# Patient Record
Sex: Male | Born: 1935 | Race: White | Hispanic: No | Marital: Married | Smoking: Never smoker
Health system: Southern US, Community
[De-identification: ages and names within clinical notes are randomized; demographics above are authoritative.]

## PROBLEM LIST (undated history)

## (undated) DIAGNOSIS — J45909 Unspecified asthma, uncomplicated: Secondary | ICD-10-CM

## (undated) DIAGNOSIS — R0602 Shortness of breath: Secondary | ICD-10-CM

## (undated) DIAGNOSIS — I219 Acute myocardial infarction, unspecified: Secondary | ICD-10-CM

## (undated) DIAGNOSIS — I509 Heart failure, unspecified: Secondary | ICD-10-CM

## (undated) DIAGNOSIS — E785 Hyperlipidemia, unspecified: Secondary | ICD-10-CM

## (undated) DIAGNOSIS — I2510 Atherosclerotic heart disease of native coronary artery without angina pectoris: Secondary | ICD-10-CM

## (undated) DIAGNOSIS — J189 Pneumonia, unspecified organism: Secondary | ICD-10-CM

## (undated) DIAGNOSIS — H269 Unspecified cataract: Secondary | ICD-10-CM

## (undated) DIAGNOSIS — T7840XA Allergy, unspecified, initial encounter: Secondary | ICD-10-CM

## (undated) DIAGNOSIS — L719 Rosacea, unspecified: Secondary | ICD-10-CM

## (undated) DIAGNOSIS — M1990 Unspecified osteoarthritis, unspecified site: Secondary | ICD-10-CM

## (undated) HISTORY — DX: Allergy, unspecified, initial encounter: T78.40XA

## (undated) HISTORY — PX: APPENDECTOMY: SHX54

## (undated) HISTORY — DX: Unspecified cataract: H26.9

## (undated) HISTORY — DX: Heart failure, unspecified: I50.9

## (undated) HISTORY — DX: Atherosclerotic heart disease of native coronary artery without angina pectoris: I25.10

## (undated) HISTORY — DX: Hyperlipidemia, unspecified: E78.5

## (undated) HISTORY — DX: Unspecified asthma, uncomplicated: J45.909

## (undated) HISTORY — PX: OTHER SURGICAL HISTORY: SHX169

## (undated) HISTORY — PX: EYE SURGERY: SHX253

---

## 1998-11-30 ENCOUNTER — Ambulatory Visit (HOSPITAL_COMMUNITY): Admission: RE | Admit: 1998-11-30 | Discharge: 1998-11-30 | Payer: Self-pay | Admitting: Gastroenterology

## 2004-01-13 ENCOUNTER — Ambulatory Visit (HOSPITAL_COMMUNITY): Admission: RE | Admit: 2004-01-13 | Discharge: 2004-01-13 | Payer: Self-pay | Admitting: Gastroenterology

## 2004-01-13 ENCOUNTER — Encounter (INDEPENDENT_AMBULATORY_CARE_PROVIDER_SITE_OTHER): Payer: Self-pay | Admitting: Specialist

## 2007-12-02 ENCOUNTER — Inpatient Hospital Stay (HOSPITAL_COMMUNITY): Admission: EM | Admit: 2007-12-02 | Discharge: 2007-12-05 | Payer: Self-pay | Admitting: Emergency Medicine

## 2007-12-16 HISTORY — PX: OTHER SURGICAL HISTORY: SHX169

## 2007-12-22 ENCOUNTER — Emergency Department (HOSPITAL_COMMUNITY): Admission: EM | Admit: 2007-12-22 | Discharge: 2007-12-22 | Payer: Self-pay | Admitting: Emergency Medicine

## 2007-12-24 ENCOUNTER — Inpatient Hospital Stay (HOSPITAL_BASED_OUTPATIENT_CLINIC_OR_DEPARTMENT_OTHER): Admission: RE | Admit: 2007-12-24 | Discharge: 2007-12-24 | Payer: Self-pay | Admitting: Cardiovascular Disease

## 2007-12-27 HISTORY — PX: US ECHOCARDIOGRAPHY: HXRAD669

## 2009-02-23 HISTORY — PX: US ECHOCARDIOGRAPHY: HXRAD669

## 2009-11-14 DIAGNOSIS — I509 Heart failure, unspecified: Secondary | ICD-10-CM

## 2009-11-14 HISTORY — PX: ELECTROCARDIOGRAM: SHX264

## 2009-11-14 HISTORY — DX: Heart failure, unspecified: I50.9

## 2009-11-14 HISTORY — PX: CARDIAC CATHETERIZATION: SHX172

## 2009-12-03 ENCOUNTER — Ambulatory Visit (HOSPITAL_COMMUNITY): Admission: RE | Admit: 2009-12-03 | Discharge: 2009-12-03 | Payer: Self-pay | Admitting: Cardiovascular Disease

## 2010-07-06 ENCOUNTER — Ambulatory Visit
Admission: RE | Admit: 2010-07-06 | Discharge: 2010-07-06 | Payer: Self-pay | Source: Home / Self Care | Attending: Specialist | Admitting: Specialist

## 2010-07-22 ENCOUNTER — Ambulatory Visit: Payer: Self-pay | Admitting: Cardiovascular Disease

## 2010-07-27 ENCOUNTER — Ambulatory Visit
Admission: RE | Admit: 2010-07-27 | Discharge: 2010-07-27 | Payer: Self-pay | Source: Home / Self Care | Attending: Specialist | Admitting: Specialist

## 2010-08-01 LAB — POCT I-STAT 4, (NA,K, GLUC, HGB,HCT)
Glucose, Bld: 97 mg/dL (ref 70–99)
HCT: 44 % (ref 39.0–52.0)
Hemoglobin: 15 g/dL (ref 13.0–17.0)
Potassium: 4.4 mEq/L (ref 3.5–5.1)
Sodium: 140 mEq/L (ref 135–145)

## 2010-09-26 LAB — POCT I-STAT, CHEM 8
BUN: 28 mg/dL — ABNORMAL HIGH (ref 6–23)
Calcium, Ion: 1.26 mmol/L (ref 1.12–1.32)
Chloride: 103 mEq/L (ref 96–112)
Creatinine, Ser: 1 mg/dL (ref 0.4–1.5)
Glucose, Bld: 97 mg/dL (ref 70–99)
HCT: 46 % (ref 39.0–52.0)
Hemoglobin: 15.6 g/dL (ref 13.0–17.0)
Potassium: 4.9 mEq/L (ref 3.5–5.1)
Sodium: 137 mEq/L (ref 135–145)
TCO2: 28 mmol/L (ref 0–100)

## 2010-10-26 ENCOUNTER — Encounter: Payer: Self-pay | Admitting: Cardiovascular Disease

## 2010-10-27 ENCOUNTER — Other Ambulatory Visit: Payer: Self-pay | Admitting: Cardiovascular Disease

## 2010-10-27 ENCOUNTER — Encounter: Payer: Self-pay | Admitting: Cardiovascular Disease

## 2010-10-27 ENCOUNTER — Ambulatory Visit (INDEPENDENT_AMBULATORY_CARE_PROVIDER_SITE_OTHER): Payer: Medicare Other | Admitting: Cardiovascular Disease

## 2010-10-27 VITALS — BP 122/68 | HR 60 | Resp 18 | Wt 197.8 lb

## 2010-10-27 DIAGNOSIS — I509 Heart failure, unspecified: Secondary | ICD-10-CM | POA: Insufficient documentation

## 2010-10-27 DIAGNOSIS — I2510 Atherosclerotic heart disease of native coronary artery without angina pectoris: Secondary | ICD-10-CM | POA: Insufficient documentation

## 2010-10-27 DIAGNOSIS — E785 Hyperlipidemia, unspecified: Secondary | ICD-10-CM | POA: Insufficient documentation

## 2010-10-27 MED ORDER — ATORVASTATIN CALCIUM 40 MG PO TABS: 40.0000 mg | ORAL_TABLET | Freq: Every day | ORAL | Status: DC

## 2010-10-27 NOTE — Patient Instructions (Signed)
3 months visit for blood work ( lipids, liver enzymes, and electrolytes) 6 months for office visit.

## 2010-10-27 NOTE — Assessment & Plan Note (Signed)
Jeffrey Stewart is doing very well from a cardiac standpoint. I'm pleased that she's not having any episodes of angina.

## 2010-10-27 NOTE — Assessment & Plan Note (Signed)
He'll continue with his same medications including carvedilol and lisinopril. I'll see him again in 6 months for followup visit.

## 2010-10-27 NOTE — Telephone Encounter (Signed)
meds refilled by fax to [facility]. Carvedilol, lisinopril, atorvastatin. Alfonso Ramus RN

## 2010-10-27 NOTE — Progress Notes (Signed)
Jeffrey Stewart Date of Birth  [birth year] [facility] / [facility] [address]     [address] [place], [place]  [address] [phone]  Fax  [phone]   History of Present Illness:  Jeffrey Stewart is an elderly gentleman with a history of mild to moderate coronary artery disease and very mild congestive heart failure. He has not had any episodes of chest pain or shortness of breath. His ejection fraction is around 45-50%. He denies any syncope or presyncope. He also has a history of hyperlipidemia.  Current Outpatient Prescriptions on File Prior to Visit  Medication Sig Dispense Refill  . acetaminophen (TYLENOL) 325 MG tablet Take 650 mg by mouth every 6 (six) hours as needed.        Marland Kitchen aspirin 325 MG tablet Take 325 mg by mouth daily.        . carvedilol (COREG) 12.5 MG tablet Take 12.5 mg by mouth 2 (two) times daily with a meal.        . cetirizine (ZYRTEC) 10 MG tablet Take 10 mg by mouth as needed.        . DiphenhydrAMINE HCl (BENADRYL PO) Take by mouth as needed.        . fish oil-omega-3 fatty acids 1000 MG capsule Take 3 g by mouth 3 (three) times daily.        Marland Kitchen GLUCOSAMINE PO Take 1 tablet by mouth 3 (three) times daily.       Marland Kitchen lisinopril (PRINIVIL,ZESTRIL) 5 MG tablet Take 5 mg by mouth daily.        . mometasone (ASMANEX) 220 MCG/INH inhaler Inhale 1 puff into the lungs daily.        . nitroGLYCERIN (NITROSTAT) 0.4 MG SL tablet Place 0.4 mg under the tongue every 5 (five) minutes as needed.        . simvastatin (ZOCOR) 40 MG tablet Take 40 mg by mouth at bedtime.        Marland Kitchen tetracycline (ACHROMYCIN,SUMYCIN) 500 MG capsule Take 500 mg by mouth 2 (two) times daily.          No Known Allergies  Past Medical History  Diagnosis Date  . Coronary artery disease   . CHF (congestive heart failure) 11/2009    EF45-50%    Past Surgical History  Procedure Date  . Cardiac catheterization 11/2009    mild / mod cad,  . Last echo 02/2009   . Last nuc 2009   .  Electrocardiogram 11/2009    RBBB    History  Smoking status  . Never Smoker   Smokeless tobacco  . Not on file    History  Alcohol Use No    History reviewed. No pertinent family history.  Reviw of Systems:  Reviewed in the history of present illness. All other systems are negative. Physical Exam: BP 122/68  Pulse 60  Resp 18  Wt 197 lb 12.8 oz (89.721 kg) The patient is alert and oriented x 3.  The mood and affect are normal.  The skin is warm and dry.  Color is normal.  The HEENT exam reveals that the sclera are nonicteric.  The mucous membranes are moist.  The carotids are 2+ without bruits.  There is no thyromegaly.  There is no JVD.  The lungs are clear.  The chest wall is non tender.  The heart exam reveals a regular rate with a normal S1 and S2.  There are no murmurs, gallops, or rubs.  The  PMI is not displaced.   Abdominal exam reveals good bowel sounds.  There is no guarding or rebound.  There is no hepatosplenomegaly or tenderness.  There are no masses.  Exam of the legs reveal no clubbing, cyanosis, or edema.  The legs are without rashes.  The distal pulses are intact.  Cranial nerves II - XII are intact.  Motor and sensory functions are intact.  The gait is normal.  Assessment / Plan:

## 2010-10-27 NOTE — Assessment & Plan Note (Signed)
I prefer to have him on atorvastatin 40 mg a day. We'll discontinue the simvastatin therapy and we will write him a new prescription for atorvastatin. We'll check his lipids, basic metabolic profile, and hepatic profile in 3 months.

## 2010-11-29 NOTE — H&P (Signed)
NAMEGEE, HABIG NO.:  [id number]   MEDICAL RECORD NO.:  [id number]          PATIENT TYPE:  INP   LOCATION:  4714                         FACILITY:  [facility]   PHYSICIAN:  Della Goo, M.D. DATE OF BIRTH:  [birth year]   DATE OF ADMISSION:  12/01/2007  DATE OF DISCHARGE:                              HISTORY & PHYSICAL   PRIMARY CARE PHYSICIAN:  [facility], Dr. Larina Bras.   CHIEF COMPLAINTS:  Shortness of breath and chest pain.   HISTORY OF PRESENT ILLNESS:  This is a 76 year old male who presents to  the emergency department with complaints of increasing shortness of  breath, fever, chills and pleuritic chest pain since the afternoon.  He  reports being seen by his primary care physician earlier in the week  four days ago secondary to symptoms he had been having and was placed on  antibiotic therapy of Augmentin with meclizine therapy for treatment of  an inner ear infection along with mild bronchitis.  The patient denies  having any cough or cough production.  He also reports being on  antibiotic therapy chronically with tetracycline for his rosacea.   PAST MEDICAL HISTORY:  Significant for hyperlipidemia and rosacea.   PAST SURGICAL HISTORY:  History of an appendectomy.   His medications at this time include tetracycline daily.  Also, he had  been placed on Augmentin 875/125 mg 1 p.o. b.i.d. and meclizine therapy  p.r.n. dizziness.  He also takes aspirin therapy and Zocor.   ALLERGIES:  No known drug allergies.   SOCIAL HISTORY:  The patient is married.  He is a nonsmoker, nondrinker.   FAMILY HISTORY:  Noncontributory.   REVIEW OF SYSTEMS:  Pertinents are mentioned above.   PHYSICAL EXAMINATION FINDINGS:  This is a 76 year old, well-developed  male in no acute distress.  His temperature is 100.1, blood pressure  129/71, heart rate 112 and respirations 18.  His O2 saturations are 94-  96% on room air.  HEENT EXAMINATION:   Normocephalic, atraumatic.  Pupils are equally  round, reactive to light.  Extraocular muscles are intact.  Funduscopic  benign.  Oropharynx is clear.  NECK:  Is supple, full range of motion.  No thyromegaly, adenopathy, or  jugular venous distention.  CARDIOVASCULAR:  Regular rate and rhythm.  Mild tachycardia.  No  murmurs, gallops or rubs.  LUNGS:  Clear to auscultation bilaterally.  ABDOMEN:  Positive bowel sounds, soft, nontender, nondistended.  EXTREMITIES:  Without cyanosis, clubbing or edema.  NEUROLOGIC EXAMINATION:  Nonfocal.   LABORATORY STUDIES:  White blood cell count 8.1, hemoglobin 14.4,  hematocrit 42.2, platelets 118, neutrophils 77% lymphocytes 11%.  Sodium  136, potassium 4.2, chloride 101, bicarb 25, BUN 14, creatinine 1.06 and  glucose 138.  Urinalysis with moderate hemoglobin.  Chest x-ray reveals  bibasilar atelectasis and a questionable pulmonary nodule along with  right pleural thickening.   ASSESSMENT:  A 76 year old male being admitted with:  1. Atypical pneumonia.  2. Pleuritic chest pain.  3. Mild hypoxia.  4. Pulmonary nodule on chest x-ray.   PLAN:  The patient will be admitted to  the telemetry area for cardiac  monitoring, and cardiac enzymes will be started.  The patient will be  started on IV antibiotic therapy of Rocephin and azithromycin.  Nebulizer treatments have also been ordered.  A CT angiogram of the  chest will be ordered now to evaluate for pulmonary embolism secondary  to pleuritic chest pain but also to further evaluate the pulmonary  nodule.  DVT and GI prophylaxis have been ordered.      Della Goo, M.D.  Electronically Signed     HJ/MEDQ  D:  12/02/2007  T:  12/02/2007  Job:  [id number]   cc:   Gloriajean Dell. Andrey Campanile, M.D.

## 2010-11-29 NOTE — Cardiovascular Report (Signed)
NAMETYREAK, REAGLE NO.:  [id number]   MEDICAL RECORD NO.:  [id number]          PATIENT TYPE:  OIB   LOCATION:  1967                         FACILITY:  [facility]   PHYSICIAN:  Vesta Mixer, M.D. DATE OF BIRTH:  Sep 04, 1934   DATE OF PROCEDURE:  DATE OF DISCHARGE:  12/24/2007                            CARDIAC CATHETERIZATION   Jeffrey Stewart is a 76 year old gentleman with a history of moderate  coronary artery disease and congestive heart failure.  He was recently  seen for some episodes of chest pain and shortness breath.  He presented  to the emergency room and ruled out for myocardial infarction.  He is  scheduled for heart catheterization for further evaluation.   The procedure was left heart catheterization with coronary angiography.   The right femoral artery was easily cannulated using modified Seldinger  technique.   HEMODYNAMICS:  LV pressure is 111/16 with an aortic pressure of 116/36.   Angiography left main, the left main is relatively normal.   The left anterior descending artery is moderately calcified.  There is a  mid 40%-50% stenosis right at the takeoff of the first diagonal artery.  The remaining mid and distal LAD had minor luminal irregularities.   The first diagonal artery has a 50% stenosis at its origin.   The left circumflex artery is large and is dominant.  There are minor  luminal irregularities. The obtuse marginal arteries in the  posterolateral branches are normal.  The posterior descending artery has  an 80-90% stenosis.   The ramus intermediate vessel has minor luminal irregularities.   The right coronary artery is small, it is nondominant.   The left ventriculogram was performed in a 30 RAO position.  Reveals a  moderately reduced left ventricular systolic function.  Ejection  fraction is around 35-40%.  There is hypokinesis of the anterior wall,  apex, and inferoapical wall.  The aortic root appears to be moderately  dilated.   COMPLICATIONS:  None.   CONCLUSION:  1. Moderate coronary artery disease.  He has CAD involving the mid-LAD      in the proximal posterior descending artery (from a dominant left      circumflex).  He certainly may be having some ischemia from this      posterior      descending artery stenosis, although it certainly is not the cause      of his moderately reduced left ventricular systolic function.  We      will evaluate him for other problems.  We will need to get an      echocardiogram to evaluate his valvular function.  We will consider      doing angioplasty of the posterior descending artery.           ______________________________  Vesta Mixer, M.D.     PJN/MEDQ  D:  12/24/2007  T:  12/25/2007  Job:  [id number]   cc:   Ace Gins, MD

## 2010-11-29 NOTE — Discharge Summary (Signed)
Jeffrey Stewart, Jeffrey Stewart NO.:  [id number]   MEDICAL RECORD NO.:  [id number]           PATIENT TYPE:   LOCATION:                                 FACILITY:   PHYSICIAN:  Hettie Holstein, D.O.    DATE OF BIRTH:  1934/08/17   DATE OF ADMISSION:  DATE OF DISCHARGE:                               DISCHARGE SUMMARY   PRIMARY CARE PHYSICIAN:  Ace Gins, MD.   FINAL DIAGNOSES:  1. Community-acquired pneumonia status post chest x-ray and CT      angiogram of the chest revealing some focal consolidation in the      left lung base.  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  He had no evidence of central pulmonary      emboli on the study performed, though was not an optimal      examination.  I discussed this further with Dr. Denny Levy of      radiology.  2. Pleuritic chest discomfort, resolved, felt to be related to      community-acquired pneumonia, anticipate completion of an 8-day      course and close follow up in the outpatient setting.  3. Abnormal EKG on admission.  This was followed.  His cardiac markers      were negative and not indicate an acute ischemic event.  He did      have a right bundle branch block.  At this juncture, it is      recommended that he undergo an outpatient stress test for further      risk gratification.  4. Mild thrombocytopenia, felt to be related to acute infectious      process and recommend follow up within the next few weeks to assure      resolution through primary care physician.   LABORATORY DATA:  Basic metabolic panel revealed sodium 137, potassium  4.3, BUN 8, and creatinine 1.07.  His troponins were negative.  WBC of  5.0, hemoglobin 13.4, MCV of 97.1, and a platelet count of 146.   MEDICATIONS ON DISCHARGE:  Jeffrey Stewart is being discharged to complete a  day course of antibiotics for community-acquired pneumonia.  He will be  transitioned from IV Zithromax and Rocephin to p.o. Avelox 400 mg  p.o.  daily.  He can resume his home medications including Zocor 40 mg daily,  tetracycline for rosacea which he takes twice daily, aspirin 325 mg  daily, Tylenol as needed, glucosamine he can resume as he was prior to  arrival, and amoxicillin he can discontinue.   DISPOSITION:  Jeffrey Stewart is improved in stable medical condition,  currently pain free, ambulating in the halls without difficulty.  He is  in sinus rhythm and hemodynamically stable for follow up with his  primary care physician within 1 week.  History of presenting illness for  full details, please refer to the H&P as dictated by Dr. Della Goo; however briefly, Jeffrey Stewart is a 76 year old male presented to  the emergency department with complaints of increasing shortness of  breath, fever, chills, and pleuritic pain.  He reported being seen by  his primary MD earlier and was placed on Augmentin and meclizine.  In  any event, he denied any cough, fever, or chills.  He was noted to be  chronically on tetracycline for rosacea.  In the emergency department,  chest x-ray revealed some bibasilar atelectasis and some right pleural  thickening.  Subsequently, he underwent CT scan that revealed some  consolidation of the left lung felt to be by community-acquired  pneumonia.  He was placed on IV Zithromax and  Rocephin with resolution of his symptoms.  Cardiac markers were not  indicative of any acute ischemic event.  He is ambulating without  difficulty maintaining O2 saturations greater than 92% with activity.  He was asked to follow up with his primary MD.      Hettie Holstein, D.O.  Electronically Signed     ESS/MEDQ  D:  12/05/2007  T:  12/05/2007  Job:  [id number]   cc:   Ace Gins, MD

## 2010-11-29 NOTE — H&P (Signed)
NAMECHETT, TANIGUCHI NO.:  [id number]   MEDICAL RECORD NO.:  [id number]          PATIENT TYPE:   LOCATION:                                 FACILITY:   PHYSICIAN:  Vesta Mixer, M.D. DATE OF BIRTH:  1934-10-01   DATE OF ADMISSION:  DATE OF DISCHARGE:                              HISTORY & PHYSICAL   Jeffrey Stewart is an elderly gentleman with a history of an abnormal EKG.  He is admitted now for heart catheterization.   Jeffrey Stewart was seen previously for a stress test.  He has had some  episodes of chest tightness and chest fullness.  He has also had a  pleuritic chest pain.  He was just getting over pneumonia and in fact  was hospitalized for pneumonia last month.  He was found to have an  abnormal EKG and is referred here for further evaluation.  A stress  Cardiolite study revealed an anteroapical defect.  He was also found to  have moderately reduced left ventricular systolic function with an EF of  33%.  He is now referred for heart catheterization for further  evaluation.   CURRENT MEDICATIONS:  1. Aspirin 325 mg a day.  2. Zyrtec once a day.  3. Glucosamine once a day.  4. Simvastatin 40 mg a day.  5. Tetracycline once a day.  6. Tylenol as needed.   ALLERGIES:  None.   PAST MEDICAL HISTORY:  1. History of hyperlipidemia.  2. Allergies.   SOCIAL HISTORY:  The patient is a nonsmoker.   FAMILY HISTORY:  Noncontributory.   REVIEW OF SYSTEMS:  Is reviewed and is essentially negative except as  noted in the HPI.   PHYSICAL EXAMINATION:  GENERAL:  He is a middle-aged gentleman in no  acute distress.  He is alert and oriented x3, and his mood and affect  are normal.  VITAL SIGNS:  His weight is 203, blood pressure is 100/60  with a heart rate of 104.  HEENT:  Reveals 2+ carotids.  He has no bruits, no JVD, no thyromegaly.  LUNGS:  Clear to auscultation.  HEART:  Regular rate, S1, S2.  ABDOMEN:  Reveals good bowel sounds and is nontender.  EXTREMITIES:  There is no clubbing, cyanosis, or edema.  NEUROLOGICAL:  Nonfocal.   Jeffrey Stewart presents with some episodes of chest discomfort.  He has an  abnormal stress Cardiolite study, which is somewhat worrisome.  His left  ventricular systolic function is moderately reduced.  We have elected to  perform an outpatient heart catheterization.  We have discussed the  risks, benefits, and options of heart catheterization.  He understands  and agrees to proceed.           ______________________________  Vesta Mixer, M.D.     PJN/MEDQ  D:  12/18/2007  T:  12/19/2007  Job:  [id number]   cc:   Ace Gins, MD

## 2010-12-02 NOTE — Op Note (Signed)
Jeffrey Stewart, Jeffrey Stewart                          ACCOUNT NO.:  [id number]   MEDICAL RECORD NO.:  [id number]                   PATIENT TYPE:  AMB   LOCATION:  ENDO                                 [facility]ACILITY:  [facility]   PHYSICIAN:  Petra Kuba, M.D.                 DATE OF BIRTH:  Feb 23, 1935   DATE OF PROCEDURE:  01/13/2004  DATE OF DISCHARGE:                                 OPERATIVE REPORT   PROCEDURE:  Colonoscopy with polypectomy.   INDICATION:  Patient with history of colon polyps, due for colonic  screening.  Consent was signed after risks, benefits, methods, options  thoroughly discussed multiple times in the past.   MEDICINES USED:  1. Demerol 90.  2. Versed 8.5.   PROCEDURE:  Rectal inspection is pertinent for external hemorrhoids.  Digital exam negative.  Video colonoscope was inserted, easily advanced  around the colon to the cecum.  This does not require any abdominal pressure  or any position changes.  No obvious abnormality was seen on insertion.  The  cecum was identified by the appendiceal orifice and the ileocecal valve.  The scope was slowly withdrawn.  Prep was adequate.  There was some liquid  stool that required washing and sectioning on slow withdrawal through the  colon.  The cecum, ascending, transverse and majority of the descending was  normal.  At the proximal level of the sigmoid, descending junction a tiny  polyp was seen and was cold biopsied x2.  The scope was further withdrawn  back to the rectum and a small polyp was seen, possibly recurrence of  residual polyp from the previous one biopsied, since there seemed to be a  white scar on the colon just next to this, possibly from previous  polypectomy and this polyp was snared, electrocautery applied and the polyp  was suctioned through the scope and collected in the trap.  There was no  obvious residual polypoid tissue seen but a nice white coagulum at the base.  Anorectal pull through on retroflexion  confirmed the hemorrhoids.  The scope  was reinserted a short ways up the left side of the colon, air was  suctioned, scope removed.  The patient tolerated the procedure well.  There  was no obvious immediate complication.   ENDOSCOPIC DIAGNOSES:  1. Internal, external hemorrhoids.  2. Small rectal polyp, snared.  3. Tiny proximal sigmoid polyp cold biopsied.  4. Otherwise within normal limits to the cecum.   PLAN:  1. Await pathology to determine future colonic screening.  2. Happy to see him back p.r.n.  3. Otherwise, return care to Dr. Arlyce Dice for the customary healthcare     maintenance to include yearly rectals and guaiacs.  Petra Kuba, M.D.    MEM/MEDQ  D:  01/13/2004  T:  01/13/2004  Job:  [id number]   cc:   Teena Irani. Arlyce Dice, M.D.  [address]  [place]  [place] [address]  Fax: [phone]

## 2010-12-18 ENCOUNTER — Emergency Department (HOSPITAL_COMMUNITY)
Admission: EM | Admit: 2010-12-18 | Discharge: 2010-12-19 | Disposition: A | Discharge status: Home / Self Care | Payer: Medicare Other | Attending: Emergency Medicine | Admitting: Emergency Medicine

## 2010-12-18 DIAGNOSIS — S7010XA Contusion of unspecified thigh, initial encounter: Secondary | ICD-10-CM | POA: Insufficient documentation

## 2010-12-18 DIAGNOSIS — Z79899 Other long term (current) drug therapy: Secondary | ICD-10-CM | POA: Insufficient documentation

## 2010-12-18 DIAGNOSIS — Y929 Unspecified place or not applicable: Secondary | ICD-10-CM | POA: Insufficient documentation

## 2010-12-18 DIAGNOSIS — IMO0001 Reserved for inherently not codable concepts without codable children: Secondary | ICD-10-CM | POA: Insufficient documentation

## 2010-12-18 DIAGNOSIS — I998 Other disorder of circulatory system: Secondary | ICD-10-CM | POA: Insufficient documentation

## 2010-12-18 DIAGNOSIS — E785 Hyperlipidemia, unspecified: Secondary | ICD-10-CM | POA: Insufficient documentation

## 2010-12-18 DIAGNOSIS — S8010XA Contusion of unspecified lower leg, initial encounter: Secondary | ICD-10-CM | POA: Insufficient documentation

## 2010-12-18 DIAGNOSIS — M25559 Pain in unspecified hip: Secondary | ICD-10-CM | POA: Insufficient documentation

## 2010-12-18 DIAGNOSIS — R109 Unspecified abdominal pain: Secondary | ICD-10-CM | POA: Insufficient documentation

## 2010-12-18 DIAGNOSIS — W138XXA Fall from, out of or through other building or structure, initial encounter: Secondary | ICD-10-CM | POA: Insufficient documentation

## 2010-12-18 DIAGNOSIS — R609 Edema, unspecified: Secondary | ICD-10-CM | POA: Insufficient documentation

## 2010-12-19 ENCOUNTER — Emergency Department (HOSPITAL_COMMUNITY): Payer: Medicare Other

## 2011-01-20 ENCOUNTER — Other Ambulatory Visit: Payer: Self-pay | Admitting: *Deleted

## 2011-01-20 DIAGNOSIS — E785 Hyperlipidemia, unspecified: Secondary | ICD-10-CM

## 2011-01-26 ENCOUNTER — Other Ambulatory Visit (INDEPENDENT_AMBULATORY_CARE_PROVIDER_SITE_OTHER): Payer: Medicare Other | Admitting: *Deleted

## 2011-01-26 DIAGNOSIS — E785 Hyperlipidemia, unspecified: Secondary | ICD-10-CM

## 2011-01-26 LAB — HEPATIC FUNCTION PANEL
ALT: 16 U/L (ref 0–53)
AST: 18 U/L (ref 0–37)
Albumin: 4.5 g/dL (ref 3.5–5.2)
Alkaline Phosphatase: 63 U/L (ref 39–117)
Bilirubin, Direct: 0.2 mg/dL (ref 0.0–0.3)
Total Bilirubin: 1.2 mg/dL (ref 0.3–1.2)
Total Protein: 6.7 g/dL (ref 6.0–8.3)

## 2011-01-26 LAB — BASIC METABOLIC PANEL
BUN: 19 mg/dL (ref 6–23)
CO2: 29 mEq/L (ref 19–32)
Calcium: 9.2 mg/dL (ref 8.4–10.5)
Chloride: 98 mEq/L (ref 96–112)
Creatinine, Ser: 0.9 mg/dL (ref 0.4–1.5)
GFR: 84.45 mL/min (ref >60.00)
Glucose, Bld: 101 mg/dL — ABNORMAL HIGH (ref 70–99)
Potassium: 4.1 mEq/L (ref 3.5–5.1)
Sodium: 138 mEq/L (ref 135–145)

## 2011-01-26 LAB — LIPID PANEL
Cholesterol: 107 mg/dL (ref 0–200)
HDL: 28.9 mg/dL — ABNORMAL LOW (ref >39.00)
LDL Cholesterol: 53 mg/dL (ref 0–99)
Total CHOL/HDL Ratio: 4
Triglycerides: 126 mg/dL (ref 0.0–149.0)
VLDL: 25.2 mg/dL (ref 0.0–40.0)

## 2011-01-27 ENCOUNTER — Telehealth: Payer: Self-pay | Admitting: Cardiovascular Disease

## 2011-01-27 NOTE — Progress Notes (Signed)
Pt to call back/ msg left.

## 2011-01-27 NOTE — Telephone Encounter (Signed)
Called and mailed results.Pt verbalized understanding. Alfonso Ramus RN

## 2011-01-27 NOTE — Telephone Encounter (Signed)
Message copied by Antony Odea on Fri Jan 27, 2011  2:50 PM ------      Message from: [place], [place]      Created: Fri Jan 27, 2011  8:23 AM       HDL is still low.  Cont. meds . Increase exercise.

## 2011-01-27 NOTE — Telephone Encounter (Signed)
Returned your call about his recent blood work and would like you to send him a copy of the bloodwork. Please call back on his cell phone [phone].

## 2011-03-13 ENCOUNTER — Telehealth: Payer: Self-pay | Admitting: Cardiovascular Disease

## 2011-03-13 MED ORDER — NITROGLYCERIN 0.4 MG SL SUBL: 0.4000 mg | SUBLINGUAL_TABLET | SUBLINGUAL | Status: DC | PRN

## 2011-03-13 NOTE — Telephone Encounter (Signed)
Pt is on vacation in [place], [place] and he has lost his nitroglycerin tablets please [facility] [place], [place] [phone]

## 2011-03-13 NOTE — Telephone Encounter (Signed)
Patient request refill. done

## 2011-04-12 LAB — DIFFERENTIAL
Basophils Relative: 0
Eosinophils Absolute: 0.1
Monocytes Absolute: 0.9
Monocytes Relative: 11
Neutrophils Relative %: 77

## 2011-04-12 LAB — COMPREHENSIVE METABOLIC PANEL
ALT: 12
Alkaline Phosphatase: 57
CO2: 25
Chloride: 101
Glucose, Bld: 138 — ABNORMAL HIGH
Potassium: 4.2
Sodium: 136
Total Protein: 6.3

## 2011-04-12 LAB — CK TOTAL AND CKMB (NOT AT ARMC)
Relative Index: INVALID
Relative Index: INVALID
Total CK: 40

## 2011-04-12 LAB — BASIC METABOLIC PANEL
BUN: 8
CO2: 28
Chloride: 103
Creatinine, Ser: 1.07

## 2011-04-12 LAB — CULTURE, BLOOD (ROUTINE X 2): Culture: NO GROWTH

## 2011-04-12 LAB — URINE CULTURE
Colony Count: NO GROWTH
Culture: NO GROWTH

## 2011-04-12 LAB — CBC
HCT: 42.2
Hemoglobin: 14.4
MCHC: 33.9
MCHC: 34.2
MCV: 96.4
MCV: 97.1
Platelets: 118 — ABNORMAL LOW
Platelets: 146 — ABNORMAL LOW
RBC: 4.37
RDW: 12.7
WBC: 8.1

## 2011-04-12 LAB — URINALYSIS, ROUTINE W REFLEX MICROSCOPIC
Glucose, UA: NEGATIVE
Ketones, ur: NEGATIVE
Leukocytes, UA: NEGATIVE
Nitrite: NEGATIVE
Protein, ur: NEGATIVE
Specific Gravity, Urine: 1.021
Urobilinogen, UA: 2 — ABNORMAL HIGH
pH: 6

## 2011-04-12 LAB — URINE MICROSCOPIC-ADD ON

## 2011-04-13 LAB — CBC
HCT: 48.2
Hemoglobin: 16.6
MCHC: 34.5
RDW: 12.2

## 2011-04-13 LAB — POCT CARDIAC MARKERS
Myoglobin, poc: 49.5
Troponin i, poc: <0.05

## 2011-04-13 LAB — CULTURE, BLOOD (ROUTINE X 2): Culture: NO GROWTH

## 2011-04-13 LAB — POCT I-STAT, CHEM 8
Creatinine, Ser: 1
Hemoglobin: 17
Potassium: 3.7
Sodium: 138

## 2011-04-13 LAB — DIFFERENTIAL
Basophils Absolute: 0
Basophils Relative: 0
Eosinophils Relative: 4
Lymphocytes Relative: 16
Monocytes Absolute: 1.2 — ABNORMAL HIGH

## 2011-05-03 ENCOUNTER — Encounter: Payer: Self-pay | Admitting: Cardiovascular Disease

## 2011-05-08 ENCOUNTER — Ambulatory Visit (INDEPENDENT_AMBULATORY_CARE_PROVIDER_SITE_OTHER): Payer: Medicare Other | Admitting: Cardiovascular Disease

## 2011-05-08 ENCOUNTER — Encounter: Payer: Self-pay | Admitting: Cardiovascular Disease

## 2011-05-08 DIAGNOSIS — I2510 Atherosclerotic heart disease of native coronary artery without angina pectoris: Secondary | ICD-10-CM

## 2011-05-08 DIAGNOSIS — I509 Heart failure, unspecified: Secondary | ICD-10-CM

## 2011-05-08 DIAGNOSIS — I4510 Unspecified right bundle-branch block: Secondary | ICD-10-CM

## 2011-05-08 NOTE — Progress Notes (Signed)
Rhona Raider Date of Birth  [birth year] [facility] [address]    [address] [place], [place]  [address] [phone]  Fax  [phone]  History of Present Illness:  76 yo gentleman with a hx of mild CHF.    He's not had any significant episodes of chest pain or shortness breath.  He walks occasionally.  Current Outpatient Prescriptions on File Prior to Visit  Medication Sig Dispense Refill  . acetaminophen (TYLENOL) 325 MG tablet Take 650 mg by mouth every 6 (six) hours as needed.        Marland Kitchen aspirin 325 MG tablet Take 325 mg by mouth daily.        Marland Kitchen atorvastatin (LIPITOR) 40 MG tablet Take 1 tablet (40 mg total) by mouth daily.  30 tablet  11  . carvedilol (COREG) 12.5 MG tablet Take 12.5 mg by mouth 2 (two) times daily with a meal.        . cetirizine (ZYRTEC) 10 MG tablet Take 10 mg by mouth as needed.        . fish oil-omega-3 fatty acids 1000 MG capsule Take 3 g by mouth 3 (three) times daily.        Marland Kitchen GLUCOSAMINE PO Take 1 tablet by mouth 3 (three) times daily.       Marland Kitchen lisinopril (PRINIVIL,ZESTRIL) 5 MG tablet Take 5 mg by mouth daily.        . mometasone (ASMANEX) 220 MCG/INH inhaler Inhale 1 puff into the lungs daily.        . nitroGLYCERIN (NITROSTAT) 0.4 MG SL tablet Place 1 tablet (0.4 mg total) under the tongue every 5 (five) minutes as needed.  25 tablet  1    Allergies  Allergen Reactions  . Niaspan (Niacin)     Intolerance      Past Medical History  Diagnosis Date  . Coronary artery disease   . CHF (congestive heart failure) 11/2009    EF45-50%  . Hyperlipidemia     Past Surgical History  Procedure Date  . Last echo 02/2009   . Last nuc 2009 12/16/2007    EF 33%. ABNORMAL. 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 Electrocardiogram 11/2009    RBBB  . Cardiac catheterization 11/2009    mild / mod cad,MODERATE TIGHT STENOSIS IN THE DISTAL LEFT CIRCUMFLEX ARTERY  . US echocardiography 02/23/2009    EF 45-50%  . US  echocardiography 12/27/2007    EF 45-50%    History  Smoking status  . Never Smoker   Smokeless tobacco  . Not on file    History  Alcohol Use No    No family history on file.  Reviw of Systems:  Reviewed in the HPI.  All other systems are negative.  Physical Exam: BP 118/72  Pulse 66  Ht 6\' 2"  (1.88 m)  Wt 200 lb 6.4 oz (90.901 kg)  BMI 25.73 kg/m2 The patient is alert and oriented x 3.  The mood and affect are normal.   Skin: warm and dry.  Color is normal.    HEENT:   His neck is supple. There are no carotid bruits. There is no JVD.  Lungs: His lungs are clear.   Heart: Regular rate. There are no murmurs    Abdomen: His abdomen is mildly obese. There is no hepatosplenomegaly.  Extremities:  His no clubbing cyanosis or edema.  Neuro:  Exam is nonfocal. His gait is normal.  ECG: Normal sinus rhythm. He has a right bundle branch block. There are no acute ST or T wave changes.  Assessment / Plan:

## 2011-05-08 NOTE — Assessment & Plan Note (Signed)
His mild coronary artery disease. We'll continue with the same medications.

## 2011-05-08 NOTE — Patient Instructions (Addendum)
Your physician wants you to follow-up in: 6 months  You will receive a reminder letter in the mail two months in advance. If you don't receive a letter, please call our office to schedule the follow-up appointment.  Your physician recommends that you return for a FASTING lipid profile: 6 months   

## 2011-05-08 NOTE — Assessment & Plan Note (Signed)
His right bundle branch block is chronic. We'll continue to follow.

## 2011-05-08 NOTE — Assessment & Plan Note (Signed)
He's not having any significant problems related to congestive heart failure. We'll continue with his same medications. I'll see him again in 6 months.

## 2011-09-13 ENCOUNTER — Encounter: Payer: Self-pay | Admitting: Cardiovascular Disease

## 2011-11-15 ENCOUNTER — Encounter: Payer: Self-pay | Admitting: Cardiovascular Disease

## 2011-11-15 ENCOUNTER — Ambulatory Visit (INDEPENDENT_AMBULATORY_CARE_PROVIDER_SITE_OTHER): Payer: Medicare Other | Admitting: Cardiovascular Disease

## 2011-11-15 VITALS — BP 100/68 | HR 92 | Ht 74.0 in | Wt 208.8 lb

## 2011-11-15 DIAGNOSIS — I2510 Atherosclerotic heart disease of native coronary artery without angina pectoris: Secondary | ICD-10-CM

## 2011-11-15 DIAGNOSIS — I509 Heart failure, unspecified: Secondary | ICD-10-CM

## 2011-11-15 NOTE — Patient Instructions (Signed)
Your physician recommends that you return for a FASTING lipid profile: tomorrow 8:30 - 1:00  Your physician wants you to follow-up in: 6 months You will receive a reminder letter in the mail two months in advance. If you don't receive a letter, please call our office to schedule the follow-up appointment.

## 2011-11-15 NOTE — Assessment & Plan Note (Addendum)
He has  not had any episodes of chest pain. We'll continue the same medications. I've encouraged him to walk on a regular basis.

## 2011-11-15 NOTE — Progress Notes (Signed)
Jeffrey Stewart Date of Birth  01-10-1935 [facility] [address]    [address] [place], [place]  [address] [phone]  Fax  [phone]  History of Present Illness:  77 yo gentleman with a hx of mild CHF.    He's not had any significant episodes of chest pain or shortness breath.  He walks occasionally.  He has been exercising on a regular basis. He works out in his garden and walks on his treadmill in the evenings.  Current Outpatient Prescriptions on File Prior to Visit  Medication Sig Dispense Refill  . acetaminophen (TYLENOL) 325 MG tablet Take 650 mg by mouth every 6 (six) hours as needed.        Marland Kitchen albuterol (PROVENTIL HFA;VENTOLIN HFA) 108 (90 BASE) MCG/ACT inhaler Inhale 1 puff into the lungs every 6 (six) hours as needed.      Marland Kitchen aspirin 325 MG tablet Take 325 mg by mouth daily.        . carvedilol (COREG) 12.5 MG tablet Take 12.5 mg by mouth 2 (two) times daily with a meal.        . cetirizine (ZYRTEC) 10 MG tablet Take 10 mg by mouth as needed.        . fish oil-omega-3 fatty acids 1000 MG capsule Take 3 g by mouth 3 (three) times daily.        . fluticasone (VERAMYST) 27.5 MCG/SPRAY nasal spray Place 2 sprays into the nose daily.      Marland Kitchen GLUCOSAMINE PO Take 1 tablet by mouth 2 (two) times daily.       Marland Kitchen lisinopril (PRINIVIL,ZESTRIL) 5 MG tablet Take 5 mg by mouth daily.        . mometasone (ASMANEX) 220 MCG/INH inhaler Inhale 1 puff into the lungs daily.        . nitroGLYCERIN (NITROSTAT) 0.4 MG SL tablet Place 1 tablet (0.4 mg total) under the tongue every 5 (five) minutes as needed.  25 tablet  1  . atorvastatin (LIPITOR) 40 MG tablet Take 1 tablet (40 mg total) by mouth daily.  30 tablet  11    Allergies  Allergen Reactions  . Niaspan (Niacin)     Intolerance      Past Medical History  Diagnosis Date  . Coronary artery disease   . CHF (congestive heart failure) 11/2009    EF45-50%  . Hyperlipidemia     Past Surgical History  Procedure Date  . Last echo  02/2009   . Last nuc 2009 12/16/2007    EF 33%. ABNORMAL. 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 Electrocardiogram 11/2009    RBBB  . Cardiac catheterization 11/2009    mild / mod cad,MODERATE TIGHT STENOSIS IN THE DISTAL LEFT CIRCUMFLEX ARTERY  . US echocardiography 02/23/2009    EF 45-50%  . US echocardiography 12/27/2007    EF 45-50%    History  Smoking status  . Never Smoker   Smokeless tobacco  . Not on file    History  Alcohol Use No    No family history on file.  Reviw of Systems:  Reviewed in the HPI.  All other systems are negative.  Physical Exam: BP 100/68  Pulse 92  Ht 6\' 2"  (1.88 m)  Wt 208 lb 12.8 oz (94.711 kg)  BMI 26.81 kg/m2 The patient is alert and oriented x 3.  The mood and affect are normal.   Skin: warm and dry.  Color is normal.  HEENT:   His neck is supple. There are no carotid bruits. There is no JVD.  Lungs: His lungs are clear.   Heart: Regular rate. There are no murmurs    Abdomen: His abdomen is mildly obese. There is no hepatosplenomegaly.  Extremities:  His no clubbing cyanosis or edema.  Neuro:  Exam is nonfocal. His gait is normal.     ECG:  Assessment / Plan:

## 2011-11-15 NOTE — Assessment & Plan Note (Signed)
Jeffrey Stewart is doing very well. We'll continue with the same medications. His blood pressure is a little below but he denies any episodes of orthostasis. We will continue with his same medications. I'll see him again in 6 months.

## 2011-11-16 ENCOUNTER — Other Ambulatory Visit: Payer: Self-pay | Admitting: *Deleted

## 2011-11-16 ENCOUNTER — Other Ambulatory Visit (INDEPENDENT_AMBULATORY_CARE_PROVIDER_SITE_OTHER): Payer: Medicare Other

## 2011-11-16 DIAGNOSIS — I4510 Unspecified right bundle-branch block: Secondary | ICD-10-CM

## 2011-11-16 DIAGNOSIS — I2510 Atherosclerotic heart disease of native coronary artery without angina pectoris: Secondary | ICD-10-CM

## 2011-11-16 DIAGNOSIS — I509 Heart failure, unspecified: Secondary | ICD-10-CM

## 2011-11-16 LAB — HEPATIC FUNCTION PANEL
ALT: 15 U/L (ref 0–53)
AST: 20 U/L (ref 0–37)
Alkaline Phosphatase: 54 U/L (ref 39–117)
Bilirubin, Direct: 0.1 mg/dL (ref 0.0–0.3)
Total Bilirubin: 1.2 mg/dL (ref 0.3–1.2)
Total Protein: 6.3 g/dL (ref 6.0–8.3)

## 2011-11-16 LAB — BASIC METABOLIC PANEL
CO2: 27 mEq/L (ref 19–32)
Chloride: 103 mEq/L (ref 96–112)
Potassium: 4.9 mEq/L (ref 3.5–5.1)
Sodium: 137 mEq/L (ref 135–145)

## 2011-11-16 LAB — LIPID PANEL
Total CHOL/HDL Ratio: 3
Triglycerides: 93 mg/dL (ref 0.0–149.0)

## 2011-11-16 MED ORDER — NIACIN ER (ANTIHYPERLIPIDEMIC) 500 MG PO TBCR: 500.0000 mg | EXTENDED_RELEASE_TABLET | Freq: Every day | ORAL | Status: DC

## 2012-03-11 ENCOUNTER — Ambulatory Visit (INDEPENDENT_AMBULATORY_CARE_PROVIDER_SITE_OTHER): Payer: Medicare Other | Admitting: Cardiovascular Disease

## 2012-03-11 ENCOUNTER — Encounter: Payer: Self-pay | Admitting: Cardiovascular Disease

## 2012-03-11 VITALS — BP 112/69 | HR 73 | Ht 74.0 in | Wt 206.2 lb

## 2012-03-11 DIAGNOSIS — I2510 Atherosclerotic heart disease of native coronary artery without angina pectoris: Secondary | ICD-10-CM

## 2012-03-11 DIAGNOSIS — E785 Hyperlipidemia, unspecified: Secondary | ICD-10-CM

## 2012-03-11 LAB — LIPID PANEL
Cholesterol: 106 mg/dL (ref 0–200)
LDL Cholesterol: 45 mg/dL (ref 0–99)
VLDL: 25.6 mg/dL (ref 0.0–40.0)

## 2012-03-11 LAB — HEPATIC FUNCTION PANEL
Alkaline Phosphatase: 56 U/L (ref 39–117)
Bilirubin, Direct: 0.1 mg/dL (ref 0.0–0.3)
Total Bilirubin: 1 mg/dL (ref 0.3–1.2)
Total Protein: 6.7 g/dL (ref 6.0–8.3)

## 2012-03-11 LAB — BASIC METABOLIC PANEL
BUN: 21 mg/dL (ref 6–23)
Chloride: 102 mEq/L (ref 96–112)
Creatinine, Ser: 1.1 mg/dL (ref 0.4–1.5)
Glucose, Bld: 102 mg/dL — ABNORMAL HIGH (ref 70–99)
Potassium: 4.8 mEq/L (ref 3.5–5.1)

## 2012-03-11 NOTE — Patient Instructions (Addendum)
Your physician wants you to follow-up in: 6 months You will receive a reminder letter in the mail two months in advance. If you don't receive a letter, please call our office to schedule the follow-up appointment.  Your physician recommends that you return for a FASTING lipid profile: today and in 6 months  

## 2012-03-11 NOTE — Progress Notes (Signed)
Jeffrey Stewart Date of Birth  1934/10/01 [facility] [address]    [address] [place], [place]  [address] [phone]  Fax  [phone]  History of Present Illness:  77 yo gentleman with a hx of mild CHF.     He walks occasionally.  He has been exercising on a regular basis. He works out in his garden and walks on his treadmill in the evenings.   He's not had any significant episodes of chest pain or shortness breath.   Current Outpatient Prescriptions on File Prior to Visit  Medication Sig Dispense Refill  . acetaminophen (TYLENOL) 325 MG tablet Take 650 mg by mouth every 6 (six) hours as needed.        Marland Kitchen albuterol (PROVENTIL HFA;VENTOLIN HFA) 108 (90 BASE) MCG/ACT inhaler Inhale 1 puff into the lungs every 6 (six) hours as needed.      Marland Kitchen aspirin 325 MG tablet Take 325 mg by mouth daily.        Marland Kitchen atorvastatin (LIPITOR) 40 MG tablet Take 40 mg by mouth daily.      . carvedilol (COREG) 12.5 MG tablet Take 12.5 mg by mouth 2 (two) times daily with a meal.       . cetirizine (ZYRTEC) 10 MG tablet Take 10 mg by mouth as needed.        . fish oil-omega-3 fatty acids 1000 MG capsule Take 3 g by mouth 3 (three) times daily.        . fluticasone (VERAMYST) 27.5 MCG/SPRAY nasal spray Place 2 sprays into the nose daily.      Marland Kitchen GLUCOSAMINE PO Take 1 tablet by mouth 2 (two) times daily.       Marland Kitchen lisinopril (PRINIVIL,ZESTRIL) 5 MG tablet Take 5 mg by mouth daily.        . metroNIDAZOLE (METROCREAM) 0.75 % cream Apply topically 2 (two) times daily.      . minocycline (DYNACIN) 50 MG tablet Take 50 mg by mouth 2 (two) times daily.      . mometasone (ASMANEX) 220 MCG/INH inhaler Inhale 1 puff into the lungs daily.        Marland Kitchen Neomycin-Polymyxin-HC (CORTISPORIN OT) Place in ear(s) as needed.      . niacin (NIASPAN) 500 MG CR tablet Take 1 tablet (500 mg total) by mouth at bedtime.  30 tablet  3  . nitroGLYCERIN (NITROSTAT) 0.4 MG SL tablet Place 1 tablet (0.4 mg total) under the tongue every 5  (five) minutes as needed.  25 tablet  1  . DISCONTD: atorvastatin (LIPITOR) 40 MG tablet Take 1 tablet (40 mg total) by mouth daily.  30 tablet  11    Allergies  Allergen Reactions  . Niaspan (Niacin)     Intolerance      Past Medical History  Diagnosis Date  . Coronary artery disease   . CHF (congestive heart failure) 11/2009    EF45-50%  . Hyperlipidemia     Past Surgical History  Procedure Date  . Last echo 02/2009   . Last nuc 2009 12/16/2007    EF 33%. ABNORMAL. 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 Electrocardiogram 11/2009    RBBB  . Cardiac catheterization 11/2009    mild / mod cad,MODERATE TIGHT STENOSIS IN THE DISTAL LEFT CIRCUMFLEX ARTERY  . US echocardiography 02/23/2009    EF 45-50%  . US echocardiography 12/27/2007    EF 45-50%    History  Smoking status  .  Never Smoker   Smokeless tobacco  . Never Used    History  Alcohol Use No    No family history on file.  Reviw of Systems:  Reviewed in the HPI.  All other systems are negative.  Physical Exam: BP 112/69  Pulse 73  Ht 6\' 2"  (1.88 m)  Wt 206 lb 3.2 oz (93.532 kg)  BMI 26.47 kg/m2  SpO2 94% The patient is alert and oriented x 3.  The mood and affect are normal.   Skin: warm and dry.  Color is normal.    HEENT:   His neck is supple. There are no carotid bruits. There is no JVD.  Lungs: His lungs are clear.   Heart: Regular rate. There are no murmurs    Abdomen: His abdomen is mildly obese. There is no hepatosplenomegaly.  Extremities:  His no clubbing cyanosis or edema.  Neuro:  Exam is nonfocal. His gait is normal.     ECG:  Assessment / Plan:

## 2012-03-11 NOTE — Assessment & Plan Note (Signed)
Jeffrey Stewart's  last lipid profile revealed his HDL was slightly low. We have started him on Niaspan. We'll check fasting labs today. We'll anticipate checking fasting labs again in 6 months.

## 2012-03-11 NOTE — Assessment & Plan Note (Signed)
Jeffrey Stewart is doing very well. He's not having any episodes of chest pain or shortness of breath. We'll continue with the same medications. I'll see him again in 6 months for followup visit, fasting labs, and EKG.

## 2012-03-25 ENCOUNTER — Other Ambulatory Visit: Payer: Self-pay | Admitting: *Deleted

## 2012-03-25 MED ORDER — NITROGLYCERIN 0.4 MG SL SUBL: 0.4000 mg | SUBLINGUAL_TABLET | SUBLINGUAL | Status: DC | PRN

## 2012-03-25 NOTE — Telephone Encounter (Signed)
Fax Received. Refill Completed. Jeffrey Stewart (R.M.A)   

## 2012-08-22 ENCOUNTER — Encounter: Payer: Self-pay | Admitting: Cardiovascular Disease

## 2012-08-22 ENCOUNTER — Ambulatory Visit (INDEPENDENT_AMBULATORY_CARE_PROVIDER_SITE_OTHER): Payer: Medicare Other | Admitting: Cardiovascular Disease

## 2012-08-22 VITALS — BP 96/60 | HR 79 | Ht 74.0 in | Wt 205.0 lb

## 2012-08-22 DIAGNOSIS — E785 Hyperlipidemia, unspecified: Secondary | ICD-10-CM

## 2012-08-22 DIAGNOSIS — I2510 Atherosclerotic heart disease of native coronary artery without angina pectoris: Secondary | ICD-10-CM

## 2012-08-22 MED ORDER — NIACIN ER (ANTIHYPERLIPIDEMIC) 500 MG PO TBCR
500.0000 mg | EXTENDED_RELEASE_TABLET | Freq: Every day | ORAL | Status: DC
Start: 2012-08-22 — End: 2013-09-29

## 2012-08-22 MED ORDER — LISINOPRIL 5 MG PO TABS: 5.0000 mg | ORAL_TABLET | Freq: Every day | ORAL | Status: DC

## 2012-08-22 MED ORDER — CARVEDILOL 12.5 MG PO TABS: 12.5000 mg | ORAL_TABLET | Freq: Two times a day (BID) | ORAL | Status: DC

## 2012-08-22 MED ORDER — ATORVASTATIN CALCIUM 40 MG PO TABS: 40.0000 mg | ORAL_TABLET | Freq: Every day | ORAL | Status: DC

## 2012-08-22 NOTE — Assessment & Plan Note (Addendum)
Jeffrey Stewart is doing very well. He has not had any episodes of chest pain or shortness of breath. He remains quite active. He's looking for to getting back out and working in his garden.  We'll continue with the same medications. We will really his cardiac medications today. We'll have him return for fasting labs tomorrow. I'll see him in 6 months for office visit and fasting labs.  He has considerable discoloration on his legs but he doesn't have any significant edema. I don't think that these are due to stasis changes. I've asked him to see his medical doctor or his dermatologist.

## 2012-08-22 NOTE — Patient Instructions (Addendum)
Your physician recommends that you return for a FASTING lipid profile: tomorrow 7:30 am to 4 pm  Your physician wants you to follow-up in: 6 months You will receive a reminder letter in the mail two months in advance. If you don't receive a letter, please call our office to schedule the follow-up appointment.  Your physician recommends that you continue on your current medications as directed. Please refer to the Current Medication list given to you today.

## 2012-08-22 NOTE — Progress Notes (Signed)
Jeffrey Stewart Date of Birth  13-Jan-1935 [facility] [address]    [address] [place], [place]  [address] [phone]  Fax  [phone]  History of Present Illness:  78 yo gentleman with a hx of mild CHF.     He walks occasionally.  He has been exercising on a regular basis. He works out in his garden and walks on his treadmill in the evenings.   He's not had any significant episodes of chest pain or shortness breath.   Feb. 6, 2014  Lopez is doing well since I last saw him. He's not had any episodes of chest pain or shortness breath. He's exercising on a regular basis.  Current Outpatient Prescriptions on File Prior to Visit  Medication Sig Dispense Refill  . acetaminophen (TYLENOL) 325 MG tablet Take 650 mg by mouth every 6 (six) hours as needed.        Marland Kitchen albuterol (PROVENTIL HFA;VENTOLIN HFA) 108 (90 BASE) MCG/ACT inhaler Inhale 1 puff into the lungs every 6 (six) hours as needed.      Marland Kitchen aspirin 325 MG tablet Take 325 mg by mouth daily.        Marland Kitchen atorvastatin (LIPITOR) 40 MG tablet Take 40 mg by mouth daily.      . carvedilol (COREG) 12.5 MG tablet Take 12.5 mg by mouth 2 (two) times daily with a meal.       . cetirizine (ZYRTEC) 10 MG tablet Take 10 mg by mouth as needed.        . fish oil-omega-3 fatty acids 1000 MG capsule Take 3 g by mouth 3 (three) times daily.        . fluticasone (VERAMYST) 27.5 MCG/SPRAY nasal spray Place 2 sprays into the nose daily.      Marland Kitchen GLUCOSAMINE PO Take 1 tablet by mouth 2 (two) times daily.       Marland Kitchen lisinopril (PRINIVIL,ZESTRIL) 5 MG tablet Take 5 mg by mouth daily.        . metroNIDAZOLE (METROCREAM) 0.75 % cream Apply topically 2 (two) times daily.      . minocycline (DYNACIN) 50 MG tablet Take 50 mg by mouth 2 (two) times daily.      . mometasone (ASMANEX) 220 MCG/INH inhaler Inhale 1 puff into the lungs as needed.       Marland Kitchen Neomycin-Polymyxin-HC (CORTISPORIN OT) Place in ear(s) as needed.      . niacin (NIASPAN) 500 MG CR tablet Take 1  tablet (500 mg total) by mouth at bedtime.  30 tablet  3  . nitroGLYCERIN (NITROSTAT) 0.4 MG SL tablet Place 1 tablet (0.4 mg total) under the tongue every 5 (five) minutes as needed.  25 tablet  3    Allergies  Allergen Reactions  . Niaspan (Niacin)     Intolerance      Past Medical History  Diagnosis Date  . Coronary artery disease   . CHF (congestive heart failure) 11/2009    EF45-50%  . Hyperlipidemia     Past Surgical History  Procedure Date  . Last echo 02/2009   . Last nuc 2009 12/16/2007    EF 33%. ABNORMAL. 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 Electrocardiogram 11/2009    RBBB  . Cardiac catheterization 11/2009    mild / mod cad,MODERATE TIGHT STENOSIS IN THE DISTAL LEFT CIRCUMFLEX ARTERY  . US echocardiography 02/23/2009    EF 45-50%  . US echocardiography 12/27/2007    EF  45-50%    History  Smoking status  . Never Smoker   Smokeless tobacco  . Never Used    History  Alcohol Use No    No family history on file.  Reviw of Systems:  Reviewed in the HPI.  All other systems are negative.  Physical Exam: BP 96/60  Pulse 79  Ht 6\' 2"  (1.88 m)  Wt 205 lb (92.987 kg)  BMI 26.32 kg/m2 The patient is alert and oriented x 3.  The mood and affect are normal.   Skin: warm and dry.  Color is normal.    HEENT:   His neck is supple. There are no carotid bruits. There is no JVD.  Lungs: His lungs are clear.   Heart: Regular rate. There are no murmurs    Abdomen: His abdomen is mildly obese. There is no hepatosplenomegaly.  Extremities:  His no clubbing cyanosis or edema.  His lower legs are discolored.  Neuro:  Exam is nonfocal. His gait is normal.    ECG: 08/22/2012: Normal sinus rhythm at 79 beats a minute. He has occasional premature atrial contractions. His right bundle branch block. EKG is unchanged from previous tracings Assessment / Plan:

## 2012-08-23 ENCOUNTER — Other Ambulatory Visit (INDEPENDENT_AMBULATORY_CARE_PROVIDER_SITE_OTHER): Payer: Medicare Other

## 2012-08-23 DIAGNOSIS — E785 Hyperlipidemia, unspecified: Secondary | ICD-10-CM

## 2012-08-23 DIAGNOSIS — I2510 Atherosclerotic heart disease of native coronary artery without angina pectoris: Secondary | ICD-10-CM

## 2012-08-23 LAB — BASIC METABOLIC PANEL
Calcium: 9 mg/dL (ref 8.4–10.5)
Creatinine, Ser: 1.1 mg/dL (ref 0.4–1.5)
Sodium: 135 mEq/L (ref 135–145)

## 2012-08-23 LAB — LIPID PANEL
Cholesterol: 91 mg/dL (ref 0–200)
HDL: 28.3 mg/dL — ABNORMAL LOW (ref >39.00)
Triglycerides: 74 mg/dL (ref 0.0–149.0)

## 2012-08-23 LAB — HEPATIC FUNCTION PANEL
ALT: 14 U/L (ref 0–53)
AST: 17 U/L (ref 0–37)
Albumin: 3.9 g/dL (ref 3.5–5.2)
Alkaline Phosphatase: 57 U/L (ref 39–117)
Total Protein: 6.3 g/dL (ref 6.0–8.3)

## 2012-08-26 ENCOUNTER — Telehealth: Payer: Self-pay | Admitting: Cardiovascular Disease

## 2012-08-26 NOTE — Telephone Encounter (Signed)
PT AWARE OF LAB RESULTS./CY 

## 2012-08-26 NOTE — Telephone Encounter (Signed)
Pt rtn call to pat re results

## 2013-03-10 ENCOUNTER — Other Ambulatory Visit: Payer: Medicare Other

## 2013-03-10 ENCOUNTER — Ambulatory Visit (INDEPENDENT_AMBULATORY_CARE_PROVIDER_SITE_OTHER): Payer: Medicare Other | Admitting: Cardiovascular Disease

## 2013-03-10 ENCOUNTER — Encounter: Payer: Self-pay | Admitting: Cardiovascular Disease

## 2013-03-10 VITALS — BP 108/68 | HR 68 | Ht 74.0 in | Wt 203.8 lb

## 2013-03-10 DIAGNOSIS — I509 Heart failure, unspecified: Secondary | ICD-10-CM

## 2013-03-10 DIAGNOSIS — I5022 Chronic systolic (congestive) heart failure: Secondary | ICD-10-CM

## 2013-03-10 DIAGNOSIS — E785 Hyperlipidemia, unspecified: Secondary | ICD-10-CM

## 2013-03-10 DIAGNOSIS — I2510 Atherosclerotic heart disease of native coronary artery without angina pectoris: Secondary | ICD-10-CM

## 2013-03-10 LAB — BASIC METABOLIC PANEL
Calcium: 9.3 mg/dL (ref 8.4–10.5)
Creatinine, Ser: 1 mg/dL (ref 0.4–1.5)
GFR: 76.34 mL/min (ref >60.00)
Sodium: 131 mEq/L — ABNORMAL LOW (ref 135–145)

## 2013-03-10 LAB — LIPID PANEL
Cholesterol: 94 mg/dL (ref 0–200)
Total CHOL/HDL Ratio: 3
Triglycerides: 103 mg/dL (ref 0.0–149.0)

## 2013-03-10 LAB — HEPATIC FUNCTION PANEL
ALT: 15 U/L (ref 0–53)
AST: 18 U/L (ref 0–37)
Albumin: 4 g/dL (ref 3.5–5.2)
Alkaline Phosphatase: 51 U/L (ref 39–117)
Total Protein: 6.5 g/dL (ref 6.0–8.3)

## 2013-03-10 MED ORDER — ATORVASTATIN CALCIUM 40 MG PO TABS: 40.0000 mg | ORAL_TABLET | Freq: Every day | ORAL | Status: DC

## 2013-03-10 NOTE — Assessment & Plan Note (Signed)
Jeffrey Stewart is doing OK.  No symptoms of CHF.  Will repeat his echo shortly before his next visit.

## 2013-03-10 NOTE — Progress Notes (Signed)
Jeffrey Stewart Date of Birth  Mar 03, 1935 [facility] [address]    [address] [place], [place]  [address] [phone]  Fax  [phone]  Problem list: 1. Congestive heart failure-chronic systolic congestive heart failure 2. RBBB 3. Mild coronary artery disease-tight stenosis in the distal left circumflex artery 4. Hyperlipidemia  History of Present Illness:  78 yo gentleman with a hx of mild CHF.     He walks occasionally.  He has been exercising on a regular basis. He works out in his garden and walks on his treadmill in the evenings.   He's not had any significant episodes of chest pain or shortness breath.   Feb. 6, 2014  Jeffrey Stewart is doing well since I last saw him. He's not had any episodes of chest pain or shortness breath. He's exercising on a regular basis.  March 10, 2013:  Jeffrey Stewart is doing well. He's not had any episodes of chest pain or shortness breath.  Playing golf, fishing, gardening, boating this past summer.   Tries to avoid salt.  No dypsnea.    Current Outpatient Prescriptions on File Prior to Visit  Medication Sig Dispense Refill  . acetaminophen (TYLENOL) 325 MG tablet Take 650 mg by mouth every 6 (six) hours as needed.        Marland Kitchen albuterol (PROVENTIL HFA;VENTOLIN HFA) 108 (90 BASE) MCG/ACT inhaler Inhale 1 puff into the lungs every 6 (six) hours as needed.      Marland Kitchen aspirin 325 MG tablet Take 325 mg by mouth daily.        Marland Kitchen atorvastatin (LIPITOR) 40 MG tablet Take 1 tablet (40 mg total) by mouth daily.  90 tablet  1  . carvedilol (COREG) 12.5 MG tablet Take 1 tablet (12.5 mg total) by mouth 2 (two) times daily with a meal.  180 tablet  3  . cetirizine (ZYRTEC) 10 MG tablet Take 10 mg by mouth as needed.        . fish oil-omega-3 fatty acids 1000 MG capsule Take 3 g by mouth 3 (three) times daily.        . fluticasone (VERAMYST) 27.5 MCG/SPRAY nasal spray Place 2 sprays into the nose daily.      Marland Kitchen GLUCOSAMINE PO Take 1 tablet by mouth 2 (two) times daily.        Marland Kitchen lisinopril (PRINIVIL,ZESTRIL) 5 MG tablet Take 1 tablet (5 mg total) by mouth daily.  90 tablet  3  . metroNIDAZOLE (METROCREAM) 0.75 % cream Apply topically 2 (two) times daily.      . minocycline (DYNACIN) 50 MG tablet Take 50 mg by mouth 2 (two) times daily.      . mometasone (ASMANEX) 220 MCG/INH inhaler Inhale 1 puff into the lungs as needed.       Marland Kitchen Neomycin-Polymyxin-HC (CORTISPORIN OT) Place in ear(s) as needed.      . niacin (NIASPAN) 500 MG CR tablet Take 1 tablet (500 mg total) by mouth at bedtime.  90 tablet  3  . nitroGLYCERIN (NITROSTAT) 0.4 MG SL tablet Place 1 tablet (0.4 mg total) under the tongue every 5 (five) minutes as needed.  25 tablet  3   No current facility-administered medications on file prior to visit.    Allergies  Allergen Reactions  . Niaspan [Niacin]     Intolerance  Flush feeling    Past Medical History  Diagnosis Date  . Coronary artery disease   . CHF (congestive heart failure) 11/2009    EF45-50%  .  Hyperlipidemia     Past Surgical History  Procedure Laterality Date  . Last echo 02/2009    . Last nuc 2009  12/16/2007    EF 33%. ABNORMAL. 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 Electrocardiogram  11/2009    RBBB  . Cardiac catheterization  11/2009    mild / mod cad,MODERATE TIGHT STENOSIS IN THE DISTAL LEFT CIRCUMFLEX ARTERY  . US echocardiography  02/23/2009    EF 45-50%  . US echocardiography  12/27/2007    EF 45-50%    History  Smoking status  . Never Smoker   Smokeless tobacco  . Never Used    History  Alcohol Use No    No family history on file.  Reviw of Systems:  Reviewed in the HPI.  All other systems are negative.  Physical Exam: BP 108/68  Pulse 68  Ht 6\' 2"  (1.88 m)  Wt 203 lb 12.8 oz (92.443 kg)  BMI 26.16 kg/m2 The patient is alert and oriented x 3.  The mood and affect are normal.   Skin: warm and dry.  Color is normal.    HEENT:   His neck is supple. There are no  carotid bruits. There is no JVD.  Lungs: His lungs are clear.   Heart: Regular rate. There are no murmurs    Abdomen: His abdomen is mildly obese. There is no hepatosplenomegaly.  Extremities:  His no clubbing cyanosis or edema.  His lower legs are discolored.  Neuro:  Exam is nonfocal. His gait is normal.    ECG: 08/22/2012: Normal sinus rhythm at 79 beats a minute. He has occasional premature atrial contractions. His right bundle branch block. EKG is unchanged from previous tracings Assessment / Plan:

## 2013-03-10 NOTE — Patient Instructions (Addendum)
Your physician recommends that you return for lab work in: TODAY AND IN 6 MONTHS   Your physician has requested that you have an echocardiogram IN 6 MONTHS WITH OV TO FOLLOW. Echocardiography is a painless test that uses sound waves to create images of your heart. It provides your doctor with information about the size and shape of your heart and how well your heart's chambers and valves are working. This procedure takes approximately one hour. There are no restrictions for this procedure.   Your physician wants you to follow-up in: 6 MONTHS WITH ECHO 1 WEEK PRIOR You will receive a reminder letter in the mail two months in advance. If you don't receive a letter, please call our office to schedule the follow-up appointment.

## 2013-03-10 NOTE — Assessment & Plan Note (Signed)
He has mild CAD.  No current symptoms.  Continue current meds.  Check lipids today.

## 2013-03-11 ENCOUNTER — Telehealth: Payer: Self-pay | Admitting: *Deleted

## 2013-03-11 NOTE — Progress Notes (Signed)
Quick Note:  Notified patient's wife of lab results as "stable", per Dr. Elease Hashimoto. Patient requests results be mailed to his home address, as they do not have a computer and can not utilize [employer]. This has been completed. ______

## 2013-03-11 NOTE — Telephone Encounter (Signed)
Notified patient's wife of lab results as "stable", per Dr. Elease Hashimoto. Patient requests results be mailed to his home address, as they do not have a computer and can not utilize [employer].

## 2013-03-11 NOTE — Telephone Encounter (Signed)
Message copied by Prescilla Sours on Tue Mar 11, 2013  2:57 PM ------      Message from: Vesta Mixer      Created: Tue Mar 11, 2013  2:43 PM       Labs are stable. ------

## 2013-07-30 ENCOUNTER — Other Ambulatory Visit: Payer: Self-pay

## 2013-07-30 DIAGNOSIS — I2510 Atherosclerotic heart disease of native coronary artery without angina pectoris: Secondary | ICD-10-CM

## 2013-07-30 DIAGNOSIS — E785 Hyperlipidemia, unspecified: Secondary | ICD-10-CM

## 2013-07-30 DIAGNOSIS — I509 Heart failure, unspecified: Secondary | ICD-10-CM

## 2013-07-30 MED ORDER — ATORVASTATIN CALCIUM 40 MG PO TABS: 40.0000 mg | ORAL_TABLET | Freq: Every day | ORAL | Status: DC

## 2013-07-30 MED ORDER — CARVEDILOL 12.5 MG PO TABS: 12.5000 mg | ORAL_TABLET | Freq: Two times a day (BID) | ORAL | Status: DC

## 2013-07-30 MED ORDER — LISINOPRIL 5 MG PO TABS: 5.0000 mg | ORAL_TABLET | Freq: Every day | ORAL | Status: DC

## 2013-08-26 ENCOUNTER — Other Ambulatory Visit: Payer: Self-pay | Admitting: Family Medicine

## 2013-08-26 DIAGNOSIS — R1032 Left lower quadrant pain: Secondary | ICD-10-CM

## 2013-08-28 ENCOUNTER — Other Ambulatory Visit: Payer: Self-pay | Admitting: Family Medicine

## 2013-08-28 ENCOUNTER — Ambulatory Visit
Admission: RE | Admit: 2013-08-28 | Discharge: 2013-08-28 | Disposition: A | Discharge status: Home / Self Care | Payer: Medicare HMO | Source: Ambulatory Visit | Attending: Family Medicine | Admitting: Family Medicine

## 2013-08-28 DIAGNOSIS — R1032 Left lower quadrant pain: Secondary | ICD-10-CM

## 2013-08-28 MED ORDER — IOHEXOL 300 MG/ML  SOLN
125.0000 mL | Freq: Once | INTRAMUSCULAR | Status: AC | PRN
  Administered 2013-08-28: 125 mL via INTRAVENOUS

## 2013-09-03 ENCOUNTER — Encounter (HOSPITAL_BASED_OUTPATIENT_CLINIC_OR_DEPARTMENT_OTHER): Payer: Self-pay | Admitting: Emergency Medicine

## 2013-09-03 ENCOUNTER — Emergency Department (HOSPITAL_BASED_OUTPATIENT_CLINIC_OR_DEPARTMENT_OTHER)
Admission: EM | Admit: 2013-09-03 | Discharge: 2013-09-03 | Disposition: A | Discharge status: Home / Self Care | Payer: Medicare HMO | Attending: Emergency Medicine | Admitting: Emergency Medicine

## 2013-09-03 ENCOUNTER — Emergency Department (HOSPITAL_BASED_OUTPATIENT_CLINIC_OR_DEPARTMENT_OTHER): Payer: Medicare HMO

## 2013-09-03 DIAGNOSIS — R1032 Left lower quadrant pain: Secondary | ICD-10-CM | POA: Insufficient documentation

## 2013-09-03 DIAGNOSIS — Z79899 Other long term (current) drug therapy: Secondary | ICD-10-CM | POA: Insufficient documentation

## 2013-09-03 DIAGNOSIS — R109 Unspecified abdominal pain: Secondary | ICD-10-CM

## 2013-09-03 DIAGNOSIS — E785 Hyperlipidemia, unspecified: Secondary | ICD-10-CM | POA: Insufficient documentation

## 2013-09-03 DIAGNOSIS — R110 Nausea: Secondary | ICD-10-CM | POA: Insufficient documentation

## 2013-09-03 DIAGNOSIS — Z7982 Long term (current) use of aspirin: Secondary | ICD-10-CM | POA: Insufficient documentation

## 2013-09-03 DIAGNOSIS — I2510 Atherosclerotic heart disease of native coronary artery without angina pectoris: Secondary | ICD-10-CM | POA: Insufficient documentation

## 2013-09-03 DIAGNOSIS — K4090 Unilateral inguinal hernia, without obstruction or gangrene, not specified as recurrent: Secondary | ICD-10-CM | POA: Insufficient documentation

## 2013-09-03 DIAGNOSIS — Z9889 Other specified postprocedural states: Secondary | ICD-10-CM | POA: Insufficient documentation

## 2013-09-03 DIAGNOSIS — Z792 Long term (current) use of antibiotics: Secondary | ICD-10-CM | POA: Insufficient documentation

## 2013-09-03 DIAGNOSIS — IMO0002 Reserved for concepts with insufficient information to code with codable children: Secondary | ICD-10-CM | POA: Insufficient documentation

## 2013-09-03 DIAGNOSIS — I509 Heart failure, unspecified: Secondary | ICD-10-CM | POA: Insufficient documentation

## 2013-09-03 LAB — URINALYSIS, ROUTINE W REFLEX MICROSCOPIC
Bilirubin Urine: NEGATIVE
Glucose, UA: NEGATIVE mg/dL
HGB URINE DIPSTICK: NEGATIVE
Ketones, ur: NEGATIVE mg/dL
Leukocytes, UA: NEGATIVE
NITRITE: NEGATIVE
Protein, ur: NEGATIVE mg/dL
Specific Gravity, Urine: 1.01 (ref 1.005–1.030)
UROBILINOGEN UA: 1 mg/dL (ref 0.0–1.0)
pH: 7 (ref 5.0–8.0)

## 2013-09-03 LAB — CBC WITH DIFFERENTIAL/PLATELET
Basophils Absolute: 0 10*3/uL (ref 0.0–0.1)
Basophils Relative: 1 % (ref 0–1)
Eosinophils Absolute: 0.5 10*3/uL (ref 0.0–0.7)
Eosinophils Relative: 9 % — ABNORMAL HIGH (ref 0–5)
HCT: 43.2 % (ref 39.0–52.0)
HEMOGLOBIN: 14.9 g/dL (ref 13.0–17.0)
LYMPHS ABS: 1.8 10*3/uL (ref 0.7–4.0)
LYMPHS PCT: 30 % (ref 12–46)
MCH: 33.2 pg (ref 26.0–34.0)
MCHC: 34.5 g/dL (ref 30.0–36.0)
MCV: 96.2 fL (ref 78.0–100.0)
MONOS PCT: 13 % — AB (ref 3–12)
Monocytes Absolute: 0.8 10*3/uL (ref 0.1–1.0)
NEUTROS PCT: 49 % (ref 43–77)
Neutro Abs: 2.9 10*3/uL (ref 1.7–7.7)
PLATELETS: 130 10*3/uL — AB (ref 150–400)
RBC: 4.49 MIL/uL (ref 4.22–5.81)
RDW: 12 % (ref 11.5–15.5)
WBC: 6 10*3/uL (ref 4.0–10.5)

## 2013-09-03 LAB — COMPREHENSIVE METABOLIC PANEL
ALK PHOS: 64 U/L (ref 39–117)
ALT: 12 U/L (ref 0–53)
AST: 17 U/L (ref 0–37)
Albumin: 4.1 g/dL (ref 3.5–5.2)
BUN: 18 mg/dL (ref 6–23)
CO2: 27 mEq/L (ref 19–32)
Calcium: 9.8 mg/dL (ref 8.4–10.5)
Chloride: 101 mEq/L (ref 96–112)
Creatinine, Ser: 0.9 mg/dL (ref 0.50–1.35)
GFR, EST NON AFRICAN AMERICAN: 81 mL/min — AB (ref >90)
GLUCOSE: 105 mg/dL — AB (ref 70–99)
POTASSIUM: 4.5 meq/L (ref 3.7–5.3)
SODIUM: 138 meq/L (ref 137–147)
Total Bilirubin: 0.7 mg/dL (ref 0.3–1.2)
Total Protein: 6.6 g/dL (ref 6.0–8.3)

## 2013-09-03 LAB — CG4 I-STAT (LACTIC ACID): LACTIC ACID, VENOUS: 0.55 mmol/L (ref 0.5–2.2)

## 2013-09-03 MED ORDER — MORPHINE SULFATE 4 MG/ML IJ SOLN
4.0000 mg | Freq: Once | INTRAMUSCULAR | Status: AC
  Administered 2013-09-03: 4 mg via INTRAVENOUS
  Filled 2013-09-03: qty 1

## 2013-09-03 MED ORDER — ONDANSETRON HCL 4 MG/2ML IJ SOLN
4.0000 mg | Freq: Once | INTRAMUSCULAR | Status: AC
  Administered 2013-09-03: 4 mg via INTRAVENOUS
  Filled 2013-09-03: qty 2

## 2013-09-03 NOTE — ED Provider Notes (Signed)
CSN: [id number]     Arrival date & time 09/03/13  1239 History   First MD Initiated Contact with Patient 09/03/13 1503     Chief Complaint  Patient presents with  . Abdominal Pain     (Consider location/radiation/quality/duration/timing/severity/associated sxs/prior Treatment) HPI  This is a 79 yo with history of coronary artery disease, congestive heart failure, and hyperlipidemia who presents with left lower quadrant pain. Patient reports 2-3 week history of sharp left lower quadrant pain. He was seen by his primary care physician and treated for presumed diverticulitis.  He subsequently had a CT scan when he continued to have symptoms. CT scan has been reviewed by me and shows small bilateral inguinal hernias and no evidence of diverticulitis. Patient states that he's been set up for GI referral next week. Patient reports in the last 24 hours symptoms have worsened. He currently rates his pain at 4/10. At times it is 10 out of 10. Nothing makes it better or worse. He has had nausea without vomiting. Last normal bowel movement was at 3 AM this morning. He denies any fevers.  Past Medical History  Diagnosis Date  . Coronary artery disease   . CHF (congestive heart failure) 11/2009    EF45-50%  . Hyperlipidemia    Past Surgical History  Procedure Laterality Date  . Last echo 02/2009    . Last nuc 2009  12/16/2007    EF 33%. ABNORMAL. 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 Electrocardiogram  11/2009    RBBB  . Cardiac catheterization  11/2009    mild / mod cad,MODERATE TIGHT STENOSIS IN THE DISTAL LEFT CIRCUMFLEX ARTERY  . US echocardiography  02/23/2009    EF 45-50%  . US echocardiography  12/27/2007    EF 45-50%   History reviewed. No pertinent family history. History  Substance Use Topics  . Smoking status: Never Smoker   . Smokeless tobacco: Never Used  . Alcohol Use: No    Review of Systems  Constitutional: Negative.  Negative for  fever.  Respiratory: Negative.  Negative for chest tightness and shortness of breath.   Cardiovascular: Negative.  Negative for chest pain.  Gastrointestinal: Positive for nausea and abdominal pain. Negative for vomiting, diarrhea, constipation and blood in stool.  Genitourinary: Negative.  Negative for dysuria.  Musculoskeletal: Negative for back pain.  Skin: Negative for rash.  Neurological: Negative for headaches.  All other systems reviewed and are negative.      Allergies  Niaspan  Home Medications   Current Outpatient Rx  Name  Route  Sig  Dispense  Refill  . acetaminophen (TYLENOL) 325 MG tablet   Oral   Take 650 mg by mouth every 6 (six) hours as needed.           Marland Kitchen albuterol (PROVENTIL HFA;VENTOLIN HFA) 108 (90 BASE) MCG/ACT inhaler   Inhalation   Inhale 1 puff into the lungs every 6 (six) hours as needed.         Marland Kitchen aspirin 325 MG tablet   Oral   Take 325 mg by mouth daily.           Marland Kitchen atorvastatin (LIPITOR) 40 MG tablet   Oral   Take 1 tablet (40 mg total) by mouth daily.   90 tablet   3   . carvedilol (COREG) 12.5 MG tablet   Oral   Take 1 tablet (12.5 mg total) by mouth 2 (two) times daily with a meal.  180 tablet   0     .Marland KitchenPatient needs to contact office to schedule  App ...   . cetirizine (ZYRTEC) 10 MG tablet   Oral   Take 10 mg by mouth as needed.           . fish oil-omega-3 fatty acids 1000 MG capsule   Oral   Take 3 g by mouth 3 (three) times daily.           . fluticasone (VERAMYST) 27.5 MCG/SPRAY nasal spray   Nasal   Place 2 sprays into the nose daily.         Marland Kitchen GLUCOSAMINE PO   Oral   Take 1 tablet by mouth 2 (two) times daily.          Marland Kitchen lisinopril (PRINIVIL,ZESTRIL) 5 MG tablet   Oral   Take 1 tablet (5 mg total) by mouth daily.   90 tablet   0     .Marland KitchenPatient needs to contact office to schedule  App ...   . metroNIDAZOLE (METROCREAM) 0.75 % cream   Topical   Apply topically 2 (two) times daily.          . minocycline (DYNACIN) 50 MG tablet   Oral   Take 50 mg by mouth 2 (two) times daily.         . mometasone (ASMANEX) 220 MCG/INH inhaler   Inhalation   Inhale 1 puff into the lungs as needed.          Marland Kitchen Neomycin-Polymyxin-HC (CORTISPORIN OT)   Otic   Place in ear(s) as needed.         Marland Kitchen EXPIRED: niacin (NIASPAN) 500 MG CR tablet   Oral   Take 1 tablet (500 mg total) by mouth at bedtime.   90 tablet   3   . nitroGLYCERIN (NITROSTAT) 0.4 MG SL tablet   Sublingual   Place 1 tablet (0.4 mg total) under the tongue every 5 (five) minutes as needed.   25 tablet   3    BP 109/56  Pulse 63  Temp(Src) 98.2 F (36.8 C) (Oral)  Resp 16  Ht 6' (1.829 m)  Wt 206 lb (93.441 kg)  BMI 27.93 kg/m2  SpO2 98% Physical Exam  Nursing note and vitals reviewed. Constitutional: He is oriented to person, place, and time. No distress.  Elderly  HENT:  Head: Normocephalic and atraumatic.  Mouth/Throat: Oropharynx is clear and moist.  Neck: Neck supple.  Cardiovascular: Normal rate, regular rhythm and normal heart sounds.   No murmur heard. Pulmonary/Chest: Effort normal and breath sounds normal. No respiratory distress. He has no wheezes.  Abdominal: Soft. Bowel sounds are normal. He exhibits no distension. There is no tenderness. There is no rebound and no guarding.  Genitourinary:  Normal scrotum, uncircumcised, mild erythema noted to the scrotum that is nonblanching, no crepitus noted, no mass or hernia noted  Musculoskeletal: He exhibits no edema.  Lymphadenopathy:    He has no cervical adenopathy.  Neurological: He is alert and oriented to person, place, and time.  Skin: Skin is warm and dry.  Psychiatric: He has a normal mood and affect.    ED Course  Procedures (including critical care time) Labs Review Labs Reviewed  CBC WITH DIFFERENTIAL - Abnormal; Notable for the following:    Platelets 130 (*)    Monocytes Relative 13 (*)    Eosinophils Relative 9 (*)    All  other components within normal limits  COMPREHENSIVE METABOLIC PANEL -  Abnormal; Notable for the following:    Glucose, Bld 105 (*)    GFR calc non Af Amer 81 (*)    All other components within normal limits  URINALYSIS, ROUTINE W REFLEX MICROSCOPIC  CG4 I-STAT (LACTIC ACID)   Imaging Review Dg Abd 1 View  09/03/2013   CLINICAL DATA:  Abdominal pain.  EXAM: ABDOMEN - 1 VIEW  COMPARISON:  Abdominal CT 08/28/2013  FINDINGS: Nonobstructive bowel gas pattern. Stool volume within normal limits. No abnormal intra-abdominal mass effect. Parallel calcifications in the left upper quadrant related to splenic artery atherosclerosis. Sclerotic focus in the central right ilium, likely bone island.  IMPRESSION: Nonobstructive bowel gas pattern.   Electronically Signed   By: Jorje Guild M.D.   On: 09/03/2013 16:17    EKG Interpretation   None       MDM   Final diagnoses:  Abdominal pain    Patient presents with persistent abdominal pain. Had a CT scan on February 10 which I have reviewed. Shows small bilateral inguinal hernias containing fat. Patient's exam is benign.  There is no evidence of hernia or mass on my exam. Lab work and KUB is reassuring. Discussed findings with the patient and his wife. He is currently pain-free.  Given his reassuring exam, do not feel that he needs repeat CT scan at this time. He will followup with Dr. Watt Climes.  After history, exam, and medical workup I feel the patient has been appropriately medically screened and is safe for discharge home. Pertinent diagnoses were discussed with the patient. Patient was given return precautions.     Merryl Hacker, MD 09/03/13 [id number]

## 2013-09-03 NOTE — ED Notes (Signed)
Pt c/o left lower abd pain x 3 weeks , seen by PMD , CT scan done DX hernia and tx for UTI Appt with gastro next thurs

## 2013-09-03 NOTE — Discharge Instructions (Signed)
Abdominal Pain, Adult Many things can cause abdominal pain. Usually, abdominal pain is not caused by a disease and will improve without treatment. It can often be observed and treated at home. Your health care provider will do a physical exam and possibly order blood tests and X-rays to help determine the seriousness of your pain. However, in many cases, more time must pass before a clear cause of the pain can be found. Before that point, your health care provider may not know if you need more testing or further treatment.  Your workup here was negative. I have reviewed her CT scan. You need to followup with GI as soon as possible. You should return if he has any new or worsening symptoms. HOME CARE INSTRUCTIONS  Monitor your abdominal pain for any changes. The following actions may help to alleviate any discomfort you are experiencing:  Only take over-the-counter or prescription medicines as directed by your health care provider.  Do not take laxatives unless directed to do so by your health care provider.  Try a clear liquid diet (broth, tea, or water) as directed by your health care provider. Slowly move to a bland diet as tolerated. SEEK MEDICAL CARE IF:  You have unexplained abdominal pain.  You have abdominal pain associated with nausea or diarrhea.  You have pain when you urinate or have a bowel movement.  You experience abdominal pain that wakes you in the night.  You have abdominal pain that is worsened or improved by eating food.  You have abdominal pain that is worsened with eating fatty foods. SEEK IMMEDIATE MEDICAL CARE IF:   Your pain does not go away within 2 hours.  You have a fever.  You keep throwing up (vomiting).  Your pain is felt only in portions of the abdomen, such as the right side or the left lower portion of the abdomen.  You pass bloody or black tarry stools. MAKE SURE YOU:  Understand these instructions.   Will watch your condition.   Will get  help right away if you are not doing well or get worse.  Document Released: 04/12/2005 Document Revised: 04/23/2013 Document Reviewed: 03/12/2013 [facility] Patient Information 2014 [place].

## 2013-09-09 ENCOUNTER — Ambulatory Visit (INDEPENDENT_AMBULATORY_CARE_PROVIDER_SITE_OTHER): Payer: Medicare HMO | Admitting: General Surgery

## 2013-09-09 ENCOUNTER — Ambulatory Visit (INDEPENDENT_AMBULATORY_CARE_PROVIDER_SITE_OTHER): Payer: Medicare HMO | Admitting: Cardiovascular Disease

## 2013-09-09 ENCOUNTER — Encounter: Payer: Self-pay | Admitting: Cardiovascular Disease

## 2013-09-09 VITALS — BP 128/71 | HR 69 | Ht 72.0 in | Wt 200.0 lb

## 2013-09-09 DIAGNOSIS — I509 Heart failure, unspecified: Secondary | ICD-10-CM

## 2013-09-09 DIAGNOSIS — E785 Hyperlipidemia, unspecified: Secondary | ICD-10-CM

## 2013-09-09 DIAGNOSIS — I2510 Atherosclerotic heart disease of native coronary artery without angina pectoris: Secondary | ICD-10-CM

## 2013-09-09 NOTE — Assessment & Plan Note (Signed)
He only has mild coronary artery irregularities by heart cath many years ago. He's not had any angina.   He has been having lots of abdominal pain and recently was on a 2 abdominal hernias. He will be seeing the general surgeons today.   He is at low risk for his upcoming hernia surgery if he needs to go to surgery.

## 2013-09-09 NOTE — Assessment & Plan Note (Signed)
Has a history of chronic systolic congestive heart therapy at his ejection fraction was 35-40% by cardiac catheterization in 2009.  He's not had an echocardiogram in recent years. We'll repeat his echocardiogram to assess his left ventricular function. We'll continue with his  carvedilol and lisinopril.

## 2013-09-09 NOTE — Patient Instructions (Signed)

## 2013-09-09 NOTE — Assessment & Plan Note (Signed)
His last cholesterol levels are from August. His numbers are always quite low. We'll check his lipids next year.

## 2013-09-09 NOTE — Progress Notes (Signed)
Jeffrey Stewart Date of Birth  1935/06/23 [facility] [address]    [place], [place]  [address] [phone]  Fax  [phone]  Problem list: 1. Congestive heart failure-chronic systolic congestive heart failure 2. RBBB 3. Mild coronary artery disease-tight stenosis in the distal left circumflex artery 4. Hyperlipidemia  History of Present Illness:  79 yo gentleman with a hx of mild CHF.     He walks occasionally.  He has been exercising on a regular basis. He works out in his garden and walks on his treadmill in the evenings.   He's not had any significant episodes of chest pain or shortness breath.   Feb. 6, 2014  Jeffrey Stewart is doing well since I last saw him. He's not had any episodes of chest pain or shortness breath. He's exercising on a regular basis.  March 10, 2013:  Jeffrey Stewart is doing well. He's not had any episodes of chest pain or shortness breath.  Playing golf, fishing, gardening, boating this past summer.   Tries to avoid salt.  No dypsnea.    Feb. 24, 2015:  Pt is doing OK.  No CP.  He has some abdominal issues - has 2 hernias.  Will be seen the surgeons today.  Current Outpatient Prescriptions on File Prior to Visit  Medication Sig Dispense Refill  . acetaminophen (TYLENOL) 325 MG tablet Take 650 mg by mouth every 6 (six) hours as needed.        Marland Kitchen albuterol (PROVENTIL HFA;VENTOLIN HFA) 108 (90 BASE) MCG/ACT inhaler Inhale 1 puff into the lungs every 6 (six) hours as needed.      Marland Kitchen aspirin 325 MG tablet Take 325 mg by mouth daily.        Marland Kitchen atorvastatin (LIPITOR) 40 MG tablet Take 1 tablet (40 mg total) by mouth daily.  90 tablet  3  . carvedilol (COREG) 12.5 MG tablet Take 1 tablet (12.5 mg total) by mouth 2 (two) times daily with a meal.  180 tablet  0  . cetirizine (ZYRTEC) 10 MG tablet Take 10 mg by mouth as needed.        . fish oil-omega-3 fatty acids 1000 MG capsule Take 3 g by mouth 3 (three) times daily.        . fluticasone (VERAMYST) 27.5  MCG/SPRAY nasal spray Place 2 sprays into the nose daily.      Marland Kitchen GLUCOSAMINE PO Take 1 tablet by mouth 2 (two) times daily.       Marland Kitchen lisinopril (PRINIVIL,ZESTRIL) 5 MG tablet Take 1 tablet (5 mg total) by mouth daily.  90 tablet  0  . metroNIDAZOLE (METROCREAM) 0.75 % cream Apply topically 2 (two) times daily.      . minocycline (DYNACIN) 50 MG tablet Take 50 mg by mouth 2 (two) times daily.      . mometasone (ASMANEX) 220 MCG/INH inhaler Inhale 1 puff into the lungs as needed.       Marland Kitchen Neomycin-Polymyxin-HC (CORTISPORIN OT) Place in ear(s) as needed.      . nitroGLYCERIN (NITROSTAT) 0.4 MG SL tablet Place 1 tablet (0.4 mg total) under the tongue every 5 (five) minutes as needed.  25 tablet  3  . niacin (NIASPAN) 500 MG CR tablet Take 1 tablet (500 mg total) by mouth at bedtime.  90 tablet  3   No current facility-administered medications on file prior to visit.    Allergies  Allergen Reactions  . Niaspan [Niacin]     Intolerance  Flush  feeling    Past Medical History  Diagnosis Date  . Coronary artery disease   . CHF (congestive heart failure) 11/2009    EF45-50%  . Hyperlipidemia     Past Surgical History  Procedure Laterality Date  . Last echo 02/2009    . Last nuc 2009  12/16/2007    EF 33%. ABNORMAL. 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 Electrocardiogram  11/2009    RBBB  . Cardiac catheterization  11/2009    mild / mod cad,MODERATE TIGHT STENOSIS IN THE DISTAL LEFT CIRCUMFLEX ARTERY  . US echocardiography  02/23/2009    EF 45-50%  . US echocardiography  12/27/2007    EF 45-50%    History  Smoking status  . Never Smoker   Smokeless tobacco  . Never Used    History  Alcohol Use No    No family history on file.  Reviw of Systems:  Reviewed in the HPI.  All other systems are negative.  Physical Exam: BP 128/71  Pulse 69  Ht 6' (1.829 m)  Wt 200 lb (90.719 kg)  BMI 27.12 kg/m2 The patient is alert and oriented x 3.   The mood and affect are normal.   Skin: warm and dry.  Color is normal.    HEENT:   His neck is supple. There are no carotid bruits. There is no JVD.  Lungs: His lungs are clear.   Heart: Regular rate. There are no murmurs    Abdomen: His abdomen is mildly obese. There is no hepatosplenomegaly.  Extremities:  His no clubbing cyanosis or edema.  His lower legs are discolored.  Neuro:  Exam is nonfocal. His gait is normal.    ECG: Feb. 24, 2015:  NSR at 71.  LAD, RBBB,  Assessment / Plan:

## 2013-09-18 ENCOUNTER — Encounter: Payer: Self-pay | Admitting: Cardiovascular Disease

## 2013-09-18 ENCOUNTER — Ambulatory Visit (HOSPITAL_COMMUNITY): Payer: Medicare HMO | Attending: Cardiovascular Disease | Admitting: Radiology

## 2013-09-18 DIAGNOSIS — I4510 Unspecified right bundle-branch block: Secondary | ICD-10-CM

## 2013-09-18 DIAGNOSIS — I452 Bifascicular block: Secondary | ICD-10-CM | POA: Insufficient documentation

## 2013-09-18 DIAGNOSIS — I2510 Atherosclerotic heart disease of native coronary artery without angina pectoris: Secondary | ICD-10-CM

## 2013-09-18 DIAGNOSIS — I509 Heart failure, unspecified: Secondary | ICD-10-CM | POA: Insufficient documentation

## 2013-09-18 NOTE — Progress Notes (Signed)
Echocardiogram performed.  

## 2013-09-19 ENCOUNTER — Encounter (INDEPENDENT_AMBULATORY_CARE_PROVIDER_SITE_OTHER): Payer: Self-pay | Admitting: General Surgery

## 2013-09-19 ENCOUNTER — Encounter: Payer: Self-pay | Admitting: *Deleted

## 2013-09-19 ENCOUNTER — Ambulatory Visit (INDEPENDENT_AMBULATORY_CARE_PROVIDER_SITE_OTHER): Payer: Medicare HMO | Admitting: General Surgery

## 2013-09-19 ENCOUNTER — Encounter (INDEPENDENT_AMBULATORY_CARE_PROVIDER_SITE_OTHER): Payer: Self-pay

## 2013-09-19 VITALS — BP 130/72 | HR 78 | Temp 99.0°F | Resp 12 | Ht 72.0 in | Wt 202.4 lb

## 2013-09-19 DIAGNOSIS — K4020 Bilateral inguinal hernia, without obstruction or gangrene, not specified as recurrent: Secondary | ICD-10-CM | POA: Insufficient documentation

## 2013-09-19 NOTE — Progress Notes (Signed)
Patient ID: Jeffrey Stewart, male   DOB: [birth year], 79 y.o.   MRN: [id number]  No chief complaint on file.   HPI Jeffrey Stewart is a 79 y.o. male.  He is referred by Dr. Lizbeth Bark for evaluation of bilateral inguinal hernias and abdominal pain. Dr. Tollie Pizza is his PCP. Dr. Acie Fredrickson is his cardiologist.   The patient is here with his wife. I performed a colon resection on her for villous adenoma many years ago. The patient states that he still works at the [employer] and walks a lot, but no strenuous work.   He gives a three-week history of left groin pain. He points to the left inguinal area extending toward the scrotum and pubic tubercle. This hurts him mostly when he walks. He had one episode of severe pain and nausea and went to the emergency room. Evaluation was negative except for a CT scan showed  bilateral inguinal hernias, small, containing fat only. The pain is intermittent and usually related to walking. He saw Dr. Watt Climes who noted that he had a normal colonoscopy 4 1/2  years ago. He thought that the hernias were the cause of his pain.  He also recently saw Dr. Acie Fredrickson  for his annual checkup and was told everything seemed to be going well. An echocardiogram was recently done. He is in no distress today.  Comorbidities include coronary artery disease, status post myocardial infarction. No angina. A cardiac catheter but apparently no stents or surgery. Hyperlipidemia. Hypertension. Asthma. Open appendectomy age 65. He is on antibiotics chronically for rosacea he says he has a leaking aortic valve. HPI  Past Medical History  Diagnosis Date  . Coronary artery disease   . CHF (congestive heart failure) 11/2009    EF45-50%  . Hyperlipidemia   . Allergy   . Asthma   . Cataract     Past Surgical History  Procedure Laterality Date  . Last echo 02/2009    . Last nuc 2009  12/16/2007    EF 33%. ABNORMAL. 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  Electrocardiogram  11/2009    RBBB  . Cardiac catheterization  11/2009    mild / mod cad,MODERATE TIGHT STENOSIS IN THE DISTAL LEFT CIRCUMFLEX ARTERY  . US echocardiography  02/23/2009    EF 45-50%  . US echocardiography  12/27/2007    EF 45-50%  . Eye surgery      History reviewed. No pertinent family history.  Social History History  Substance Use Topics  . Smoking status: Never Smoker   . Smokeless tobacco: Never Used  . Alcohol Use: No    Allergies  Allergen Reactions  . Niaspan [Niacin]     Intolerance  Flush feeling    Current Outpatient Prescriptions  Medication Sig Dispense Refill  . acetaminophen (TYLENOL) 325 MG tablet Take 650 mg by mouth every 6 (six) hours as needed.        Marland Kitchen albuterol (PROVENTIL HFA;VENTOLIN HFA) 108 (90 BASE) MCG/ACT inhaler Inhale 1 puff into the lungs every 6 (six) hours as needed.      Marland Kitchen aspirin 325 MG tablet Take 325 mg by mouth daily.        Marland Kitchen atorvastatin (LIPITOR) 40 MG tablet Take 1 tablet (40 mg total) by mouth daily.  90 tablet  3  . carvedilol (COREG) 12.5 MG tablet Take 1 tablet (12.5 mg total) by mouth 2 (two) times daily with a meal.  180 tablet  0  . cetirizine (ZYRTEC) 10 MG tablet Take 10 mg by mouth as needed.        . fish oil-omega-3 fatty acids 1000 MG capsule Take 3 g by mouth 3 (three) times daily.        . fluticasone (VERAMYST) 27.5 MCG/SPRAY nasal spray Place 2 sprays into the nose daily.      Marland Kitchen GLUCOSAMINE PO Take 1 tablet by mouth 2 (two) times daily.       Marland Kitchen lisinopril (PRINIVIL,ZESTRIL) 5 MG tablet Take 1 tablet (5 mg total) by mouth daily.  90 tablet  0  . metroNIDAZOLE (METROCREAM) 0.75 % cream Apply topically 2 (two) times daily.      . minocycline (DYNACIN) 50 MG tablet Take 50 mg by mouth 2 (two) times daily.      . mometasone (ASMANEX) 220 MCG/INH inhaler Inhale 1 puff into the lungs as needed.       Marland Kitchen Neomycin-Polymyxin-HC (CORTISPORIN OT) Place in ear(s) as needed.      . nitroGLYCERIN (NITROSTAT) 0.4 MG SL  tablet Place 1 tablet (0.4 mg total) under the tongue every 5 (five) minutes as needed.  25 tablet  3  . niacin (NIASPAN) 500 MG CR tablet Take 1 tablet (500 mg total) by mouth at bedtime.  90 tablet  3   No current facility-administered medications for this visit.    Review of Systems Review of Systems  Constitutional: Negative for fever, chills and unexpected weight change.  HENT: Positive for sinus pressure. Negative for congestion, hearing loss, sore throat, trouble swallowing and voice change.   Eyes: Negative for visual disturbance.  Respiratory: Positive for wheezing. Negative for cough.   Cardiovascular: Negative for chest pain, palpitations and leg swelling.  Gastrointestinal: Positive for abdominal pain. Negative for nausea, vomiting, diarrhea, constipation, blood in stool, abdominal distention, anal bleeding and rectal pain.  Genitourinary: Negative for hematuria and difficulty urinating.  Musculoskeletal: Negative for arthralgias.  Skin: Positive for rash. Negative for wound.  Neurological: Negative for seizures, syncope, weakness and headaches.  Hematological: Negative for adenopathy. Does not bruise/bleed easily.  Psychiatric/Behavioral: Negative for confusion.    Blood pressure 130/72, pulse 78, temperature 99 F (37.2 C), temperature source Oral, resp. rate 12, height 6' (1.829 m), weight 202 lb 6.4 oz (91.808 kg).  Physical Exam Physical Exam  Constitutional: He is oriented to person, place, and time. He appears well-developed and well-nourished. No distress.  HENT:  Head: Normocephalic.  Nose: Nose normal.  Mouth/Throat: No oropharyngeal exudate.  Eyes: Conjunctivae and EOM are normal. Pupils are equal, round, and reactive to light. Right eye exhibits no discharge. Left eye exhibits no discharge. No scleral icterus.  Neck: Normal range of motion. Neck supple. No JVD present. No tracheal deviation present. No thyromegaly present.  Cardiovascular: Normal rate,  regular rhythm, normal heart sounds and intact distal pulses.   No murmur heard. Pulmonary/Chest: Effort normal and breath sounds normal. No stridor. No respiratory distress. He has no wheezes. He has no rales. He exhibits no tenderness.  Abdominal: Soft. Bowel sounds are normal. He exhibits no distension and no mass. There is no tenderness. There is no rebound and no guarding.  Small right lower quadrant scar. Remote appendectomy. No hernia there  Genitourinary:  Medium-sized left inguinal hernia, very small right inguinal hernia. Both testes descended. Normal size and contour. A scrotal mass.  Musculoskeletal: Normal range of motion. He exhibits no edema and no tenderness.  Lymphadenopathy:    He has no cervical adenopathy.  Neurological: He is alert and oriented to person, place, and time. He has normal reflexes. Coordination normal.  Skin: Skin is warm and dry. No rash noted. He is not diaphoretic. No erythema. No pallor.  Psychiatric: He has a normal mood and affect. His behavior is normal. Judgment and thought content normal.    Data Reviewed  Dr. Perley Jain office notes. Emergency department records. CT scan.  Assessment    Symptomatic left inguinal hernia. Medium-sized.I agree that this is almost certainly the cause of his pain.  Small right inguinal hernia, asymptomatic  History of an appendectomy as a teenager  Asthma  Coronary artery disease without angina  History of myocardial infarction  Hypertension  Hyperlipidemia  Rosacea     Plan    We spent a long time talking about the anatomy of inguinal hernias, the natural history of inguinal hernias, and the different techniques for repair and the pros and cons of each.  He clearly wants to go ahead and have this done. He also wants to have the right hernia repair at the same time.  He'll be scheduled for laparoscopic repair of bilateral inguinal hernias with mesh, possible open.  I discussed the indications,  details, techniques, and numerous risk of the surgery with him and his wife. He is aware of the risk of bleeding, infection, conversion to open surgery, recurrence of the hernia, nerve damage with chronic pain or numbness, injury to the testicle or bladder or intestine, cardiac pulmonary and thromboembolic problems. He understands all these issues all of his questions were answered.. He agrees with this plan.  We'll asked Dr. Acie Fredrickson for cardiac risk assessment and clearance  preop.        Nichola Warren M 09/19/2013, 2:46 PM

## 2013-09-19 NOTE — Patient Instructions (Signed)
You have a medium-sized left inguinal hernia and a smaller right inguinal hernia. I agree that the left groin pain is almost certainly due to the hernia.  We talked about the anatomy of the hernias, the natural history of hernias, and the different surgical techniques.  We have decided to proceed with scheduling for a laparoscopic repair of bilateral inguinal hernias with mesh, possible open.  We will ask Dr. Jomarie Longs to approve of the surgery and general anesthesia from a cardiac standpoint preop.    Hernia Repair with Laparoscope A hernia occurs when an internal organ pushes out through a weak spot in the belly (abdominal) wall muscles. Hernias most commonly occur in the groin and around the navel. Hernias can also occur through a cut by the surgeon (incision) after an abdominal operation. A hernia may be caused by:  Lifting heavy objects.  Prolonged coughing.  Straining to move your bowels. Hernias can often be pushed back into place (reduced). Most hernias tend to get worse over time. Problems occur when abdominal contents get stuck in the opening and the blood supply is blocked or impaired (incarcerated hernia). Because of these risks, you require surgery to repair the hernia. Your hernia will be repaired using a laparoscope. Laparoscopic surgery is a type of minimally invasive surgery. It does not involve making a typical surgical cut (incision) in the skin. A laparoscope is a telescope-like rod and lens system. It is usually connected to a video camera and a light source so your caregiver can clearly see the operative area. The instruments are inserted through  to  inch (5 mm or 10 mm) openings in the skin at specific locations. A working and viewing space is created by blowing a small amount of carbon dioxide gas into the abdominal cavity. The abdomen is essentially blown up like a balloon (insufflated). This elevates the abdominal wall above the internal organs like a dome. The carbon  dioxide gas is common to the human body and can be absorbed by tissue and removed by the respiratory system. Once the repair is completed, the small incisions will be closed with either stitches (sutures) or staples (just like a paper stapler only this staple holds the skin together). LET YOUR CAREGIVERS KNOW ABOUT:  Allergies.  Medications taken including herbs, eye drops, over the counter medications, and creams.  Use of steroids (by mouth or creams).  Previous problems with anesthetics or Novocaine.  Possibility of pregnancy, if this applies.  History of blood clots (thrombophlebitis).  History of bleeding or blood problems.  Previous surgery.  Other health problems. BEFORE THE PROCEDURE  Laparoscopy can be done either in a hospital or out-patient clinic. You may be given a mild sedative to help you relax before the procedure. Once in the operating room, you will be given a general anesthesia to make you sleep (unless you and your caregiver choose a different anesthetic).  AFTER THE PROCEDURE  After the procedure you will be watched in a recovery area. Depending on what type of hernia was repaired, you might be admitted to the hospital or you might go home the same day. With this procedure you may have less pain and scarring. This usually results in a quicker recovery and less risk of infection. HOME CARE INSTRUCTIONS   Bed rest is not required. You may continue your normal activities but avoid heavy lifting (more than 10 pounds) or straining.  Cough gently. If you are a smoker it is best to stop, as even the best hernia  repair can break down with the continual strain of coughing.  Avoid driving until given the OK by your surgeon.  There are no dietary restrictions unless given otherwise.  TAKE ALL MEDICATIONS AS DIRECTED.  Only take over-the-counter or prescription medicines for pain, discomfort, or fever as directed by your caregiver. SEEK MEDICAL CARE IF:   There is  increasing abdominal pain or pain in your incisions.  There is more bleeding from incisions, other than minimal spotting.  You feel light headed or faint.  You develop an unexplained fever, chills, and/or an oral temperature above 102 F (38.9 C).  You have redness, swelling, or increasing pain in the wound.  Pus coming from wound.  A foul smell coming from the wound or dressings. SEEK IMMEDIATE MEDICAL CARE IF:   You develop a rash.  You have difficulty breathing.  You have any allergic problems. MAKE SURE YOU:   Understand these instructions.  Will watch your condition.  Will get help right away if you are not doing well or get worse. Document Released: 07/03/2005 Document Revised: 09/25/2011 Document Reviewed: 06/02/2009 [facility] Patient Information 2014 [place], [place]. Inguinal Hernia, Adult Muscles help keep everything in the body in its proper place. But if a weak spot in the muscles develops, something can poke through. That is called a hernia. When this happens in the lower part of the belly (abdomen), it is called an inguinal hernia. (It takes its name from a part of the body in this region called the inguinal canal.) A weak spot in the wall of muscles lets some fat or part of the small intestine bulge through. An inguinal hernia can develop at any age. Men get them more often than women. CAUSES  In adults, an inguinal hernia develops over time.  It can be triggered by:  Suddenly straining the muscles of the lower abdomen.  Lifting heavy objects.  Straining to have a bowel movement. Difficult bowel movements (constipation) can lead to this.  Constant coughing. This may be caused by smoking or lung disease.  Being overweight.  Being pregnant.  Working at a job that requires long periods of standing or heavy lifting.  Having had an inguinal hernia before. One type can be an emergency situation. It is called a strangulated inguinal hernia. It develops if  part of the small intestine slips through the weak spot and cannot get back into the abdomen. The blood supply can be cut off. If that happens, part of the intestine may die. This situation requires emergency surgery. SYMPTOMS  Often, a small inguinal hernia has no symptoms. It is found when a healthcare provider does a physical exam. Larger hernias usually have symptoms.   In adults, symptoms may include:  A lump in the groin. This is easier to see when the person is standing. It might disappear when lying down.  In men, a lump in the scrotum.  Pain or burning in the groin. This occurs especially when lifting, straining or coughing.  A dull ache or feeling of pressure in the groin.  Signs of a strangulated hernia can include:  A bulge in the groin that becomes very painful and tender to the touch.  A bulge that turns red or purple.  Fever, nausea and vomiting.  Inability to have a bowel movement or to pass gas. DIAGNOSIS  To decide if you have an inguinal hernia, a healthcare provider will probably do a physical examination.  This will include asking questions about any symptoms you have noticed.  The healthcare provider might feel the groin area and ask you to cough. If an inguinal hernia is felt, the healthcare provider may try to slide it back into the abdomen.  Usually no other tests are needed. TREATMENT  Treatments can vary. The size of the hernia makes a difference. Options include:  Watchful waiting. This is often suggested if the hernia is small and you have had no symptoms.  No medical procedure will be done unless symptoms develop.  You will need to watch closely for symptoms. If any occur, contact your healthcare provider right away.  Surgery. This is used if the hernia is larger or you have symptoms.  Open surgery. This is usually an outpatient procedure (you will not stay overnight in a hospital). An cut (incision) is made through the skin in the groin. The  hernia is put back inside the abdomen. The weak area in the muscles is then repaired by herniorrhaphy or hernioplasty. Herniorrhaphy: in this type of surgery, the weak muscles are sewn back together. Hernioplasty: a patch or mesh is used to close the weak area in the abdominal wall.  Laparoscopy. In this procedure, a surgeon makes small incisions. A thin tube with a tiny video camera (called a laparoscope) is put into the abdomen. The surgeon repairs the hernia with mesh by looking with the video camera and using two long instruments. HOME CARE INSTRUCTIONS   After surgery to repair an inguinal hernia:  You will need to take pain medicine prescribed by your healthcare provider. Follow all directions carefully.  You will need to take care of the wound from the incision.  Your activity will be restricted for awhile. This will probably include no heavy lifting for several weeks. You also should not do anything too active for a few weeks. When you can return to work will depend on the type of job that you have.  During "watchful waiting" periods, you should:  Maintain a healthy weight.  Eat a diet high in fiber (fruits, vegetables and whole grains).  Drink plenty of fluids to avoid constipation. This means drinking enough water and other liquids to keep your urine clear or pale yellow.  Do not lift heavy objects.  Do not stand for long periods of time.  Quit smoking. This should keep you from developing a frequent cough. SEEK MEDICAL CARE IF:   A bulge develops in your groin area.  You feel pain, a burning sensation or pressure in the groin. This might be worse if you are lifting or straining.  You develop a fever of more than 100.5 F (38.1 C). SEEK IMMEDIATE MEDICAL CARE IF:   Pain in the groin increases suddenly.  A bulge in the groin gets bigger suddenly and does not go down.  For men, there is sudden pain in the scrotum. Or, the size of the scrotum increases.  A bulge in  the groin area becomes red or purple and is painful to touch.  You have nausea or vomiting that does not go away.  You feel your heart beating much faster than normal.  You cannot have a bowel movement or pass gas.  You develop a fever of more than 102.0 F (38.9 C). Document Released: 11/19/2008 Document Revised: 09/25/2011 Document Reviewed: 11/19/2008 [facility] Patient Information 2014 [place], [place].

## 2013-09-22 ENCOUNTER — Encounter (INDEPENDENT_AMBULATORY_CARE_PROVIDER_SITE_OTHER): Payer: Self-pay

## 2013-09-23 ENCOUNTER — Encounter (HOSPITAL_COMMUNITY): Payer: Self-pay | Admitting: Pharmacy Technician

## 2013-09-23 NOTE — Patient Instructions (Addendum)
STOP ASPIRIN AND HERBALS ( LIKE FISH OIL )  5 DAYS BEFORE SURGERY.  YOUR SURGERY IS SCHEDULED AT [facility]  ON:  Monday  3/16  REPORT TO  SHORT STAY CENTER AT:  10:00 AM      PHONE # FOR SHORT STAY IS [phone]  DO NOT EAT OR DRINK ANYTHING AFTER MIDNIGHT THE NIGHT BEFORE YOUR SURGERY.  YOU MAY BRUSH YOUR TEETH, RINSE OUT YOUR MOUTH--BUT NO WATER, NO FOOD, NO CHEWING GUM, NO MINTS, NO CANDIES, NO CHEWING TOBACCO.  PLEASE TAKE THE FOLLOWING MEDICATIONS THE AM OF YOUR SURGERY WITH A FEW SIPS OF WATER:  CARVEDILOL.   USE YOUR ALBUTEROL INHALER AND BRING IT TO THE HOSPITAL.   DO NOT BRING VALUABLES, MONEY, CREDIT CARDS.  DO NOT WEAR JEWELRY, MAKE-UP, NAIL POLISH AND NO METAL PINS OR CLIPS IN YOUR HAIR. CONTACT LENS, DENTURES / PARTIALS, GLASSES SHOULD NOT BE WORN TO SURGERY AND IN MOST CASES-HEARING AIDS WILL NEED TO BE REMOVED.  BRING YOUR GLASSES CASE, ANY EQUIPMENT NEEDED FOR YOUR CONTACT LENS. FOR PATIENTS ADMITTED TO THE HOSPITAL--CHECK OUT TIME THE DAY OF DISCHARGE IS 11:00 AM.  ALL INPATIENT ROOMS ARE PRIVATE - WITH BATHROOM, TELEPHONE, TELEVISION AND WIFI INTERNET.  IF YOU ARE BEING DISCHARGED THE SAME DAY OF YOUR SURGERY--YOU CAN NOT DRIVE YOURSELF HOME--AND SHOULD NOT GO HOME ALONE BY TAXI OR BUS.  NO DRIVING OR OPERATING MACHINERY, OR MAKING LEGAL DECISIONS FOR 24 HOURS FOLLOWING ANESTHESIA / PAIN MEDICATIONS.  PLEASE MAKE ARRANGEMENTS FOR SOMEONE TO BE WITH YOU AT HOME THE FIRST 24 HOURS AFTER SURGERY. RESPONSIBLE DRIVER'S NAME / PHONE                                                  PT'S WIFE Jeffrey Stewart  [phone]  FAILURE TO FOLLOW THESE INSTRUCTIONS MAY RESULT IN THE CANCELLATION OF YOUR SURGERY. PLEASE BE AWARE THAT YOU MAY NEED ADDITIONAL BLOOD DRAWN DAY OF YOUR SURGERY  PATIENT SIGNATURE_________________________________

## 2013-09-24 ENCOUNTER — Encounter (HOSPITAL_COMMUNITY)
Admission: RE | Admit: 2013-09-24 | Discharge: 2013-09-24 | Disposition: A | Discharge status: Home / Self Care | Payer: Medicare HMO | Source: Ambulatory Visit | Attending: General Surgery | Admitting: General Surgery

## 2013-09-24 ENCOUNTER — Encounter (HOSPITAL_COMMUNITY): Payer: Self-pay

## 2013-09-24 ENCOUNTER — Other Ambulatory Visit: Payer: Self-pay | Admitting: Cardiovascular Disease

## 2013-09-24 ENCOUNTER — Ambulatory Visit (HOSPITAL_COMMUNITY)
Admission: RE | Admit: 2013-09-24 | Discharge: 2013-09-24 | Disposition: A | Discharge status: Home / Self Care | Payer: Medicare HMO | Source: Ambulatory Visit | Attending: General Surgery | Admitting: General Surgery

## 2013-09-24 DIAGNOSIS — K469 Unspecified abdominal hernia without obstruction or gangrene: Secondary | ICD-10-CM | POA: Insufficient documentation

## 2013-09-24 DIAGNOSIS — Z01818 Encounter for other preprocedural examination: Secondary | ICD-10-CM | POA: Insufficient documentation

## 2013-09-24 DIAGNOSIS — Z01812 Encounter for preprocedural laboratory examination: Secondary | ICD-10-CM | POA: Insufficient documentation

## 2013-09-24 DIAGNOSIS — J984 Other disorders of lung: Secondary | ICD-10-CM | POA: Insufficient documentation

## 2013-09-24 HISTORY — DX: Acute myocardial infarction, unspecified: I21.9

## 2013-09-24 HISTORY — DX: Unspecified osteoarthritis, unspecified site: M19.90

## 2013-09-24 HISTORY — DX: Shortness of breath: R06.02

## 2013-09-24 HISTORY — DX: Pneumonia, unspecified organism: J18.9

## 2013-09-24 HISTORY — DX: Rosacea, unspecified: L71.9

## 2013-09-24 LAB — CBC WITH DIFFERENTIAL/PLATELET
Basophils Absolute: 0 10*3/uL (ref 0.0–0.1)
Basophils Relative: 0 % (ref 0–1)
EOS PCT: 10 % — AB (ref 0–5)
Eosinophils Absolute: 0.7 10*3/uL (ref 0.0–0.7)
HCT: 45.1 % (ref 39.0–52.0)
HEMOGLOBIN: 15.6 g/dL (ref 13.0–17.0)
Lymphocytes Relative: 32 % (ref 12–46)
Lymphs Abs: 2.1 10*3/uL (ref 0.7–4.0)
MCH: 32.7 pg (ref 26.0–34.0)
MCHC: 34.6 g/dL (ref 30.0–36.0)
MCV: 94.5 fL (ref 78.0–100.0)
MONO ABS: 0.6 10*3/uL (ref 0.1–1.0)
MONOS PCT: 9 % (ref 3–12)
NEUTROS PCT: 50 % (ref 43–77)
Neutro Abs: 3.4 10*3/uL (ref 1.7–7.7)
Platelets: 123 10*3/uL — ABNORMAL LOW (ref 150–400)
RBC: 4.77 MIL/uL (ref 4.22–5.81)
RDW: 12.4 % (ref 11.5–15.5)
WBC: 6.8 10*3/uL (ref 4.0–10.5)

## 2013-09-24 LAB — URINALYSIS, ROUTINE W REFLEX MICROSCOPIC
Bilirubin Urine: NEGATIVE
GLUCOSE, UA: NEGATIVE mg/dL
HGB URINE DIPSTICK: NEGATIVE
Ketones, ur: NEGATIVE mg/dL
Leukocytes, UA: NEGATIVE
Nitrite: NEGATIVE
Protein, ur: NEGATIVE mg/dL
Specific Gravity, Urine: 1.016 (ref 1.005–1.030)
Urobilinogen, UA: 1 mg/dL (ref 0.0–1.0)
pH: 5.5 (ref 5.0–8.0)

## 2013-09-24 LAB — BASIC METABOLIC PANEL
BUN: 17 mg/dL (ref 6–23)
CO2: 27 mEq/L (ref 19–32)
CREATININE: 0.95 mg/dL (ref 0.50–1.35)
Calcium: 9.6 mg/dL (ref 8.4–10.5)
Chloride: 100 mEq/L (ref 96–112)
GFR, EST NON AFRICAN AMERICAN: 79 mL/min — AB (ref >90)
Glucose, Bld: 91 mg/dL (ref 70–99)
Potassium: 4.6 mEq/L (ref 3.7–5.3)
Sodium: 137 mEq/L (ref 137–147)

## 2013-09-24 MED ORDER — CHLORHEXIDINE GLUCONATE 4 % EX LIQD: 1.0000 "application " | Freq: Once | CUTANEOUS | Status: DC

## 2013-09-24 NOTE — Pre-Procedure Instructions (Signed)
EKG REPORT IN [facility] WITH CARDIOLOGY OFFICE NOTE DR. NAHSER 09/09/13.  ECHO REPORT IN [facility] 09/18/13. CXR WAS DONE TODAY PREOP AT [facility].

## 2013-09-26 NOTE — H&P (Signed)
Jeffrey Stewart    MRN:  [id number]   Description: 79 year old male  Provider: [name], MD  Department: [facility]         Diagnoses      Bilateral inguinal hernia    -  Primary      550.92             Current Vitals - Last Recorded      BP Pulse Temp(Src) Resp Ht Wt      130/72 78 99 F (37.2 C) (Oral) 12 6' (1.829 m) 202 lb 6.4 oz (91.808 kg)       BMI 27.44 kg/m2                      History and Physical     [name], MD      Status: Signed            Patient ID: Jeffrey Stewart, male   DOB: [birth year], 79 y.o.   MRN: [id number]             HPI Jeffrey Stewart is a 79 y.o. male.  He is referred by Dr. Lizbeth Bark for evaluation of bilateral inguinal hernias and abdominal pain. Dr. Tollie Pizza is his PCP. Dr. Acie Fredrickson is his cardiologist.    The patient is here with his wife. I performed a colon resection on her for villous adenoma many years ago. The patient states that he still works at the [employer] and walks a lot, but no strenuous work.   He gives a three-week history of left groin pain. He points to the left inguinal area extending toward the scrotum and pubic tubercle. This hurts him mostly when he walks. He had one episode of severe pain and nausea and went to the emergency room. Evaluation was negative except for a CT scan showed  bilateral inguinal hernias, small, containing fat only. The pain is intermittent and usually related to walking. He saw Dr. Watt Climes who noted that he had a normal colonoscopy 4 1/2  years ago. He thought that the hernias were the cause of his pain.  He also recently saw Dr. Acie Fredrickson  for his annual checkup and was told everything seemed to be going well. An echocardiogram was recently done. He is in no distress today.   Comorbidities include coronary artery disease, status post myocardial infarction. No angina. A cardiac catheter but apparently no stents or surgery. Hyperlipidemia. Hypertension. Asthma. Open  appendectomy age 38. He is on antibiotics chronically for rosacea he says he has a leaking aortic valve.        Past Medical History   Diagnosis  Date   .  Coronary artery disease     .  CHF (congestive heart failure)  11/2009       EF45-50%   .  Hyperlipidemia     .  Allergy     .  Asthma     .  Cataract           Past Surgical History   Procedure  Laterality  Date   .  Last echo 02/2009       .  Last nuc 2009    12/16/2007       EF 33%. ABNORMAL. HE HAS APICAL DEFECT CONSISTENT WITH A SCAR.LV FUNCTION MODERATELY  DEPRESSED   .  Electrocardiogram    11/2009       RBBB   .  Cardiac catheterization    11/2009       mild / mod cad,MODERATE TIGHT STENOSIS IN THE DISTAL LEFT CIRCUMFLEX ARTERY   .  US echocardiography    02/23/2009       EF 45-50%   .  US echocardiography    12/27/2007       EF 45-50%   .  Eye surgery            History reviewed. No pertinent family history.   Social History History   Substance Use Topics   .  Smoking status:  Never Smoker    .  Smokeless tobacco:  Never Used   .  Alcohol Use:  No         Allergies   Allergen  Reactions   .  Niaspan [Niacin]         Intolerance  Flush feeling         Current Outpatient Prescriptions   Medication  Sig  Dispense  Refill   .  acetaminophen (TYLENOL) 325 MG tablet  Take 650 mg by mouth every 6 (six) hours as needed.           Marland Kitchen  albuterol (PROVENTIL HFA;VENTOLIN HFA) 108 (90 BASE) MCG/ACT inhaler  Inhale 1 puff into the lungs every 6 (six) hours as needed.         Marland Kitchen  aspirin 325 MG tablet  Take 325 mg by mouth daily.           Marland Kitchen  atorvastatin (LIPITOR) 40 MG tablet  Take 1 tablet (40 mg total) by mouth daily.   90 tablet   3   .  carvedilol (COREG) 12.5 MG tablet  Take 1 tablet (12.5 mg total) by mouth 2 (two) times daily with a meal.   180 tablet   0   .  cetirizine (ZYRTEC) 10 MG tablet  Take 10 mg by mouth as needed.           .  fish oil-omega-3 fatty acids 1000 MG capsule  Take 3 g by mouth 3  (three) times daily.          .  fluticasone (VERAMYST) 27.5 MCG/SPRAY nasal spray  Place 2 sprays into the nose daily.         Marland Kitchen  GLUCOSAMINE PO  Take 1 tablet by mouth 2 (two) times daily.          Marland Kitchen  lisinopril (PRINIVIL,ZESTRIL) 5 MG tablet  Take 1 tablet (5 mg total) by mouth daily.   90 tablet   0   .  metroNIDAZOLE (METROCREAM) 0.75 % cream  Apply topically 2 (two) times daily.         .  minocycline (DYNACIN) 50 MG tablet  Take 50 mg by mouth 2 (two) times daily.         .  mometasone (ASMANEX) 220 MCG/INH inhaler  Inhale 1 puff into the lungs as needed.          Marland Kitchen  Neomycin-Polymyxin-HC (CORTISPORIN OT)  Place in ear(s) as needed.         .  nitroGLYCERIN (NITROSTAT) 0.4 MG SL tablet  Place 1 tablet (0.4 mg total) under the tongue every 5 (five) minutes as needed.   25 tablet   3   .  niacin (NIASPAN) 500 MG CR tablet  Take 1 tablet (500 mg total) by mouth at bedtime.   90 tablet   3  Review of Systems   Constitutional: Negative for fever, chills and unexpected weight change.  HENT: Positive for sinus pressure. Negative for congestion, hearing loss, sore throat, trouble swallowing and voice change.   Eyes: Negative for visual disturbance.  Respiratory: Positive for wheezing. Negative for cough.   Cardiovascular: Negative for chest pain, palpitations and leg swelling.  Gastrointestinal: Positive for abdominal pain. Negative for nausea, vomiting, diarrhea, constipation, blood in stool, abdominal distention, anal bleeding and rectal pain.  Genitourinary: Negative for hematuria and difficulty urinating.  Musculoskeletal: Negative for arthralgias.  Skin: Positive for rash. Negative for wound.  Neurological: Negative for seizures, syncope, weakness and headaches.  Hematological: Negative for adenopathy. Does not bruise/bleed easily.  Psychiatric/Behavioral: Negative for confusion.      Blood pressure 130/72, pulse 78, temperature 99 F (37.2 C), temperature source Oral,  resp. rate 12, height 6' (1.829 m), weight 202 lb 6.4 oz (91.808 kg).   Physical Exam   Constitutional: He is oriented to person, place, and time. He appears well-developed and well-nourished. No distress.  HENT:   Head: Normocephalic.   Nose: Nose normal.   Mouth/Throat: No oropharyngeal exudate.  Eyes: Conjunctivae and EOM are normal. Pupils are equal, round, and reactive to light. Right eye exhibits no discharge. Left eye exhibits no discharge. No scleral icterus.  Neck: Normal range of motion. Neck supple. No JVD present. No tracheal deviation present. No thyromegaly present.  Cardiovascular: Normal rate, regular rhythm, normal heart sounds and intact distal pulses.    No murmur heard. Pulmonary/Chest: Effort normal and breath sounds normal. No stridor. No respiratory distress. He has no wheezes. He has no rales. He exhibits no tenderness.  Abdominal: Soft. Bowel sounds are normal. He exhibits no distension and no mass. There is no tenderness. There is no rebound and no guarding.  Small right lower quadrant scar. Remote appendectomy. No hernia there  Genitourinary:  Medium-sized left inguinal hernia, very small right inguinal hernia. Both testes descended. Normal size and contour. A scrotal mass.  Musculoskeletal: Normal range of motion. He exhibits no edema and no tenderness.  Lymphadenopathy:    He has no cervical adenopathy.  Neurological: He is alert and oriented to person, place, and time. He has normal reflexes. Coordination normal.  Skin: Skin is warm and dry. No rash noted. He is not diaphoretic. No erythema. No pallor.  Psychiatric: He has a normal mood and affect. His behavior is normal. Judgment and thought content normal.      Data Reviewed   Dr. Perley Jain office notes. Emergency department records. CT scan.   Assessment    Symptomatic left inguinal hernia. Medium-sized.I agree that this is almost certainly the cause of his pain.   Small right inguinal hernia,  asymptomatic   History of an appendectomy as a teenager   Asthma   Coronary artery disease without angina   History of myocardial infarction   Hypertension   Hyperlipidemia   Rosacea      Plan    We spent a long time talking about the anatomy of inguinal hernias, the natural history of inguinal hernias, and the different techniques for repair and the pros and cons of each.  He clearly wants to go ahead and have this done. He also wants to have the right hernia repair at the same time.   He'll be scheduled for laparoscopic repair of bilateral inguinal hernias with mesh, possible open.   I discussed the indications, details, techniques, and numerous risk of the surgery with him and his  wife. He is aware of the risk of bleeding, infection, conversion to open surgery, recurrence of the hernia, nerve damage with chronic pain or numbness, injury to the testicle or bladder or intestine, cardiac pulmonary and thromboembolic problems. He understands all these issues all of his questions were answered.. He agrees with this plan.   We'll asked Dr. Acie Fredrickson for cardiac risk assessment and clearance  preop.           [name]

## 2013-09-29 ENCOUNTER — Encounter (HOSPITAL_COMMUNITY): Payer: Self-pay

## 2013-09-29 ENCOUNTER — Ambulatory Visit (HOSPITAL_COMMUNITY): Payer: Medicare HMO | Admitting: Anesthesiology

## 2013-09-29 ENCOUNTER — Encounter (HOSPITAL_COMMUNITY): Payer: Medicare HMO | Admitting: Anesthesiology

## 2013-09-29 ENCOUNTER — Ambulatory Visit (HOSPITAL_COMMUNITY)
Admission: RE | Admit: 2013-09-29 | Discharge: 2013-09-29 | Disposition: A | Discharge status: Home / Self Care | Payer: Medicare HMO | Source: Ambulatory Visit | Attending: General Surgery | Admitting: General Surgery

## 2013-09-29 ENCOUNTER — Encounter (HOSPITAL_COMMUNITY)
Admission: RE | Disposition: A | Discharge status: Home / Self Care | Payer: Self-pay | Source: Ambulatory Visit | Attending: General Surgery

## 2013-09-29 DIAGNOSIS — L719 Rosacea, unspecified: Secondary | ICD-10-CM | POA: Insufficient documentation

## 2013-09-29 DIAGNOSIS — I10 Essential (primary) hypertension: Secondary | ICD-10-CM | POA: Insufficient documentation

## 2013-09-29 DIAGNOSIS — Z7982 Long term (current) use of aspirin: Secondary | ICD-10-CM | POA: Insufficient documentation

## 2013-09-29 DIAGNOSIS — I359 Nonrheumatic aortic valve disorder, unspecified: Secondary | ICD-10-CM | POA: Insufficient documentation

## 2013-09-29 DIAGNOSIS — I2510 Atherosclerotic heart disease of native coronary artery without angina pectoris: Secondary | ICD-10-CM

## 2013-09-29 DIAGNOSIS — K4020 Bilateral inguinal hernia, without obstruction or gangrene, not specified as recurrent: Secondary | ICD-10-CM | POA: Diagnosis present

## 2013-09-29 DIAGNOSIS — Z792 Long term (current) use of antibiotics: Secondary | ICD-10-CM | POA: Insufficient documentation

## 2013-09-29 DIAGNOSIS — D1779 Benign lipomatous neoplasm of other sites: Secondary | ICD-10-CM | POA: Insufficient documentation

## 2013-09-29 DIAGNOSIS — Z9089 Acquired absence of other organs: Secondary | ICD-10-CM | POA: Insufficient documentation

## 2013-09-29 DIAGNOSIS — I252 Old myocardial infarction: Secondary | ICD-10-CM | POA: Insufficient documentation

## 2013-09-29 DIAGNOSIS — K4090 Unilateral inguinal hernia, without obstruction or gangrene, not specified as recurrent: Secondary | ICD-10-CM | POA: Insufficient documentation

## 2013-09-29 DIAGNOSIS — J45909 Unspecified asthma, uncomplicated: Secondary | ICD-10-CM | POA: Insufficient documentation

## 2013-09-29 DIAGNOSIS — Z79899 Other long term (current) drug therapy: Secondary | ICD-10-CM | POA: Insufficient documentation

## 2013-09-29 DIAGNOSIS — E785 Hyperlipidemia, unspecified: Secondary | ICD-10-CM

## 2013-09-29 DIAGNOSIS — Z9049 Acquired absence of other specified parts of digestive tract: Secondary | ICD-10-CM | POA: Insufficient documentation

## 2013-09-29 HISTORY — PX: INGUINAL HERNIA REPAIR: SHX194

## 2013-09-29 HISTORY — PX: INSERTION OF MESH: SHX5868

## 2013-09-29 SURGERY — REPAIR, HERNIA, INGUINAL, LAPAROSCOPIC
Anesthesia: General | Site: Abdomen | Laterality: Bilateral

## 2013-09-29 MED ORDER — ACETAMINOPHEN 325 MG PO TABS
650.0000 mg | ORAL_TABLET | ORAL | Status: DC | PRN
Start: 2013-09-29 — End: 2013-09-29

## 2013-09-29 MED ORDER — CEFAZOLIN SODIUM-DEXTROSE 2-3 GM-% IV SOLR
INTRAVENOUS | Status: AC
  Filled 2013-09-29: qty 50

## 2013-09-29 MED ORDER — HYDROCODONE-ACETAMINOPHEN 5-325 MG PO TABS: 1.0000 | ORAL_TABLET | ORAL | Status: DC | PRN

## 2013-09-29 MED ORDER — SUFENTANIL CITRATE 50 MCG/ML IV SOLN
INTRAVENOUS | Status: DC | PRN
  Administered 2013-09-29: 10 ug via INTRAVENOUS
  Administered 2013-09-29 (×2): 5 ug via INTRAVENOUS
  Administered 2013-09-29: 10 ug via INTRAVENOUS
  Administered 2013-09-29 (×2): 5 ug via INTRAVENOUS

## 2013-09-29 MED ORDER — PROMETHAZINE HCL 25 MG/ML IJ SOLN
6.2500 mg | INTRAMUSCULAR | Status: DC | PRN
Start: 2013-09-29 — End: 2013-09-29

## 2013-09-29 MED ORDER — NEOSTIGMINE METHYLSULFATE 1 MG/ML IJ SOLN
INTRAMUSCULAR | Status: AC
  Filled 2013-09-29: qty 10

## 2013-09-29 MED ORDER — LACTATED RINGERS IR SOLN
Status: DC | PRN
  Administered 2013-09-29: 1

## 2013-09-29 MED ORDER — LIDOCAINE HCL (CARDIAC) 20 MG/ML IV SOLN
INTRAVENOUS | Status: DC | PRN
  Administered 2013-09-29: 25 mg via INTRAVENOUS
  Administered 2013-09-29: 50 mg via INTRAVENOUS

## 2013-09-29 MED ORDER — SODIUM CHLORIDE 0.9 % IJ SOLN: 3.0000 mL | INTRAMUSCULAR | Status: DC | PRN

## 2013-09-29 MED ORDER — OXYCODONE HCL 5 MG PO TABS: 5.0000 mg | ORAL_TABLET | ORAL | Status: DC | PRN

## 2013-09-29 MED ORDER — SODIUM CHLORIDE 0.9 % IV SOLN: INTRAVENOUS | Status: DC

## 2013-09-29 MED ORDER — LIDOCAINE HCL (CARDIAC) 20 MG/ML IV SOLN
INTRAVENOUS | Status: AC
  Filled 2013-09-29: qty 5

## 2013-09-29 MED ORDER — ATROPINE SULFATE 0.4 MG/ML IJ SOLN
INTRAMUSCULAR | Status: DC | PRN
  Administered 2013-09-29: .4 mg via INTRAVENOUS

## 2013-09-29 MED ORDER — FENTANYL CITRATE 0.05 MG/ML IJ SOLN: 25.0000 ug | INTRAMUSCULAR | Status: DC | PRN

## 2013-09-29 MED ORDER — ROCURONIUM BROMIDE 100 MG/10ML IV SOLN
INTRAVENOUS | Status: AC
  Filled 2013-09-29: qty 1

## 2013-09-29 MED ORDER — LACTATED RINGERS IV SOLN
INTRAVENOUS | Status: DC | PRN
  Administered 2013-09-29 (×2): via INTRAVENOUS

## 2013-09-29 MED ORDER — ACETAMINOPHEN 10 MG/ML IV SOLN
1000.0000 mg | Freq: Once | INTRAVENOUS | Status: AC
Start: 2013-09-29 — End: 2013-09-29
  Administered 2013-09-29: 1000 mg via INTRAVENOUS
  Filled 2013-09-29: qty 100

## 2013-09-29 MED ORDER — KETOROLAC TROMETHAMINE 30 MG/ML IJ SOLN: 15.0000 mg | Freq: Once | INTRAMUSCULAR | Status: DC | PRN

## 2013-09-29 MED ORDER — ACETAMINOPHEN 650 MG RE SUPP
650.0000 mg | RECTAL | Status: DC | PRN
  Filled 2013-09-29: qty 1

## 2013-09-29 MED ORDER — CEFAZOLIN SODIUM-DEXTROSE 2-3 GM-% IV SOLR
2.0000 g | INTRAVENOUS | Status: AC
  Administered 2013-09-29: 2 g via INTRAVENOUS

## 2013-09-29 MED ORDER — HYDROMORPHONE HCL PF 1 MG/ML IJ SOLN: 0.2500 mg | INTRAMUSCULAR | Status: DC | PRN

## 2013-09-29 MED ORDER — SODIUM CHLORIDE 0.9 % IJ SOLN
INTRAMUSCULAR | Status: AC
  Filled 2013-09-29: qty 10

## 2013-09-29 MED ORDER — NEOSTIGMINE METHYLSULFATE 1 MG/ML IJ SOLN
INTRAMUSCULAR | Status: DC | PRN
  Administered 2013-09-29: 3 mg via INTRAVENOUS

## 2013-09-29 MED ORDER — PROPOFOL 10 MG/ML IV BOLUS
INTRAVENOUS | Status: DC | PRN
  Administered 2013-09-29: 170 mg via INTRAVENOUS

## 2013-09-29 MED ORDER — SODIUM CHLORIDE 0.9 % IV SOLN: 250.0000 mL | INTRAVENOUS | Status: DC | PRN

## 2013-09-29 MED ORDER — GLYCOPYRROLATE 0.2 MG/ML IJ SOLN
INTRAMUSCULAR | Status: AC
  Filled 2013-09-29: qty 4

## 2013-09-29 MED ORDER — GLYCOPYRROLATE 0.2 MG/ML IJ SOLN
INTRAMUSCULAR | Status: DC | PRN
  Administered 2013-09-29: 0.2 mg via INTRAVENOUS
  Administered 2013-09-29: 0.6 mg via INTRAVENOUS

## 2013-09-29 MED ORDER — DEXAMETHASONE SODIUM PHOSPHATE 10 MG/ML IJ SOLN
INTRAMUSCULAR | Status: AC
  Filled 2013-09-29: qty 1

## 2013-09-29 MED ORDER — ONDANSETRON HCL 4 MG/2ML IJ SOLN: 4.0000 mg | Freq: Four times a day (QID) | INTRAMUSCULAR | Status: DC | PRN

## 2013-09-29 MED ORDER — ONDANSETRON HCL 4 MG/2ML IJ SOLN
INTRAMUSCULAR | Status: AC
  Filled 2013-09-29: qty 2

## 2013-09-29 MED ORDER — ROCURONIUM BROMIDE 100 MG/10ML IV SOLN
INTRAVENOUS | Status: DC | PRN
  Administered 2013-09-29: 45 mg via INTRAVENOUS
  Administered 2013-09-29: 5 mg via INTRAVENOUS

## 2013-09-29 MED ORDER — 0.9 % SODIUM CHLORIDE (POUR BTL) OPTIME
TOPICAL | Status: DC | PRN
  Administered 2013-09-29: 1000 mL

## 2013-09-29 MED ORDER — SODIUM CHLORIDE 0.9 % IJ SOLN: 3.0000 mL | Freq: Two times a day (BID) | INTRAMUSCULAR | Status: DC

## 2013-09-29 MED ORDER — BUPIVACAINE-EPINEPHRINE 0.5% -1:200000 IJ SOLN
INTRAMUSCULAR | Status: DC | PRN
  Administered 2013-09-29: 13 mL

## 2013-09-29 MED ORDER — SUFENTANIL CITRATE 50 MCG/ML IV SOLN
INTRAVENOUS | Status: AC
  Filled 2013-09-29: qty 1

## 2013-09-29 MED ORDER — PROPOFOL 10 MG/ML IV BOLUS
INTRAVENOUS | Status: AC
  Filled 2013-09-29: qty 20

## 2013-09-29 MED ORDER — DEXAMETHASONE SODIUM PHOSPHATE 10 MG/ML IJ SOLN
INTRAMUSCULAR | Status: DC | PRN
  Administered 2013-09-29: 10 mg via INTRAVENOUS

## 2013-09-29 MED ORDER — ONDANSETRON HCL 4 MG/2ML IJ SOLN
INTRAMUSCULAR | Status: DC | PRN
Start: 2013-09-29 — End: 2013-09-29
  Administered 2013-09-29: 4 mg via INTRAVENOUS

## 2013-09-29 MED ORDER — NIACIN ER (ANTIHYPERLIPIDEMIC) 500 MG PO TBCR: 500.0000 mg | EXTENDED_RELEASE_TABLET | Freq: Every day | ORAL | Status: DC

## 2013-09-29 MED ORDER — MIDAZOLAM HCL 2 MG/2ML IJ SOLN
INTRAMUSCULAR | Status: AC
  Filled 2013-09-29: qty 2

## 2013-09-29 MED ORDER — BUPIVACAINE-EPINEPHRINE PF 0.5-1:200000 % IJ SOLN
INTRAMUSCULAR | Status: AC
  Filled 2013-09-29: qty 30

## 2013-09-29 SURGICAL SUPPLY — 39 items
ADH SKN CLS APL DERMABOND .7 (GAUZE/BANDAGES/DRESSINGS) ×2
APL SKNCLS STERI-STRIP NONHPOA (GAUZE/BANDAGES/DRESSINGS)
APPLIER CLIP LOGIC TI 5 (MISCELLANEOUS) ×1 IMPLANT
APR CLP MED LRG 33X5 (MISCELLANEOUS) ×2
BENZOIN TINCTURE PRP APPL 2/3 (GAUZE/BANDAGES/DRESSINGS) IMPLANT
CABLE HIGH FREQUENCY MONO STRZ (ELECTRODE) IMPLANT
CHLORAPREP W/TINT 26ML (MISCELLANEOUS) ×1 IMPLANT
DECANTER SPIKE VIAL GLASS SM (MISCELLANEOUS) ×2 IMPLANT
DERMABOND ADVANCED (GAUZE/BANDAGES/DRESSINGS) ×1
DERMABOND ADVANCED .7 DNX12 (GAUZE/BANDAGES/DRESSINGS) IMPLANT
DISSECT BALLN SPACEMKR + OVL (BALLOONS) ×3
DISSECTOR BALLN SPACEMKR + OVL (BALLOONS) ×2 IMPLANT
DISSECTOR BLUNT TIP ENDO 5MM (MISCELLANEOUS) IMPLANT
DRAPE LAPAROSCOPIC ABDOMINAL (DRAPES) ×3 IMPLANT
DRAPE WARM FLUID 44X44 (DRAPE) ×1 IMPLANT
ELECT REM PT RETURN 9FT ADLT (ELECTROSURGICAL) ×3
ELECTRODE REM PT RTRN 9FT ADLT (ELECTROSURGICAL) ×2 IMPLANT
GLOVE EUDERMIC 7 POWDERFREE (GLOVE) ×3 IMPLANT
GOWN STRL REUS W/TWL XL LVL3 (GOWN DISPOSABLE) ×9 IMPLANT
KIT BASIN OR (CUSTOM PROCEDURE TRAY) ×3 IMPLANT
MESH 3DMAX 4X6 LT LRG (Mesh General) ×1 IMPLANT
MESH 3DMAX LIGHT 4.1X6.2 RT LR (Mesh General) ×1 IMPLANT
NDL INSUFFLATION 14GA 120MM (NEEDLE) IMPLANT
NEEDLE INSUFFLATION 14GA 120MM (NEEDLE) ×3 IMPLANT
SCISSORS LAP 5X35 DISP (ENDOMECHANICALS) IMPLANT
SET IRRIG TUBING LAPAROSCOPIC (IRRIGATION / IRRIGATOR) ×1 IMPLANT
SOLUTION ANTI FOG 6CC (MISCELLANEOUS) ×1 IMPLANT
STOPCOCK 4 WAY LG BORE MALE ST (IV SETS) ×3 IMPLANT
STRIP CLOSURE SKIN 1/2X4 (GAUZE/BANDAGES/DRESSINGS) IMPLANT
SUT MNCRL AB 4-0 PS2 18 (SUTURE) ×3 IMPLANT
SUT VIC AB 3-0 SH 27 (SUTURE)
SUT VIC AB 3-0 SH 27XBRD (SUTURE) IMPLANT
TACKER 5MM HERNIA 3.5CML NAB (ENDOMECHANICALS) IMPLANT
TOWEL OR 17X26 10 PK STRL BLUE (TOWEL DISPOSABLE) ×3 IMPLANT
TOWEL OR NON WOVEN STRL DISP B (DISPOSABLE) ×3 IMPLANT
TRAY FOLEY CATH 16FRSI W/METER (SET/KITS/TRAYS/PACK) ×1 IMPLANT
TRAY LAP CHOLE (CUSTOM PROCEDURE TRAY) ×3 IMPLANT
TROCAR CANNULA W/PORT DUAL 5MM (MISCELLANEOUS) ×3 IMPLANT
TUBING INSUFFLATION 10FT LAP (TUBING) ×3 IMPLANT

## 2013-09-29 NOTE — Interval H&P Note (Signed)
History and Physical Interval Note:  09/29/2013 12:23 PM  Jeffrey Stewart  has presented today for surgery, with the diagnosis of bilateral inguinal hernias  The goals and the  various methods of treatment have been discussed with the patient and family. After consideration of risks, benefits and other options for treatment, the patient has consented to  Procedure(s): LAPAROSCOPIC BILATERAL  INGUINAL HERNIA, POSSIBLE OPEN (Bilateral) INSERTION OF MESH (Bilateral) as a surgical intervention .  The patient's history has been reviewed, patient examined, no change in status, stable for surgery.  I have reviewed the patient's chart and labs.  Questions were answered to the patient's satisfaction.     [name]

## 2013-09-29 NOTE — Anesthesia Preprocedure Evaluation (Addendum)
Anesthesia Evaluation  Patient identified by MRN, date of birth, ID band Patient awake    Reviewed: Allergy & Precautions, H&P , NPO status , Patient's Chart, lab work & pertinent test results  Airway Mallampati: II TM Distance: >3 FB Neck ROM: Full    Dental no notable dental hx.    Pulmonary asthma ,  breath sounds clear to auscultation  + decreased breath sounds+ wheezing      Cardiovascular + CAD and +CHF Rhythm:Regular Rate:Normal  LV EF: 50% -   55%    Neuro/Psych negative neurological ROS  negative psych ROS   GI/Hepatic negative GI ROS, Neg liver ROS,   Endo/Other  negative endocrine ROS  Renal/GU negative Renal ROS  negative genitourinary   Musculoskeletal negative musculoskeletal ROS (+)   Abdominal   Peds negative pediatric ROS (+)  Hematology negative hematology ROS (+)   Anesthesia Other Findings   Reproductive/Obstetrics negative OB ROS                        Anesthesia Physical Anesthesia Plan  ASA: III  Anesthesia Plan: General   Post-op Pain Management:    Induction: Intravenous  Airway Management Planned: Oral ETT  Additional Equipment:   Intra-op Plan:   Post-operative Plan: Extubation in OR  Informed Consent: I have reviewed the patients History and Physical, chart, labs and discussed the procedure including the risks, benefits and alternatives for the proposed anesthesia with the patient or authorized representative who has indicated his/her understanding and acceptance.   Dental advisory given  Plan Discussed with: CRNA and Surgeon  Anesthesia Plan Comments:         Anesthesia Quick Evaluation

## 2013-09-29 NOTE — Op Note (Signed)
Patient Name:           Jeffrey Stewart   Date of Surgery:        09/29/2013  Pre op Diagnosis:      Bilateral inguinal hernias  Post op Diagnosis:    Bilateral inguinal hernias  Procedure:                 Laparoscopic, preperitoneal repair of bilateral inguinal hernias with mesh  Surgeon:                     Edsel Petrin. Dalbert Batman, M.D., FACS  Assistant:                      None  Operative Indications:   Jeffrey Stewart is a 79 y.o. male. He was referred by Dr. Lizbeth Bark for evaluation of bilateral inguinal hernias and abdominal pain. Dr. Tollie Pizza is his PCP. Dr. Acie Fredrickson is his cardiologist.  The patient states that he still works at the [employer] and walks a lot, but no strenuous work. He gives a three-week history of left groin pain. He points to the left inguinal area extending toward the scrotum and pubic tubercle. This hurts him mostly when he walks. He had one episode of severe pain and nausea and went to the emergency room. Evaluation was negative except for a CT scan showed bilateral inguinal hernias, small, containing fat only. The pain is intermittent and usually related to walking. He saw Dr. Watt Climes who noted that he had a normal colonoscopy 4 1/2 years ago. He thought that the hernias were the cause of his pain. He also recently saw Dr. Acie Fredrickson for his annual checkup and was told everything seemed to be going well. An echocardiogram was recently done. On my examination he has a medium-sized left inguinal hernia that is reducible and a smaller right inguinal hernia that is reducible. He is brought to the operating room electively. Comorbidities include coronary artery disease, status post myocardial infarction. No angina. A cardiac catheter but apparently no stents or surgery. Hyperlipidemia. Hypertension. Asthma. Open appendectomy age 57. He is on antibiotics chronically for rosacea he says he has a leaking aortic valve.   Operative Findings:       The patient had a medium to large  indirect left inguinal hernia. On the right side he had a large lipoma that I pulled out of the inguinal canal and may have had a little bit of weakness in the direct space. However, on the right side, he did not have an indirect hernia. I found the peritoneum on the right side well back from the internal ring.  Procedure in Detail:          Following the induction of general endotracheal anesthesia a Foley catheter was inserted and the bladder was emptied. The abdomen and genitalia were prepped and draped in a sterile fashion. Intravenous antibiotics were given. Surgical time out was performed. 0.5% Marcaine with epinephrine was used as a local infiltration anesthetic.      A transverse incision was made just below the umbilicus. The fascia was incised transversely exposing the medial border of the left rectus muscle. The spacemaker balloon was inserted in the midline in the left rectus sheath. Videocamera was inserted. The balloon was inflated under direct vision and I had good visualization of the rectus muscles anteriorly, pre-peritoneal fat posteriorly, and Cooper's ligaments bilaterally. I inserted a 5 mm trocar in the midline below the  umbilicus.I pulled the peritoneum down off of the right side and then placed a 5 mm trocar in the right lower quadrant.  I then dissected the left inguinal hernia. I pulled the peritoneum down off on the left side. A large indirect hernia sac was pulled out of the inguinal canal and dissected back off of the cord structures well above the anterior superior iliac spine.  I had to divide the inferior epigastric vessels between metal clips because it had been pulled off of the rectus muscles by the dissector balloon.      I placed a 5 mm trocar and left lower quadrant and then dissected the right inguinal hernia. He had a large lipoma of the right inguinal canal which I dissected completely out. I inspected the cord structures and he did not have an indirect hernia on the  right side. There was a clear-cut weakness in the direct space on the right side, however and I debrided some fat from that.      On the right side I repaired the hernia with a 3-D Max mesh, large size, thin mesh. This was positioned so as to cross the midline slightly, overlap Cooper's ligament slightly, and deployed laterally. It was secured with a few of the 5 mm protack screws. Along the midline, across the superior border of the Cooper's ligament, and I placed a few tacks laterally. The lateral tacks were carefully placed so as to palpate through the abdominal wall and to stay above the iliopubic tract.      On the left side I used a 3-D Max mesh, large size, standard mesh thickness. This was positioned in similar fashion to overlap  the midline slightly, and cover the defects and deploy laterally. It was secured in a similar fashion. The repairs were inspected. They looked very good. The lipoma on the right side was above the edge of the mesh. The indirect hernia sac on the left was above the edge of the mesh. These were held onto the posterior surfaces of the mesh as the pneumoperitoneum was released. After the pneumoperitoneum was released trocars were removed. He had a pneumoperitoneum above and I released this in the right subcostal area with a Veress needle. This worked well. The fascia at the umbilicus was closed with interrupted figure-of-eight sutures of 0 Vicryl. All the skin incisions were closed with subcuticular stitches of 4-0 Monocryl and Dermabond. The patient tolerated the procedure well was taken to PACU in stable condition. EBL 20 cc. Counts correct. Consultations none.    Edsel Petrin. Dalbert Batman, M.D., FACS General and Minimally Invasive Surgery Breast and Colorectal Surgery  09/29/2013 2:22 PM

## 2013-09-29 NOTE — Discharge Instructions (Signed)
[facility] _______[facility]  UMBILICAL OR INGUINAL HERNIA REPAIR: POST OP INSTRUCTIONS  Always review your discharge instruction sheet given to you by the facility where your surgery was performed. IF YOU HAVE DISABILITY OR FAMILY LEAVE FORMS, YOU MUST BRING THEM TO THE OFFICE FOR PROCESSING.   DO NOT GIVE THEM TO YOUR DOCTOR.  1. A  prescription for pain medication may be given to you upon discharge.  Take your pain medication as prescribed, if needed.  If narcotic pain medicine is not needed, then you may take acetaminophen (Tylenol) or ibuprofen (Advil) as needed. 2. Take your usually prescribed medications unless otherwise directed. 3. If you need a refill on your pain medication, please contact your pharmacy.  They will contact our office to request authorization. Prescriptions will not be filled after 5 pm or on week-ends. 4. You should follow a light diet the first 24 hours after arrival home, such as soup and crackers, etc.  Be sure to include lots of fluids daily.  Resume your normal diet the day after surgery. 5. Most patients will experience some swelling and bruising around the umbilicus or in the groin and scrotum.  Ice packs and reclining will help.  Swelling and bruising can take several days to resolve.  6. It is common to experience some constipation if taking pain medication after surgery.  Increasing fluid intake and taking a stool softener (such as Colace) will usually help or prevent this problem from occurring.  A mild laxative (Milk of Magnesia or Miralax) should be taken according to package directions if there are no bowel movements after 48 hours. 7. Unless discharge instructions indicate otherwise, you may remove your bandages 24-48 hours after surgery, and you may shower at that time.  You may have steri-strips (small skin tapes) in place directly over the incision.  These strips should be left on the skin for 7-10 days.  If your surgeon used skin glue on the  incision, you may shower in 24 hours.  The glue will flake off over the next 2-3 weeks.  Any sutures or staples will be removed at the office during your follow-up visit. 8. ACTIVITIES:  You may resume regular (light) daily activities beginning the next day--such as daily self-care, walking, climbing stairs--gradually increasing activities as tolerated.  You may have sexual intercourse when it is comfortable.  Refrain from any heavy lifting or straining until approved by your doctor. a. You may drive when you are no longer taking prescription pain medication, you can comfortably wear a seatbelt, and you can safely maneuver your car and apply brakes. b. RETURN TO WORK:  __________________________________________________________ 9. You should see your doctor in the office for a follow-up appointment approximately 2-3 weeks after your surgery.  Make sure that you call for this appointment within a day or two after you arrive home to insure a convenient appointment time. 10. OTHER INSTRUCTIONS:  __________________________________________________________________________________________________________________________________________________________________________________________  WHEN TO CALL YOUR DOCTOR: 1. Fever over 101.0 2. Inability to urinate 3. Nausea and/or vomiting 4. Extreme swelling or bruising 5. Continued bleeding from incision. 6. Increased pain, redness, or drainage from the incision  The clinic staff is available to answer your questions during regular business hours.  Please don't hesitate to call and ask to speak to one of the nurses for clinical concerns.  If you have a medical emergency, go to the nearest emergency room or call 911.  A surgeon from [facility] is always on call at the hospital     [address], [address], [place], [place]  [address] ?  [address] [address], [place], [place]   [address] [phone] ? [phone] ? FAX [phone] Web site:  [email or web address]  

## 2013-09-29 NOTE — Transfer of Care (Signed)
Immediate Anesthesia Transfer of Care Note  Patient: Jeffrey Stewart  Procedure(s) Performed: Procedure(s): LAPAROSCOPIC BILATERAL  INGUINAL HERNIA (Bilateral) INSERTION OF MESH (Bilateral)  Patient Location: PACU  Anesthesia Type:General  Level of Consciousness: awake, alert , oriented and patient cooperative  Airway & Oxygen Therapy: Patient Spontanous Breathing and Patient connected to face mask oxygen  Post-op Assessment: Report given to PACU RN, Post -op Vital signs reviewed and stable and Patient moving all extremities X 4  Post vital signs: stable  Complications: No apparent anesthesia complications

## 2013-09-29 NOTE — Anesthesia Postprocedure Evaluation (Signed)
  Anesthesia Post-op Note  Patient: Jeffrey Stewart  Procedure(s) Performed: Procedure(s) (LRB): LAPAROSCOPIC BILATERAL  INGUINAL HERNIA (Bilateral) INSERTION OF MESH (Bilateral)  Patient Location: PACU  Anesthesia Type: General  Level of Consciousness: awake and alert   Airway and Oxygen Therapy: Patient Spontanous Breathing  Post-op Pain: mild  Post-op Assessment: Post-op Vital signs reviewed, Patient's Cardiovascular Status Stable, Respiratory Function Stable, Patent Airway and No signs of Nausea or vomiting  Last Vitals:  Filed Vitals:   09/29/13 1554  BP:   Pulse:   Temp:   Resp: 16    Post-op Vital Signs: stable   Complications: No apparent anesthesia complications

## 2013-09-30 ENCOUNTER — Encounter (HOSPITAL_COMMUNITY): Payer: Self-pay | Admitting: General Surgery

## 2013-10-22 ENCOUNTER — Encounter (INDEPENDENT_AMBULATORY_CARE_PROVIDER_SITE_OTHER): Payer: Self-pay | Admitting: General Surgery

## 2013-10-22 ENCOUNTER — Ambulatory Visit (INDEPENDENT_AMBULATORY_CARE_PROVIDER_SITE_OTHER): Payer: Medicare HMO | Admitting: General Surgery

## 2013-10-22 VITALS — BP 122/80 | HR 68 | Temp 97.5°F | Ht 72.0 in | Wt 205.6 lb

## 2013-10-22 DIAGNOSIS — K4020 Bilateral inguinal hernia, without obstruction or gangrene, not specified as recurrent: Secondary | ICD-10-CM

## 2013-10-22 NOTE — Progress Notes (Signed)
Patient ID: Jeffrey Stewart, male   DOB: 01-22-1935, 79 y.o.   MRN: [id number] History:  This man underwent laparoscopic preperitoneal repair of bilateral inguinal hernias with mesh on March 16. He has done well. Had a good deal of bruising and swelling but this is subsiding. Just feels a little thickening along the left cord structures. Minimal pain. Full activities. Normal bowel activity  Exam: Patient looks well. No distress Examed  standing umbilicus normal. Inguinal hernia repairs intact. No seroma or infection. No hematoma. Scrotum and  testes normal. Left cord structures a little bit thickened but nontender  Assessment: Bilateral inguinal hernias, recovering uneventfully following laparoscopic repair with mesh  Plan: Diet and activities discussed Normal activities after May 1 Return as needed.   Edsel Petrin. Dalbert Batman, M.D., [facility] General and Minimally invasive Surgery Breast and Colorectal Surgery Office:   [phone] Pager:   [phone]

## 2013-10-22 NOTE — Patient Instructions (Signed)
You are recovering from your laparoscopic bilateral inguinal hernia repair with mesh without any obvious surgical complications. The hernia repairs appear intact.  Walk for 30-45 minutes a day  Resume normal activities without restriction after May 1  Return to see Dr. Dalbert Batman as needed.

## 2013-11-25 ENCOUNTER — Other Ambulatory Visit: Payer: Self-pay | Admitting: Cardiovascular Disease

## 2014-01-05 ENCOUNTER — Other Ambulatory Visit: Payer: Self-pay | Admitting: *Deleted

## 2014-01-05 MED ORDER — LISINOPRIL 5 MG PO TABS: ORAL_TABLET | ORAL | Status: DC

## 2014-01-23 ENCOUNTER — Other Ambulatory Visit: Payer: Self-pay | Admitting: Cardiovascular Disease

## 2014-03-19 ENCOUNTER — Ambulatory Visit (INDEPENDENT_AMBULATORY_CARE_PROVIDER_SITE_OTHER): Payer: Medicare HMO | Admitting: Cardiovascular Disease

## 2014-03-19 ENCOUNTER — Encounter: Payer: Self-pay | Admitting: Cardiovascular Disease

## 2014-03-19 VITALS — BP 110/68 | HR 64 | Ht 72.0 in | Wt 200.4 lb

## 2014-03-19 DIAGNOSIS — E785 Hyperlipidemia, unspecified: Secondary | ICD-10-CM

## 2014-03-19 LAB — BASIC METABOLIC PANEL
BUN: 12 mg/dL (ref 6–23)
CALCIUM: 9.2 mg/dL (ref 8.4–10.5)
CO2: 27 mEq/L (ref 19–32)
Chloride: 97 mEq/L (ref 96–112)
Creatinine, Ser: 0.9 mg/dL (ref 0.4–1.5)
GFR: 83.74 mL/min (ref >60.00)
Glucose, Bld: 99 mg/dL (ref 70–99)
POTASSIUM: 4.6 meq/L (ref 3.5–5.1)
Sodium: 129 mEq/L — ABNORMAL LOW (ref 135–145)

## 2014-03-19 LAB — LIPID PANEL
CHOL/HDL RATIO: 3
Cholesterol: 91 mg/dL (ref 0–200)
HDL: 28.9 mg/dL — ABNORMAL LOW (ref >39.00)
LDL CALC: 41 mg/dL (ref 0–99)
NONHDL: 62.1
Triglycerides: 104 mg/dL (ref 0.0–149.0)
VLDL: 20.8 mg/dL (ref 0.0–40.0)

## 2014-03-19 LAB — HEPATIC FUNCTION PANEL
ALBUMIN: 4 g/dL (ref 3.5–5.2)
ALK PHOS: 58 U/L (ref 39–117)
ALT: 15 U/L (ref 0–53)
AST: 19 U/L (ref 0–37)
BILIRUBIN DIRECT: 0.2 mg/dL (ref 0.0–0.3)
BILIRUBIN TOTAL: 1.6 mg/dL — AB (ref 0.2–1.2)
Total Protein: 6.5 g/dL (ref 6.0–8.3)

## 2014-03-19 MED ORDER — LISINOPRIL 5 MG PO TABS: ORAL_TABLET | ORAL | Status: DC

## 2014-03-19 NOTE — Patient Instructions (Signed)
Your physician recommends that you get lab work: TODAY - cholesterol, liver, basic metabolic panels   Your physician wants you to follow-up in: 6 months with Dr. Acie Fredrickson.  You will receive a reminder letter in the mail two months in advance. If you don't receive a letter, please call our office to schedule the follow-up appointment.  Your physician recommends that you return for lab work in: 6 months on the day of or a few days before your office visit with Dr. Acie Fredrickson.  You will need to FAST for this appointment - nothing to eat or drink after midnight the night before except water.   Your physician recommends that you continue on your current medications as directed. Please refer to the Current Medication list given to you today.

## 2014-03-19 NOTE — Progress Notes (Signed)
Jeffrey Stewart Date of Birth  03-12-1935 [facility] [address]    [place], [place]  [address] [phone]  Fax  [phone]  Problem list: 1. Congestive heart failure-chronic systolic congestive heart failure 2. RBBB 3. Mild coronary artery disease-tight stenosis in the distal left circumflex artery 4. Hyperlipidemia  History of Present Illness:  79 yo gentleman with a hx of mild CHF.     He walks occasionally.  He has been exercising on a regular basis. He works out in his garden and walks on his treadmill in the evenings.   He's not had any significant episodes of chest pain or shortness breath.   Feb. 6, 2014  Jeffrey Stewart is doing well since I last saw him. He's not had any episodes of chest pain or shortness breath. He's exercising on a regular basis.  March 10, 2013:  Jeffrey Stewart is doing well. He's not had any episodes of chest pain or shortness breath.  Playing golf, fishing, gardening, boating this past summer.   Tries to avoid salt.  No dypsnea.    Feb. 24, 2015:  Pt is doing OK.  No CP.  He has some abdominal issues - has 2 hernias.  Will be seen the surgeons today.  Sept. 3, 2015:  Jeffrey Stewart is doing well.  Had his hernias repaired No CP, no dyspnea.  Staying active.  Walks regularly.   Current Outpatient Prescriptions on File Prior to Visit  Medication Sig Dispense Refill  . acetaminophen (TYLENOL) 500 MG tablet Take 1,000 mg by mouth every 6 (six) hours as needed for moderate pain.      Marland Kitchen albuterol (PROVENTIL HFA;VENTOLIN HFA) 108 (90 BASE) MCG/ACT inhaler Inhale 1 puff into the lungs every 6 (six) hours as needed for wheezing.       Marland Kitchen aspirin EC 81 MG tablet Take 81 mg by mouth every evening.      Marland Kitchen atorvastatin (LIPITOR) 40 MG tablet Take 40 mg by mouth every evening.      . carvedilol (COREG) 12.5 MG tablet TAKE 1 TABLET TWICE DAILY WITH MEALS  180 tablet  0  . cetirizine (ZYRTEC) 10 MG tablet Take 10 mg by mouth at bedtime.       . cholecalciferol  (VITAMIN D) 1000 UNITS tablet Take 1,000 Units by mouth daily.      . fish oil-omega-3 fatty acids 1000 MG capsule Take 1 g by mouth 3 (three) times daily.       Marland Kitchen GLUCOSAMINE PO Take 1 tablet by mouth 2 (two) times daily.       Marland Kitchen HYDROcodone-acetaminophen (NORCO/VICODIN) 5-325 MG per tablet Take 1-2 tablets by mouth every 4 (four) hours as needed.  30 tablet  0  . lisinopril (PRINIVIL,ZESTRIL) 5 MG tablet TAKE 1 TABLET EVERY DAY  90 tablet  0  . metroNIDAZOLE (METROCREAM) 0.75 % cream Apply 1 application topically 2 (two) times daily.       . minocycline (DYNACIN) 50 MG tablet Take 50 mg by mouth 2 (two) times daily.      . mometasone (ASMANEX) 220 MCG/INH inhaler Inhale 1 puff into the lungs at bedtime.       Marland Kitchen Neomycin-Bacitracin-Polymyxin (HCA TRIPLE ANTIBIOTIC OINTMENT EX) as needed.       Marland Kitchen Neomycin-Polymyxin-HC (CORTISPORIN OT) Place 1 drop in ear(s) 2 (two) times daily as needed. Ear infection      . niacin (NIASPAN) 500 MG CR tablet Take 1 tablet (500 mg total) by mouth at bedtime.  90 tablet  3  . nitroGLYCERIN (NITROSTAT) 0.4 MG SL tablet Place 0.4 mg under the tongue every 5 (five) minutes as needed for chest pain.      Vladimir Faster Glycol-Propyl Glycol (SYSTANE) 0.4-0.3 % SOLN Place 1 drop into both eyes 2 (two) times daily as needed (dry eyes).      . sodium chloride (OCEAN) 0.65 % nasal spray        No current facility-administered medications on file prior to visit.    Allergies  Allergen Reactions  . Niaspan [Niacin]     Intolerance  Flush feeling WHEN ON LARGE DOSE - IS ABLE TO TAKE SMALLER DOSAGE WITHOUT PROB    Past Medical History  Diagnosis Date  . Coronary artery disease   . CHF (congestive heart failure) 11/2009    EF45-50%  . Hyperlipidemia   . Allergy   . Asthma   . Cataract   . Myocardial infarction   . Shortness of breath     SOMETIMES SOB OR WHEEZING WITH EXERTION  . Pneumonia     HX OF PNEUMONIA 4 OR 5 YRS AGO  . Arthritis     MINOR ARTHRITIS FEET  AND TOES  . Rosacea     OF FACE    Past Surgical History  Procedure Laterality Date  . Last echo 02/2009    . Last nuc 2009  12/16/2007    EF 33%. ABNORMAL. 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 Electrocardiogram  11/2009    RBBB  . Cardiac catheterization  11/2009    mild / mod cad,MODERATE TIGHT STENOSIS IN THE DISTAL LEFT CIRCUMFLEX ARTERY  . US echocardiography  02/23/2009    EF 45-50%  . US echocardiography  12/27/2007    EF 45-50%  . Eye surgery      BILATERAL CATARACT EXTRACT EXTRACTION WITH IMPLANTS  . Appendectomy      AGE 29  . Inguinal hernia repair Bilateral 09/29/2013    Procedure: LAPAROSCOPIC BILATERAL  INGUINAL HERNIA;  Surgeon: [name], MD;  Location: [facility];  Service: General;  Laterality: Bilateral;  . Insertion of mesh Bilateral 09/29/2013    Procedure: INSERTION OF MESH;  Surgeon: [name], MD;  Location: [facility];  Service: General;  Laterality: Bilateral;    History  Smoking status  . Never Smoker   Smokeless tobacco  . Never Used    History  Alcohol Use No    No family history on file.  Reviw of Systems:  Reviewed in the HPI.  All other systems are negative.  Physical Exam: BP 110/68  Pulse 64  Ht 6' (1.829 m)  Wt 200 lb 6.4 oz (90.901 kg)  BMI 27.17 kg/m2 The patient is alert and oriented x 3.  The mood and affect are normal.   Skin: warm and dry.  Color is normal.    HEENT:   His neck is supple. There are no carotid bruits. There is no JVD.  Lungs: His lungs are clear.   Heart: Regular rate. There are no murmurs    Abdomen: His abdomen is mildly obese. There is no hepatosplenomegaly.  Extremities:  His no clubbing cyanosis or edema.  His lower legs are discolored.  Neuro:  Exam is nonfocal. His gait is normal.    ECG: Feb. 24, 2015:  NSR at 69.  LAD, RBBB,  Assessment / Plan:

## 2014-05-14 ENCOUNTER — Other Ambulatory Visit: Payer: Self-pay | Admitting: Cardiovascular Disease

## 2014-05-15 ENCOUNTER — Other Ambulatory Visit: Payer: Self-pay | Admitting: Cardiovascular Disease

## 2014-05-27 ENCOUNTER — Telehealth: Payer: Self-pay | Admitting: Cardiovascular Disease

## 2014-05-27 NOTE — Telephone Encounter (Signed)
New problem   Pt need orders for labs.appointment on 09/14/13

## 2014-05-28 ENCOUNTER — Other Ambulatory Visit: Payer: Self-pay | Admitting: Nurse Practitioner

## 2014-05-28 DIAGNOSIS — E785 Hyperlipidemia, unspecified: Secondary | ICD-10-CM

## 2014-05-28 NOTE — Telephone Encounter (Signed)
Orders for fasting labs in epic and linked with patient's appointment

## 2014-06-24 IMAGING — CR DG CHEST 2V
2 series · 2 of 2 positions shown · non-contrast
Comparison: none

[w chest pa]
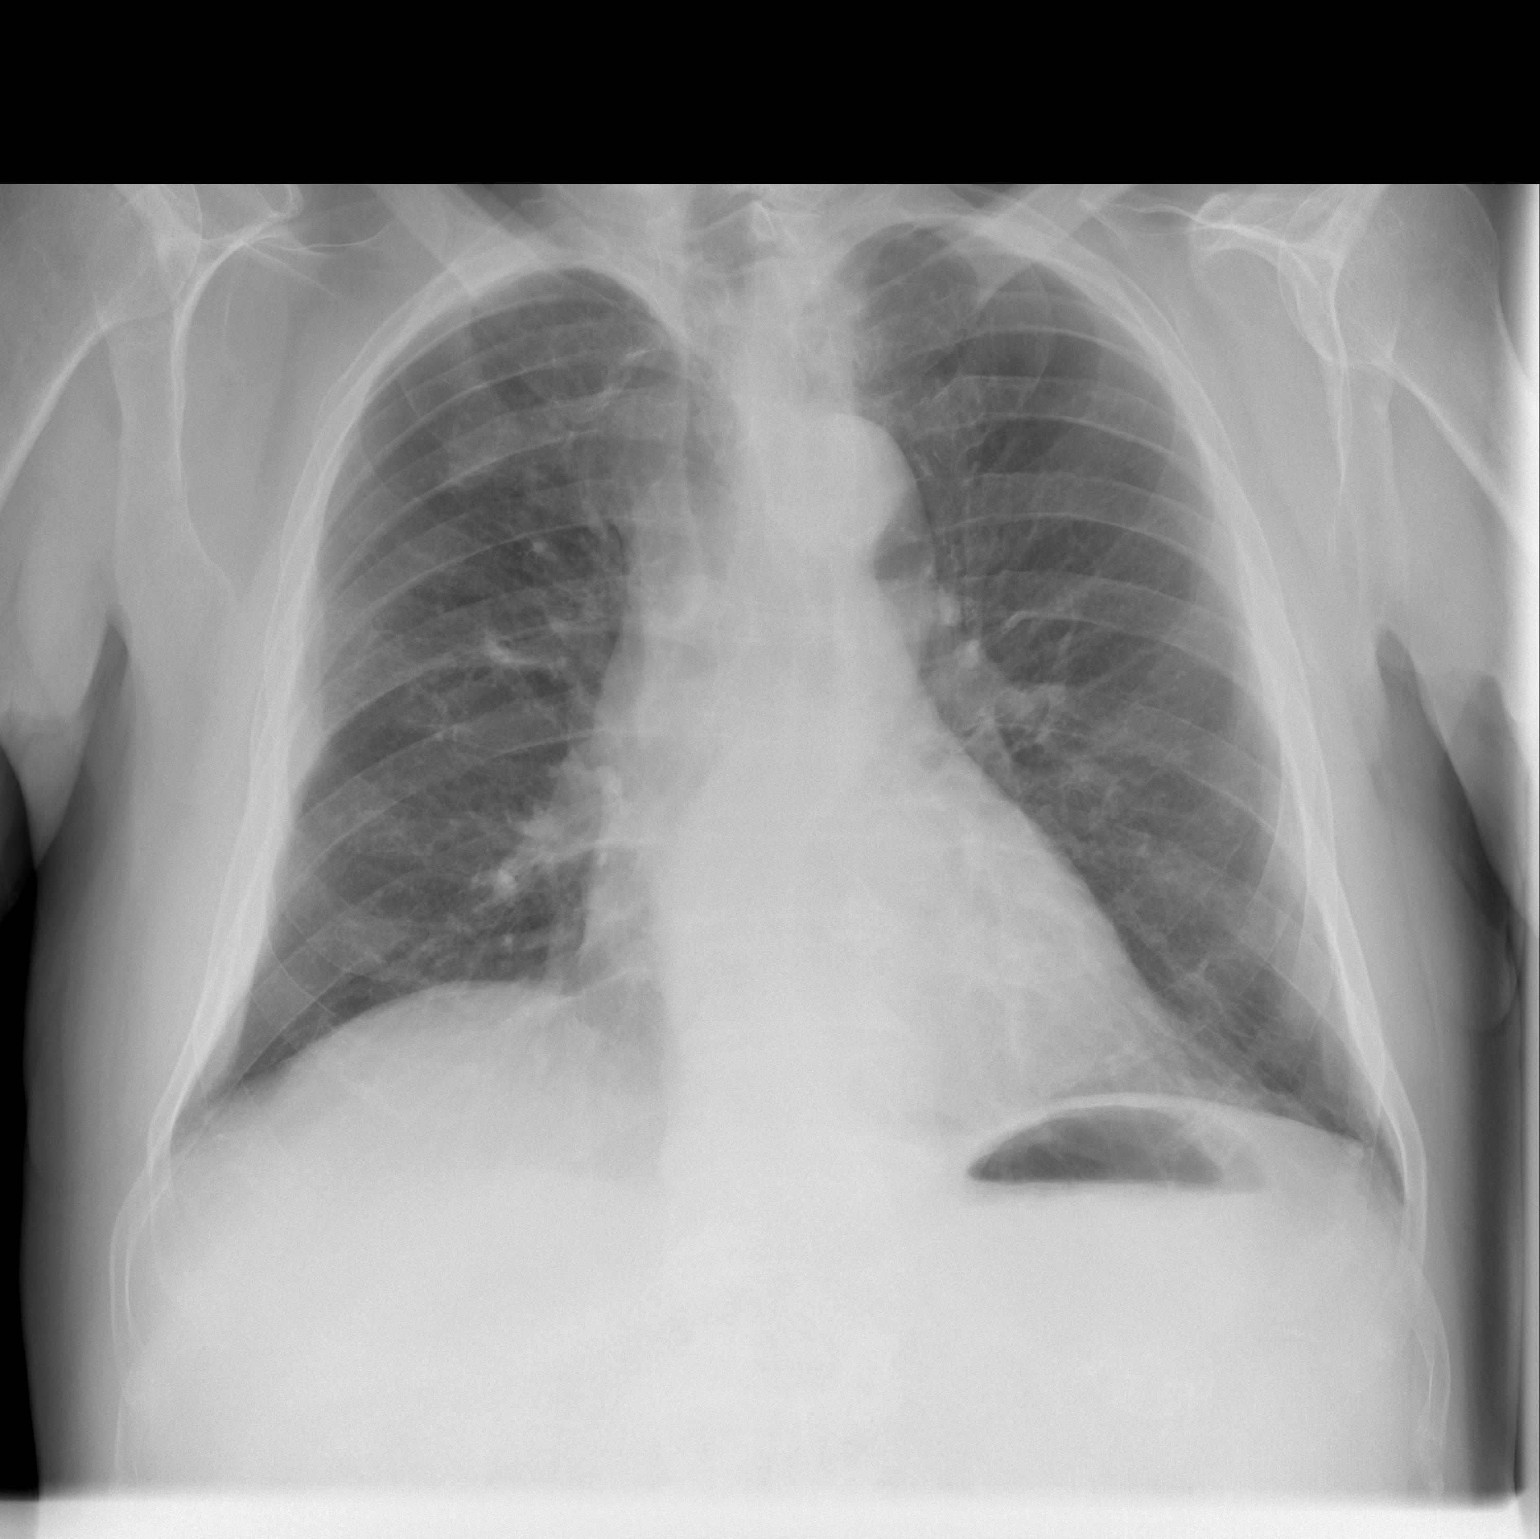

[w chest lat]
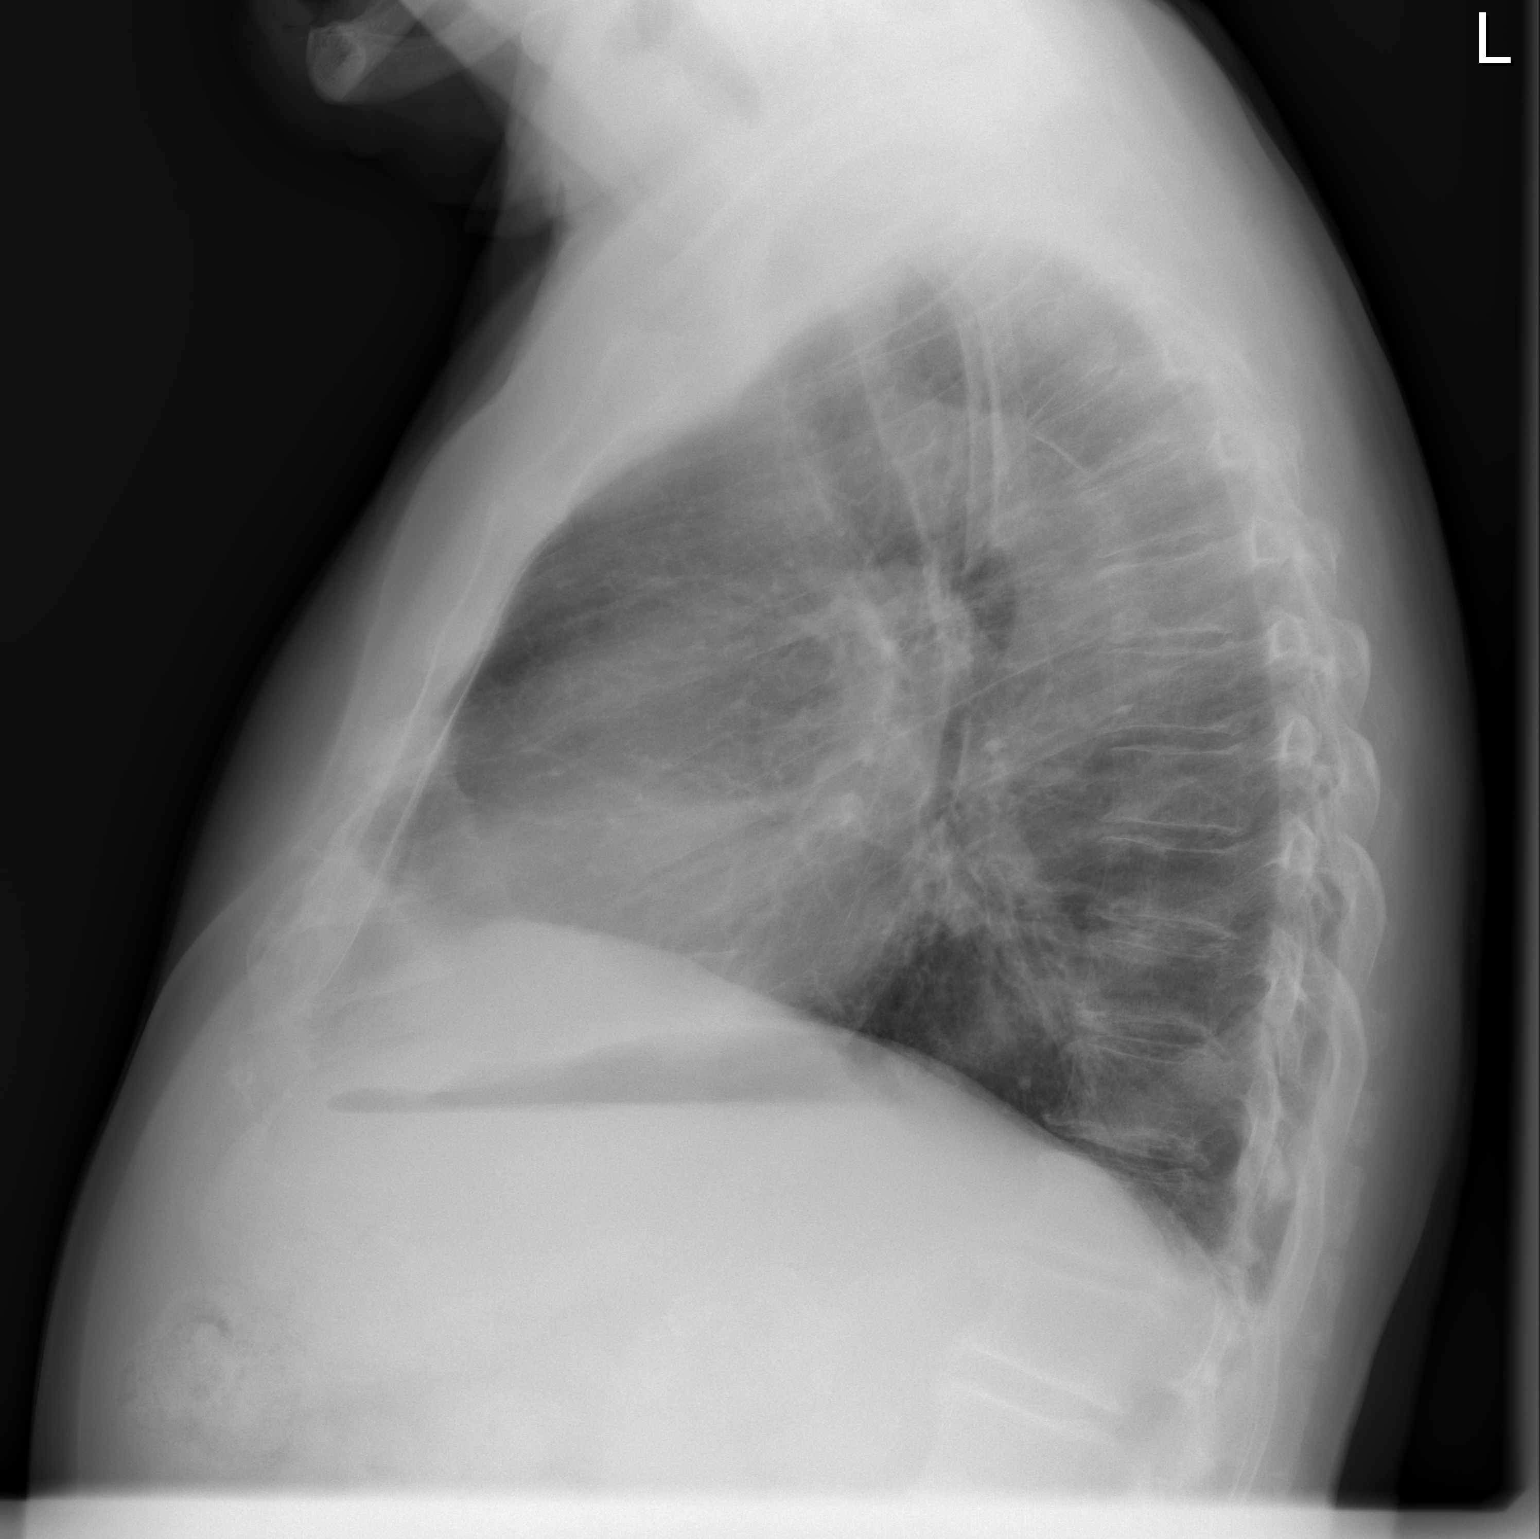

[2 of 2 positions shown; findings below may reference images not displayed]

CLINICAL DATA
Preop hernia repair

EXAM
CHEST  2 VIEW

COMPARISON
07/06/2010

FINDINGS
Heart size is normal. Negative for heart failure. Mild scarring in
the lungs bilaterally. Negative for pneumonia or effusion.

IMPRESSION
No active cardiopulmonary disease.

SIGNATURE

## 2014-09-15 ENCOUNTER — Encounter: Payer: Self-pay | Admitting: Cardiovascular Disease

## 2014-09-15 ENCOUNTER — Ambulatory Visit (INDEPENDENT_AMBULATORY_CARE_PROVIDER_SITE_OTHER): Payer: Medicare HMO | Admitting: Cardiovascular Disease

## 2014-09-15 ENCOUNTER — Other Ambulatory Visit (INDEPENDENT_AMBULATORY_CARE_PROVIDER_SITE_OTHER): Payer: Medicare HMO | Admitting: *Deleted

## 2014-09-15 VITALS — BP 110/62 | HR 57 | Ht 74.0 in | Wt 205.0 lb

## 2014-09-15 DIAGNOSIS — I5022 Chronic systolic (congestive) heart failure: Secondary | ICD-10-CM

## 2014-09-15 DIAGNOSIS — E785 Hyperlipidemia, unspecified: Secondary | ICD-10-CM

## 2014-09-15 LAB — LIPID PANEL
CHOL/HDL RATIO: 3
CHOLESTEROL: 105 mg/dL (ref 0–200)
HDL: 30.4 mg/dL — ABNORMAL LOW (ref >39.00)
LDL CALC: 49 mg/dL (ref 0–99)
NonHDL: 74.6
TRIGLYCERIDES: 128 mg/dL (ref 0.0–149.0)
VLDL: 25.6 mg/dL (ref 0.0–40.0)

## 2014-09-15 LAB — BASIC METABOLIC PANEL
BUN: 21 mg/dL (ref 6–23)
CO2: 29 mEq/L (ref 19–32)
CREATININE: 1.12 mg/dL (ref 0.40–1.50)
Calcium: 9.5 mg/dL (ref 8.4–10.5)
Chloride: 102 mEq/L (ref 96–112)
GFR: 67.49 mL/min (ref >60.00)
Glucose, Bld: 105 mg/dL — ABNORMAL HIGH (ref 70–99)
POTASSIUM: 4.4 meq/L (ref 3.5–5.1)
Sodium: 135 mEq/L (ref 135–145)

## 2014-09-15 LAB — HEPATIC FUNCTION PANEL
ALT: 11 U/L (ref 0–53)
AST: 14 U/L (ref 0–37)
Albumin: 4.2 g/dL (ref 3.5–5.2)
Alkaline Phosphatase: 62 U/L (ref 39–117)
Bilirubin, Direct: 0.2 mg/dL (ref 0.0–0.3)
Total Bilirubin: 1.3 mg/dL — ABNORMAL HIGH (ref 0.2–1.2)
Total Protein: 6.6 g/dL (ref 6.0–8.3)

## 2014-09-15 MED ORDER — CARVEDILOL 12.5 MG PO TABS: 12.5000 mg | ORAL_TABLET | Freq: Two times a day (BID) | ORAL | Status: DC

## 2014-09-15 NOTE — Progress Notes (Signed)
Cardiology Office Note   Date:  09/15/2014   ID:  Jeffrey Stewart, DOB 1935-01-29, MRN [id number]  PCP:  Jeffrey Shire, MD  Cardiologist: Thayer Headings, MD   No chief complaint on file.  1. Congestive heart failure-chronic systolic congestive heart failure Left ventricle: The cavity size was moderately to severely dilated. Wall thickness was normal. Systolic function was normal. The estimated ejection fraction was in the range of 50% to 55%. Wall motion was normal; there were no regional wall motion abnormalities. There was an increased relative contribution of atrial contraction to ventricular filling. - Aortic valve: Mild regurgitation. - Left atrium: The atrium was mildly dilated. - Right ventricle: The cavity size was moderately dilated. Systolic function was mildly reduced. - Right atrium: The atrium was mildly dilated 2. RBBB 3. Mild coronary artery disease- The left anterior descending artery is moderately calcified. There is a mid 40%-50% stenosis right at the takeoff of the first diagonal artery. D1 - moderate stenosis.  The left circumflex artery is large and is dominant.  80-90% PDA stenosis  The ramus intermediate vessel has minor luminal irregularities. The right coronary artery is small, it is nondominant.  LV gram -  EF  35-40%. There is hypokinesis of the anterior wall, apex, and inferoapical wall. The aortic root appears to be moderately dilated.  4. Hyperlipidemia  History of Present Illness:  80 yo gentleman with a hx of mild CHF. He walks occasionally. He has been exercising on a regular basis. He works out in his garden and walks on his treadmill in the evenings. He's not had any significant episodes of chest pain or shortness breath.   Feb. 6, 2014  Jeffrey Stewart is doing well since I last saw him. He's not had any episodes of chest pain or shortness breath. He's exercising on a regular basis.  March 10, 2013:  Jeffrey Stewart  is doing well. He's not had any episodes of chest pain or shortness breath. Playing golf, fishing, gardening, boating this past summer. Tries to avoid salt. No dypsnea.   Feb. 24, 2015:  Pt is doing OK. No CP. He has some abdominal issues - has 2 hernias. Will be seen the surgeons today.  Sept. 3, 2015:  Jeffrey Stewart is doing well. Had his hernias repaired No CP, no dyspnea. Staying active. Walks regularly.    September 15, 2014:   Jeffrey Stewart is a 80 y.o. male who presents for follow up of his CHF Occasional DOE, no CP.      Past Medical History  Diagnosis Date  . Coronary artery disease   . CHF (congestive heart failure) 11/2009    EF45-50%  . Hyperlipidemia   . Allergy   . Asthma   . Cataract   . Myocardial infarction   . Shortness of breath     SOMETIMES SOB OR WHEEZING WITH EXERTION  . Pneumonia     HX OF PNEUMONIA 4 OR 5 YRS AGO  . Arthritis     MINOR ARTHRITIS FEET AND TOES  . Rosacea     OF FACE    Past Surgical History  Procedure Laterality Date  . Last echo 02/2009    . Last nuc 2009  12/16/2007    EF 33%. ABNORMAL. 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 Electrocardiogram  11/2009    RBBB  . Cardiac catheterization  11/2009    mild / mod cad,MODERATE TIGHT STENOSIS IN THE DISTAL LEFT CIRCUMFLEX ARTERY  .  US echocardiography  02/23/2009    EF 45-50%  . US echocardiography  12/27/2007    EF 45-50%  . Eye surgery      BILATERAL CATARACT EXTRACT EXTRACTION WITH IMPLANTS  . Appendectomy      AGE 89  . Inguinal hernia repair Bilateral 09/29/2013    Procedure: LAPAROSCOPIC BILATERAL  INGUINAL HERNIA;  Surgeon: [name], MD;  Location: [facility];  Service: General;  Laterality: Bilateral;  . Insertion of mesh Bilateral 09/29/2013    Procedure: INSERTION OF MESH;  Surgeon: [name], MD;  Location: [facility];  Service: General;  Laterality: Bilateral;     Current Outpatient Prescriptions  Medication Sig  Dispense Refill  . acetaminophen (TYLENOL) 500 MG tablet Take 1,000 mg by mouth every 6 (six) hours as needed for moderate pain.    Marland Kitchen albuterol (PROVENTIL HFA;VENTOLIN HFA) 108 (90 BASE) MCG/ACT inhaler Inhale 1 puff into the lungs every 6 (six) hours as needed for wheezing.     Marland Kitchen aspirin EC 81 MG tablet Take 81 mg by mouth every evening.    Marland Kitchen atorvastatin (LIPITOR) 40 MG tablet TAKE 1 TABLET EVERY DAY 90 tablet 3  . carvedilol (COREG) 12.5 MG tablet TAKE 1 TABLET TWICE DAILY WITH MEALS 180 tablet 0  . cetirizine (ZYRTEC) 10 MG tablet Take 10 mg by mouth at bedtime.     . cholecalciferol (VITAMIN D) 1000 UNITS tablet Take 1,000 Units by mouth daily.    . fish oil-omega-3 fatty acids 1000 MG capsule Take 1 g by mouth 3 (three) times daily.     Marland Kitchen GLUCOSAMINE PO Take 1 tablet by mouth 2 (two) times daily.     Marland Kitchen HYDROcodone-acetaminophen (NORCO/VICODIN) 5-325 MG per tablet Take 1-2 tablets by mouth every 4 (four) hours as needed. 30 tablet 0  . lisinopril (PRINIVIL,ZESTRIL) 5 MG tablet TAKE 1 TABLET EVERY DAY 90 tablet 3  . metroNIDAZOLE (METROCREAM) 0.75 % cream Apply 1 application topically 2 (two) times daily.     . minocycline (DYNACIN) 50 MG tablet Take 50 mg by mouth 2 (two) times daily.    . mometasone (ASMANEX) 220 MCG/INH inhaler Inhale 1 puff into the lungs at bedtime.     Marland Kitchen Neomycin-Bacitracin-Polymyxin (HCA TRIPLE ANTIBIOTIC OINTMENT EX) as needed.     Marland Kitchen Neomycin-Polymyxin-HC (CORTISPORIN OT) Place 1 drop in ear(s) 2 (two) times daily as needed. Ear infection    . niacin (NIASPAN) 500 MG CR tablet Take 1 tablet (500 mg total) by mouth at bedtime. 90 tablet 3  . nitroGLYCERIN (NITROSTAT) 0.4 MG SL tablet Place 0.4 mg under the tongue every 5 (five) minutes as needed for chest pain.    Vladimir Faster Glycol-Propyl Glycol (SYSTANE) 0.4-0.3 % SOLN Place 1 drop into both eyes 2 (two) times daily as needed (dry eyes).    . sodium chloride (OCEAN) 0.65 % nasal spray      No current  facility-administered medications for this visit.    Allergies:   Niaspan    Social History:  The patient  reports that he has never smoked. He has never used smokeless tobacco. He reports that he does not drink alcohol or use illicit drugs.   Family History:  The patient's family history is not on file.    ROS:  Please see the history of present illness.    Review of Systems: Constitutional:  denies fever, chills, diaphoresis, appetite change and fatigue.  HEENT: denies photophobia, eye pain, redness, hearing loss, ear pain, congestion, sore  throat, rhinorrhea, sneezing, neck pain, neck stiffness and tinnitus.  Respiratory: admits to SOB, DOE, cough,    Cardiovascular: denies chest pain, palpitations and leg swelling.  Gastrointestinal: denies nausea, vomiting, abdominal pain, diarrhea, constipation, blood in stool.  Genitourinary: denies dysuria, urgency, frequency, hematuria, flank pain and difficulty urinating.  Musculoskeletal: denies  myalgias, back pain, joint swelling, arthralgias and gait problem.   Skin: denies pallor, rash and wound.  Neurological: denies dizziness, seizures, syncope, weakness, light-headedness, numbness and headaches.   Hematological: denies adenopathy, easy bruising, personal or family bleeding history.  Psychiatric/ Behavioral: admits to suicidal ideation, mood changes, confusion, nervousness, sleep disturbance and agitation.       All other systems are reviewed and negative.    PHYSICAL EXAM: VS:  BP 110/62 mmHg  Pulse 57  Ht 6\' 2"  (1.88 m)  Wt 205 lb (92.987 kg)  BMI 26.31 kg/m2 , BMI Body mass index is 26.31 kg/(m^2). GEN: Well nourished, well developed, in no acute distress HEENT: normal Neck: no JVD, carotid bruits, or masses Cardiac: RRR; no murmurs, rubs, or gallops,no edema  Respiratory:  clear to auscultation bilaterally, normal work of breathing GI: soft, nontender, nondistended, + BS MS: no deformity or atrophy Skin: warm and  dry, no rash Neuro:  Strength and sensation are intact Psych: normal   EKG:  EKG is ordered today. The ekg ordered today demonstrates sinus bradycardia at a 57. He has left axis deviation. Right bundle branch spot.     Recent Labs: 09/24/2013: Hemoglobin 15.6; Platelets 123* 03/19/2014: ALT 15; BUN 12; Creatinine 0.9; Potassium 4.6; Sodium 129*    Lipid Panel    Component Value Date/Time   CHOL 91 03/19/2014 0933   TRIG 104.0 03/19/2014 0933   HDL 28.90* 03/19/2014 0933   CHOLHDL 3 03/19/2014 0933   VLDL 20.8 03/19/2014 0933   LDLCALC 41 03/19/2014 0933      Wt Readings from Last 3 Encounters:  09/15/14 205 lb (92.987 kg)  03/19/14 200 lb 6.4 oz (90.901 kg)  10/22/13 205 lb 9.6 oz (93.26 kg)      Other studies Reviewed: Additional studies/ records that were reviewed today include: . Review of the above records demonstrates:    ASSESSMENT AND PLAN:  1. Congestive heart failure-chronic systolic congestive heart failure His left ventricular systolic function has normalized. He has an EF of 55%. Continue current dose of carvedilol and lisinopril.  2. RBBB- stable  3. Mild coronary artery disease-tight stenosis in the distal left circumflex artery- he's not having any episodes of angina.  4. Hyperlipidemia - will check lipids today.    Current medicines are reviewed at length with the patient today.  The patient does not have concerns regarding medicines.  The following changes have been made:  no change   Disposition:   FU with me in 6 months.     Signed, Sierra Bissonette, Wonda Cheng, MD  09/15/2014 10:02 AM    [address], [place], [place]  [address] Phone: [phone]; Fax: [phone]

## 2014-09-15 NOTE — Patient Instructions (Signed)
Your physician recommends that you continue on your current medications as directed. Please refer to the Current Medication list given to you today.  Your physician recommends that you have lab work:  TODAY - fasting cholesterol, liver, basic metabolic panel  Your physician wants you to follow-up in: 6 months with Dr. Acie Fredrickson.  You will receive a reminder letter in the mail two months in advance. If you don't receive a letter, please call our office to schedule the follow-up appointment.

## 2015-02-18 ENCOUNTER — Other Ambulatory Visit: Payer: Self-pay | Admitting: Cardiovascular Disease

## 2015-03-06 ENCOUNTER — Other Ambulatory Visit: Payer: Self-pay | Admitting: Cardiovascular Disease

## 2015-03-16 ENCOUNTER — Ambulatory Visit (INDEPENDENT_AMBULATORY_CARE_PROVIDER_SITE_OTHER): Payer: Medicare HMO | Admitting: Cardiovascular Disease

## 2015-03-16 ENCOUNTER — Encounter: Payer: Self-pay | Admitting: Cardiovascular Disease

## 2015-03-16 VITALS — BP 110/60 | HR 61 | Ht 74.0 in | Wt 203.1 lb

## 2015-03-16 DIAGNOSIS — I5022 Chronic systolic (congestive) heart failure: Secondary | ICD-10-CM

## 2015-03-16 DIAGNOSIS — E785 Hyperlipidemia, unspecified: Secondary | ICD-10-CM

## 2015-03-16 LAB — BASIC METABOLIC PANEL
BUN: 14 mg/dL (ref 6–23)
CHLORIDE: 98 meq/L (ref 96–112)
CO2: 29 mEq/L (ref 19–32)
CREATININE: 1.01 mg/dL (ref 0.40–1.50)
Calcium: 9.3 mg/dL (ref 8.4–10.5)
GFR: 75.94 mL/min (ref >60.00)
Glucose, Bld: 96 mg/dL (ref 70–99)
Potassium: 4.2 mEq/L (ref 3.5–5.1)
Sodium: 132 mEq/L — ABNORMAL LOW (ref 135–145)

## 2015-03-16 LAB — HEPATIC FUNCTION PANEL
ALT: 13 U/L (ref 0–53)
AST: 15 U/L (ref 0–37)
Albumin: 4.1 g/dL (ref 3.5–5.2)
Alkaline Phosphatase: 63 U/L (ref 39–117)
BILIRUBIN TOTAL: 1 mg/dL (ref 0.2–1.2)
Bilirubin, Direct: 0.2 mg/dL (ref 0.0–0.3)
TOTAL PROTEIN: 6.2 g/dL (ref 6.0–8.3)

## 2015-03-16 LAB — LIPID PANEL
CHOL/HDL RATIO: 3
Cholesterol: 95 mg/dL (ref 0–200)
HDL: 28.6 mg/dL — AB (ref >39.00)
LDL Cholesterol: 43 mg/dL (ref 0–99)
NonHDL: 66.18
TRIGLYCERIDES: 114 mg/dL (ref 0.0–149.0)
VLDL: 22.8 mg/dL (ref 0.0–40.0)

## 2015-03-16 NOTE — Patient Instructions (Signed)

## 2015-03-16 NOTE — Progress Notes (Signed)
Cardiology Office Note   Date:  03/16/2015   ID:  Jeffrey Stewart, DOB [birth year], MRN [id number]  PCP:  Stephens Shire, MD  Cardiologist: Thayer Headings, MD   Chief Complaint  Patient presents with  . Chronic systolic CHF   1. Congestive heart failure-chronic systolic congestive heart failure Left ventricle: The cavity size was moderately to severely dilated. Wall thickness was normal. Systolic function was normal. The estimated ejection fraction was in the range of 50% to 55%. Wall motion was normal; there were no regional wall motion abnormalities. There was an increased relative contribution of atrial contraction to ventricular filling. - Aortic valve: Mild regurgitation. - Left atrium: The atrium was mildly dilated. - Right ventricle: The cavity size was moderately dilated. Systolic function was mildly reduced. - Right atrium: The atrium was mildly dilated 2. RBBB 3. Mild coronary artery disease- The left anterior descending artery is moderately calcified. There is a mid 40%-50% stenosis right at the takeoff of the first diagonal artery. D1 - moderate stenosis.  The left circumflex artery is large and is dominant.  80-90% PDA stenosis  The ramus intermediate vessel has minor luminal irregularities. The right coronary artery is small, it is nondominant.  LV gram -  EF  35-40%. There is hypokinesis of the anterior wall, apex, and inferoapical wall. The aortic root appears to be moderately dilated.  4. Hyperlipidemia  History of Present Illness:  80 yo gentleman with a hx of mild CHF. He walks occasionally. He has been exercising on a regular basis. He works out in his garden and walks on his treadmill in the evenings. He's not had any significant episodes of chest pain or shortness breath.   Feb. 6, 2014  Jeffrey Stewart is doing well since I last saw him. He's not had any episodes of chest pain or shortness breath. He's exercising on a  regular basis.  March 10, 2013:  Jeffrey Stewart is doing well. He's not had any episodes of chest pain or shortness breath. Playing golf, fishing, gardening, boating this past summer. Tries to avoid salt. No dypsnea.   Feb. 24, 2015:  Pt is doing OK. No CP. He has some abdominal issues - has 2 hernias. Will be seen the surgeons today.  Sept. 3, 2015:  Jeffrey Stewart is doing well. Had his hernias repaired No CP, no dyspnea. Staying active. Walks regularly.    September 15, 2014:   Jeffrey Stewart is a 80 y.o. male who presents for follow up of his CHF Occasional DOE, no CP.    Aug. 30, 2016:  Doing well.  Has some allergies . No CP or dyspnea.  Works out in the garden     Past Medical History  Diagnosis Date  . Coronary artery disease   . CHF (congestive heart failure) 11/2009    EF45-50%  . Hyperlipidemia   . Allergy   . Asthma   . Cataract   . Myocardial infarction   . Shortness of breath     SOMETIMES SOB OR WHEEZING WITH EXERTION  . Pneumonia     HX OF PNEUMONIA 4 OR 5 YRS AGO  . Arthritis     MINOR ARTHRITIS FEET AND TOES  . Rosacea     OF FACE    Past Surgical History  Procedure Laterality Date  . Last echo 02/2009    . Last nuc 2009  12/16/2007    EF 33%. ABNORMAL. 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  Electrocardiogram  11/2009    RBBB  . Cardiac catheterization  11/2009    mild / mod cad,MODERATE TIGHT STENOSIS IN THE DISTAL LEFT CIRCUMFLEX ARTERY  . US echocardiography  02/23/2009    EF 45-50%  . US echocardiography  12/27/2007    EF 45-50%  . Eye surgery      BILATERAL CATARACT EXTRACT EXTRACTION WITH IMPLANTS  . Appendectomy      AGE 7  . Inguinal hernia repair Bilateral 09/29/2013    Procedure: LAPAROSCOPIC BILATERAL  INGUINAL HERNIA;  Surgeon: [name], MD;  Location: [facility];  Service: General;  Laterality: Bilateral;  . Insertion of mesh Bilateral 09/29/2013    Procedure: INSERTION OF MESH;  Surgeon:  [name], MD;  Location: [facility];  Service: General;  Laterality: Bilateral;     Current Outpatient Prescriptions  Medication Sig Dispense Refill  . acetaminophen (TYLENOL) 500 MG tablet Take 1,000 mg by mouth every 6 (six) hours as needed for moderate pain.    Marland Kitchen albuterol (PROVENTIL HFA;VENTOLIN HFA) 108 (90 BASE) MCG/ACT inhaler Inhale 1 puff into the lungs every 6 (six) hours as needed for wheezing.     Marland Kitchen aspirin EC 81 MG tablet Take 81 mg by mouth every evening.    Marland Kitchen atorvastatin (LIPITOR) 40 MG tablet TAKE 1 TABLET EVERY DAY 90 tablet 3  . carvedilol (COREG) 12.5 MG tablet Take 1 tablet (12.5 mg total) by mouth 2 (two) times daily with a meal. 180 tablet 3  . cetirizine (ZYRTEC) 10 MG tablet Take 10 mg by mouth at bedtime.     . cholecalciferol (VITAMIN D) 1000 UNITS tablet Take 1,000 Units by mouth daily.    . fish oil-omega-3 fatty acids 1000 MG capsule Take 1 g by mouth 3 (three) times daily.     . fluticasone (FLONASE) 50 MCG/ACT nasal spray Place 1-2 sprays into both nostrils daily.    Marland Kitchen GLUCOSAMINE PO Take 1 tablet by mouth 2 (two) times daily.     Marland Kitchen HYDROcodone-acetaminophen (NORCO/VICODIN) 5-325 MG per tablet Take 1-2 tablets by mouth every 4 (four) hours as needed. 30 tablet 0  . lisinopril (PRINIVIL,ZESTRIL) 5 MG tablet TAKE 1 TABLET EVERY DAY 90 tablet 3  . metroNIDAZOLE (METROCREAM) 0.75 % cream Apply 1 application topically 2 (two) times daily.     . minocycline (DYNACIN) 50 MG tablet Take 50 mg by mouth 2 (two) times daily.    . mometasone (ASMANEX) 220 MCG/INH inhaler Inhale 1 puff into the lungs at bedtime.     Marland Kitchen neomycin-bacitracin-polymyxin (NEOSPORIN) ointment Apply 1 application topically as needed for wound care. apply to eye    . Neomycin-Polymyxin-HC (CORTISPORIN OT) Place 1 drop in ear(s) 2 (two) times daily as needed. Ear infection    . niacin (NIASPAN) 500 MG CR tablet Take 1 tablet (500 mg total) by mouth at bedtime. 90 tablet 3  . nitroGLYCERIN  (NITROSTAT) 0.4 MG SL tablet Place 0.4 mg under the tongue every 5 (five) minutes as needed for chest pain.    Vladimir Faster Glycol-Propyl Glycol (SYSTANE) 0.4-0.3 % SOLN Place 1 drop into both eyes 2 (two) times daily as needed (dry eyes).    . sodium chloride (OCEAN) 0.65 % nasal spray Place 1 spray into the nose as needed for congestion.     No current facility-administered medications for this visit.    Allergies:   Niaspan    Social History:  The patient  reports that he has never smoked. He has  never used smokeless tobacco. He reports that he does not drink alcohol or use illicit drugs.   Family History:  The patient's family history includes Diabetes in his sister.    ROS:  Please see the history of present illness.    Review of Systems: Constitutional:  denies fever, chills, diaphoresis, appetite change and fatigue.  HEENT: denies photophobia, eye pain, redness, hearing loss, ear pain, congestion, sore throat, rhinorrhea, sneezing, neck pain, neck stiffness and tinnitus.  Respiratory: admits to SOB, DOE, cough,    Cardiovascular: denies chest pain, palpitations and leg swelling.  Gastrointestinal: denies nausea, vomiting, abdominal pain, diarrhea, constipation, blood in stool.  Genitourinary: denies dysuria, urgency, frequency, hematuria, flank pain and difficulty urinating.  Musculoskeletal: denies  myalgias, back pain, joint swelling, arthralgias and gait problem.   Skin: denies pallor, rash and wound.  Neurological: denies dizziness, seizures, syncope, weakness, light-headedness, numbness and headaches.   Hematological: denies adenopathy, easy bruising, personal or family bleeding history.  Psychiatric/ Behavioral: admits to suicidal ideation, mood changes, confusion, nervousness, sleep disturbance and agitation.       All other systems are reviewed and negative.    PHYSICAL EXAM: VS:  BP 110/60 mmHg  Pulse 61  Ht 6\' 2"  (1.88 m)  Wt 92.135 kg (203 lb 1.9 oz)  BMI  26.07 kg/m2  SpO2 97% , BMI Body mass index is 26.07 kg/(m^2). GEN: Well nourished, well developed, in no acute distress HEENT: normal Neck: no JVD, carotid bruits, or masses Cardiac: RRR; no murmurs, rubs, or gallops,no edema  Respiratory:  clear to auscultation bilaterally, normal work of breathing GI: soft, nontender, nondistended, + BS MS: no deformity or atrophy Skin: warm and dry, no rash Neuro:  Strength and sensation are intact Psych: normal   EKG:  EKG is ordered today. The ekg ordered today demonstrates sinus bradycardia at a 57. He has left axis deviation. Right bundle branch spot.     Recent Labs: 09/15/2014: ALT 11; BUN 21; Creatinine, Ser 1.12; Potassium 4.4; Sodium 135    Lipid Panel    Component Value Date/Time   CHOL 105 09/15/2014 0927   TRIG 128.0 09/15/2014 0927   HDL 30.40* 09/15/2014 0927   CHOLHDL 3 09/15/2014 0927   VLDL 25.6 09/15/2014 0927   LDLCALC 49 09/15/2014 0927      Wt Readings from Last 3 Encounters:  03/16/15 92.135 kg (203 lb 1.9 oz)  09/15/14 92.987 kg (205 lb)  03/19/14 90.901 kg (200 lb 6.4 oz)      Other studies Reviewed: Additional studies/ records that were reviewed today include: . Review of the above records demonstrates:    ASSESSMENT AND PLAN:  1. Congestive heart failure-chronic systolic congestive heart failure His left ventricular systolic function has normalized. He has an EF of 55%. Continue current dose of carvedilol and lisinopril.  2. RBBB- stable  3. Mild coronary artery disease-tight stenosis in the distal left circumflex artery- he's not having any episodes of angina.  4. Hyperlipidemia - will check lipids today.  Will see him in 6 months for OV and repeat labs.     Current medicines are reviewed at length with the patient today.  The patient does not have concerns regarding medicines.  The following changes have been made:  no change   Disposition:   FU with me in 6 months.     Signed, Nahser,  Wonda Cheng, MD  03/16/2015 9:27 AM    [address] [place], [place]  [address] Phone: [phone]; Fax: [phone]

## 2015-06-30 ENCOUNTER — Other Ambulatory Visit: Payer: Self-pay | Admitting: Cardiovascular Disease

## 2015-09-14 ENCOUNTER — Ambulatory Visit (INDEPENDENT_AMBULATORY_CARE_PROVIDER_SITE_OTHER): Payer: Medicare HMO | Admitting: Cardiovascular Disease

## 2015-09-14 ENCOUNTER — Other Ambulatory Visit (INDEPENDENT_AMBULATORY_CARE_PROVIDER_SITE_OTHER): Payer: Medicare HMO

## 2015-09-14 ENCOUNTER — Encounter: Payer: Self-pay | Admitting: Cardiovascular Disease

## 2015-09-14 VITALS — BP 100/70 | HR 57 | Ht 74.0 in | Wt 207.0 lb

## 2015-09-14 DIAGNOSIS — E785 Hyperlipidemia, unspecified: Secondary | ICD-10-CM

## 2015-09-14 DIAGNOSIS — I25810 Atherosclerosis of coronary artery bypass graft(s) without angina pectoris: Secondary | ICD-10-CM

## 2015-09-14 DIAGNOSIS — I5022 Chronic systolic (congestive) heart failure: Secondary | ICD-10-CM

## 2015-09-14 DIAGNOSIS — I509 Heart failure, unspecified: Secondary | ICD-10-CM | POA: Diagnosis not present

## 2015-09-14 LAB — BASIC METABOLIC PANEL
BUN: 17 mg/dL (ref 7–25)
CHLORIDE: 101 mmol/L (ref 98–110)
CO2: 28 mmol/L (ref 20–31)
CREATININE: 1.19 mg/dL — AB (ref 0.70–1.18)
Calcium: 9.3 mg/dL (ref 8.6–10.3)
Glucose, Bld: 93 mg/dL (ref 65–99)
Potassium: 5.2 mmol/L (ref 3.5–5.3)
Sodium: 136 mmol/L (ref 135–146)

## 2015-09-14 LAB — HEPATIC FUNCTION PANEL
ALT: 12 U/L (ref 9–46)
AST: 15 U/L (ref 10–35)
Albumin: 4.1 g/dL (ref 3.6–5.1)
Alkaline Phosphatase: 67 U/L (ref 40–115)
BILIRUBIN DIRECT: 0.2 mg/dL (ref <=0.2)
Indirect Bilirubin: 0.8 mg/dL (ref 0.2–1.2)
TOTAL PROTEIN: 6.1 g/dL (ref 6.1–8.1)
Total Bilirubin: 1 mg/dL (ref 0.2–1.2)

## 2015-09-14 LAB — LIPID PANEL
CHOLESTEROL: 111 mg/dL — AB (ref 125–200)
HDL: 35 mg/dL — ABNORMAL LOW (ref >=40)
LDL Cholesterol: 59 mg/dL (ref <130)
TRIGLYCERIDES: 85 mg/dL (ref <150)
Total CHOL/HDL Ratio: 3.2 Ratio (ref <=5.0)
VLDL: 17 mg/dL (ref <30)

## 2015-09-14 MED ORDER — NITROGLYCERIN 0.4 MG SL SUBL: 0.4000 mg | SUBLINGUAL_TABLET | SUBLINGUAL | Status: DC | PRN

## 2015-09-14 MED ORDER — CARVEDILOL 12.5 MG PO TABS: 12.5000 mg | ORAL_TABLET | Freq: Two times a day (BID) | ORAL | Status: DC

## 2015-09-14 NOTE — Progress Notes (Signed)
Cardiology Office Note   Date:  09/14/2015   ID:  Jeffrey Stewart, DOB July 14, 1935, MRN [id number]  PCP:  Stephens Shire, MD  Cardiologist: Thayer Headings, MD   Chief Complaint  Patient presents with  . Congestive Heart Failure    SOME SOB WITH EXERTION. NO CHEST PAIN OR SWELLING  . Coronary Artery Disease   1. Congestive heart failure-chronic systolic congestive heart failure Left ventricle: The cavity size was moderately to severely dilated. Wall thickness was normal. Systolic function was normal. The estimated ejection fraction was in the range of 50% to 55%. Wall motion was normal; there were no regional wall motion abnormalities. There was an increased relative contribution of atrial contraction to ventricular filling. - Aortic valve: Mild regurgitation. - Left atrium: The atrium was mildly dilated. - Right ventricle: The cavity size was moderately dilated. Systolic function was mildly reduced. - Right atrium: The atrium was mildly dilated 2. RBBB 3. Mild coronary artery disease- The left anterior descending artery is moderately calcified. There is a mid 40%-50% stenosis right at the takeoff of the first diagonal artery. D1 - moderate stenosis.  The left circumflex artery is large and is dominant.  80-90% PDA stenosis  The ramus intermediate vessel has minor luminal irregularities. The right coronary artery is small, it is nondominant.  LV gram -  EF  35-40%. There is hypokinesis of the anterior wall, apex, and inferoapical wall. The aortic root appears to be moderately dilated.  4. Hyperlipidemia  History of Present Illness:  81 yo gentleman with a hx of mild CHF. He walks occasionally. He has been exercising on a regular basis. He works out in his garden and walks on his treadmill in the evenings. He's not had any significant episodes of chest pain or shortness breath.   Feb. 6, 2014  Jeffrey Stewart is doing well since I last saw him.  He's not had any episodes of chest pain or shortness breath. He's exercising on a regular basis.  March 10, 2013:  Jeffrey Stewart is doing well. He's not had any episodes of chest pain or shortness breath. Playing golf, fishing, gardening, boating this past summer. Tries to avoid salt. No dypsnea.   Feb. 24, 2015:  Pt is doing OK. No CP. He has some abdominal issues - has 2 hernias. Will be seen the surgeons today.  Sept. 3, 2015:  Jeffrey Stewart is doing well. Had his hernias repaired No CP, no dyspnea. Staying active. Walks regularly.    September 15, 2014:   Jeffrey Stewart is a 81 y.o. male who presents for follow up of his CHF Occasional DOE, no CP.    Aug. 30, 2016:  Doing well.  Has some allergies . No CP or dyspnea.  Works out in the garden   Feb. 28, 2017:  Doing well.  Working in his garden   Past Medical History  Diagnosis Date  . Coronary artery disease   . CHF (congestive heart failure) ([place]) 11/2009    EF45-50%  . Hyperlipidemia   . Allergy   . Asthma   . Cataract   . Myocardial infarction ([place])   . Shortness of breath     SOMETIMES SOB OR WHEEZING WITH EXERTION  . Pneumonia     HX OF PNEUMONIA 4 OR 5 YRS AGO  . Arthritis     MINOR ARTHRITIS FEET AND TOES  . Rosacea     OF FACE    Past Surgical History  Procedure Laterality Date  . Last  echo 02/2009    . Last nuc 2009  12/16/2007    EF 33%. ABNORMAL. 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 Electrocardiogram  11/2009    RBBB  . Cardiac catheterization  11/2009    mild / mod cad,MODERATE TIGHT STENOSIS IN THE DISTAL LEFT CIRCUMFLEX ARTERY  . US echocardiography  02/23/2009    EF 45-50%  . US echocardiography  12/27/2007    EF 45-50%  . Eye surgery      BILATERAL CATARACT EXTRACT EXTRACTION WITH IMPLANTS  . Appendectomy      AGE 78  . Inguinal hernia repair Bilateral 09/29/2013    Procedure: LAPAROSCOPIC BILATERAL  INGUINAL HERNIA;  Surgeon: [name], MD;   Location: [facility];  Service: General;  Laterality: Bilateral;  . Insertion of mesh Bilateral 09/29/2013    Procedure: INSERTION OF MESH;  Surgeon: [name], MD;  Location: [facility];  Service: General;  Laterality: Bilateral;     Current Outpatient Prescriptions  Medication Sig Dispense Refill  . acetaminophen (TYLENOL) 500 MG tablet Take 1,000 mg by mouth every 6 (six) hours as needed for moderate pain.    Marland Kitchen albuterol (PROVENTIL HFA;VENTOLIN HFA) 108 (90 BASE) MCG/ACT inhaler Inhale 1 puff into the lungs every 6 (six) hours as needed for wheezing.     Marland Kitchen aspirin EC 81 MG tablet Take 81 mg by mouth every evening.    Marland Kitchen atorvastatin (LIPITOR) 40 MG tablet TAKE 1 TABLET EVERY DAY 90 tablet 3  . carvedilol (COREG) 12.5 MG tablet TAKE 1 TABLET TWICE DAILY WITH MEALS 180 tablet 3  . cetirizine (ZYRTEC) 10 MG tablet Take 10 mg by mouth at bedtime.     . cholecalciferol (VITAMIN D) 1000 UNITS tablet Take 1,000 Units by mouth daily.    . fish oil-omega-3 fatty acids 1000 MG capsule Take 1 g by mouth 2 (two) times daily.     . fluticasone (FLONASE) 50 MCG/ACT nasal spray Place 1-2 sprays into both nostrils daily.    Marland Kitchen GLUCOSAMINE PO Take 1 tablet by mouth 2 (two) times daily.     Marland Kitchen lisinopril (PRINIVIL,ZESTRIL) 5 MG tablet TAKE 1 TABLET EVERY DAY 90 tablet 3  . metroNIDAZOLE (METROCREAM) 0.75 % cream Apply 1 application topically 2 (two) times daily.     . minocycline (DYNACIN) 50 MG tablet Take 50 mg by mouth 2 (two) times daily.    . mometasone (ASMANEX) 220 MCG/INH inhaler Inhale 1 puff into the lungs at bedtime.     . niacin (NIASPAN) 500 MG CR tablet Take 1 tablet (500 mg total) by mouth at bedtime. 90 tablet 3  . nitroGLYCERIN (NITROSTAT) 0.4 MG SL tablet Place 0.4 mg under the tongue every 5 (five) minutes as needed for chest pain.    Vladimir Faster Glycol-Propyl Glycol (SYSTANE) 0.4-0.3 % SOLN Place 1 drop into both eyes 2 (two) times daily as needed (dry eyes).    . sodium chloride (OCEAN)  0.65 % nasal spray Place 1 spray into the nose as needed for congestion.     No current facility-administered medications for this visit.    Allergies:   Niaspan    Social History:  The patient  reports that he has never smoked. He has never used smokeless tobacco. He reports that he does not drink alcohol or use illicit drugs.   Family History:  The patient's family history includes Diabetes in his sister.    ROS:  Please see the  history of present illness.    Review of Systems: Constitutional:  denies fever, chills, diaphoresis, appetite change and fatigue.  HEENT: denies photophobia, eye pain, redness, hearing loss, ear pain, congestion, sore throat, rhinorrhea, sneezing, neck pain, neck stiffness and tinnitus.  Respiratory: admits to SOB, DOE, cough,    Cardiovascular: denies chest pain, palpitations and leg swelling.  Gastrointestinal: denies nausea, vomiting, abdominal pain, diarrhea, constipation, blood in stool.  Genitourinary: denies dysuria, urgency, frequency, hematuria, flank pain and difficulty urinating.  Musculoskeletal: denies  myalgias, back pain, joint swelling, arthralgias and gait problem.   Skin: denies pallor, rash and wound.  Neurological: denies dizziness, seizures, syncope, weakness, light-headedness, numbness and headaches.   Hematological: denies adenopathy, easy bruising, personal or family bleeding history.  Psychiatric/ Behavioral: admits to suicidal ideation, mood changes, confusion, nervousness, sleep disturbance and agitation.       All other systems are reviewed and negative.    PHYSICAL EXAM: VS:  BP 100/70 mmHg  Pulse 57  Ht 6\' 2"  (1.88 m)  Wt 207 lb (93.895 kg)  BMI 26.57 kg/m2  SpO2 98% , BMI Body mass index is 26.57 kg/(m^2). GEN: Well nourished, well developed, in no acute distress HEENT: normal Neck: no JVD, carotid bruits, or masses Cardiac: RRR; no murmurs, rubs, or gallops,no edema  Respiratory:  clear to auscultation  bilaterally, normal work of breathing GI: soft, nontender, nondistended, + BS MS: no deformity or atrophy Skin: warm and dry, no rash Neuro:  Strength and sensation are intact Psych: normal   EKG:  EKG is ordered today. The ekg ordered today demonstrates sinus bradycardia at a 57. He has left axis deviation. Right bundle branch block      Recent Labs: 03/16/2015: ALT 13; BUN 14; Creatinine, Ser 1.01; Potassium 4.2; Sodium 132*    Lipid Panel    Component Value Date/Time   CHOL 95 03/16/2015 0946   TRIG 114.0 03/16/2015 0946   HDL 28.60* 03/16/2015 0946   CHOLHDL 3 03/16/2015 0946   VLDL 22.8 03/16/2015 0946   LDLCALC 43 03/16/2015 0946      Wt Readings from Last 3 Encounters:  09/14/15 207 lb (93.895 kg)  03/16/15 203 lb 1.9 oz (92.135 kg)  09/15/14 205 lb (92.987 kg)      Other studies Reviewed: Additional studies/ records that were reviewed today include: . Review of the above records demonstrates:    ASSESSMENT AND PLAN:  1. Congestive heart failure-chronic systolic congestive heart failure His left ventricular systolic function has normalized. He has an EF of 55%. Continue current dose of carvedilol and lisinopril.  2. RBBB- stable  3. Mild coronary artery disease-tight stenosis in the distal left circumflex artery- he's not having any episodes of angina.  4. Hyperlipidemia - will check lipids today.  Will see him in 6 months for OV and repeat labs.     Current medicines are reviewed at length with the patient today.  The patient does not have concerns regarding medicines.  The following changes have been made:  no change   Disposition:   FU with me in 6 months.     Signed, Nahser, Wonda Cheng, MD  09/14/2015 11:03 AM    [address], [place], [place]  [address] Phone: [phone]; Fax: [phone]

## 2015-09-14 NOTE — Addendum Note (Signed)
Addended by: Velna Ochs on: 09/14/2015 09:10 AM   Modules accepted: Orders

## 2015-09-14 NOTE — Patient Instructions (Signed)

## 2015-12-23 ENCOUNTER — Other Ambulatory Visit: Payer: Self-pay | Admitting: Cardiovascular Disease

## 2016-03-02 ENCOUNTER — Encounter: Payer: Self-pay | Admitting: Cardiovascular Disease

## 2016-03-28 ENCOUNTER — Ambulatory Visit: Payer: Medicare HMO | Admitting: Cardiovascular Disease

## 2016-03-28 ENCOUNTER — Other Ambulatory Visit: Payer: Medicare HMO

## 2016-04-18 ENCOUNTER — Other Ambulatory Visit: Payer: Self-pay | Admitting: Cardiovascular Disease

## 2016-04-18 ENCOUNTER — Encounter: Payer: Self-pay | Admitting: Cardiovascular Disease

## 2016-05-02 ENCOUNTER — Ambulatory Visit (INDEPENDENT_AMBULATORY_CARE_PROVIDER_SITE_OTHER): Payer: Medicare HMO | Admitting: Cardiovascular Disease

## 2016-05-02 ENCOUNTER — Encounter (INDEPENDENT_AMBULATORY_CARE_PROVIDER_SITE_OTHER): Payer: Self-pay

## 2016-05-02 ENCOUNTER — Encounter: Payer: Self-pay | Admitting: Cardiovascular Disease

## 2016-05-02 VITALS — BP 114/60 | HR 70 | Ht 74.0 in | Wt 203.0 lb

## 2016-05-02 DIAGNOSIS — I5022 Chronic systolic (congestive) heart failure: Secondary | ICD-10-CM | POA: Diagnosis not present

## 2016-05-02 DIAGNOSIS — E782 Mixed hyperlipidemia: Secondary | ICD-10-CM

## 2016-05-02 LAB — BASIC METABOLIC PANEL
BUN: 24 mg/dL (ref 7–25)
CHLORIDE: 99 mmol/L (ref 98–110)
CO2: 25 mmol/L (ref 20–31)
CREATININE: 1.05 mg/dL (ref 0.70–1.18)
Calcium: 9.1 mg/dL (ref 8.6–10.3)
GLUCOSE: 96 mg/dL (ref 65–99)
POTASSIUM: 4.6 mmol/L (ref 3.5–5.3)
Sodium: 131 mmol/L — ABNORMAL LOW (ref 135–146)

## 2016-05-02 LAB — LIPID PANEL
CHOLESTEROL: 85 mg/dL — AB (ref 125–200)
HDL: 23 mg/dL — ABNORMAL LOW (ref >=40)
LDL CALC: 47 mg/dL (ref <130)
TRIGLYCERIDES: 74 mg/dL (ref <150)
Total CHOL/HDL Ratio: 3.7 Ratio (ref <=5.0)
VLDL: 15 mg/dL (ref <30)

## 2016-05-02 LAB — HEPATIC FUNCTION PANEL
ALBUMIN: 3.6 g/dL (ref 3.6–5.1)
ALT: 8 U/L — AB (ref 9–46)
AST: 12 U/L (ref 10–35)
Alkaline Phosphatase: 82 U/L (ref 40–115)
Bilirubin, Direct: 0.2 mg/dL (ref <=0.2)
Indirect Bilirubin: 0.7 mg/dL (ref 0.2–1.2)
TOTAL PROTEIN: 5.7 g/dL — AB (ref 6.1–8.1)
Total Bilirubin: 0.9 mg/dL (ref 0.2–1.2)

## 2016-05-02 NOTE — Progress Notes (Signed)
Cardiology Office Note   Date:  05/02/2016   ID:  Jeffrey Stewart, DOB [birth year], MRN [id number]  PCP:  Stephens Shire, MD  Cardiologist: Mertie Moores, MD   Chief Complaint  Patient presents with  . Coronary Artery Disease   1. Congestive heart failure-chronic systolic congestive heart failure Left ventricle: The cavity size was moderately to severely dilated. Wall thickness was normal. Systolic function was normal. The estimated ejection fraction was in the range of 50% to 55%. Wall motion was normal; there were no regional wall motion abnormalities. There was an increased relative contribution of atrial contraction to ventricular filling. - Aortic valve: Mild regurgitation. - Left atrium: The atrium was mildly dilated. - Right ventricle: The cavity size was moderately dilated. Systolic function was mildly reduced. - Right atrium: The atrium was mildly dilated 2. RBBB 3. Mild coronary artery disease- The left anterior descending artery is moderately calcified. There is a mid 40%-50% stenosis right at the takeoff of the first diagonal artery. D1 - moderate stenosis.  The left circumflex artery is large and is dominant.  80-90% PDA stenosis  The ramus intermediate vessel has minor luminal irregularities. The right coronary artery is small, it is nondominant.  LV gram -  EF  35-40%. There is hypokinesis of the anterior wall, apex, and inferoapical wall. The aortic root appears to be moderately dilated.  4. Hyperlipidemia  History of Present Illness:  81 yo gentleman with a hx of mild CHF. He walks occasionally. He has been exercising on a regular basis. He works out in his garden and walks on his treadmill in the evenings. He's not had any significant episodes of chest pain or shortness breath.   Feb. 6, 2014  Jeffrey Stewart is doing well since I last saw him. He's not had any episodes of chest pain or shortness breath. He's exercising on a  regular basis.  March 10, 2013:  Jeffrey Stewart is doing well. He's not had any episodes of chest pain or shortness breath. Playing golf, fishing, gardening, boating this past summer. Tries to avoid salt. No dypsnea.   Feb. 24, 2015:  Pt is doing OK. No CP. He has some abdominal issues - has 2 hernias. Will be seen the surgeons today.  Sept. 3, 2015:  Jeffrey Stewart is doing well. Had his hernias repaired No CP, no dyspnea. Staying active. Walks regularly.    September 15, 2014:   Jeffrey Stewart is a 81 y.o. male who presents for follow up of his CHF Occasional DOE, no CP.    Aug. 30, 2016:  Doing well.  Has some allergies . No CP or dyspnea.  Works out in the garden   Feb. 28, 2017:  Doing well.  Working in his garden   Oct. 17, 2017: Has had a rough summer. Injured his shoulders working on his floating dock this summer.  Now he is having aches all over , thinks its the atorvastatin  Lots of leg and feet swelling    Past Medical History:  Diagnosis Date  . Allergy   . Arthritis    MINOR ARTHRITIS FEET AND TOES  . Asthma   . Cataract   . CHF (congestive heart failure) ([facility]) 11/2009   EF45-50%  . Coronary artery disease   . Hyperlipidemia   . Myocardial infarction   . Pneumonia    HX OF PNEUMONIA 4 OR 5 YRS AGO  . Rosacea    OF FACE  . Shortness of breath    SOMETIMES SOB  OR WHEEZING WITH EXERTION    Past Surgical History:  Procedure Laterality Date  . APPENDECTOMY     AGE 44  . CARDIAC CATHETERIZATION  11/2009   mild / mod cad,MODERATE TIGHT STENOSIS IN THE DISTAL LEFT CIRCUMFLEX ARTERY  . ELECTROCARDIOGRAM  11/2009   RBBB  . EYE SURGERY     BILATERAL CATARACT EXTRACT EXTRACTION WITH IMPLANTS  . INGUINAL HERNIA REPAIR Bilateral 09/29/2013   Procedure: LAPAROSCOPIC BILATERAL  INGUINAL HERNIA;  Surgeon: [name], MD;  Location: [facility];  Service: General;  Laterality: Bilateral;  . INSERTION OF MESH Bilateral 09/29/2013   Procedure: INSERTION OF MESH;   Surgeon: [name], MD;  Location: [facility];  Service: General;  Laterality: Bilateral;  . last echo 02/2009    . last nuc 2009  12/16/2007   EF 33%. ABNORMAL. 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 US ECHOCARDIOGRAPHY  02/23/2009   EF 45-50%  . US ECHOCARDIOGRAPHY  12/27/2007   EF 45-50%     Current Outpatient Prescriptions  Medication Sig Dispense Refill  . acetaminophen (TYLENOL) 500 MG tablet Take 1,000 mg by mouth every 6 (six) hours as needed for moderate pain.    Marland Kitchen albuterol (PROVENTIL HFA;VENTOLIN HFA) 108 (90 BASE) MCG/ACT inhaler Inhale 1 puff into the lungs every 6 (six) hours as needed for wheezing.     Marland Kitchen aspirin EC 81 MG tablet Take 81 mg by mouth every evening.    Marland Kitchen atorvastatin (LIPITOR) 40 MG tablet Take 1 tablet (40 mg total) by mouth at bedtime. 90 tablet 3  . carvedilol (COREG) 12.5 MG tablet Take 1 tablet (12.5 mg total) by mouth 2 (two) times daily with a meal. 180 tablet 3  . cetirizine (ZYRTEC) 10 MG tablet Take 10 mg by mouth at bedtime.     . cholecalciferol (VITAMIN D) 1000 UNITS tablet Take 1,000 Units by mouth daily.    . fish oil-omega-3 fatty acids 1000 MG capsule Take 1 g by mouth 2 (two) times daily.     . fluticasone (FLONASE) 50 MCG/ACT nasal spray Place 1-2 sprays into both nostrils daily.    Marland Kitchen GLUCOSAMINE PO Take 1 tablet by mouth 2 (two) times daily.     Marland Kitchen lisinopril (PRINIVIL,ZESTRIL) 5 MG tablet Take 1 tablet (5 mg total) by mouth daily. 90 tablet 2  . metroNIDAZOLE (METROCREAM) 0.75 % cream Apply 1 application topically 2 (two) times daily.     . minocycline (DYNACIN) 50 MG tablet Take 50 mg by mouth 2 (two) times daily.    . mometasone (ASMANEX) 220 MCG/INH inhaler Inhale 1 puff into the lungs at bedtime.     . nitroGLYCERIN (NITROSTAT) 0.4 MG SL tablet Place 1 tablet (0.4 mg total) under the tongue every 5 (five) minutes as needed for chest pain. 50 tablet 4  . Polyethyl Glycol-Propyl Glycol (SYSTANE)  0.4-0.3 % SOLN Place 1 drop into both eyes 2 (two) times daily as needed (dry eyes).    . sodium chloride (OCEAN) 0.65 % nasal spray Place 1 spray into the nose as needed for congestion.    . traMADol (ULTRAM) 50 MG tablet Take 50 mg by mouth every 6 (six) hours as needed (for pain).    . niacin (NIASPAN) 500 MG CR tablet Take 1 tablet (500 mg total) by mouth at bedtime. 90 tablet 3   No current facility-administered medications for this visit.     Allergies:   Niaspan [niacin]  Social History:  The patient  reports that he has never smoked. He has never used smokeless tobacco. He reports that he does not drink alcohol or use drugs.   Family History:  The patient's family history includes Diabetes in his sister.    ROS:  Please see the history of present illness.    Review of Systems: Constitutional:  denies fever, chills, diaphoresis, appetite change and fatigue.  HEENT: denies photophobia, eye pain, redness, hearing loss, ear pain, congestion, sore throat, rhinorrhea, sneezing, neck pain, neck stiffness and tinnitus.  Respiratory: admits to SOB, DOE, cough,    Cardiovascular: denies chest pain, palpitations and leg swelling.  Gastrointestinal: denies nausea, vomiting, abdominal pain, diarrhea, constipation, blood in stool.  Genitourinary: denies dysuria, urgency, frequency, hematuria, flank pain and difficulty urinating.  Musculoskeletal: denies  myalgias, back pain, joint swelling, arthralgias and gait problem.   Skin: denies pallor, rash and wound.  Neurological: denies dizziness, seizures, syncope, weakness, light-headedness, numbness and headaches.   Hematological: denies adenopathy, easy bruising, personal or family bleeding history.  Psychiatric/ Behavioral: admits to suicidal ideation, mood changes, confusion, nervousness, sleep disturbance and agitation.       All other systems are reviewed and negative.    PHYSICAL EXAM: VS:  BP 114/60   Pulse 70   Ht 6\' 2"  (1.88  m)   Wt 203 lb (92.1 kg)   SpO2 96%   BMI 26.06 kg/m  , BMI Body mass index is 26.06 kg/m. GEN: Well nourished, well developed, in no acute distress  HEENT: normal  Neck: no JVD, carotid bruits, or masses Cardiac: RRR; no murmurs, rubs, or gallops,no edema  Respiratory:  clear to auscultation bilaterally, normal work of breathing GI: soft, nontender, nondistended, + BS MS: no deformity or atrophy  Skin: warm and dry, no rash Neuro:  Strength and sensation are intact Psych: normal   EKG:  EKG is not ordered today.       Recent Labs: 09/14/2015: ALT 12; BUN 17; Creat 1.19; Potassium 5.2; Sodium 136    Lipid Panel    Component Value Date/Time   CHOL 111 (L) 09/14/2015 0910   TRIG 85 09/14/2015 0910   HDL 35 (L) 09/14/2015 0910   CHOLHDL 3.2 09/14/2015 0910   VLDL 17 09/14/2015 0910   LDLCALC 59 09/14/2015 0910      Wt Readings from Last 3 Encounters:  05/02/16 203 lb (92.1 kg)  09/14/15 207 lb (93.9 kg)  03/16/15 203 lb 1.9 oz (92.1 kg)      Other studies Reviewed: Additional studies/ records that were reviewed today include: . Review of the above records demonstrates:    ASSESSMENT AND PLAN:  1. Congestive heart failure-chronic systolic congestive heart failure His left ventricular systolic function has normalized. He has an EF of 55%. Continue current dose of carvedilol and lisinopril. Has mild Leg edema that I think is due to lack of exercise and perhaps some extra salt. I've advised him to elevate his legs on occasion. He may need to purchase some compression hose. We discussed ambulation.  2. RBBB- stable  3. Mild coronary artery disease-tight stenosis in the distal left circumflex artery- he's not having any episodes of angina.  4. Hyperlipidemia - will check lipids today.  Will see him in 6 months for OV and repeat labs.  He is having lots of vague muscle aches and pains. He doesn't know if its the injury working out on his dock earlier this summer or  perhaps is due to the  atorvastatin. He will hold his atorvastatin for 3-4 weeks to see if this helps the aches and pains. He'll call us at that time to let us know  Current medicines are reviewed at length with the patient today.  The patient does not have concerns regarding medicines.  The following changes have been made:  no change  Disposition:   FU with me in 6 months.     Signed, Mertie Moores, MD  05/02/2016 8:38 AM    [facility] [place], [place], [place]  [address] Phone: [phone]; Fax: [phone]

## 2016-05-02 NOTE — Patient Instructions (Signed)
Your physician recommends that you continue on your current medications as directed. Please refer to the Current Medication list given to you today.  Your physician recommends that you return for lab work today (LIPIDS, LIVER AND BMET)  Your physician wants you to follow-up in: [place]. Jeffrey Stewart.  You will receive a reminder letter in the mail two months in advance. If you don't receive a letter, please call our office to schedule the follow-up appointment.

## 2016-05-16 ENCOUNTER — Telehealth: Payer: Self-pay | Admitting: Cardiovascular Disease

## 2016-05-16 DIAGNOSIS — I5032 Chronic diastolic (congestive) heart failure: Secondary | ICD-10-CM

## 2016-05-16 NOTE — Telephone Encounter (Signed)
Patient was told to stop his Lipitor for 2 weeks to see if that made a difference in how he was feeling.  It has been 2 weeks and he does not feel any differently.  Also, he is experiencing some swelling and wants to know if he can take a low dosage of a fluid pill.  Please call.

## 2016-05-16 NOTE — Telephone Encounter (Signed)
Spoke with patient who states he is having swelling in all extremities and his PCP has prescribed Meloxicam.  He states he did not note any improvement off lipitor and will resume 40 mg daily.  His PCP wants him to try meloxicam for a few weeks and then if he continues to have swelling, she wants to know if Dr. Acie Fredrickson will prescribe a low dose diuretic.  I advised the patient to call back in a few weeks to let us know if the diuretic is needed.  He verbalized understanding and agreement with plan.

## 2016-05-26 NOTE — Telephone Encounter (Signed)
He has diastolic dysfunction Ok to try Lasix 40 mg a day with Kdur 20 meq a day  Check BMP in 2-3 weeks

## 2016-06-02 MED ORDER — POTASSIUM CHLORIDE CRYS ER 20 MEQ PO TBCR: 20.0000 meq | EXTENDED_RELEASE_TABLET | Freq: Every day | ORAL | 11 refills | Status: DC

## 2016-06-02 MED ORDER — FUROSEMIDE 40 MG PO TABS: 40.0000 mg | ORAL_TABLET | Freq: Every day | ORAL | 11 refills | Status: DC

## 2016-06-02 NOTE — Telephone Encounter (Signed)
Left message for patient to call back regarding if diuretics are needed

## 2016-06-02 NOTE — Telephone Encounter (Signed)
Spoke with patient who states PCP advised him yesterday that she does not think patient needs diuretic.  When I spoke with him on the phone I advised that he sounded a little winded.  He states he had just come in from doing something outside. He states he has bilateral lower extremity swelling and does not feel as well as he did a few weeks ago.  I advised that he start lasix 40 mg once daily and kdur 20 meq once daily per Dr. Acie Fredrickson for his chronic diastolic CHF.  I scheduled him for bmet on 12/12.  I advised him to call back if swelling and/or SOB does not improve after starting this therapy.  He verbalized understanding and agreement and thanked me for the call.

## 2016-06-27 ENCOUNTER — Other Ambulatory Visit: Payer: Medicare HMO | Admitting: *Deleted

## 2016-06-27 DIAGNOSIS — I5032 Chronic diastolic (congestive) heart failure: Secondary | ICD-10-CM

## 2016-06-27 LAB — BASIC METABOLIC PANEL WITH GFR
BUN: 24 mg/dL (ref 7–25)
CO2: 25 mmol/L (ref 20–31)
Calcium: 8.7 mg/dL (ref 8.6–10.3)
Chloride: 99 mmol/L (ref 98–110)
Creat: 0.94 mg/dL (ref 0.70–1.18)
Glucose, Bld: 104 mg/dL — ABNORMAL HIGH (ref 65–99)
Potassium: 4.3 mmol/L (ref 3.5–5.3)
Sodium: 134 mmol/L — ABNORMAL LOW (ref 135–146)

## 2016-08-22 ENCOUNTER — Telehealth: Payer: Self-pay | Admitting: Cardiovascular Disease

## 2016-08-22 MED ORDER — FUROSEMIDE 40 MG PO TABS: 40.0000 mg | ORAL_TABLET | Freq: Every day | ORAL | 2 refills | Status: DC

## 2016-08-22 MED ORDER — POTASSIUM CHLORIDE CRYS ER 20 MEQ PO TBCR: 20.0000 meq | EXTENDED_RELEASE_TABLET | Freq: Every day | ORAL | 2 refills | Status: DC

## 2016-08-22 NOTE — Telephone Encounter (Signed)
New Message   *STAT* If patient is at the pharmacy, call can be transferred to refill team.   1. Which medications need to be refilled? (please list name of each medication and dose if known)  furosemide (Lasix) 40 mg tablet once daily potassium chloride SA (K-DUR, KLOR-CON) 20 meq tablet once daily  2. Which pharmacy/location (including street and city if local pharmacy) is medication to be sent to?  [facility]  3. Do they need a 30 day or 90 day supply? 90 day supply

## 2016-10-23 ENCOUNTER — Other Ambulatory Visit: Payer: Self-pay | Admitting: Cardiovascular Disease

## 2016-11-20 ENCOUNTER — Other Ambulatory Visit: Payer: Self-pay | Admitting: Cardiovascular Disease

## 2016-12-15 NOTE — Progress Notes (Signed)
Cardiology Office Note    Date:  12/19/2016   ID:  Jeffrey Stewart, DOB 1935/07/16, MRN [id number]  PCP:  Stephens Shire, MD  Cardiologist:  Dr. Acie Fredrickson  Chief Complaint: 6 months follow up  History of Present Illness:   Jeffrey Stewart is a 82 y.o. male CAD (no stent placement), Chronic systolic heart failure, hyperlipidemia and RBBB presents for follow up.   Last cath in 2011 for chest pain showed 40-45% dig, and 80-90% distal Lcx. Unchanged from prior cath in 2009. Treated medically. EF was 30-35%. Last echocardiogram 09/2013 showed normal LV function to 50-55%, no wm abnormality, mild AI, right ventricle moderately dilated with mildly reduced function.   Here for 6 month follow-up. He has a mild dyspnea with exertion at baseline. This has been stable. He walks 3/4 miles every day without any chest pain. He does have mild dyspnea. Denies any orthopnea, PND, syncope, dizziness, lower extremity edema, palpitations, melena or blood in his stool or urine. Compliant with medications.  Past Medical History:  Diagnosis Date  . Allergy   . Arthritis    MINOR ARTHRITIS FEET AND TOES  . Asthma   . Cataract   . CHF (congestive heart failure) ([place]) 11/2009   EF45-50%  . Coronary artery disease   . Hyperlipidemia   . Myocardial infarction ([place])   . Pneumonia    HX OF PNEUMONIA 4 OR 5 YRS AGO  . Rosacea    OF FACE  . Shortness of breath    SOMETIMES SOB OR WHEEZING WITH EXERTION    Past Surgical History:  Procedure Laterality Date  . APPENDECTOMY     AGE 3  . CARDIAC CATHETERIZATION  11/2009   mild / mod cad,MODERATE TIGHT STENOSIS IN THE DISTAL LEFT CIRCUMFLEX ARTERY  . ELECTROCARDIOGRAM  11/2009   RBBB  . EYE SURGERY     BILATERAL CATARACT EXTRACT EXTRACTION WITH IMPLANTS  . INGUINAL HERNIA REPAIR Bilateral 09/29/2013   Procedure: LAPAROSCOPIC BILATERAL  INGUINAL HERNIA;  Surgeon: [name], MD;  Location: [facility];  Service: General;  Laterality: Bilateral;  . INSERTION OF  MESH Bilateral 09/29/2013   Procedure: INSERTION OF MESH;  Surgeon: [name], MD;  Location: [facility];  Service: General;  Laterality: Bilateral;  . last echo 02/2009    . last nuc 2009  12/16/2007   EF 33%. ABNORMAL. 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 US ECHOCARDIOGRAPHY  02/23/2009   EF 45-50%  . US ECHOCARDIOGRAPHY  12/27/2007   EF 45-50%    Current Medications: Prior to Admission medications   Medication Sig Start Date End Date Taking? Authorizing Provider  acetaminophen (TYLENOL) 500 MG tablet Take 1,000 mg by mouth every 6 (six) hours as needed for moderate pain.    [provider]  albuterol (PROVENTIL HFA;VENTOLIN HFA) 108 (90 BASE) MCG/ACT inhaler Inhale 1 puff into the lungs every 6 (six) hours as needed for wheezing.     [provider]  aspirin EC 81 MG tablet Take 81 mg by mouth every evening.    [provider]  atorvastatin (LIPITOR) 40 MG tablet TAKE 1 TABLET AT BEDTIME 10/24/16   Nahser, Wonda Cheng, MD  carvedilol (COREG) 12.5 MG tablet TAKE 1 TABLET TWICE DAILY WITH MEALS 10/24/16   Nahser, Wonda Cheng, MD  cetirizine (ZYRTEC) 10 MG tablet Take 10 mg by mouth at bedtime.     [provider]  cholecalciferol (VITAMIN D) 1000  UNITS tablet Take 1,000 Units by mouth daily.    [provider]  fish oil-omega-3 fatty acids 1000 MG capsule Take 1 g by mouth 2 (two) times daily.     [provider]  fluticasone (FLONASE) 50 MCG/ACT nasal spray Place 1-2 sprays into both nostrils daily.    [provider]  furosemide (LASIX) 40 MG tablet Take 1 tablet (40 mg total) by mouth daily. 08/22/16 11/20/16  Nahser, Wonda Cheng, MD  GLUCOSAMINE PO Take 1 tablet by mouth 2 (two) times daily.     [provider]  lisinopril (PRINIVIL,ZESTRIL) 5 MG tablet Take 1 tablet (5 mg total) by mouth daily. 11/21/16   Nahser, Wonda Cheng, MD  metroNIDAZOLE (METROCREAM) 0.75 % cream Apply 1 application  topically 2 (two) times daily.     [provider]  minocycline (DYNACIN) 50 MG tablet Take 50 mg by mouth 2 (two) times daily.    [provider]  mometasone (ASMANEX) 220 MCG/INH inhaler Inhale 1 puff into the lungs at bedtime.     [provider]  nitroGLYCERIN (NITROSTAT) 0.4 MG SL tablet Place 1 tablet (0.4 mg total) under the tongue every 5 (five) minutes as needed for chest pain. 09/14/15   Nahser, Wonda Cheng, MD  Polyethyl Glycol-Propyl Glycol (SYSTANE) 0.4-0.3 % SOLN Place 1 drop into both eyes 2 (two) times daily as needed (dry eyes).    [provider]  potassium chloride SA (K-DUR,KLOR-CON) 20 MEQ tablet Take 1 tablet (20 mEq total) by mouth daily. 08/22/16   Nahser, Wonda Cheng, MD  sodium chloride (OCEAN) 0.65 % nasal spray Place 1 spray into the nose as needed for congestion.    [provider]  traMADol (ULTRAM) 50 MG tablet Take 50 mg by mouth every 6 (six) hours as needed (for pain).    [provider]    Allergies:   Niaspan [niacin]   Social History   Social History  . Marital status: Married    Spouse name: N/A  . Number of children: N/A  . Years of education: N/A   Social History Main Topics  . Smoking status: Never Smoker  . Smokeless tobacco: Never Used  . Alcohol use No  . Drug use: No  . Sexual activity: Not Asked   Other Topics Concern  . None   Social History Narrative  . None     Family History:  The patient's family history includes Diabetes in his sister.   ROS:   Please see the history of present illness.    ROS All other systems reviewed and are negative.   PHYSICAL EXAM:   VS:  BP 116/68   Pulse 67   Ht 6' (1.829 m)   Wt 213 lb 12.8 oz (97 kg)   BMI 29.00 kg/m    GEN: Well nourished, well developed, in no acute distress  HEENT: normal  Neck: no JVD, carotid bruits, or masses Cardiac: RRR; no murmurs, rubs, or gallops,no edema  Respiratory:  clear to auscultation bilaterally, normal work  of breathing GI: soft, nontender, nondistended, + BS MS: no deformity or atrophy  Skin: warm and dry, no rash Neuro:  Alert and Oriented x 3, Strength and sensation are intact Psych: euthymic mood, full affect  Wt Readings from Last 3 Encounters:  12/19/16 213 lb 12.8 oz (97 kg)  05/02/16 203 lb (92.1 kg)  09/14/15 207 lb (93.9 kg)      Studies/Labs Reviewed:   EKG:  EKG is ordered today.  The ekg ordered today demonstrates NSR with chronic RBBB  Recent Labs: 05/02/2016: ALT 8 06/27/2016: BUN 24; Creat 0.94; Potassium 4.3; Sodium 134   Lipid Panel    Component Value Date/Time   CHOL 85 (L) 05/02/2016 0856   TRIG 74 05/02/2016 0856   HDL 23 (L) 05/02/2016 0856   CHOLHDL 3.7 05/02/2016 0856   VLDL 15 05/02/2016 0856   LDLCALC 47 05/02/2016 0856    Additional studies/ records that were reviewed today include:   Echocardiogram: 09/2013 Study Conclusions  - Left ventricle: The cavity size was moderately to severely dilated. Wall thickness was normal. Systolic function was normal. The estimated ejection fraction was in the range of 50% to 55%. Wall motion was normal; there were no regional wall motion abnormalities. There was an increased relative contribution of atrial contraction to ventricular filling. - Aortic valve: Mild regurgitation. - Left atrium: The atrium was mildly dilated. - Right ventricle: The cavity size was moderately dilated. Systolic function was mildly reduced. - Right atrium: The atrium was mildly dilated. Transthoracic echocardiography. M-mode, complete     ASSESSMENT & PLAN:    1. Mild to moderate CAD (no stent placement) - Managed medically. Continue ASA and statin. No chest pain.   2. Chronic systolic heart failure - Last echocardiogram showed normalLV function to 50-55%. No sign of CHF. He does have a chronic dyspnea on exertion. Seems like due to deconditioning. Advise continue exercise regimen and increase as tolerated.  His dyspnea could be from moderate CAD. However, his symptoms has been stable. No need for any evaluation currently. If worsen he will let us know. - Continue beta blocker, ACE and diuretics.  3. Hyperlipidemia - Get lipid panel and CMP (on diuretics) today. Continue statin.    Medication Adjustments/Labs and Tests Ordered: Current medicines are reviewed at length with the patient today.  Concerns regarding medicines are outlined above.  Medication changes, Labs and Tests ordered today are listed in the Patient Instructions below. Patient Instructions  Medication Instructions:  Your physician recommends that you continue on your current medications as directed. Please refer to the Current Medication list given to you today.   Labwork: TODAY:  LIPID & CMP  Testing/Procedures: None ordered  Follow-Up: Your physician wants you to follow-up in: [place] DR. Acie Fredrickson  You will receive a reminder letter in the mail two months in advance. If you don't receive a letter, please call our office to schedule the follow-up appointment.    Any Other Special Instructions Will Be Listed Below (If Applicable).     If you need a refill on your cardiac medications before your next appointment, please call your pharmacy.      Jarrett Soho, [place]  12/19/2016 8:11 AM    [address], [place], [place]  [address] Phone: [phone]; Fax: [phone]

## 2016-12-19 ENCOUNTER — Ambulatory Visit (INDEPENDENT_AMBULATORY_CARE_PROVIDER_SITE_OTHER): Payer: Medicare HMO | Admitting: Physician Assistant

## 2016-12-19 ENCOUNTER — Encounter: Payer: Self-pay | Admitting: Physician Assistant

## 2016-12-19 VITALS — BP 116/68 | HR 67 | Ht 72.0 in | Wt 213.8 lb

## 2016-12-19 DIAGNOSIS — I2510 Atherosclerotic heart disease of native coronary artery without angina pectoris: Secondary | ICD-10-CM

## 2016-12-19 DIAGNOSIS — E782 Mixed hyperlipidemia: Secondary | ICD-10-CM | POA: Diagnosis not present

## 2016-12-19 DIAGNOSIS — I5032 Chronic diastolic (congestive) heart failure: Secondary | ICD-10-CM

## 2016-12-19 LAB — LIPID PANEL
CHOL/HDL RATIO: 3.4 ratio (ref 0.0–5.0)
Cholesterol, Total: 115 mg/dL (ref 100–199)
HDL: 34 mg/dL — ABNORMAL LOW (ref >39)
LDL Calculated: 55 mg/dL (ref 0–99)
Triglycerides: 128 mg/dL (ref 0–149)
VLDL Cholesterol Cal: 26 mg/dL (ref 5–40)

## 2016-12-19 LAB — COMPREHENSIVE METABOLIC PANEL
ALT: 15 IU/L (ref 0–44)
AST: 17 IU/L (ref 0–40)
Albumin/Globulin Ratio: 2.3 — ABNORMAL HIGH (ref 1.2–2.2)
Albumin: 4.2 g/dL (ref 3.5–4.8)
Alkaline Phosphatase: 51 IU/L (ref 39–117)
BUN/Creatinine Ratio: 17 (ref 10–24)
BUN: 16 mg/dL (ref 8–27)
Bilirubin Total: 1.3 mg/dL — ABNORMAL HIGH (ref 0.0–1.2)
CALCIUM: 9 mg/dL (ref 8.6–10.2)
CHLORIDE: 97 mmol/L (ref 96–106)
CO2: 25 mmol/L (ref 18–29)
Creatinine, Ser: 0.92 mg/dL (ref 0.76–1.27)
GFR calc non Af Amer: 79 mL/min/{1.73_m2} (ref >59)
GFR, EST AFRICAN AMERICAN: 91 mL/min/{1.73_m2} (ref >59)
GLUCOSE: 104 mg/dL — AB (ref 65–99)
Globulin, Total: 1.8 g/dL (ref 1.5–4.5)
Potassium: 4.4 mmol/L (ref 3.5–5.2)
Sodium: 136 mmol/L (ref 134–144)
TOTAL PROTEIN: 6 g/dL (ref 6.0–8.5)

## 2016-12-19 NOTE — Patient Instructions (Addendum)
Medication Instructions:  Your physician recommends that you continue on your current medications as directed. Please refer to the Current Medication list given to you today.   Labwork: TODAY:  LIPID & CMP  Testing/Procedures: None ordered  Follow-Up: Your physician wants you to follow-up in: [place] DR. Acie Fredrickson  You will receive a reminder letter in the mail two months in advance. If you don't receive a letter, please call our office to schedule the follow-up appointment.    Any Other Special Instructions Will Be Listed Below (If Applicable).     If you need a refill on your cardiac medications before your next appointment, please call your pharmacy.

## 2017-03-12 ENCOUNTER — Other Ambulatory Visit: Payer: Self-pay | Admitting: Cardiovascular Disease

## 2017-04-09 ENCOUNTER — Other Ambulatory Visit: Payer: Self-pay | Admitting: Cardiovascular Disease

## 2017-07-21 ENCOUNTER — Other Ambulatory Visit: Payer: Self-pay

## 2017-07-21 ENCOUNTER — Emergency Department (HOSPITAL_COMMUNITY): Payer: No Typology Code available for payment source

## 2017-07-21 ENCOUNTER — Encounter (HOSPITAL_COMMUNITY): Payer: Self-pay | Admitting: *Deleted

## 2017-07-21 ENCOUNTER — Emergency Department (HOSPITAL_COMMUNITY)
Admission: EM | Admit: 2017-07-21 | Discharge: 2017-07-21 | Disposition: A | Discharge status: Home / Self Care | Payer: No Typology Code available for payment source | Attending: Emergency Medicine | Admitting: Emergency Medicine

## 2017-07-21 DIAGNOSIS — I2510 Atherosclerotic heart disease of native coronary artery without angina pectoris: Secondary | ICD-10-CM | POA: Insufficient documentation

## 2017-07-21 DIAGNOSIS — M79672 Pain in left foot: Secondary | ICD-10-CM

## 2017-07-21 DIAGNOSIS — Y999 Unspecified external cause status: Secondary | ICD-10-CM | POA: Insufficient documentation

## 2017-07-21 DIAGNOSIS — Z7982 Long term (current) use of aspirin: Secondary | ICD-10-CM | POA: Diagnosis not present

## 2017-07-21 DIAGNOSIS — Z79899 Other long term (current) drug therapy: Secondary | ICD-10-CM | POA: Diagnosis not present

## 2017-07-21 DIAGNOSIS — Y92410 Unspecified street and highway as the place of occurrence of the external cause: Secondary | ICD-10-CM | POA: Insufficient documentation

## 2017-07-21 DIAGNOSIS — Y9389 Activity, other specified: Secondary | ICD-10-CM | POA: Insufficient documentation

## 2017-07-21 DIAGNOSIS — S139XXA Sprain of joints and ligaments of unspecified parts of neck, initial encounter: Secondary | ICD-10-CM | POA: Insufficient documentation

## 2017-07-21 DIAGNOSIS — J45909 Unspecified asthma, uncomplicated: Secondary | ICD-10-CM | POA: Insufficient documentation

## 2017-07-21 DIAGNOSIS — M25552 Pain in left hip: Secondary | ICD-10-CM

## 2017-07-21 DIAGNOSIS — I509 Heart failure, unspecified: Secondary | ICD-10-CM | POA: Insufficient documentation

## 2017-07-21 DIAGNOSIS — S199XXA Unspecified injury of neck, initial encounter: Secondary | ICD-10-CM | POA: Diagnosis present

## 2017-07-21 MED ORDER — HYDROCODONE-ACETAMINOPHEN 5-325 MG PO TABS
2.0000 | ORAL_TABLET | Freq: Once | ORAL | Status: AC
  Administered 2017-07-21: 2 via ORAL
  Filled 2017-07-21: qty 2
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
## 2017-07-21 NOTE — ED Notes (Signed)
Pt ambulated independently. States some pain with ambulation, but he is able to tolerate it.
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
## 2017-07-21 NOTE — ED Notes (Signed)
Pt verbalized understanding of discharge instructions. Unable to sign, hallway pt.

## 2017-07-27 ENCOUNTER — Encounter: Payer: Self-pay | Admitting: Cardiovascular Disease

## 2017-07-27 ENCOUNTER — Ambulatory Visit: Payer: Medicare HMO | Admitting: Cardiovascular Disease

## 2017-07-27 VITALS — BP 102/66 | HR 68 | Ht 72.0 in | Wt 209.0 lb

## 2017-07-27 DIAGNOSIS — I5022 Chronic systolic (congestive) heart failure: Secondary | ICD-10-CM

## 2017-07-27 DIAGNOSIS — I2510 Atherosclerotic heart disease of native coronary artery without angina pectoris: Secondary | ICD-10-CM

## 2017-07-27 DIAGNOSIS — E782 Mixed hyperlipidemia: Secondary | ICD-10-CM | POA: Diagnosis not present

## 2017-07-27 LAB — HEPATIC FUNCTION PANEL
ALT: 13 IU/L (ref 0–44)
AST: 16 IU/L (ref 0–40)
Albumin: 4.2 g/dL (ref 3.5–4.7)
Alkaline Phosphatase: 60 IU/L (ref 39–117)
BILIRUBIN, DIRECT: 0.31 mg/dL (ref 0.00–0.40)
Bilirubin Total: 1.2 mg/dL (ref 0.0–1.2)
TOTAL PROTEIN: 6.3 g/dL (ref 6.0–8.5)

## 2017-07-27 LAB — BASIC METABOLIC PANEL
BUN/Creatinine Ratio: 18 (ref 10–24)
BUN: 20 mg/dL (ref 8–27)
CHLORIDE: 96 mmol/L (ref 96–106)
CO2: 26 mmol/L (ref 20–29)
Calcium: 9.3 mg/dL (ref 8.6–10.2)
Creatinine, Ser: 1.09 mg/dL (ref 0.76–1.27)
GFR calc Af Amer: 74 mL/min/{1.73_m2} (ref >59)
GFR calc non Af Amer: 64 mL/min/{1.73_m2} (ref >59)
GLUCOSE: 121 mg/dL — AB (ref 65–99)
Potassium: 4.7 mmol/L (ref 3.5–5.2)
Sodium: 134 mmol/L (ref 134–144)

## 2017-07-27 LAB — LIPID PANEL
CHOL/HDL RATIO: 3.3 ratio (ref 0.0–5.0)
Cholesterol, Total: 109 mg/dL (ref 100–199)
HDL: 33 mg/dL — ABNORMAL LOW (ref >39)
LDL Calculated: 48 mg/dL (ref 0–99)
Triglycerides: 142 mg/dL (ref 0–149)
VLDL Cholesterol Cal: 28 mg/dL (ref 5–40)

## 2017-07-27 NOTE — Progress Notes (Signed)
Cardiology Office Note   Date:  07/27/2017   ID:  KARAN RAMNAUTH, DOB 07/24/1934, MRN [id number]  PCP:  Christain Sacramento, MD  Cardiologist: Mertie Moores, MD   Chief Complaint  Patient presents with  . Follow-up    CHF    1. Congestive heart failure-chronic systolic congestive heart failure Left ventricle: The cavity size was moderately to severely dilated. Wall thickness was normal. Systolic function was normal. The estimated ejection fraction was in the range of 50% to 55%. Wall motion was normal; there were no regional wall motion abnormalities. There was an increased relative contribution of atrial contraction to ventricular filling. - Aortic valve: Mild regurgitation. - Left atrium: The atrium was mildly dilated. - Right ventricle: The cavity size was moderately dilated. Systolic function was mildly reduced. - Right atrium: The atrium was mildly dilated 2. RBBB 3. Mild coronary artery disease- The left anterior descending artery is moderately calcified. There is a mid 40%-50% stenosis right at the takeoff of the first diagonal artery. D1 - moderate stenosis.  The left circumflex artery is large and is dominant.  80-90% PDA stenosis  The ramus intermediate vessel has minor luminal irregularities. The right coronary artery is small, it is nondominant.  LV gram -  EF  35-40%. There is hypokinesis of the anterior wall, apex, and inferoapical wall. The aortic root appears to be moderately dilated.  4. Hyperlipidemia  History of Present Illness:  83 yo gentleman with a hx of mild CHF. He walks occasionally. He has been exercising on a regular basis. He works out in his garden and walks on his treadmill in the evenings. He's not had any significant episodes of chest pain or shortness breath.   Feb. 6, 2014  Janet is doing well since I last saw him. He's not had any episodes of chest pain or shortness breath. He's exercising on a regular  basis.  March 10, 2013:  Leopold is doing well. He's not had any episodes of chest pain or shortness breath. Playing golf, fishing, gardening, boating this past summer. Tries to avoid salt. No dypsnea.   Feb. 24, 2015:  Pt is doing OK. No CP. He has some abdominal issues - has 2 hernias. Will be seen the surgeons today.  Sept. 3, 2015:  Burle is doing well. Had his hernias repaired No CP, no dyspnea. Staying active. Walks regularly.    September 15, 2014:   DAIVD FREDERICKSEN is a 83 y.o. male who presents for follow up of his CHF Occasional DOE, no CP.    Aug. 30, 2016:  Doing well.  Has some allergies . No CP or dyspnea.  Works out in the garden   Feb. 28, 2017:  Doing well.  Working in his garden   Oct. 17, 2017: Has had a rough summer. Injured his shoulders working on his floating dock this summer.  Now he is having aches all over , thinks its the atorvastatin  Lots of leg and feet swelling   Jan. 11, 2019:   history of chronic systolic congestive heart failure.  His last echocardiogram reveals normal left ventricular systolic function. He still has some occasional shortness of breath with exertion. He does not exercise quite as much as he used to.  Is raising his great grandchildren .     Past Medical History:  Diagnosis Date  . Allergy   . Arthritis    MINOR ARTHRITIS FEET AND TOES  . Asthma   . Cataract   .  CHF (congestive heart failure) ([place]) 11/2009   EF45-50%  . Coronary artery disease   . Hyperlipidemia   . Myocardial infarction ([place])   . Pneumonia    HX OF PNEUMONIA 4 OR 5 YRS AGO  . Rosacea    OF FACE  . Shortness of breath    SOMETIMES SOB OR WHEEZING WITH EXERTION    Past Surgical History:  Procedure Laterality Date  . APPENDECTOMY     AGE 71  . CARDIAC CATHETERIZATION  11/2009   mild / mod cad,MODERATE TIGHT STENOSIS IN THE DISTAL LEFT CIRCUMFLEX ARTERY  . ELECTROCARDIOGRAM  11/2009   RBBB  . EYE SURGERY     BILATERAL CATARACT  EXTRACT EXTRACTION WITH IMPLANTS  . INGUINAL HERNIA REPAIR Bilateral 09/29/2013   Procedure: LAPAROSCOPIC BILATERAL  INGUINAL HERNIA;  Surgeon: [name], MD;  Location: [facility];  Service: General;  Laterality: Bilateral;  . INSERTION OF MESH Bilateral 09/29/2013   Procedure: INSERTION OF MESH;  Surgeon: [name], MD;  Location: [facility];  Service: General;  Laterality: Bilateral;  . last echo 02/2009    . last nuc 2009  12/16/2007   EF 33%. ABNORMAL. 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 US ECHOCARDIOGRAPHY  02/23/2009   EF 45-50%  . US ECHOCARDIOGRAPHY  12/27/2007   EF 45-50%     Current Outpatient Medications  Medication Sig Dispense Refill  . acetaminophen (TYLENOL) 500 MG tablet Take 1,000 mg by mouth every 6 (six) hours as needed for moderate pain.    Marland Kitchen albuterol (PROVENTIL HFA;VENTOLIN HFA) 108 (90 BASE) MCG/ACT inhaler Inhale 1 puff into the lungs every 6 (six) hours as needed for wheezing.     Marland Kitchen aspirin EC 81 MG tablet Take 81 mg by mouth every evening.    Marland Kitchen atorvastatin (LIPITOR) 40 MG tablet TAKE 1 TABLET AT BEDTIME 90 tablet 3  . carvedilol (COREG) 12.5 MG tablet TAKE 1 TABLET TWICE DAILY WITH MEALS 180 tablet 3  . cetirizine (ZYRTEC) 10 MG tablet Take 10 mg by mouth at bedtime.     . cholecalciferol (VITAMIN D) 1000 UNITS tablet Take 1,000 Units by mouth daily.    . fish oil-omega-3 fatty acids 1000 MG capsule Take 1 g by mouth 2 (two) times daily.     . fluticasone (FLONASE) 50 MCG/ACT nasal spray Place 1-2 sprays into both nostrils daily.    . furosemide (LASIX) 40 MG tablet Take 1 tablet (40 mg total) by mouth daily. 90 tablet 2  . GLUCOSAMINE PO Take 1 tablet by mouth 2 (two) times daily.     Marland Kitchen lisinopril (PRINIVIL,ZESTRIL) 5 MG tablet Take 1 tablet (5 mg total) by mouth daily. 90 tablet 2  . metroNIDAZOLE (METROCREAM) 0.75 % cream Apply 1 application topically 2 (two) times daily.     . minocycline (DYNACIN) 50 MG tablet  Take 50 mg by mouth 2 (two) times daily.    . mometasone (ASMANEX) 220 MCG/INH inhaler Inhale 1 puff into the lungs at bedtime.     . nitroGLYCERIN (NITROSTAT) 0.4 MG SL tablet Place 1 tablet (0.4 mg total) under the tongue every 5 (five) minutes as needed for chest pain. 50 tablet 4  . Polyethyl Glycol-Propyl Glycol (SYSTANE) 0.4-0.3 % SOLN Place 1 drop into both eyes 2 (two) times daily as needed (dry eyes).    . potassium chloride SA (KLOR-CON M20) 20 MEQ tablet Take 1 tablet (20 mEq total) by mouth daily. 90 tablet  2  . predniSONE (DELTASONE) 5 MG tablet TAKE 5 MG TABLET BY MOUTH IN THE MORNING AND 1 MG TABLET BY MOUTH AT BEDTIME    . sodium chloride (OCEAN) 0.65 % nasal spray Place 1 spray into the nose as needed for congestion.     No current facility-administered medications for this visit.     Allergies:   Niaspan [niacin]    Social History:  The patient  reports that  has never smoked. he has never used smokeless tobacco. He reports that he does not drink alcohol or use drugs.   Family History:  The patient's family history includes Diabetes in his sister.    ROS:   Noted in current hx.  Otherwise negative.   Physical Exam: Blood pressure 102/66, pulse 68, height 6' (1.829 m), weight 209 lb (94.8 kg), SpO2 96 %.  GEN:  Well nourished, well developed in no acute distress HEENT: Normal NECK: No JVD; No carotid bruits LYMPHATICS: No lymphadenopathy CARDIAC: RR RESPIRATORY:  Clear to auscultation without rales, wheezing or rhonchi  ABDOMEN: Soft, non-tender, non-distended MUSCULOSKELETAL:  No edema; No deformity  SKIN: Warm and dry NEUROLOGIC:  Alert and oriented x 3    EKG:  EKG is not ordered today.       Recent Labs: 12/19/2016: ALT 15; BUN 16; Creatinine, Ser 0.92; Potassium 4.4; Sodium 136    Lipid Panel    Component Value Date/Time   CHOL 115 12/19/2016 0813   TRIG 128 12/19/2016 0813   HDL 34 (L) 12/19/2016 0813   CHOLHDL 3.4 12/19/2016 0813   CHOLHDL  3.7 05/02/2016 0856   VLDL 15 05/02/2016 0856   LDLCALC 55 12/19/2016 0813      Wt Readings from Last 3 Encounters:  07/27/17 209 lb (94.8 kg)  07/21/17 213 lb (96.6 kg)  12/19/16 213 lb 12.8 oz (97 kg)      Other studies Reviewed: Additional studies/ records that were reviewed today include: . Review of the above records demonstrates:    ASSESSMENT AND PLAN:  1. Congestive heart failure-chronic systolic congestive heart failure His left ventricular systolic function has normalized.  Continue current medications. Seems to be doing well   2. RBBB- stable  3. Mild coronary artery disease-tight stenosis in the distal left circumflex artery-  He is not having any episodes of angina.  4. Hyperlipidemia -continue atorvastatin.  Will check fasting labs today.  Current medicines are reviewed at length with the patient today.  The patient does not have concerns regarding medicines.  The following changes have been made:  no change  Disposition:   FU with me in 6 months.     Signed, Mertie Moores, MD  07/27/2017 10:54 AM    [facility] [place], [place], [place]  [address] Phone: [phone]; Fax: [phone]

## 2017-07-27 NOTE — Patient Instructions (Signed)
Medication Instructions:  Your physician recommends that you continue on your current medications as directed. Please refer to the Current Medication list given to you today.   Labwork: TODAY - liver panel, basic metabolic panel, cholesterol   Testing/Procedures: None Ordered   Follow-Up: Your physician wants you to follow-up in: 6 months with Dr. Nahser.  You will receive a reminder letter in the mail two months in advance. If you don't receive a letter, please call our office to schedule the follow-up appointment.   If you need a refill on your cardiac medications before your next appointment, please call your pharmacy.   Thank you for choosing [facility]! Etherine Mackowiak, RN [phone]    

## 2017-08-13 ENCOUNTER — Other Ambulatory Visit: Payer: Self-pay | Admitting: Cardiovascular Disease

## 2017-10-16 ENCOUNTER — Other Ambulatory Visit: Payer: Self-pay | Admitting: Cardiovascular Disease

## 2017-11-28 ENCOUNTER — Other Ambulatory Visit: Payer: Self-pay | Admitting: Cardiovascular Disease

## 2018-01-28 ENCOUNTER — Ambulatory Visit: Payer: Medicare HMO | Admitting: Cardiovascular Disease

## 2018-01-28 ENCOUNTER — Encounter: Payer: Self-pay | Admitting: Cardiovascular Disease

## 2018-01-28 VITALS — BP 110/68 | HR 66 | Ht 72.0 in | Wt 212.5 lb

## 2018-01-28 DIAGNOSIS — I2510 Atherosclerotic heart disease of native coronary artery without angina pectoris: Secondary | ICD-10-CM | POA: Diagnosis not present

## 2018-01-28 DIAGNOSIS — I5022 Chronic systolic (congestive) heart failure: Secondary | ICD-10-CM | POA: Diagnosis not present

## 2018-01-28 DIAGNOSIS — E782 Mixed hyperlipidemia: Secondary | ICD-10-CM | POA: Diagnosis not present

## 2018-01-28 LAB — BASIC METABOLIC PANEL
BUN/Creatinine Ratio: 17 (ref 10–24)
BUN: 20 mg/dL (ref 8–27)
CALCIUM: 9.5 mg/dL (ref 8.6–10.2)
CO2: 23 mmol/L (ref 20–29)
CREATININE: 1.18 mg/dL (ref 0.76–1.27)
Chloride: 97 mmol/L (ref 96–106)
GFR, EST AFRICAN AMERICAN: 67 mL/min/{1.73_m2} (ref >59)
GFR, EST NON AFRICAN AMERICAN: 58 mL/min/{1.73_m2} — AB (ref >59)
Glucose: 107 mg/dL — ABNORMAL HIGH (ref 65–99)
Potassium: 4.5 mmol/L (ref 3.5–5.2)
SODIUM: 135 mmol/L (ref 134–144)

## 2018-01-28 LAB — HEPATIC FUNCTION PANEL
ALT: 9 IU/L (ref 0–44)
AST: 13 IU/L (ref 0–40)
Albumin: 4.3 g/dL (ref 3.5–4.7)
Alkaline Phosphatase: 56 IU/L (ref 39–117)
BILIRUBIN, DIRECT: 0.22 mg/dL (ref 0.00–0.40)
Bilirubin Total: 0.9 mg/dL (ref 0.0–1.2)
TOTAL PROTEIN: 6.2 g/dL (ref 6.0–8.5)

## 2018-01-28 LAB — LIPID PANEL
CHOL/HDL RATIO: 3.4 ratio (ref 0.0–5.0)
Cholesterol, Total: 109 mg/dL (ref 100–199)
HDL: 32 mg/dL — AB (ref >39)
LDL CALC: 52 mg/dL (ref 0–99)
TRIGLYCERIDES: 125 mg/dL (ref 0–149)
VLDL Cholesterol Cal: 25 mg/dL (ref 5–40)

## 2018-01-28 NOTE — Patient Instructions (Signed)
Medication Instructions:  Your physician recommends that you continue on your current medications as directed. Please refer to the Current Medication list given to you today.   Labwork: TODAY - cholesterol, liver panel, basic metabolic panel   Testing/Procedures: None Ordered   Follow-Up: Your physician wants you to follow-up in: 6 months with a PA or PA on Dr. Elmarie Shiley team. You will receive a reminder letter in the mail two months in advance. If you don't receive a letter, please call our office to schedule the follow-up appointment.   If you need a refill on your cardiac medications before your next appointment, please call your pharmacy.   Thank you for choosing [facility]! Christen Bame, RN [phone]

## 2018-01-28 NOTE — Progress Notes (Signed)
Cardiology Office Note   Date:  01/28/2018   ID:  MATTHER LABELL, DOB 11/09/1934, MRN [id number]  PCP:  Christain Sacramento, MD  Cardiologist: Mertie Moores, MD   Chief Complaint  Patient presents with  . Coronary Artery Disease   1. Congestive heart failure-chronic systolic congestive heart failure Left ventricle: The cavity size was moderately to severely dilated. Wall thickness was normal. Systolic function was normal. The estimated ejection fraction was in the range of 50% to 55%. Wall motion was normal; there were no regional wall motion abnormalities. There was an increased relative contribution of atrial contraction to ventricular filling. - Aortic valve: Mild regurgitation. - Left atrium: The atrium was mildly dilated. - Right ventricle: The cavity size was moderately dilated. Systolic function was mildly reduced. - Right atrium: The atrium was mildly dilated 2. RBBB 3. Mild coronary artery disease- The left anterior descending artery is moderately calcified. There is a mid 40%-50% stenosis right at the takeoff of the first diagonal artery. D1 - moderate stenosis.  The left circumflex artery is large and is dominant.  80-90% PDA stenosis  The ramus intermediate vessel has minor luminal irregularities. The right coronary artery is small, it is nondominant.  LV gram -  EF  35-40%. There is hypokinesis of the anterior wall, apex, and inferoapical wall. The aortic root appears to be moderately dilated.  4. Hyperlipidemia   83 yo gentleman with a hx of mild CHF. He walks occasionally. He has been exercising on a regular basis. He works out in his garden and walks on his treadmill in the evenings. He's not had any significant episodes of chest pain or shortness breath.   Feb. 6, 2014  Urban is doing well since I last saw him. He's not had any episodes of chest pain or shortness breath. He's exercising on a regular basis.  March 10, 2013:  Jamill is doing well. He's not had any episodes of chest pain or shortness breath. Playing golf, fishing, gardening, boating this past summer. Tries to avoid salt. No dypsnea.   Feb. 24, 2015:  Pt is doing OK. No CP. He has some abdominal issues - has 2 hernias. Will be seen the surgeons today.  Sept. 3, 2015:  Flor is doing well. Had his hernias repaired No CP, no dyspnea. Staying active. Walks regularly.    September 15, 2014:   KENDRIX ORMAN is a 83 y.o. male who presents for follow up of his CHF Occasional DOE, no CP.    Aug. 30, 2016:  Doing well.  Has some allergies . No CP or dyspnea.  Works out in the garden   Feb. 28, 2017:  Doing well.  Working in his garden   Oct. 17, 2017: Has had a rough summer. Injured his shoulders working on his floating dock this summer.  Now he is having aches all over , thinks its the atorvastatin  Lots of leg and feet swelling   Jan. 11, 2019:   history of chronic systolic congestive heart failure.  His last echocardiogram reveals normal left ventricular systolic function. He still has some occasional shortness of breath with exertion. He does not exercise quite as much as he used to.  Is raising his great grandchildren .  January 28, 2018: Doing ok Raising 2 of his great grandchildren - 2 teenage girls. No CP,  Some Doe, Raising his garden       Past Medical History:  Diagnosis Date  . Allergy   .  Arthritis    MINOR ARTHRITIS FEET AND TOES  . Asthma   . Cataract   . CHF (congestive heart failure) ([place]) 11/2009   EF45-50%  . Coronary artery disease   . Hyperlipidemia   . Myocardial infarction ([place])   . Pneumonia    HX OF PNEUMONIA 4 OR 5 YRS AGO  . Rosacea    OF FACE  . Shortness of breath    SOMETIMES SOB OR WHEEZING WITH EXERTION    Past Surgical History:  Procedure Laterality Date  . APPENDECTOMY     AGE 67  . CARDIAC CATHETERIZATION  11/2009   mild / mod cad,MODERATE TIGHT STENOSIS IN THE DISTAL  LEFT CIRCUMFLEX ARTERY  . ELECTROCARDIOGRAM  11/2009   RBBB  . EYE SURGERY     BILATERAL CATARACT EXTRACT EXTRACTION WITH IMPLANTS  . INGUINAL HERNIA REPAIR Bilateral 09/29/2013   Procedure: LAPAROSCOPIC BILATERAL  INGUINAL HERNIA;  Surgeon: [name], MD;  Location: [facility];  Service: General;  Laterality: Bilateral;  . INSERTION OF MESH Bilateral 09/29/2013   Procedure: INSERTION OF MESH;  Surgeon: [name], MD;  Location: [facility];  Service: General;  Laterality: Bilateral;  . last echo 02/2009    . last nuc 2009  12/16/2007   EF 33%. ABNORMAL. 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 US ECHOCARDIOGRAPHY  02/23/2009   EF 45-50%  . US ECHOCARDIOGRAPHY  12/27/2007   EF 45-50%     Current Outpatient Medications  Medication Sig Dispense Refill  . acetaminophen (TYLENOL) 500 MG tablet Take 1,000 mg by mouth every 6 (six) hours as needed for moderate pain.    Marland Kitchen albuterol (PROVENTIL HFA;VENTOLIN HFA) 108 (90 BASE) MCG/ACT inhaler Inhale 1 puff into the lungs every 6 (six) hours as needed for wheezing.     Marland Kitchen aspirin EC 81 MG tablet Take 81 mg by mouth every evening.    Marland Kitchen atorvastatin (LIPITOR) 40 MG tablet TAKE 1 TABLET AT BEDTIME 90 tablet 3  . carvedilol (COREG) 12.5 MG tablet TAKE 1 TABLET TWICE DAILY WITH MEALS 180 tablet 3  . cetirizine (ZYRTEC) 10 MG tablet Take 10 mg by mouth at bedtime.     . cholecalciferol (VITAMIN D) 1000 UNITS tablet Take 1,000 Units by mouth daily.    . fish oil-omega-3 fatty acids 1000 MG capsule Take 1 g by mouth 2 (two) times daily.     . fluticasone (FLONASE) 50 MCG/ACT nasal spray Place 1-2 sprays into both nostrils daily.    . furosemide (LASIX) 40 MG tablet TAKE 1 TABLET (40 MG TOTAL) BY MOUTH DAILY. 90 tablet 2  . GLUCOSAMINE PO Take 1 tablet by mouth 2 (two) times daily.     Marland Kitchen lisinopril (PRINIVIL,ZESTRIL) 5 MG tablet TAKE 1 TABLET (5 MG TOTAL) BY MOUTH DAILY. 90 tablet 3  . metroNIDAZOLE (METROCREAM) 0.75  % cream Apply 1 application topically 2 (two) times daily.     . minocycline (DYNACIN) 50 MG tablet Take 50 mg by mouth 2 (two) times daily.    . mometasone (ASMANEX) 220 MCG/INH inhaler Inhale 1 puff into the lungs at bedtime.     . nitroGLYCERIN (NITROSTAT) 0.4 MG SL tablet Place 1 tablet (0.4 mg total) under the tongue every 5 (five) minutes as needed for chest pain. 50 tablet 4  . Polyethyl Glycol-Propyl Glycol (SYSTANE) 0.4-0.3 % SOLN Place 1 drop into both eyes 2 (two) times daily as needed (dry eyes).    Marland Kitchen  potassium chloride SA (K-DUR,KLOR-CON) 20 MEQ tablet TAKE 1 TABLET (20 MEQ TOTAL) BY MOUTH DAILY. 90 tablet 3  . predniSONE (DELTASONE) 5 MG tablet TAKE 5 MG TABLET BY MOUTH IN THE MORNING AND 1 MG TABLET BY MOUTH AT BEDTIME    . sodium chloride (OCEAN) 0.65 % nasal spray Place 1 spray into the nose as needed for congestion.     No current facility-administered medications for this visit.     Allergies:   Niaspan [niacin]    Social History:  The patient  reports that he has never smoked. He has never used smokeless tobacco. He reports that he does not drink alcohol or use drugs.   Family History:  The patient's family history includes Diabetes in his sister.    ROS:   Noted in current hx.  Otherwise negative.   Physical Exam: Blood pressure 110/68, pulse 66, height 6' (1.829 m), weight 212 lb 8 oz (96.4 kg), SpO2 96 %.  GEN:  Well nourished, well developed in no acute distress HEENT: Normal NECK: No JVD; No carotid bruits LYMPHATICS: No lymphadenopathy CARDIAC: RRR RESPIRATORY:  Clear to auscultation without rales, wheezing or rhonchi  ABDOMEN: Soft, non-tender, non-distended MUSCULOSKELETAL:  No edema; No deformity  SKIN: Warm and dry NEUROLOGIC:  Alert and oriented x 3     EKG:   January 28, 2018 NSR with RBBB .  Possible Inf. MI        Recent Labs: 07/27/2017: ALT 13; BUN 20; Creatinine, Ser 1.09; Potassium 4.7; Sodium 134    Lipid Panel    Component Value  Date/Time   CHOL 109 07/27/2017 1112   TRIG 142 07/27/2017 1112   HDL 33 (L) 07/27/2017 1112   CHOLHDL 3.3 07/27/2017 1112   CHOLHDL 3.7 05/02/2016 0856   VLDL 15 05/02/2016 0856   LDLCALC 48 07/27/2017 1112      Wt Readings from Last 3 Encounters:  01/28/18 212 lb 8 oz (96.4 kg)  07/27/17 209 lb (94.8 kg)  07/21/17 213 lb (96.6 kg)      Other studies Reviewed: Additional studies/ records that were reviewed today include: . Review of the above records demonstrates:    ASSESSMENT AND PLAN:  1. Congestive heart failure-chronic systolic congestive heart failure Going great , no chf symptoms    2. RBBB-  Stable   3. Mild coronary artery disease- no angina   4. Hyperlipidemia  - continue atorva Labs today    Current medicines are reviewed at length with the patient today.  The patient does not have concerns regarding medicines.  The following changes have been made:  no change  Disposition:   FU with APP in 6 months,  Will see me in 1 year    Signed, Mertie Moores, MD  01/28/2018 8:46 AM    [facility] [place], [place], [place]  [address] Phone: [phone]; Fax: [phone]

## 2018-05-22 ENCOUNTER — Other Ambulatory Visit: Payer: Self-pay | Admitting: Cardiovascular Disease

## 2018-05-27 ENCOUNTER — Other Ambulatory Visit: Payer: Self-pay | Admitting: Cardiovascular Disease

## 2018-07-29 ENCOUNTER — Encounter: Payer: Self-pay | Admitting: Cardiovascular Disease

## 2018-07-29 ENCOUNTER — Ambulatory Visit: Payer: Medicare HMO | Admitting: Cardiovascular Disease

## 2018-07-29 ENCOUNTER — Encounter (INDEPENDENT_AMBULATORY_CARE_PROVIDER_SITE_OTHER): Payer: Self-pay

## 2018-07-29 VITALS — BP 108/62 | HR 60 | Ht 72.0 in | Wt 209.4 lb

## 2018-07-29 DIAGNOSIS — I2510 Atherosclerotic heart disease of native coronary artery without angina pectoris: Secondary | ICD-10-CM | POA: Diagnosis not present

## 2018-07-29 DIAGNOSIS — I5022 Chronic systolic (congestive) heart failure: Secondary | ICD-10-CM | POA: Diagnosis not present

## 2018-07-29 LAB — HEPATIC FUNCTION PANEL
ALBUMIN: 3.9 g/dL (ref 3.5–4.7)
ALK PHOS: 60 IU/L (ref 39–117)
ALT: 11 IU/L (ref 0–44)
AST: 13 IU/L (ref 0–40)
BILIRUBIN, DIRECT: 0.23 mg/dL (ref 0.00–0.40)
Bilirubin Total: 0.8 mg/dL (ref 0.0–1.2)
TOTAL PROTEIN: 6 g/dL (ref 6.0–8.5)

## 2018-07-29 LAB — BASIC METABOLIC PANEL
BUN / CREAT RATIO: 26 — AB (ref 10–24)
BUN: 31 mg/dL — ABNORMAL HIGH (ref 8–27)
CHLORIDE: 100 mmol/L (ref 96–106)
CO2: 23 mmol/L (ref 20–29)
Calcium: 9.1 mg/dL (ref 8.6–10.2)
Creatinine, Ser: 1.2 mg/dL (ref 0.76–1.27)
GFR, EST AFRICAN AMERICAN: 65 mL/min/{1.73_m2} (ref >59)
GFR, EST NON AFRICAN AMERICAN: 56 mL/min/{1.73_m2} — AB (ref >59)
Glucose: 102 mg/dL — ABNORMAL HIGH (ref 65–99)
POTASSIUM: 4.4 mmol/L (ref 3.5–5.2)
Sodium: 140 mmol/L (ref 134–144)

## 2018-07-29 LAB — LIPID PANEL
CHOL/HDL RATIO: 3.3 ratio (ref 0.0–5.0)
Cholesterol, Total: 117 mg/dL (ref 100–199)
HDL: 35 mg/dL — ABNORMAL LOW (ref >39)
LDL Calculated: 66 mg/dL (ref 0–99)
Triglycerides: 80 mg/dL (ref 0–149)
VLDL Cholesterol Cal: 16 mg/dL (ref 5–40)

## 2018-07-29 NOTE — Patient Instructions (Signed)
Medication Instructions:  Your provider recommends that you continue on your current medications as directed. Please refer to the Current Medication list given to you today.    Labwork: Labs: TODAY!  Testing/Procedures: None  Follow-Up: Your provider wants you to follow-up in: 6 months with Dr. Acie Fredrickson. You will receive a reminder letter in the mail two months in advance. If you don't receive a letter, please call our office to schedule the follow-up appointment.    Any Other Special Instructions Will Be Listed Below (If Applicable).     If you need a refill on your cardiac medications before your next appointment, please call your pharmacy.

## 2018-07-29 NOTE — Progress Notes (Signed)
Cardiology Office Note   Date:  07/29/2018   ID:  ELISE GLADDEN, DOB [birth year], MRN [id number]  PCP:  Christain Sacramento, MD  Cardiologist: Mertie Moores, MD   Chief Complaint  Patient presents with  . Coronary Artery Disease   1. Congestive heart failure-chronic systolic congestive heart failure Left ventricle: The cavity size was moderately to severely dilated. Wall thickness was normal. Systolic function was normal. The estimated ejection fraction was in the range of 50% to 55%. Wall motion was normal; there were no regional wall motion abnormalities. There was an increased relative contribution of atrial contraction to ventricular filling. - Aortic valve: Mild regurgitation. - Left atrium: The atrium was mildly dilated. - Right ventricle: The cavity size was moderately dilated. Systolic function was mildly reduced. - Right atrium: The atrium was mildly dilated 2. RBBB 3. Mild coronary artery disease- The left anterior descending artery is moderately calcified. There is a mid 40%-50% stenosis right at the takeoff of the first diagonal artery. D1 - moderate stenosis.  The left circumflex artery is large and is dominant.  80-90% PDA stenosis  The ramus intermediate vessel has minor luminal irregularities. The right coronary artery is small, it is nondominant.  LV gram -  EF  35-40%. There is hypokinesis of the anterior wall, apex, and inferoapical wall. The aortic root appears to be moderately dilated.  4. Hyperlipidemia   84 yo gentleman with a hx of mild CHF. He walks occasionally. He has been exercising on a regular basis. He works out in his garden and walks on his treadmill in the evenings. He's not had any significant episodes of chest pain or shortness breath.   Feb. 6, 2014  Chinedu is doing well since I last saw him. He's not had any episodes of chest pain or shortness breath. He's exercising on a regular basis.  March 10, 2013:  Tristin is doing well. He's not had any episodes of chest pain or shortness breath. Playing golf, fishing, gardening, boating this past summer. Tries to avoid salt. No dypsnea.   Feb. 24, 2015:  Pt is doing OK. No CP. He has some abdominal issues - has 2 hernias. Will be seen the surgeons today.  Sept. 3, 2015:  Amandeep is doing well. Had his hernias repaired No CP, no dyspnea. Staying active. Walks regularly.    September 15, 2014:   GIANKARLO LEAMER is a 84 y.o. male who presents for follow up of his CHF Occasional DOE, no CP.    Aug. 30, 2016:  Doing well.  Has some allergies . No CP or dyspnea.  Works out in the garden   Feb. 28, 2017:  Doing well.  Working in his garden   Oct. 17, 2017: Has had a rough summer. Injured his shoulders working on his floating dock this summer.  Now he is having aches all over , thinks its the atorvastatin  Lots of leg and feet swelling   Jan. 11, 2019:   history of chronic systolic congestive heart failure.  His last echocardiogram reveals normal left ventricular systolic function. He still has some occasional shortness of breath with exertion. He does not exercise quite as much as he used to.  Is raising his great grandchildren .  January 28, 2018: Doing ok Raising 2 of his great grandchildren - 2 teenage girls. No CP,  Some Doe, Raising his garden     Jan.. 13, 2020 No CP ,  doingwell Breathing is ok Stays busy -  raises her great grandchildren    56  Enjoys her garden .     Past Medical History:  Diagnosis Date  . Allergy   . Arthritis    MINOR ARTHRITIS FEET AND TOES  . Asthma   . Cataract   . CHF (congestive heart failure) ([place]) 11/2009   EF45-50%  . Coronary artery disease   . Hyperlipidemia   . Myocardial infarction ([place])   . Pneumonia    HX OF PNEUMONIA 4 OR 5 YRS AGO  . Rosacea    OF FACE  . Shortness of breath    SOMETIMES SOB OR WHEEZING WITH EXERTION    Past Surgical History:  Procedure  Laterality Date  . APPENDECTOMY     AGE 6  . CARDIAC CATHETERIZATION  11/2009   mild / mod cad,MODERATE TIGHT STENOSIS IN THE DISTAL LEFT CIRCUMFLEX ARTERY  . ELECTROCARDIOGRAM  11/2009   RBBB  . EYE SURGERY     BILATERAL CATARACT EXTRACT EXTRACTION WITH IMPLANTS  . INGUINAL HERNIA REPAIR Bilateral 09/29/2013   Procedure: LAPAROSCOPIC BILATERAL  INGUINAL HERNIA;  Surgeon: [name], MD;  Location: [facility];  Service: General;  Laterality: Bilateral;  . INSERTION OF MESH Bilateral 09/29/2013   Procedure: INSERTION OF MESH;  Surgeon: [name], MD;  Location: [facility];  Service: General;  Laterality: Bilateral;  . last echo 02/2009    . last nuc 2009  12/16/2007   EF 33%. ABNORMAL. 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 US ECHOCARDIOGRAPHY  02/23/2009   EF 45-50%  . US ECHOCARDIOGRAPHY  12/27/2007   EF 45-50%     Current Outpatient Medications  Medication Sig Dispense Refill  . acetaminophen (TYLENOL) 500 MG tablet Take 1,000 mg by mouth every 6 (six) hours as needed for moderate pain.    Marland Kitchen albuterol (PROVENTIL HFA;VENTOLIN HFA) 108 (90 BASE) MCG/ACT inhaler Inhale 1 puff into the lungs every 6 (six) hours as needed for wheezing.     Marland Kitchen aspirin EC 81 MG tablet Take 81 mg by mouth every evening.    Marland Kitchen atorvastatin (LIPITOR) 40 MG tablet TAKE 1 TABLET AT BEDTIME 90 tablet 1  . carvedilol (COREG) 12.5 MG tablet TAKE 1 TABLET TWICE DAILY WITH MEALS 180 tablet 1  . cetirizine (ZYRTEC) 10 MG tablet Take 10 mg by mouth at bedtime.     . cholecalciferol (VITAMIN D) 1000 UNITS tablet Take 1,000 Units by mouth daily.    . fish oil-omega-3 fatty acids 1000 MG capsule Take 1 g by mouth 2 (two) times daily.     . fluticasone (FLONASE) 50 MCG/ACT nasal spray Place 1-2 sprays into both nostrils daily.    . furosemide (LASIX) 40 MG tablet TAKE 1 TABLET BY MOUTH DAILY. 90 tablet 2  . GLUCOSAMINE PO Take 1 tablet by mouth 3 (three) times daily.     Marland Kitchen  lisinopril (PRINIVIL,ZESTRIL) 5 MG tablet TAKE 1 TABLET (5 MG TOTAL) BY MOUTH DAILY. 90 tablet 3  . metroNIDAZOLE (METROCREAM) 0.75 % cream Apply 1 application topically 2 (two) times daily.     . minocycline (DYNACIN) 50 MG tablet Take 50 mg by mouth 2 (two) times daily.    . nitroGLYCERIN (NITROSTAT) 0.4 MG SL tablet Place 1 tablet (0.4 mg total) under the tongue every 5 (five) minutes as needed for chest pain. 50 tablet 4  . Polyethyl Glycol-Propyl Glycol (SYSTANE) 0.4-0.3 % SOLN Place 1 drop into both eyes 2 (two) times daily  as needed (dry eyes).    . potassium chloride SA (K-DUR,KLOR-CON) 20 MEQ tablet TAKE 1 TABLET (20 MEQ TOTAL) BY MOUTH DAILY. 90 tablet 3  . predniSONE (DELTASONE) 5 MG tablet TAKE 5 MG TABLET BY MOUTH IN THE MORNING AND 1 MG TABLET BY MOUTH AT BEDTIME    . sodium chloride (OCEAN) 0.65 % nasal spray Place 1 spray into the nose as needed for congestion.     No current facility-administered medications for this visit.     Allergies:   Niaspan [niacin]    Social History:  The patient  reports that he has never smoked. He has never used smokeless tobacco. He reports that he does not drink alcohol or use drugs.   Family History:  The patient's family history includes Diabetes in his sister.    ROS:   Noted in current hx.  Otherwise negative.   Physical Exam: Blood pressure 108/62, pulse 60, height 6' (1.829 m), weight 209 lb 6.4 oz (95 kg), SpO2 96 %.  GEN:  Well nourished, well developed in no acute distress HEENT: Normal NECK: No JVD; No carotid bruits LYMPHATICS: No lymphadenopathy CARDIAC: RRR  RESPIRATORY:  Clear to auscultation without rales, wheezing or rhonchi  ABDOMEN: Soft, non-tender, non-distended MUSCULOSKELETAL:  No edema; No deformity  SKIN: Warm and dry NEUROLOGIC:  Alert and oriented x 3     EKG:           Recent Labs: 01/28/2018: ALT 9; BUN 20; Creatinine, Ser 1.18; Potassium 4.5; Sodium 135    Lipid Panel    Component Value  Date/Time   CHOL 109 01/28/2018 0903   TRIG 125 01/28/2018 0903   HDL 32 (L) 01/28/2018 0903   CHOLHDL 3.4 01/28/2018 0903   CHOLHDL 3.7 05/02/2016 0856   VLDL 15 05/02/2016 0856   LDLCALC 52 01/28/2018 0903      Wt Readings from Last 3 Encounters:  07/29/18 209 lb 6.4 oz (95 kg)  01/28/18 212 lb 8 oz (96.4 kg)  07/27/17 209 lb (94.8 kg)      Other studies Reviewed: Additional studies/ records that were reviewed today include: . Review of the above records demonstrates:    ASSESSMENT AND PLAN:  1. Congestive heart failure-chronic systolic congestive heart failure  stable ,  Watches his diet    2. RBBB-     3. Mild coronary artery disease-  No angina   4. Hyperlipidemia  - continue atorva Check lipid profile, liver enzymes, basic metabolic profile today.   Current medicines are reviewed at length with the patient today.  The patient does not have concerns regarding medicines.  The following changes have been made:  no change  Disposition:   FU with APP in 6 months,  Will see me in 1 year    Signed, Mertie Moores, MD  07/29/2018 8:36 AM    [facility] [address], [place], [place]  [address] Phone: [phone]; Fax: [phone]

## 2018-10-07 ENCOUNTER — Other Ambulatory Visit: Payer: Self-pay | Admitting: Cardiovascular Disease

## 2018-11-15 ENCOUNTER — Other Ambulatory Visit: Payer: Self-pay | Admitting: Cardiovascular Disease

## 2018-11-29 ENCOUNTER — Other Ambulatory Visit: Payer: Self-pay | Admitting: Cardiovascular Disease

## 2018-12-02 ENCOUNTER — Other Ambulatory Visit: Payer: Self-pay | Admitting: Cardiovascular Disease

## 2018-12-12 ENCOUNTER — Emergency Department (HOSPITAL_COMMUNITY): Payer: Medicare HMO

## 2018-12-12 ENCOUNTER — Other Ambulatory Visit: Payer: Self-pay

## 2018-12-12 ENCOUNTER — Emergency Department (HOSPITAL_COMMUNITY)
Admission: EM | Admit: 2018-12-12 | Discharge: 2018-12-12 | Disposition: A | Discharge status: Home / Self Care | Payer: Medicare HMO | Attending: Emergency Medicine | Admitting: Emergency Medicine

## 2018-12-12 ENCOUNTER — Encounter (HOSPITAL_COMMUNITY): Payer: Self-pay | Admitting: Emergency Medicine

## 2018-12-12 DIAGNOSIS — Z79899 Other long term (current) drug therapy: Secondary | ICD-10-CM | POA: Diagnosis not present

## 2018-12-12 DIAGNOSIS — Y998 Other external cause status: Secondary | ICD-10-CM | POA: Insufficient documentation

## 2018-12-12 DIAGNOSIS — I509 Heart failure, unspecified: Secondary | ICD-10-CM | POA: Diagnosis not present

## 2018-12-12 DIAGNOSIS — Y9301 Activity, walking, marching and hiking: Secondary | ICD-10-CM | POA: Insufficient documentation

## 2018-12-12 DIAGNOSIS — E785 Hyperlipidemia, unspecified: Secondary | ICD-10-CM | POA: Diagnosis not present

## 2018-12-12 DIAGNOSIS — M25552 Pain in left hip: Secondary | ICD-10-CM

## 2018-12-12 DIAGNOSIS — W010XXA Fall on same level from slipping, tripping and stumbling without subsequent striking against object, initial encounter: Secondary | ICD-10-CM | POA: Diagnosis not present

## 2018-12-12 DIAGNOSIS — I2510 Atherosclerotic heart disease of native coronary artery without angina pectoris: Secondary | ICD-10-CM | POA: Diagnosis not present

## 2018-12-12 DIAGNOSIS — S79912A Unspecified injury of left hip, initial encounter: Secondary | ICD-10-CM | POA: Insufficient documentation

## 2018-12-12 DIAGNOSIS — Y92017 Garden or yard in single-family (private) house as the place of occurrence of the external cause: Secondary | ICD-10-CM | POA: Diagnosis not present

## 2018-12-12 DIAGNOSIS — W19XXXA Unspecified fall, initial encounter: Secondary | ICD-10-CM

## 2018-12-12 NOTE — ED Triage Notes (Signed)
Patient fell while walking across the yard at 1530 today. Denies loss of consciousness. Patient complains of left hip pain with swelling

## 2018-12-12 NOTE — Discharge Instructions (Addendum)
CT scan showed no obvious broken hip.  You will be sore for several days.  Ice.  Tylenol.

## 2018-12-12 NOTE — ED Provider Notes (Signed)
[facility] EMERGENCY DEPARTMENT Provider Note   CSN: [id number] Arrival date & time: 12/12/18  1656    History   Chief Complaint Chief Complaint  Patient presents with  . Fall    HPI Jeffrey Stewart is a 84 y.o. male.     Status post fall on slippery grass this afternoon at home.  Struck left posterior lateral hip.  No other obvious trauma.  He is able to walk, but it hurts.  Severity is moderate.  No head or neck injury     Past Medical History:  Diagnosis Date  . Allergy   . Arthritis    MINOR ARTHRITIS FEET AND TOES  . Asthma   . Cataract   . CHF (congestive heart failure) ([place]) 11/2009   EF45-50%  . Coronary artery disease   . Hyperlipidemia   . Myocardial infarction ([place])   . Pneumonia    HX OF PNEUMONIA 4 OR 5 YRS AGO  . Rosacea    OF FACE  . Shortness of breath    SOMETIMES SOB OR WHEEZING WITH EXERTION    Patient Active Problem List   Diagnosis Date Noted  . Bilateral inguinal hernia 09/19/2013  . RBBB (right bundle branch block) 05/08/2011  . CAD (coronary artery disease) 10/27/2010  . CHF (congestive heart failure) ([place]) 10/27/2010  . Hyperlipidemia 10/27/2010    Past Surgical History:  Procedure Laterality Date  . APPENDECTOMY     AGE 64  . CARDIAC CATHETERIZATION  11/2009   mild / mod cad,MODERATE TIGHT STENOSIS IN THE DISTAL LEFT CIRCUMFLEX ARTERY  . ELECTROCARDIOGRAM  11/2009   RBBB  . EYE SURGERY     BILATERAL CATARACT EXTRACT EXTRACTION WITH IMPLANTS  . INGUINAL HERNIA REPAIR Bilateral 09/29/2013   Procedure: LAPAROSCOPIC BILATERAL  INGUINAL HERNIA;  Surgeon: [name], MD;  Location: [facility];  Service: General;  Laterality: Bilateral;  . INSERTION OF MESH Bilateral 09/29/2013   Procedure: INSERTION OF MESH;  Surgeon: [name], MD;  Location: [facility];  Service: General;  Laterality: Bilateral;  . last echo 02/2009    . last nuc 2009  12/16/2007   EF 33%. ABNORMAL. HE HAS APICAL DEFECT CONSISTENT WITH A SCAR.LV FUNCTION  MODERATELY  DEPRESSED  . US ECHOCARDIOGRAPHY  02/23/2009   EF 45-50%  . US ECHOCARDIOGRAPHY  12/27/2007   EF 45-50%        Home Medications    Prior to Admission medications   Medication Sig Start Date End Date Taking? Authorizing Provider  acetaminophen (TYLENOL) 500 MG tablet Take 1,000 mg by mouth every 6 (six) hours as needed for moderate pain.    [provider]  albuterol (PROVENTIL HFA;VENTOLIN HFA) 108 (90 BASE) MCG/ACT inhaler Inhale 1 puff into the lungs every 6 (six) hours as needed for wheezing.     [provider]  aspirin EC 81 MG tablet Take 81 mg by mouth every evening.    [provider]  atorvastatin (LIPITOR) 40 MG tablet TAKE 1 TABLET AT BEDTIME 12/04/18   Nahser, Wonda Cheng, MD  carvedilol (COREG) 12.5 MG tablet TAKE 1 TABLET TWICE DAILY WITH MEALS 12/04/18   Nahser, Wonda Cheng, MD  cetirizine (ZYRTEC) 10 MG tablet Take 10 mg by mouth at bedtime.     [provider]  cholecalciferol (VITAMIN D) 1000 UNITS tablet Take 1,000 Units by mouth daily.    [provider]  fish oil-omega-3 fatty acids 1000 MG capsule Take 1 g by mouth 2 (two) times  daily.     [provider]  fluticasone (FLONASE) 50 MCG/ACT nasal spray Place 1-2 sprays into both nostrils daily.    [provider]  furosemide (LASIX) 40 MG tablet TAKE 1 TABLET EVERY DAY 11/29/18   Nahser, Wonda Cheng, MD  GLUCOSAMINE PO Take 1 tablet by mouth 3 (three) times daily.     [provider]  lisinopril (PRINIVIL,ZESTRIL) 5 MG tablet TAKE 1 TABLET (5 MG TOTAL) BY MOUTH DAILY. 11/28/17   Nahser, Wonda Cheng, MD  metroNIDAZOLE (METROCREAM) 0.75 % cream Apply 1 application topically 2 (two) times daily.     [provider]  minocycline (DYNACIN) 50 MG tablet Take 50 mg by mouth 2 (two) times daily.    [provider]  nitroGLYCERIN (NITROSTAT) 0.4 MG SL tablet Place 1 tablet (0.4 mg total) under the tongue every 5 (five) minutes as needed for  chest pain. 09/14/15   Nahser, Wonda Cheng, MD  Polyethyl Glycol-Propyl Glycol (SYSTANE) 0.4-0.3 % SOLN Place 1 drop into both eyes 2 (two) times daily as needed (dry eyes).    [provider]  potassium chloride SA (K-DUR) 20 MEQ tablet TAKE 1 TABLET BY MOUTH DAILY. 11/15/18   Nahser, Wonda Cheng, MD  predniSONE (DELTASONE) 5 MG tablet TAKE 5 MG TABLET BY MOUTH IN THE MORNING AND 1 MG TABLET BY MOUTH AT BEDTIME    [provider]  sodium chloride (OCEAN) 0.65 % nasal spray Place 1 spray into the nose as needed for congestion.    [provider]    Family History Family History  Problem Relation Age of Onset  . Diabetes Sister     Social History Social History   Tobacco Use  . Smoking status: Never Smoker  . Smokeless tobacco: Never Used  Substance Use Topics  . Alcohol use: No  . Drug use: No     Allergies   Niaspan [niacin]   Review of Systems Review of Systems  All other systems reviewed and are negative.    Physical Exam Updated Vital Signs BP 107/71   Pulse 91   Temp 98.3 F (36.8 C) (Oral)   Resp (!) 22   Ht 6' (1.829 m)   Wt 95.3 kg   SpO2 99%   BMI 28.48 kg/m   Physical Exam Vitals signs and nursing note reviewed.  Constitutional:      Appearance: He is well-developed.  HENT:     Head: Normocephalic and atraumatic.  Eyes:     Conjunctiva/sclera: Conjunctivae normal.  Neck:     Musculoskeletal: Neck supple.  Cardiovascular:     Rate and Rhythm: Normal rate and regular rhythm.  Pulmonary:     Effort: Pulmonary effort is normal.     Breath sounds: Normal breath sounds.  Abdominal:     General: Bowel sounds are normal.     Palpations: Abdomen is soft.  Musculoskeletal:     Comments: Tender left posterior lateral hip  Skin:    General: Skin is warm and dry.  Neurological:     Mental Status: He is alert and oriented to person, place, and time.  Psychiatric:        Behavior: Behavior normal.      ED Treatments / Results   Labs (all labs ordered are listed, but only abnormal results are displayed) Labs Reviewed - No data to display  EKG None  Radiology Dg Hip Unilat With Pelvis 2-3 Views Left  Result Date: 12/12/2018 CLINICAL DATA:  Left hip pain since  a fall at 3:30 p.m. today. Initial encounter. EXAM: DG HIP (WITH OR WITHOUT PELVIS) 2-3V LEFT COMPARISON:  Plain films left hip 07/21/2017. FINDINGS: There is no acute bony or joint abnormality. No focal bony lesion. Lower lumbar spondylosis is noted. The patient is status post hernia repair. IMPRESSION: No acute abnormality.  No change compared to the prior study. Electronically Signed   By: Inge Rise M.D.   On: 12/12/2018 18:23    Procedures Procedures (including critical care time)  Medications Ordered in ED Medications - No data to display   Initial Impression / Assessment and Plan / ED Course  I have reviewed the triage vital signs and the nursing notes.  Pertinent labs & imaging results that were available during my care of the patient were reviewed by me and considered in my medical decision making (see chart for details).        Plain films left hip negative.  Will obtain CT hip.  Final Clinical Impressions(s) / ED Diagnoses   Final diagnoses:  Fall, initial encounter  Left hip pain    ED Discharge Orders    None       Nat Christen, MD 12/12/18 2205

## 2018-12-30 ENCOUNTER — Other Ambulatory Visit: Payer: Self-pay

## 2018-12-30 ENCOUNTER — Other Ambulatory Visit: Payer: Self-pay | Admitting: Physician Assistant

## 2018-12-30 ENCOUNTER — Other Ambulatory Visit (HOSPITAL_COMMUNITY): Payer: Self-pay | Admitting: Physician Assistant

## 2018-12-30 ENCOUNTER — Ambulatory Visit
Admission: RE | Admit: 2018-12-30 | Discharge: 2018-12-30 | Disposition: A | Discharge status: Home / Self Care | Payer: Medicare HMO | Source: Ambulatory Visit | Attending: Physician Assistant | Admitting: Physician Assistant

## 2018-12-30 DIAGNOSIS — M25572 Pain in left ankle and joints of left foot: Secondary | ICD-10-CM

## 2018-12-30 DIAGNOSIS — M79672 Pain in left foot: Secondary | ICD-10-CM

## 2018-12-30 DIAGNOSIS — R609 Edema, unspecified: Secondary | ICD-10-CM

## 2018-12-30 DIAGNOSIS — R600 Localized edema: Secondary | ICD-10-CM

## 2019-01-03 ENCOUNTER — Ambulatory Visit (HOSPITAL_COMMUNITY): Payer: Medicare HMO

## 2019-01-07 ENCOUNTER — Ambulatory Visit (HOSPITAL_COMMUNITY): Payer: Medicare HMO

## 2019-01-22 ENCOUNTER — Encounter: Payer: Self-pay | Admitting: Cardiology

## 2019-01-22 ENCOUNTER — Telehealth: Payer: Self-pay | Admitting: Cardiology

## 2019-01-22 NOTE — Telephone Encounter (Signed)

## 2019-01-23 ENCOUNTER — Ambulatory Visit (INDEPENDENT_AMBULATORY_CARE_PROVIDER_SITE_OTHER): Payer: Medicare HMO | Admitting: Cardiology

## 2019-01-23 ENCOUNTER — Other Ambulatory Visit: Payer: Self-pay

## 2019-01-23 ENCOUNTER — Encounter: Payer: Self-pay | Admitting: Cardiology

## 2019-01-23 VITALS — BP 100/54 | HR 74 | Ht 72.0 in | Wt 212.0 lb

## 2019-01-23 DIAGNOSIS — I2510 Atherosclerotic heart disease of native coronary artery without angina pectoris: Secondary | ICD-10-CM | POA: Diagnosis not present

## 2019-01-23 DIAGNOSIS — I5022 Chronic systolic (congestive) heart failure: Secondary | ICD-10-CM

## 2019-01-23 DIAGNOSIS — E782 Mixed hyperlipidemia: Secondary | ICD-10-CM | POA: Diagnosis not present

## 2019-01-23 MED ORDER — NITROGLYCERIN 0.4 MG SL SUBL: 0.4000 mg | SUBLINGUAL_TABLET | SUBLINGUAL | 5 refills | Status: DC | PRN

## 2019-01-23 NOTE — Progress Notes (Signed)
Cardiology Office Note   Date:  01/23/2019   ID:  Jeffrey Stewart, DOB 06/22/1935, MRN [id number]  PCP:  Christain Sacramento, MD  Cardiologist:  Mertie Moores, MD EP: None  Chief Complaint  Patient presents with  . Follow-up    CHF and CAD      History of Present Illness: Jeffrey Stewart is a 84 y.o. male with a PMH of chronic combined CHF, CAD (no stent placement), HLD, and RBBB who presents for routine follow-up of CHF and CAD.   He was last evaluated by cardiology at an outpatient visit with Dr. Acie Fredrickson 07/29/2018, at which time he was doing well from a cardiac standpoint. No medication changes occurred and he was recommended to follow-up with an APP in 6 months. Last ischemic evaluation was a cath in 2011 for chest pain showed 40-45% dig, and 80-90% distal Lcx. Unchanged from prior cath in 2009. Treated medically. EF was 30-35%. Last echocardiogram in 2015 showed EF 50-55%, moderately to severely dilated LV, mild AI, and moderately dilated RV.   He presents today for routine follow-up of his CHF and CAD. He had a mechanical fall in his yard in May and has had trouble with LLE pain and swelling since that time. Thankfully he did not break anything and has been following outpatient with ortho. He reports being instructed to take ASA 325mg  BID to prevent blood clots and wonders when he can resume baby aspirin dosing. Recommended that he speak with ortho since they were the ones to make that change. From a cardiac standpoint he feels like he is doing well. No complaints of chest pain or SOB. Prior to his fall in May he was still mowing his yard and tending to his garden without anginal complaints. LLE edema has increased since fall but RLE edema is stable. No complaints of dizziness, lightheadedness, syncope, orthopnea, or PND.    Past Medical History:  Diagnosis Date  . Allergy   . Arthritis    MINOR ARTHRITIS FEET AND TOES  . Asthma   . Cataract   . CHF (congestive heart failure) ([place])  11/2009   EF45-50%  . Coronary artery disease   . Hyperlipidemia   . Myocardial infarction ([place])   . Pneumonia    HX OF PNEUMONIA 4 OR 5 YRS AGO  . Rosacea    OF FACE  . Shortness of breath    SOMETIMES SOB OR WHEEZING WITH EXERTION    Past Surgical History:  Procedure Laterality Date  . APPENDECTOMY     AGE 24  . CARDIAC CATHETERIZATION  11/2009   mild / mod cad,MODERATE TIGHT STENOSIS IN THE DISTAL LEFT CIRCUMFLEX ARTERY  . ELECTROCARDIOGRAM  11/2009   RBBB  . EYE SURGERY     BILATERAL CATARACT EXTRACT EXTRACTION WITH IMPLANTS  . INGUINAL HERNIA REPAIR Bilateral 09/29/2013   Procedure: LAPAROSCOPIC BILATERAL  INGUINAL HERNIA;  Surgeon: [name], MD;  Location: [facility];  Service: General;  Laterality: Bilateral;  . INSERTION OF MESH Bilateral 09/29/2013   Procedure: INSERTION OF MESH;  Surgeon: [name], MD;  Location: [facility];  Service: General;  Laterality: Bilateral;  . last echo 02/2009    . last nuc 2009  12/16/2007   EF 33%. ABNORMAL. 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 US ECHOCARDIOGRAPHY  02/23/2009   EF 45-50%  . US ECHOCARDIOGRAPHY  12/27/2007   EF 45-50%     Current Outpatient Medications  Medication Sig Dispense Refill  . acetaminophen (TYLENOL) 500 MG tablet Take 1,000 mg by mouth every 6 (six) hours as needed for moderate pain.    Marland Kitchen albuterol (PROVENTIL HFA;VENTOLIN HFA) 108 (90 BASE) MCG/ACT inhaler Inhale 1 puff into the lungs every 6 (six) hours as needed for wheezing.     Marland Kitchen aspirin EC 81 MG tablet Take 81 mg by mouth every evening.    Marland Kitchen atorvastatin (LIPITOR) 40 MG tablet TAKE 1 TABLET AT BEDTIME 90 tablet 2  . carvedilol (COREG) 12.5 MG tablet TAKE 1 TABLET TWICE DAILY WITH MEALS 180 tablet 2  . cetirizine (ZYRTEC) 10 MG tablet Take 10 mg by mouth at bedtime.     . cholecalciferol (VITAMIN D) 1000 UNITS tablet Take 1,000 Units by mouth daily.    . fish oil-omega-3 fatty acids 1000 MG capsule Take 1 g by  mouth 2 (two) times daily.     . fluticasone (FLONASE) 50 MCG/ACT nasal spray Place 1-2 sprays into both nostrils daily.    . furosemide (LASIX) 40 MG tablet TAKE 1 TABLET EVERY DAY 90 tablet 1  . GLUCOSAMINE PO Take 1 tablet by mouth 3 (three) times daily.     Marland Kitchen glucosamine-chondroitin 500-400 MG tablet Take 1 tablet by mouth daily.    Marland Kitchen lisinopril (PRINIVIL,ZESTRIL) 5 MG tablet TAKE 1 TABLET (5 MG TOTAL) BY MOUTH DAILY. 90 tablet 3  . metroNIDAZOLE (METROCREAM) 0.75 % cream Apply 1 application topically 2 (two) times daily.     . minocycline (DYNACIN) 50 MG tablet Take 50 mg by mouth 2 (two) times daily.    . nitroGLYCERIN (NITROSTAT) 0.4 MG SL tablet Place 1 tablet (0.4 mg total) under the tongue every 5 (five) minutes as needed for chest pain. 25 tablet 5  . Polyethyl Glycol-Propyl Glycol (SYSTANE) 0.4-0.3 % SOLN Place 1 drop into both eyes 2 (two) times daily as needed (dry eyes).    . potassium chloride SA (K-DUR) 20 MEQ tablet TAKE 1 TABLET BY MOUTH DAILY. 90 tablet 2  . predniSONE (DELTASONE) 5 MG tablet TAKE 5 MG TABLET BY MOUTH IN THE MORNING AND 1 MG TABLET BY MOUTH AT BEDTIME    . sodium chloride (OCEAN) 0.65 % nasal spray Place 1 spray into the nose as needed for congestion.     No current facility-administered medications for this visit.     Allergies:   Niaspan [niacin]    Social History:  The patient  reports that he has never smoked. He has never used smokeless tobacco. He reports that he does not drink alcohol or use drugs.   Family History:  The patient's family history includes Diabetes in his sister.    ROS:  Please see the history of present illness.   Otherwise, review of systems are positive for none.   All other systems are reviewed and negative.    PHYSICAL EXAM: VS:  BP (!) 100/54   Pulse 74   Ht 6' (1.829 m)   Wt 212 lb (96.2 kg)   SpO2 97%   BMI 28.75 kg/m  , BMI Body mass index is 28.75 kg/m. GEN: Well nourished, well developed, in no acute distress  HEENT: sclera anicteric Neck: no JVD, carotid bruits, or masses Cardiac: RRR; no murmurs, rubs, or gallops, 1+ LE edema L>R Respiratory:  clear to auscultation bilaterally, normal work of breathing GI: soft, nontender, nondistended, + BS MS: no deformity or atrophy Skin: warm and dry, chronic venous stasis skin changes to b/l LE Neuro:  Strength and sensation are intact Psych: euthymic mood, full affect   EKG:  EKG is not ordered today.    Recent Labs: 07/29/2018: ALT 11; BUN 31; Creatinine, Ser 1.20; Potassium 4.4; Sodium 140    Lipid Panel    Component Value Date/Time   CHOL 117 07/29/2018 0848   TRIG 80 07/29/2018 0848   HDL 35 (L) 07/29/2018 0848   CHOLHDL 3.3 07/29/2018 0848   CHOLHDL 3.7 05/02/2016 0856   VLDL 15 05/02/2016 0856   LDLCALC 66 07/29/2018 0848      Wt Readings from Last 3 Encounters:  01/23/19 212 lb (96.2 kg)  12/12/18 210 lb (95.3 kg)  07/29/18 209 lb 6.4 oz (95 kg)      Other studies Reviewed: Additional studies/ records that were reviewed today include:   Echocardiogram 2015: Study Conclusions   - Left ventricle: The cavity size was moderately to severely  dilated. Wall thickness was normal. Systolic function was  normal. The estimated ejection fraction was in the range  of 50% to 55%. Wall motion was normal; there were no  regional wall motion abnormalities. There was an increased  relative contribution of atrial contraction to ventricular  filling.  - Aortic valve: Mild regurgitation.  - Left atrium: The atrium was mildly dilated.  - Right ventricle: The cavity size was moderately dilated.  Systolic function was mildly reduced.  - Right atrium: The atrium was mildly dilated.     ASSESSMENT AND PLAN:  1. Chronic combined CHF: Noted to have EF 30-35% on LHC in 2011. Most recent echo in 2015 with EF 50-55%. He has chronic LE edema which was exacerbated by his fall in May, now with increased LLE edema. No complaints of  SOB, orthopnea, or PND.  - Continue carvedilol, lisinopril, and lasix - Suspect LLE edema will continue to improve with resolution of sprain/strain to hip and foot. We discussed importance of elevation and compression to help with swelling.   2. CAD: noted to have moderate diagonal stenosis and severe dLCx disease on LHC in 2011 which is medically managed. No anginal complaints. - Continue aspirin and statin - SL nitro Rx updated and administration instructions reviewed.  3. HLD: LDL 66 on FLP 07/2018. - Continue statin  4. Recent fall: suffered a mechanical fall 11/2018. No fractures but still with ongoing pain and swelling. Following outpatient with ortho - Continue elevation and compression - Patient to discuss with ortho when he can resume baby aspirin dosing - he reports currently being on 325mg  BID   Current medicines are reviewed at length with the patient today.  The patient does not have concerns regarding medicines.  The following changes have been made:  no change  Labs/ tests ordered today include:  No orders of the defined types were placed in this encounter.    Disposition:   FU with Dr. Acie Fredrickson in 6 months  Signed, Abigail Butts, PA-C  01/23/2019 4:19 PM

## 2019-01-23 NOTE — Patient Instructions (Signed)
Medication Instructions:  Your physician recommends that you continue on your current medications as directed. Please refer to the Current Medication list given to you today.  If you need a refill on your cardiac medications before your next appointment, please call your pharmacy.   Lab work: NONE If you have labs (blood work) drawn today and your tests are completely normal, you will receive your results only by: Marland Kitchen MyChart Message (if you have MyChart) OR . A paper copy in the mail If you have any lab test that is abnormal or we need to change your treatment, we will call you to review the results.  Testing/Procedures: NONE  Follow-Up: At [facility], you and your health needs are our priority.  As part of our continuing mission to provide you with exceptional heart care, we have created designated Provider Care Teams.  These Care Teams include your primary Cardiologist (physician) and Advanced Practice Providers (APPs -  Physician Assistants and Nurse Practitioners) who all work together to provide you with the care you need, when you need it. You will need a follow up appointment in:  6 months.  Please call our office 2 months in advance to schedule this appointment.  You may see Mertie Moores, MD or one of the following Advanced Practice Providers on your designated Care Team: Richardson Dopp, PA-C [place], [place] . Daune Perch, NP  Any Other Special Instructions Will Be Listed Below (If Applicable).

## 2019-05-06 ENCOUNTER — Other Ambulatory Visit: Payer: Self-pay | Admitting: Cardiovascular Disease

## 2019-06-10 ENCOUNTER — Other Ambulatory Visit: Payer: Self-pay | Admitting: Cardiovascular Disease

## 2019-07-17 ENCOUNTER — Other Ambulatory Visit: Payer: Self-pay | Admitting: Cardiovascular Disease

## 2019-08-01 ENCOUNTER — Encounter: Payer: Self-pay | Admitting: Cardiovascular Disease

## 2019-08-01 ENCOUNTER — Other Ambulatory Visit: Payer: Self-pay

## 2019-08-01 ENCOUNTER — Ambulatory Visit: Payer: Medicare HMO | Admitting: Cardiovascular Disease

## 2019-08-01 VITALS — BP 98/62 | HR 63 | Ht 72.0 in | Wt 213.4 lb

## 2019-08-01 DIAGNOSIS — I5022 Chronic systolic (congestive) heart failure: Secondary | ICD-10-CM | POA: Diagnosis not present

## 2019-08-01 DIAGNOSIS — I4510 Unspecified right bundle-branch block: Secondary | ICD-10-CM

## 2019-08-01 DIAGNOSIS — E782 Mixed hyperlipidemia: Secondary | ICD-10-CM

## 2019-08-01 DIAGNOSIS — I5040 Unspecified combined systolic (congestive) and diastolic (congestive) heart failure: Secondary | ICD-10-CM

## 2019-08-01 DIAGNOSIS — I2510 Atherosclerotic heart disease of native coronary artery without angina pectoris: Secondary | ICD-10-CM | POA: Diagnosis not present

## 2019-08-01 MED ORDER — FUROSEMIDE 40 MG PO TABS: 40.0000 mg | ORAL_TABLET | ORAL | 3 refills | Status: DC

## 2019-08-01 MED ORDER — POTASSIUM CHLORIDE CRYS ER 20 MEQ PO TBCR: 20.0000 meq | EXTENDED_RELEASE_TABLET | ORAL | 3 refills | Status: DC

## 2019-08-01 NOTE — Progress Notes (Signed)
Cardiology Office Note   Date:  08/01/2019   ID:  Jeffrey Stewart, DOB 09/16/1934, MRN [id number]  PCP:  Christain Sacramento, MD  Cardiologist: Mertie Moores, MD   Chief Complaint  Patient presents with  . Coronary Artery Disease   1. Congestive heart failure-chronic systolic congestive heart failure Left ventricle: The cavity size was moderately to severely dilated. Wall thickness was normal. Systolic function was normal. The estimated ejection fraction was in the range of 50% to 55%. Wall motion was normal; there were no regional wall motion abnormalities. There was an increased relative contribution of atrial contraction to ventricular filling. - Aortic valve: Mild regurgitation. - Left atrium: The atrium was mildly dilated. - Right ventricle: The cavity size was moderately dilated. Systolic function was mildly reduced. - Right atrium: The atrium was mildly dilated 2. RBBB 3. Mild coronary artery disease- The left anterior descending artery is moderately calcified. There is a mid 40%-50% stenosis right at the takeoff of the first diagonal artery. D1 - moderate stenosis.  The left circumflex artery is large and is dominant.  80-90% PDA stenosis  The ramus intermediate vessel has minor luminal irregularities. The right coronary artery is small, it is nondominant.  LV gram -  EF  35-40%. There is hypokinesis of the anterior wall, apex, and inferoapical wall. The aortic root appears to be moderately dilated.  4. Hyperlipidemia   85 yo gentleman with a hx of mild CHF. He walks occasionally. He has been exercising on a regular basis. He works out in his garden and walks on his treadmill in the evenings. He's not had any significant episodes of chest pain or shortness breath.   Feb. 6, 2014  Jeffrey Stewart is doing well since I last saw him. He's not had any episodes of chest pain or shortness breath. He's exercising on a regular basis.  March 10, 2013:  Jeffrey Stewart is doing well. He's not had any episodes of chest pain or shortness breath. Playing golf, fishing, gardening, boating this past summer. Tries to avoid salt. No dypsnea.   Feb. 24, 2015:  Pt is doing OK. No CP. He has some abdominal issues - has 2 hernias. Will be seen the surgeons today.  Sept. 3, 2015:  Jeffrey Stewart is doing well. Had his hernias repaired No CP, no dyspnea. Staying active. Walks regularly.    September 15, 2014:   Jeffrey Stewart is a 85 y.o. male who presents for follow up of his CHF Occasional DOE, no CP.    Aug. 30, 2016:  Doing well.  Has some allergies . No CP or dyspnea.  Works out in the garden   Feb. 28, 2017:  Doing well.  Working in his garden   Oct. 17, 2017: Has had a rough summer. Injured his shoulders working on his floating dock this summer.  Now he is having aches all over , thinks its the atorvastatin  Lots of leg and feet swelling   Jan. 11, 2019:   history of chronic systolic congestive heart failure.  His last echocardiogram reveals normal left ventricular systolic function. He still has some occasional shortness of breath with exertion. He does not exercise quite as much as he used to.  Is raising his great grandchildren .  January 28, 2018: Doing ok Raising 2 of his great grandchildren - 2 teenage girls. No CP,  Some Doe, Raising his garden     Jan.. 13, 2020 No CP ,  doingwell Breathing is ok Stays busy -  raises her great grandchildren    89  Enjoys her garden .    Jan. 15, 2021 Doing well.   bp is  A bit low today  Eating and drinking well  No CP or dyspnea   Past Medical History:  Diagnosis Date  . Allergy   . Arthritis    MINOR ARTHRITIS FEET AND TOES  . Asthma   . Cataract   . CHF (congestive heart failure) ([place]) 11/2009   EF45-50%  . Coronary artery disease   . Hyperlipidemia   . Myocardial infarction ([place])   . Pneumonia    HX OF PNEUMONIA 4 OR 5 YRS AGO  . Rosacea    OF FACE  . Shortness of  breath    SOMETIMES SOB OR WHEEZING WITH EXERTION    Past Surgical History:  Procedure Laterality Date  . APPENDECTOMY     AGE 5  . CARDIAC CATHETERIZATION  11/2009   mild / mod cad,MODERATE TIGHT STENOSIS IN THE DISTAL LEFT CIRCUMFLEX ARTERY  . ELECTROCARDIOGRAM  11/2009   RBBB  . EYE SURGERY     BILATERAL CATARACT EXTRACT EXTRACTION WITH IMPLANTS  . INGUINAL HERNIA REPAIR Bilateral 09/29/2013   Procedure: LAPAROSCOPIC BILATERAL  INGUINAL HERNIA;  Surgeon: [name], MD;  Location: [facility];  Service: General;  Laterality: Bilateral;  . INSERTION OF MESH Bilateral 09/29/2013   Procedure: INSERTION OF MESH;  Surgeon: [name], MD;  Location: [facility];  Service: General;  Laterality: Bilateral;  . last echo 02/2009    . last nuc 2009  12/16/2007   EF 33%. ABNORMAL. 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 US ECHOCARDIOGRAPHY  02/23/2009   EF 45-50%  . US ECHOCARDIOGRAPHY  12/27/2007   EF 45-50%     Current Outpatient Medications  Medication Sig Dispense Refill  . acetaminophen (TYLENOL) 500 MG tablet Take 1,000 mg by mouth every 6 (six) hours as needed for moderate pain.    Marland Kitchen albuterol (PROVENTIL HFA;VENTOLIN HFA) 108 (90 BASE) MCG/ACT inhaler Inhale 1 puff into the lungs every 6 (six) hours as needed for wheezing.     Marland Kitchen aspirin EC 81 MG tablet Take 81 mg by mouth every evening.    Marland Kitchen atorvastatin (LIPITOR) 40 MG tablet TAKE 1 TABLET AT BEDTIME 90 tablet 2  . carvedilol (COREG) 12.5 MG tablet TAKE 1 TABLET TWICE DAILY WITH MEALS 180 tablet 2  . cetirizine (ZYRTEC) 10 MG tablet Take 10 mg by mouth at bedtime.     . cholecalciferol (VITAMIN D) 1000 UNITS tablet Take 1,000 Units by mouth daily.    . fish oil-omega-3 fatty acids 1000 MG capsule Take 1 g by mouth 2 (two) times daily.     . fluticasone (FLONASE) 50 MCG/ACT nasal spray Place 1-2 sprays into both nostrils daily.    . Fluticasone-Salmeterol (ADVAIR) 250-50 MCG/DOSE AEPB Inhale 1  puff into the lungs 2 (two) times daily.    . furosemide (LASIX) 40 MG tablet TAKE 1 TABLET EVERY DAY 90 tablet 2  . glucosamine-chondroitin 500-400 MG tablet Take 1 tablet by mouth daily.    Marland Kitchen lisinopril (PRINIVIL,ZESTRIL) 5 MG tablet TAKE 1 TABLET (5 MG TOTAL) BY MOUTH DAILY. 90 tablet 3  . metroNIDAZOLE (METROCREAM) 0.75 % cream Apply 1 application topically 2 (two) times daily.     . minocycline (DYNACIN) 50 MG tablet Take 50 mg by mouth 2 (two) times daily.    . NEOMYCIN-POLYMYXIN-HYDROCORTISONE (CORTISPORIN) 1 % SOLN  OTIC solution Place 1 drop into both ears as needed.    . nitroGLYCERIN (NITROSTAT) 0.4 MG SL tablet Place 1 tablet (0.4 mg total) under the tongue every 5 (five) minutes as needed for chest pain. 25 tablet 5  . Polyethyl Glycol-Propyl Glycol (SYSTANE) 0.4-0.3 % SOLN Place 1 drop into both eyes 2 (two) times daily as needed (dry eyes).    . potassium chloride SA (KLOR-CON) 20 MEQ tablet TAKE 1 TABLET EVERY DAY 90 tablet 2  . predniSONE (DELTASONE) 5 MG tablet TAKE 5 MG TABLET BY MOUTH IN THE MORNING AND 1 MG TABLET BY MOUTH AT BEDTIME    . sodium chloride (OCEAN) 0.65 % nasal spray Place 1 spray into the nose as needed for congestion.     No current facility-administered medications for this visit.    Allergies:   Niaspan [niacin]    Social History:  The patient  reports that he has never smoked. He has never used smokeless tobacco. He reports that he does not drink alcohol or use drugs.   Family History:  The patient's family history includes Diabetes in his sister.    ROS:   Noted in current hx.  Otherwise negative.   Physical Exam: Blood pressure 98/62, pulse 63, height 6' (1.829 m), weight 213 lb 7.2 oz (96.8 kg), SpO2 98 %.  GEN:  Elderly male  HEENT: Normal NECK: No JVD; No carotid bruits LYMPHATICS: No lymphadenopathy CARDIAC: RRR , no murmurs, rubs, gallops RESPIRATORY:  Clear to auscultation without rales, wheezing or rhonchi  ABDOMEN: Soft, non-tender,  non-distended MUSCULOSKELETAL:  No edema; No deformity  SKIN: Warm and dry NEUROLOGIC:  Alert and oriented x 3   EKG:   August 01, 2019: Normal sinus rhythm at 63.  Right bundle branch block.  Some artifact interference.   Recent Labs: No results found for requested labs within last 8760 hours.    Lipid Panel    Component Value Date/Time   CHOL 117 07/29/2018 0848   TRIG 80 07/29/2018 0848   HDL 35 (L) 07/29/2018 0848   CHOLHDL 3.3 07/29/2018 0848   CHOLHDL 3.7 05/02/2016 0856   VLDL 15 05/02/2016 0856   LDLCALC 66 07/29/2018 0848      Wt Readings from Last 3 Encounters:  08/01/19 213 lb 7.2 oz (96.8 kg)  01/23/19 212 lb (96.2 kg)  12/12/18 210 lb (95.3 kg)      Other studies Reviewed: Additional studies/ records that were reviewed today include: . Review of the above records demonstrates:    ASSESSMENT AND PLAN:  1. Congestive heart failure-chronic systolic congestive heart failure His blood pressure is a little low and he is feeling a bit fatigued.  We will discontinue the lisinopril.  We will reduce the Lasix to 3 times a week-Monday Wednesday and Friday.  His last echocardiogram shows normal LVEF.  If his blood pressure increases we could consider starting Entresto and discontinue the Lasix completely. We will have him see an APP in 3 months to see how he is doing with these medication changes.  I told him not to throw the medicine away as we might need to restart it.   2. RBBB-   stable.  3. Mild coronary artery disease-he denies any episodes of angina.  4. Hyperlipidemia  -   Lipids look good from an. 13, 2021.   Cont atrova   Current medicines are reviewed at length with the patient today.  The patient does not have concerns regarding medicines.  The following  changes have been made:  no change  Disposition:   FU with APP in 3 months.   I will see him in a year - sooner if needed    Signed, Mertie Moores, MD  08/01/2019 8:32 AM    [facility] [address], [place], [place]  [address] Phone: [phone]; Fax: [phone]

## 2019-08-01 NOTE — Patient Instructions (Signed)
Medication Instructions:  Your physician has recommended you make the following change in your medication:  STOP Lisinopril (Zestril) DECREASE Lasix (Furosemide) to 40 mg on 3 days per week - take on Mondays, Wednesdays, and Fridays DECREASE Kdur (Potassium chloride) to 20 mEq on the same days you take the Lasix  *If you need a refill on your cardiac medications before your next appointment, please call your pharmacy*  Lab Work: None Ordered If you have labs (blood work) drawn today and your tests are completely normal, you will receive your results only by: Marland Kitchen MyChart Message (if you have MyChart) OR . A paper copy in the mail If you have any lab test that is abnormal or we need to change your treatment, we will call you to review the results.   Testing/Procedures: None Ordered   Follow-Up: At [facility], you and your health needs are our priority.  As part of our continuing mission to provide you with exceptional heart care, we have created designated Provider Care Teams.  These Care Teams include your primary Cardiologist (physician) and Advanced Practice Providers (APPs -  Physician Assistants and Nurse Practitioners) who all work together to provide you with the care you need, when you need it.  Your next appointment:   3 month(s)  The format for your next appointment:   In Person on Monday April 19 at 1:30 pm  Provider:   Daune Perch, NP

## 2019-08-15 ENCOUNTER — Ambulatory Visit: Payer: Medicare HMO

## 2019-08-26 ENCOUNTER — Ambulatory Visit: Payer: Medicare HMO

## 2019-10-12 NOTE — Progress Notes (Signed)
Cardiology Office Note:    Date:  10/13/2019   ID:  Jeffrey Stewart, DOB 11/04/1934, MRN [id number]  PCP:  Christain Sacramento, MD  Cardiologist:  Mertie Moores, MD   Electrophysiologist:  None   Referring MD: Christain Sacramento, MD   Chief Complaint:  Follow-up (CHF)    Patient Profile:    Jeffrey Stewart is a 85 y.o. male with:   HFrEF w/ return of normal LVF  Non-ischemic cardiomyopathy  EF 35-40 by cath in 2009  Echocardiogram 3/15: EF 50-55   Coronary artery disease   Cath 2009: mLAD 40-50, D1 50, LPDA 80-90 >> med Rx  RBBB  Hyperlipidemia   Prior CV studies: Echocardiogram 09/18/13 EF 50-55, no RWMA, mild AI, mild BAE, mildly reduced RVSF  Cardiac catheterization 12/24/07 LAD mid 40-50; D1 50 LCx irregs; L PDA 80-90 RI minor irregs EF 35-40  History of Present Illness:    Jeffrey Stewart was last seen by Dr. Acie Fredrickson in 07/2019.  His BP was low.  His Lisinopril was DC'd and his Furosemide was reduced to 3 x per week.   He returns for follow-up.  He is here alone.  Since last seen, he has done well.  He has not had any chest discomfort.  He has not had any further dizziness or lightheadedness.  He has not had any syncope, orthopnea.  He may have some increased lower extremity swelling.  However, he is not sure.  He has not had any bleeding issues.  He has chronic shortness of breath without significant change.     Past Medical History:  Diagnosis Date  . Allergy   . Arthritis    MINOR ARTHRITIS FEET AND TOES  . Asthma   . Cataract   . CHF (congestive heart failure) ([place]) 11/2009   EF45-50%  . Coronary artery disease   . Hyperlipidemia   . Myocardial infarction ([place])   . Pneumonia    HX OF PNEUMONIA 4 OR 5 YRS AGO  . Rosacea    OF FACE  . Shortness of breath    SOMETIMES SOB OR WHEEZING WITH EXERTION    Current Medications: Current Meds  Medication Sig  . acetaminophen (TYLENOL) 500 MG tablet Take 1,000 mg by mouth every 6 (six) hours as needed for moderate pain.    Marland Kitchen albuterol (PROVENTIL HFA;VENTOLIN HFA) 108 (90 BASE) MCG/ACT inhaler Inhale 1 puff into the lungs every 6 (six) hours as needed for wheezing.   Marland Kitchen aspirin EC 81 MG tablet Take 81 mg by mouth every evening.  Marland Kitchen atorvastatin (LIPITOR) 40 MG tablet TAKE 1 TABLET AT BEDTIME  . carvedilol (COREG) 12.5 MG tablet TAKE 1 TABLET TWICE DAILY WITH MEALS  . cetirizine (ZYRTEC) 10 MG tablet Take 10 mg by mouth at bedtime.   . cholecalciferol (VITAMIN D) 1000 UNITS tablet Take 1,000 Units by mouth daily.  . fish oil-omega-3 fatty acids 1000 MG capsule Take 1 g by mouth 2 (two) times daily.   . fluticasone (FLONASE) 50 MCG/ACT nasal spray Place 1-2 sprays into both nostrils daily.  . Fluticasone-Salmeterol (ADVAIR) 250-50 MCG/DOSE AEPB Inhale 1 puff into the lungs 2 (two) times daily.  . furosemide (LASIX) 40 MG tablet Take 1 tablet (40 mg total) by mouth 3 (three) times a week. Take on Monday, Wednesday, and Friday  . glucosamine-chondroitin 500-400 MG tablet Take 1 tablet by mouth daily.  . metroNIDAZOLE (METROCREAM) 0.75 % cream Apply 1 application topically 2 (two) times daily.   . minocycline (  DYNACIN) 50 MG tablet Take 50 mg by mouth 2 (two) times daily.  . NEOMYCIN-POLYMYXIN-HYDROCORTISONE (CORTISPORIN) 1 % SOLN OTIC solution Place 1 drop into both ears as needed.  . nitroGLYCERIN (NITROSTAT) 0.4 MG SL tablet Place 1 tablet (0.4 mg total) under the tongue every 5 (five) minutes as needed for chest pain.  Jeffrey Stewart Glycol-Propyl Glycol (SYSTANE) 0.4-0.3 % SOLN Place 1 drop into both eyes 2 (two) times daily as needed (dry eyes).  . potassium chloride SA (KLOR-CON) 20 MEQ tablet Take 1 tablet (20 mEq total) by mouth 3 (three) times a week. Take on Monday, Wednesday and Friday  . predniSONE (DELTASONE) 5 MG tablet TAKE 5 MG TABLET BY MOUTH IN THE MORNING AND 1 MG TABLET BY MOUTH AT BEDTIME  . sodium chloride (OCEAN) 0.65 % nasal spray Place 1 spray into the nose as needed for congestion.     Allergies:    Niaspan [niacin]   Social History   Tobacco Use  . Smoking status: Never Smoker  . Smokeless tobacco: Never Used  Substance Use Topics  . Alcohol use: No  . Drug use: No     Family Hx: The patient's family history includes Diabetes in his sister.  ROS   EKGs/Labs/Other Test Reviewed:    EKG:  EKG is not ordered today.  The ekg ordered today demonstrates n/a  Recent Labs: No results found for requested labs within last 8760 hours.   Recent Lipid Panel Lab Results  Component Value Date/Time   CHOL 117 07/29/2018 08:48 AM   TRIG 80 07/29/2018 08:48 AM   HDL 35 (L) 07/29/2018 08:48 AM   CHOLHDL 3.3 07/29/2018 08:48 AM   CHOLHDL 3.7 05/02/2016 08:56 AM   LDLCALC 66 07/29/2018 08:48 AM    Physical Exam:    VS:  BP (!) 124/58   Pulse 66   Ht 6' (1.829 m)   Wt 214 lb 1.9 oz (97.1 kg)   SpO2 96%   BMI 29.04 kg/m     Wt Readings from Last 3 Encounters:  10/13/19 214 lb 1.9 oz (97.1 kg)  08/01/19 213 lb 7.2 oz (96.8 kg)  01/23/19 212 lb (96.2 kg)     Constitutional:      Appearance: Healthy appearance. Not in distress.  Neck:     Vascular: JVD normal.  Pulmonary:     Breath sounds: No wheezing. No rales.  Cardiovascular:     Normal rate. Regular rhythm. Normal S1. Normal S2.     Murmurs: There is no murmur.  Edema:    Ankle: bilateral trace edema of the ankle. Abdominal:     Palpations: Abdomen is soft. There is no hepatomegaly.  Skin:    General: Skin is warm and dry.  Neurological:     General: No focal deficit present.     Mental Status: Alert and oriented to person, place and time.      ASSESSMENT & PLAN:    1. Chronic heart failure with preserved ejection fraction (HCC) EF has been as low as 35-40% in the past.  This improved to 50-55% in 2015.  He is NYHA II.  Overall, volume status appears stable.  Since previous medication adjustments, his blood pressure is much better.  However, I am not certain he would be able to tolerate Entresto.   Therefore, I recommended resuming lisinopril at 2.5 mg daily.  I have asked him to monitor his weight daily and take Lasix if his weight increases more than 3 pounds in 1  day.  Continue current dose of carvedilol.  Arrange follow-up BMET in 2 weeks.  I can see him back in 3 months.  He knows to contact us if his systolic pressure runs below 100 again.  2. Coronary artery disease involving native coronary artery of native heart without angina pectoris Moderate diffuse disease noted in 2009 at cardiac catheterization.  He is currently doing well without anginal symptoms.  Continue aspirin, atorvastatin.  3. Mixed hyperlipidemia LDL optimal in January 2020.  Continue current dose of atorvastatin.   Dispo:  Return in about 3 months (around 01/13/2020) for Routine Follow Up, w/ Dr. Acie Fredrickson, or Richardson Dopp, PA-C, in person.   Medication Adjustments/Labs and Tests Ordered: Current medicines are reviewed at length with the patient today.  Concerns regarding medicines are outlined above.  Tests Ordered: Orders Placed This Encounter  Procedures  . Basic metabolic panel   Medication Changes: Meds ordered this encounter  Medications  . lisinopril (ZESTRIL) 2.5 MG tablet    Sig: Take 1 tablet (2.5 mg total) by mouth daily.    Dispense:  90 tablet    Refill:  3    Signed, Richardson Dopp, PA-C  10/13/2019 9:25 AM    [facility] [place], [place], [place]  [address] Phone: [phone]; Fax: [phone]

## 2019-10-13 ENCOUNTER — Ambulatory Visit: Payer: Medicare HMO | Admitting: Cardiology

## 2019-10-13 ENCOUNTER — Other Ambulatory Visit: Payer: Self-pay

## 2019-10-13 ENCOUNTER — Encounter: Payer: Self-pay | Admitting: Physician Assistant

## 2019-10-13 ENCOUNTER — Ambulatory Visit: Payer: Medicare HMO | Admitting: Physician Assistant

## 2019-10-13 VITALS — BP 124/58 | HR 66 | Ht 72.0 in | Wt 214.1 lb

## 2019-10-13 DIAGNOSIS — I2510 Atherosclerotic heart disease of native coronary artery without angina pectoris: Secondary | ICD-10-CM | POA: Diagnosis not present

## 2019-10-13 DIAGNOSIS — I5032 Chronic diastolic (congestive) heart failure: Secondary | ICD-10-CM

## 2019-10-13 DIAGNOSIS — E782 Mixed hyperlipidemia: Secondary | ICD-10-CM | POA: Diagnosis not present

## 2019-10-13 MED ORDER — LISINOPRIL 2.5 MG PO TABS: 2.5000 mg | ORAL_TABLET | Freq: Every day | ORAL | 3 refills | Status: DC

## 2019-10-13 NOTE — Patient Instructions (Addendum)
Medication Instructions:   Your physician has recommended you make the following change in your medication:   1) Restart Lisinopril 2.5 mg, you make take 0.5 tablet of your 5 mg until you run out. Let us know when you need a refill.  *If you need a refill on your cardiac medications before your next appointment, please call your pharmacy*  Lab Work:  Your physician recommends that you return for lab work on 10/27/19  If you have labs (blood work) drawn today and your tests are completely normal, you will receive your results only by: Marland Kitchen MyChart Message (if you have MyChart) OR . A paper copy in the mail If you have any lab test that is abnormal or we need to change your treatment, we will call you to review the results.  Testing/Procedures:  None ordered today  Follow-Up: At [facility], you and your health needs are our priority.  As part of our continuing mission to provide you with exceptional heart care, we have created designated Provider Care Teams.  These Care Teams include your primary Cardiologist (physician) and Advanced Practice Providers (APPs -  Physician Assistants and Nurse Practitioners) who all work together to provide you with the care you need, when you need it.  We recommend signing up for the patient portal called "MyChart".  Sign up information is provided on this After Visit Summary.  MyChart is used to connect with patients for Virtual Visits (Telemedicine).  Patients are able to view lab/test results, encounter notes, upcoming appointments, etc.  Non-urgent messages can be sent to your provider as well.   To learn more about what you can do with MyChart, go to [email or web address].    Your next appointment:    On 01/13/20 at 9:15AM with Richardson Dopp, PA-C  Other Instructions  Weigh yourself daily, if your weight goes up more than 3 pounds in one day and that day is not on a Monday, Wednesday, and Friday take the Lasix. Call if you systolic (top number)  blood pressure is less than 100.

## 2019-10-27 ENCOUNTER — Other Ambulatory Visit: Payer: Self-pay

## 2019-10-27 ENCOUNTER — Other Ambulatory Visit: Payer: Medicare HMO | Admitting: *Deleted

## 2019-10-27 DIAGNOSIS — I5032 Chronic diastolic (congestive) heart failure: Secondary | ICD-10-CM

## 2019-10-27 LAB — BASIC METABOLIC PANEL
BUN/Creatinine Ratio: 20 (ref 10–24)
BUN: 22 mg/dL (ref 8–27)
CO2: 25 mmol/L (ref 20–29)
Calcium: 9.4 mg/dL (ref 8.6–10.2)
Chloride: 103 mmol/L (ref 96–106)
Creatinine, Ser: 1.09 mg/dL (ref 0.76–1.27)
GFR calc Af Amer: 73 mL/min/{1.73_m2} (ref >59)
GFR calc non Af Amer: 63 mL/min/{1.73_m2} (ref >59)
Glucose: 98 mg/dL (ref 65–99)
Potassium: 4.5 mmol/L (ref 3.5–5.2)
Sodium: 140 mmol/L (ref 134–144)

## 2019-11-03 ENCOUNTER — Ambulatory Visit: Payer: Medicare HMO | Admitting: Cardiology

## 2019-11-22 ENCOUNTER — Other Ambulatory Visit: Payer: Self-pay | Admitting: Cardiovascular Disease

## 2019-12-01 ENCOUNTER — Other Ambulatory Visit: Payer: Self-pay

## 2019-12-01 MED ORDER — FUROSEMIDE 40 MG PO TABS: 40.0000 mg | ORAL_TABLET | ORAL | 3 refills | Status: DC

## 2019-12-01 NOTE — Telephone Encounter (Signed)
Pt's medication was sent to pt's pharmacy as requested. Confirmation received.  °

## 2020-01-08 ENCOUNTER — Other Ambulatory Visit: Payer: Self-pay | Admitting: Cardiovascular Disease

## 2020-01-12 NOTE — Progress Notes (Signed)
Cardiology Office Note:    Date:  01/13/2020   ID:  Jeffrey Stewart, DOB Jan 16, 1935, MRN [id number]  PCP:  Christain Sacramento, MD  Cardiologist:  Mertie Moores, MD  Electrophysiologist:  None   Referring MD: Christain Sacramento, MD   Chief Complaint:  Follow-up (CHF)    Patient Profile:    Jeffrey Stewart is a 85 y.o. male with:   HFrEF w/ return of normal LVF ? Non-ischemic cardiomyopathy ? EF 35-40 by cath in 2009 ? Echocardiogram 3/15: EF 50-55   Coronary artery disease  ? Cath 2009: mLAD 40-50, D1 50, LPDA 80-90 >> med Rx  RBBB  Hyperlipidemia   Asthma/COPD  Polymyalgia rheumatica   Prior CV studies: Echocardiogram 09/18/13 EF 50-55, no RWMA, mild AI, mild BAE, mildly reduced RVSF   Cardiac catheterization 12/24/07 LAD mid 40-50; D1 50 LCx irregs; L PDA 80-90 RI minor irregs EF 35-40   History of Present Illness:    Jeffrey Stewart was last seen in clinic in 09/2019.  He returns for follow up.  He is here alone.  Since last seen, he has been doing fairly well.  Since resuming lisinopril, he has had episodes of blood pressures in the 79G systolic.  He has been holding lisinopril for several days and then resuming it.  He does feel poorly when his blood pressure is low but has not had syncope.  He has not had chest discomfort.  He has chronic shortness of breath related to asthma/COPD.  He has not had orthopnea, PND.  He wears compression stockings for lower extremity swelling.  This is overall well controlled.  Past Medical History:  Diagnosis Date  . Allergy   . Arthritis    MINOR ARTHRITIS FEET AND TOES  . Asthma   . Cataract   . CHF (congestive heart failure) ([place]) 11/2009   EF45-50%  . Coronary artery disease   . Hyperlipidemia   . Myocardial infarction ([place])   . Pneumonia    HX OF PNEUMONIA 4 OR 5 YRS AGO  . Rosacea    OF FACE  . Shortness of breath    SOMETIMES SOB OR WHEEZING WITH EXERTION    Current Medications: Current Meds  Medication Sig  . acetaminophen  (TYLENOL) 500 MG tablet Take 1,000 mg by mouth every 6 (six) hours as needed for moderate pain.  Marland Kitchen albuterol (PROVENTIL HFA;VENTOLIN HFA) 108 (90 BASE) MCG/ACT inhaler Inhale 1 puff into the lungs every 6 (six) hours as needed for wheezing.   Marland Kitchen aspirin EC 81 MG tablet Take 81 mg by mouth every evening.  Marland Kitchen atorvastatin (LIPITOR) 40 MG tablet Take 40 mg by mouth at bedtime.  . carvedilol (COREG) 12.5 MG tablet TAKE 1 TABLET TWICE DAILY WITH MEALS  . cetirizine (ZYRTEC) 10 MG tablet Take 10 mg by mouth at bedtime.   . cholecalciferol (VITAMIN D) 1000 UNITS tablet Take 1,000 Units by mouth daily.  . fish oil-omega-3 fatty acids 1000 MG capsule Take 1 g by mouth 2 (two) times daily.   . fluticasone (FLONASE) 50 MCG/ACT nasal spray Place 1-2 sprays into both nostrils daily.  . Fluticasone-Salmeterol (ADVAIR) 250-50 MCG/DOSE AEPB Inhale 1 puff into the lungs 2 (two) times daily.  . furosemide (LASIX) 40 MG tablet Take 1 tablet (40 mg total) by mouth 3 (three) times a week. Take on Monday, Wednesday, and Friday  . glucosamine-chondroitin 500-400 MG tablet Take 1 tablet by mouth daily.  Marland Kitchen lisinopril (ZESTRIL) 2.5 MG tablet Take 1  tablet (2.5 mg total) by mouth daily.  . metroNIDAZOLE (METROCREAM) 0.75 % cream Apply 1 application topically 2 (two) times daily.   . minocycline (DYNACIN) 50 MG tablet Take 50 mg by mouth 2 (two) times daily.  . NEOMYCIN-POLYMYXIN-HYDROCORTISONE (CORTISPORIN) 1 % SOLN OTIC solution Place 1 drop into both ears as needed.  . nitroGLYCERIN (NITROSTAT) 0.4 MG SL tablet Place 1 tablet (0.4 mg total) under the tongue every 5 (five) minutes as needed for chest pain.  Vladimir Faster Glycol-Propyl Glycol (SYSTANE) 0.4-0.3 % SOLN Place 1 drop into both eyes 2 (two) times daily as needed (dry eyes).  . potassium chloride SA (KLOR-CON) 20 MEQ tablet TAKE 1 TABLET EVERY DAY  . predniSONE (DELTASONE) 5 MG tablet TAKE 5 MG TABLET BY MOUTH IN THE MORNING AND 1 MG TABLET BY MOUTH AT BEDTIME  .  sodium chloride (OCEAN) 0.65 % nasal spray Place 1 spray into the nose as needed for congestion.     Allergies:   Niaspan [niacin]   Social History   Tobacco Use  . Smoking status: Never Smoker  . Smokeless tobacco: Never Used  Vaping Use  . Vaping Use: Never used  Substance Use Topics  . Alcohol use: No  . Drug use: No     Family Hx: The patient's family history includes Diabetes in his sister.  ROS   EKGs/Labs/Other Test Reviewed:    EKG:  EKG is   ordered today.  The ekg ordered today demonstrates normal sinus rhythm, heart rate 63, normal axis, right bundle branch block, QTC 448, no change from prior tracing  Recent Labs: 10/27/2019: BUN 22; Creatinine, Ser 1.09; Potassium 4.5; Sodium 140   Recent Lipid Panel Lab Results  Component Value Date/Time   CHOL 117 07/29/2018 08:48 AM   TRIG 80 07/29/2018 08:48 AM   HDL 35 (L) 07/29/2018 08:48 AM   CHOLHDL 3.3 07/29/2018 08:48 AM   CHOLHDL 3.7 05/02/2016 08:56 AM   LDLCALC 66 07/29/2018 08:48 AM    Physical Exam:    VS:  BP 102/62   Pulse 63   Ht 6' (1.829 m)   Wt 202 lb (91.6 kg)   BMI 27.40 kg/m     Wt Readings from Last 3 Encounters:  01/13/20 202 lb (91.6 kg)  10/13/19 214 lb 1.9 oz (97.1 kg)  08/01/19 213 lb 7.2 oz (96.8 kg)     Constitutional:      Appearance: Healthy appearance. Not in distress.  Neck:     Thyroid: No thyromegaly.     Vascular: JVD normal.  Pulmonary:     Effort: Pulmonary effort is normal.     Breath sounds: No wheezing. No rales.  Cardiovascular:     Normal rate. Regular rhythm. Normal S1. Normal S2.     Murmurs: There is no murmur.  Edema:    Ankle: bilateral trace edema of the ankle.    Comments: Bilateral compression stockings in place Abdominal:     Palpations: Abdomen is soft. There is no hepatomegaly.  Skin:    General: Skin is warm and dry.  Neurological:     General: No focal deficit present.     Mental Status: Alert and oriented to person, place and time.      Cranial Nerves: Cranial nerves are intact.      ASSESSMENT & PLAN:    1. Chronic heart failure with preserved ejection fraction (HCC) EF has been as low as 35-40% in the past.  This improved to 50-55% in 2015.  He is NYHA II.  Volume status appears stable.  His blood pressure has limited CHF medication titration.  I have recommended decreasing carvedilol to 6.25 mg twice daily.  Hopefully this will prevent him from having to hold lisinopril from time to time for hypotension.  -Decrease carvedilol to 6.25 mg twice daily  -Continue current dose of lisinopril (2.5 mg daily) and furosemide   -He should call if he continues to have to hold lisinopril or if blood pressure increases  2. Coronary artery disease involving native coronary artery of native heart without angina pectoris Moderate diffuse disease noted in 2009 at cardiac catheterization.  He is doing well without anginal symptoms.  Continue aspirin, atorvastatin.    Dispo:  Return in about 6 months (around 07/14/2020) for Routine Follow Up, w/ Dr. Acie Fredrickson, or Richardson Dopp, PA-C, in person.   Medication Adjustments/Labs and Tests Ordered: Current medicines are reviewed at length with the patient today.  Concerns regarding medicines are outlined above.  Tests Ordered: Orders Placed This Encounter  Procedures  . EKG 12-Lead   Medication Changes: No orders of the defined types were placed in this encounter.   Signed, Richardson Dopp, PA-C  01/13/2020 5:26 PM    [facility] [place], [place], [place]  [address] Phone: [phone]; Fax: [phone]

## 2020-01-13 ENCOUNTER — Other Ambulatory Visit: Payer: Self-pay

## 2020-01-13 ENCOUNTER — Ambulatory Visit: Payer: Medicare HMO | Admitting: Physician Assistant

## 2020-01-13 ENCOUNTER — Encounter: Payer: Self-pay | Admitting: Physician Assistant

## 2020-01-13 VITALS — BP 102/62 | HR 63 | Ht 72.0 in | Wt 202.0 lb

## 2020-01-13 DIAGNOSIS — I2510 Atherosclerotic heart disease of native coronary artery without angina pectoris: Secondary | ICD-10-CM | POA: Diagnosis not present

## 2020-01-13 DIAGNOSIS — I5032 Chronic diastolic (congestive) heart failure: Secondary | ICD-10-CM

## 2020-01-13 NOTE — Patient Instructions (Signed)
Medication Instructions:  Your physician has recommended you make the following change in your medication:   Decrease Carvedilol to 6.25mg  twice a day. (you can take 1/2 of the 12.5mg  tablet)  *If you need a refill on your cardiac medications before your next appointment, please call your pharmacy*   Lab Work: None  If you have labs (blood work) drawn today and your tests are completely normal, you will receive your results only by: Marland Kitchen MyChart Message (if you have MyChart) OR . A paper copy in the mail If you have any lab test that is abnormal or we need to change your treatment, we will call you to review the results.   Testing/Procedures: None   Follow-Up: At [facility], you and your health needs are our priority.  As part of our continuing mission to provide you with exceptional heart care, we have created designated Provider Care Teams.  These Care Teams include your primary Cardiologist (physician) and Advanced Practice Providers (APPs -  Physician Assistants and Nurse Practitioners) who all work together to provide you with the care you need, when you need it.  We recommend signing up for the patient portal called "MyChart".  Sign up information is provided on this After Visit Summary.  MyChart is used to connect with patients for Virtual Visits (Telemedicine).  Patients are able to view lab/test results, encounter notes, upcoming appointments, etc.  Non-urgent messages can be sent to your provider as well.   To learn more about what you can do with MyChart, go to [email or web address].    Your next appointment:   6 month(s)  The format for your next appointment:   In Person  Provider:   You may see Mertie Moores, MD or one of the following Advanced Practice Providers on your designated Care Team:    Richardson Dopp, PA-C  Vin Weldon, [place]    Other Instructions Call if you blood pressure runs low and you have to hold Lisinopril or if you blood pressure is too high  (140/90 or higher)

## 2020-02-06 ENCOUNTER — Other Ambulatory Visit: Payer: Self-pay | Admitting: Cardiovascular Disease

## 2020-07-18 ENCOUNTER — Encounter: Payer: Self-pay | Admitting: Cardiovascular Disease

## 2020-07-18 NOTE — Progress Notes (Signed)
Cardiology Office Note   Date:  07/19/2020   ID:  Jeffrey Stewart, DOB 1934-08-13, MRN [id number]  PCP:  Christain Sacramento, MD  Cardiologist: Mertie Moores, MD   Chief Complaint  Patient presents with  . Coronary Artery Disease  . Congestive Heart Failure   1. Congestive heart failure-chronic systolic congestive heart failure Left ventricle: The cavity size was moderately to severely dilated. Wall thickness was normal. Systolic function was normal. The estimated ejection fraction was in the range of 50% to 55%. Wall motion was normal; there were no regional wall motion abnormalities. There was an increased relative contribution of atrial contraction to ventricular filling. - Aortic valve: Mild regurgitation. - Left atrium: The atrium was mildly dilated. - Right ventricle: The cavity size was moderately dilated. Systolic function was mildly reduced. - Right atrium: The atrium was mildly dilated 2. RBBB 3. Mild coronary artery disease- The left anterior descending artery is moderately calcified. There is a mid 40%-50% stenosis right at the takeoff of the first diagonal artery. D1 - moderate stenosis.  The left circumflex artery is large and is dominant.  80-90% PDA stenosis  The ramus intermediate vessel has minor luminal irregularities. The right coronary artery is small, it is nondominant.  LV gram -  EF  35-40%. There is hypokinesis of the anterior wall, apex, and inferoapical wall. The aortic root appears to be moderately dilated.  4. Hyperlipidemia   86 yo gentleman with a hx of mild CHF. He walks occasionally. He has been exercising on a regular basis. He works out in his garden and walks on his treadmill in the evenings. He's not had any significant episodes of chest pain or shortness breath.   Feb. 6, 2014  Jeffrey Stewart is doing well since I last saw him. He's not had any episodes of chest pain or shortness breath. He's exercising on a regular  basis.  March 10, 2013:  Jeffrey Stewart is doing well. He's not had any episodes of chest pain or shortness breath. Playing golf, fishing, gardening, boating this past summer. Tries to avoid salt. No dypsnea.   Feb. 24, 2015:  Pt is doing OK. No CP. He has some abdominal issues - has 2 hernias. Will be seen the surgeons today.  Sept. 3, 2015:  Jeffrey Stewart is doing well. Had his hernias repaired No CP, no dyspnea. Staying active. Walks regularly.    September 15, 2014:   Jeffrey Stewart is a 86 y.o. male who presents for follow up of his CHF Occasional DOE, no CP.    Aug. 30, 2016:  Doing well.  Has some allergies . No CP or dyspnea.  Works out in the garden   Feb. 28, 2017:  Doing well.  Working in his garden   Oct. 17, 2017: Has had a rough summer. Injured his shoulders working on his floating dock this summer.  Now he is having aches all over , thinks its the atorvastatin  Lots of leg and feet swelling   Jan. 11, 2019:   history of chronic systolic congestive heart failure.  His last echocardiogram reveals normal left ventricular systolic function. He still has some occasional shortness of breath with exertion. He does not exercise quite as much as he used to.  Is raising his great grandchildren .  January 28, 2018: Doing ok Raising 2 of his great grandchildren - 2 teenage girls. No CP,  Some Doe, Raising his garden     Jan.. 13, 2020 No CP ,  doingwell  Breathing is ok Stays busy - raises his great grandchildren    67  Enjoys her garden .    Jan. 15, 2021 Doing well.   bp is  A bit low today  Eating and drinking well  No CP or dyspnea   Jan. 3, 2022 Jeffrey Stewart is seen today for follow up of his CHF Has been walking 1-1.5 miles a day .  No cp, no significant dyspnea  His greatgrandchildren are not living with him.   They were placed in a foster home and have run away .   He does not know how to contact them   Past Medical History:  Diagnosis Date  . Allergy   .  Arthritis    MINOR ARTHRITIS FEET AND TOES  . Asthma   . Cataract   . CHF (congestive heart failure) ([place]) 11/2009   EF45-50%  . Coronary artery disease   . Hyperlipidemia   . Myocardial infarction ([place] [facility])   . Pneumonia    HX OF PNEUMONIA 4 OR 5 YRS AGO  . Rosacea    OF FACE  . Shortness of breath    SOMETIMES SOB OR WHEEZING WITH EXERTION    Past Surgical History:  Procedure Laterality Date  . APPENDECTOMY     AGE 62  . CARDIAC CATHETERIZATION  11/2009   mild / mod cad,MODERATE TIGHT STENOSIS IN THE DISTAL LEFT CIRCUMFLEX ARTERY  . ELECTROCARDIOGRAM  11/2009   RBBB  . EYE SURGERY     BILATERAL CATARACT EXTRACT EXTRACTION WITH IMPLANTS  . INGUINAL HERNIA REPAIR Bilateral 09/29/2013   Procedure: LAPAROSCOPIC BILATERAL  INGUINAL HERNIA;  Surgeon: [name], MD;  Location: [facility];  Service: General;  Laterality: Bilateral;  . INSERTION OF MESH Bilateral 09/29/2013   Procedure: INSERTION OF MESH;  Surgeon: [name], MD;  Location: [facility];  Service: General;  Laterality: Bilateral;  . last echo 02/2009    . last nuc 2009  12/16/2007   EF 33%. ABNORMAL. 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 US ECHOCARDIOGRAPHY  02/23/2009   EF 45-50%  . US ECHOCARDIOGRAPHY  12/27/2007   EF 45-50%     Current Outpatient Medications  Medication Sig Dispense Refill  . acetaminophen (TYLENOL) 500 MG tablet Take 1,000 mg by mouth every 6 (six) hours as needed for moderate pain.    Marland Kitchen albuterol (PROVENTIL HFA;VENTOLIN HFA) 108 (90 BASE) MCG/ACT inhaler Inhale 1 puff into the lungs every 6 (six) hours as needed for wheezing.     Marland Kitchen aspirin EC 81 MG tablet Take 81 mg by mouth every evening.    Marland Kitchen atorvastatin (LIPITOR) 40 MG tablet TAKE 1 TABLET AT BEDTIME 90 tablet 3  . carvedilol (COREG) 12.5 MG tablet TAKE 1 TABLET TWICE DAILY WITH MEALS 180 tablet 3  . cetirizine (ZYRTEC) 10 MG tablet Take 10 mg by mouth at bedtime.    . cholecalciferol (VITAMIN D)  1000 UNITS tablet Take 1,000 Units by mouth daily.    . fish oil-omega-3 fatty acids 1000 MG capsule Take 1 g by mouth 2 (two) times daily.    . fluticasone (FLONASE) 50 MCG/ACT nasal spray Place 1-2 sprays into both nostrils daily.    . Fluticasone-Salmeterol (ADVAIR) 250-50 MCG/DOSE AEPB Inhale 1 puff into the lungs 2 (two) times daily.    . furosemide (LASIX) 40 MG tablet Take 1 tablet (40 mg total) by mouth 3 (three) times a week. Take on Monday, Wednesday, and Friday  45 tablet 3  . glucosamine-chondroitin 500-400 MG tablet Take 1 tablet by mouth daily.    Marland Kitchen lisinopril (ZESTRIL) 2.5 MG tablet Take 1 tablet (2.5 mg total) by mouth daily. 90 tablet 3  . metroNIDAZOLE (METROCREAM) 0.75 % cream Apply 1 application topically 2 (two) times daily.     . minocycline (DYNACIN) 50 MG tablet Take 50 mg by mouth 2 (two) times daily.    . NEOMYCIN-POLYMYXIN-HYDROCORTISONE (CORTISPORIN) 1 % SOLN OTIC solution Place 1 drop into both ears as needed.    . nitroGLYCERIN (NITROSTAT) 0.4 MG SL tablet Place 1 tablet (0.4 mg total) under the tongue every 5 (five) minutes as needed for chest pain. 25 tablet 5  . Polyethyl Glycol-Propyl Glycol 0.4-0.3 % SOLN Place 1 drop into both eyes 2 (two) times daily as needed (dry eyes).    . potassium chloride SA (KLOR-CON) 20 MEQ tablet TAKE 1 TABLET EVERY DAY 90 tablet 2  . predniSONE (DELTASONE) 5 MG tablet TAKE 5 MG TABLET BY MOUTH IN THE MORNING AND 1 MG TABLET BY MOUTH AT BEDTIME    . sodium chloride (OCEAN) 0.65 % nasal spray Place 1 spray into the nose as needed for congestion.     No current facility-administered medications for this visit.    Allergies:   Niaspan [niacin]    Social History:  The patient  reports that he has never smoked. He has never used smokeless tobacco. He reports that he does not drink alcohol and does not use drugs.   Family History:  The patient's family history includes Diabetes in his sister.    ROS:   Noted in current hx.   Otherwise negative.   Physical Exam: Blood pressure 108/62, pulse 70, height 6' (1.829 m), weight 216 lb (98 kg), SpO2 95 %.  GEN:  Well nourished, well developed in no acute distress HEENT: Normal NECK: No JVD; No carotid bruits LYMPHATICS: No lymphadenopathy CARDIAC: RRR , no murmurs, rubs, gallops RESPIRATORY:  Clear to auscultation without rales, wheezing or rhonchi  ABDOMEN: Soft, non-tender, non-distended MUSCULOSKELETAL:  No edema; No deformity  SKIN: Warm and dry NEUROLOGIC:  Alert and oriented x 3    EKG:    .   Recent Labs: 10/27/2019: BUN 22; Creatinine, Ser 1.09; Potassium 4.5; Sodium 140    Lipid Panel    Component Value Date/Time   CHOL 117 07/29/2018 0848   TRIG 80 07/29/2018 0848   HDL 35 (L) 07/29/2018 0848   CHOLHDL 3.3 07/29/2018 0848   CHOLHDL 3.7 05/02/2016 0856   VLDL 15 05/02/2016 0856   LDLCALC 66 07/29/2018 0848      Wt Readings from Last 3 Encounters:  07/19/20 216 lb (98 kg)  01/13/20 202 lb (91.6 kg)  10/13/19 214 lb 1.9 oz (97.1 kg)      Other studies Reviewed: Additional studies/ records that were reviewed today include: . Review of the above records demonstrates:    ASSESSMENT AND PLAN:  1. Congestive heart failure-chronic systolic congestive heart failure Seems to be stable .  Walking every day   2. RBBB-     3. Mild coronary artery disease- no angina    4. Hyperlipidemia  -   Check lipids , liver enz, bmp today   Current medicines are reviewed at length with the patient today.  The patient does not have concerns regarding medicines.  The following changes have been made:  no change  Disposition:    With me in 1 year   Signed, Loistine Chance  Cristel Rail, MD  07/19/2020 9:08 AM    [facility] [address], [place], [place]  [address] Phone: [phone]; Fax: [phone]

## 2020-07-19 ENCOUNTER — Encounter: Payer: Self-pay | Admitting: Cardiovascular Disease

## 2020-07-19 ENCOUNTER — Other Ambulatory Visit: Payer: Self-pay

## 2020-07-19 ENCOUNTER — Ambulatory Visit: Payer: Medicare HMO | Admitting: Cardiovascular Disease

## 2020-07-19 VITALS — BP 108/62 | HR 70 | Ht 72.0 in | Wt 216.0 lb

## 2020-07-19 DIAGNOSIS — I5022 Chronic systolic (congestive) heart failure: Secondary | ICD-10-CM

## 2020-07-19 DIAGNOSIS — E782 Mixed hyperlipidemia: Secondary | ICD-10-CM | POA: Diagnosis not present

## 2020-07-19 LAB — LIPID PANEL
Chol/HDL Ratio: 2.8 ratio (ref 0.0–5.0)
Cholesterol, Total: 102 mg/dL (ref 100–199)
HDL: 36 mg/dL — ABNORMAL LOW (ref >39)
LDL Chol Calc (NIH): 48 mg/dL (ref 0–99)
Triglycerides: 95 mg/dL (ref 0–149)
VLDL Cholesterol Cal: 18 mg/dL (ref 5–40)

## 2020-07-19 LAB — BASIC METABOLIC PANEL
BUN/Creatinine Ratio: 19 (ref 10–24)
BUN: 21 mg/dL (ref 8–27)
CO2: 26 mmol/L (ref 20–29)
Calcium: 9 mg/dL (ref 8.6–10.2)
Chloride: 100 mmol/L (ref 96–106)
Creatinine, Ser: 1.12 mg/dL (ref 0.76–1.27)
GFR calc Af Amer: 70 mL/min/{1.73_m2} (ref >59)
GFR calc non Af Amer: 60 mL/min/{1.73_m2} (ref >59)
Glucose: 103 mg/dL — ABNORMAL HIGH (ref 65–99)
Potassium: 4.9 mmol/L (ref 3.5–5.2)
Sodium: 136 mmol/L (ref 134–144)

## 2020-07-19 LAB — HEPATIC FUNCTION PANEL
ALT: 15 IU/L (ref 0–44)
AST: 17 IU/L (ref 0–40)
Albumin: 4.2 g/dL (ref 3.6–4.6)
Alkaline Phosphatase: 73 IU/L (ref 44–121)
Bilirubin Total: 1.4 mg/dL — ABNORMAL HIGH (ref 0.0–1.2)
Bilirubin, Direct: 0.38 mg/dL (ref 0.00–0.40)
Total Protein: 5.9 g/dL — ABNORMAL LOW (ref 6.0–8.5)

## 2020-07-19 NOTE — Patient Instructions (Signed)
Medication Instructions:  Your physician recommends that you continue on your current medications as directed. Please refer to the Current Medication list given to you today.  *If you need a refill on your cardiac medications before your next appointment, please call your pharmacy*   Lab Work: Lipid panel, Liver panel and BMET today If you have labs (blood work) drawn today and your tests are completely normal, you will receive your results only by: Marland Kitchen MyChart Message (if you have MyChart) OR . A paper copy in the mail If you have any lab test that is abnormal or we need to change your treatment, we will call you to review the results.   Testing/Procedures: None ordered.    Follow-Up: At [facility], you and your health needs are our priority.  As part of our continuing mission to provide you with exceptional heart care, we have created designated Provider Care Teams.  These Care Teams include your primary Cardiologist (physician) and Advanced Practice Providers (APPs -  Physician Assistants and Nurse Practitioners) who all work together to provide you with the care you need, when you need it.  We recommend signing up for the patient portal called "MyChart".  Sign up information is provided on this After Visit Summary.  MyChart is used to connect with patients for Virtual Visits (Telemedicine).  Patients are able to view lab/test results, encounter notes, upcoming appointments, etc.  Non-urgent messages can be sent to your provider as well.   To learn more about what you can do with MyChart, go to [email or web address].    Your next appointment:   12 month(s)  The format for your next appointment:   In Person  Provider:   Kristeen Miss, MD

## 2020-07-20 ENCOUNTER — Encounter: Payer: Self-pay | Admitting: *Deleted

## 2020-08-11 ENCOUNTER — Other Ambulatory Visit: Payer: Self-pay | Admitting: Cardiovascular Disease

## 2020-09-01 ENCOUNTER — Other Ambulatory Visit: Payer: Self-pay | Admitting: Cardiovascular Disease

## 2021-01-12 ENCOUNTER — Telehealth: Payer: Self-pay | Admitting: Physician Assistant

## 2021-01-12 ENCOUNTER — Other Ambulatory Visit: Payer: Self-pay | Admitting: Physician Assistant

## 2021-01-12 DIAGNOSIS — R0600 Dyspnea, unspecified: Secondary | ICD-10-CM

## 2021-01-12 NOTE — Telephone Encounter (Signed)
Received a new hem referral from Jeffrey Mocha, PA for Immature Neutrophils Myelocycles. Jeffrey Stewart has been cld and scheduled to see Jeffrey Stewart on 7/8 at 2pm. Pt aware toa rrive 15 minutes early.

## 2021-01-21 ENCOUNTER — Encounter: Payer: Self-pay | Admitting: Physician Assistant

## 2021-01-21 ENCOUNTER — Other Ambulatory Visit: Payer: Self-pay

## 2021-01-21 ENCOUNTER — Inpatient Hospital Stay: Payer: Medicare HMO | Attending: Physician Assistant | Admitting: Physician Assistant

## 2021-01-21 ENCOUNTER — Inpatient Hospital Stay: Payer: Medicare HMO

## 2021-01-21 VITALS — BP 134/66 | HR 71 | Temp 98.1°F | Resp 18 | Ht 72.0 in | Wt 219.3 lb

## 2021-01-21 DIAGNOSIS — D7589 Other specified diseases of blood and blood-forming organs: Secondary | ICD-10-CM | POA: Diagnosis present

## 2021-01-21 DIAGNOSIS — Z79899 Other long term (current) drug therapy: Secondary | ICD-10-CM | POA: Insufficient documentation

## 2021-01-21 LAB — SEDIMENTATION RATE: Sed Rate: 1 mm/hr (ref 0–16)

## 2021-01-21 LAB — CBC WITH DIFFERENTIAL (CANCER CENTER ONLY)
Abs Immature Granulocytes: 0.25 10*3/uL — ABNORMAL HIGH (ref 0.00–0.07)
Basophils Absolute: 0.1 10*3/uL (ref 0.0–0.1)
Basophils Relative: 1 %
Eosinophils Absolute: 0 10*3/uL (ref 0.0–0.5)
Eosinophils Relative: 0 %
HCT: 40.2 % (ref 39.0–52.0)
Hemoglobin: 13.9 g/dL (ref 13.0–17.0)
Immature Granulocytes: 3 %
Lymphocytes Relative: 15 %
Lymphs Abs: 1.4 10*3/uL (ref 0.7–4.0)
MCH: 36.4 pg — ABNORMAL HIGH (ref 26.0–34.0)
MCHC: 34.6 g/dL (ref 30.0–36.0)
MCV: 105.2 fL — ABNORMAL HIGH (ref 80.0–100.0)
Monocytes Absolute: 0.7 10*3/uL (ref 0.1–1.0)
Monocytes Relative: 7 %
Neutro Abs: 7.1 10*3/uL (ref 1.7–7.7)
Neutrophils Relative %: 74 %
Platelet Count: 127 10*3/uL — ABNORMAL LOW (ref 150–400)
RBC: 3.82 MIL/uL — ABNORMAL LOW (ref 4.22–5.81)
RDW: 15.4 % (ref 11.5–15.5)
WBC Count: 9.5 10*3/uL (ref 4.0–10.5)
nRBC: 0 % (ref 0.0–0.2)

## 2021-01-21 LAB — CMP (CANCER CENTER ONLY)
ALT: 12 U/L (ref 0–44)
AST: 16 U/L (ref 15–41)
Albumin: 3.6 g/dL (ref 3.5–5.0)
Alkaline Phosphatase: 56 U/L (ref 38–126)
Anion gap: 7 (ref 5–15)
BUN: 17 mg/dL (ref 8–23)
CO2: 27 mmol/L (ref 22–32)
Calcium: 9.1 mg/dL (ref 8.9–10.3)
Chloride: 101 mmol/L (ref 98–111)
Creatinine: 0.95 mg/dL (ref 0.61–1.24)
GFR, Estimated: >60 mL/min (ref >60)
Glucose, Bld: 110 mg/dL — ABNORMAL HIGH (ref 70–99)
Potassium: 4.6 mmol/L (ref 3.5–5.1)
Sodium: 135 mmol/L (ref 135–145)
Total Bilirubin: 1.3 mg/dL — ABNORMAL HIGH (ref 0.3–1.2)
Total Protein: 5.9 g/dL — ABNORMAL LOW (ref 6.5–8.1)

## 2021-01-21 LAB — FOLATE: Folate: 38 ng/mL (ref >5.9)

## 2021-01-21 LAB — VITAMIN B12: Vitamin B-12: 488 pg/mL (ref 180–914)

## 2021-01-21 LAB — C-REACTIVE PROTEIN: CRP: 0.5 mg/dL (ref <1.0)

## 2021-01-21 LAB — SAVE SMEAR(SSMR), FOR PROVIDER SLIDE REVIEW

## 2021-01-21 NOTE — Progress Notes (Signed)
[name] [phone]   Fax:([phone] [name]: Christain Sacramento, MD as PCP - General (Family Medicine) Nahser, Wonda Cheng, MD as PCP - Cardiology (Cardiology) Fanny Skates, MD as Consulting Physician (General Surgery)  Hematological/Oncological History 1) Labs from PCP, Tia McPhatter: -12/29/2020: WBC 9.8, Hgb 14.4, MCV 103.7, Plt 168,  Band Abs 0.8 (H), Myelo Abs 0.4 (H) -01/06/2021: WBC 9.8, Hgb 14.2, MCV 103.4, Plt 207, Mono Manuel Ab 1.1 (H), Meta Absolute 0.2 (H)  2) 01/21/2021: Establish care with Dede Query PA-C  CHIEF COMPLAINTS/PURPOSE OF CONSULTATION:  Elevated metamyelocytes  HISTORY OF PRESENTING ILLNESS:  Jeffrey Stewart 86 y.o. male with medical history significant for CHF, CAD, hyperlipidemia and RBBB. Patient is accompanied by his wife for this visit. Patient is feeling well since recovering from pneumonia three weeks ago. His energy and appetite have recovered. He is able to complete his ADLs on his own. He uses a cane to assist with ambulation. Patient denies any nausea, vomiting or abdominal pain. He reports one episode of diarrhea earlier this week that resolved on its own. Patient denies easy bruising or signs of bleeding. Patient has chronic shortness of breath with exertion, none at rest. He denies any fevers, chills, night sweats, chest pain or cough. He has no other complaints. Rest of the 10 point ROS is below.   MEDICAL HISTORY:  Past Medical History:  Diagnosis Date   Allergy    Arthritis    MINOR ARTHRITIS FEET AND TOES   Asthma    Cataract    CHF (congestive heart failure) ([place]) 11/2009   EF45-50%   Coronary artery disease    Hyperlipidemia    Myocardial infarction ([place])    Pneumonia    HX OF PNEUMONIA 4 OR 5 YRS AGO   Rosacea    OF FACE   Shortness of breath    SOMETIMES SOB OR WHEEZING WITH EXERTION    SURGICAL HISTORY: Past Surgical History:  Procedure Laterality Date    APPENDECTOMY     AGE 62   CARDIAC CATHETERIZATION  11/2009   mild / mod cad,MODERATE TIGHT STENOSIS IN THE DISTAL LEFT CIRCUMFLEX ARTERY   ELECTROCARDIOGRAM  11/2009   RBBB   EYE SURGERY     BILATERAL CATARACT EXTRACT EXTRACTION WITH IMPLANTS   INGUINAL HERNIA REPAIR Bilateral 09/29/2013   Procedure: LAPAROSCOPIC BILATERAL  INGUINAL HERNIA;  Surgeon: [name], MD;  Location: [facility];  Service: General;  Laterality: Bilateral;   INSERTION OF MESH Bilateral 09/29/2013   Procedure: INSERTION OF MESH;  Surgeon: [name], MD;  Location: [facility];  Service: General;  Laterality: Bilateral;   last echo 02/2009     last nuc 2009  12/16/2007   EF 33%. ABNORMAL. 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   Drug use: No   Sexual activity: Not on file  Other Topics Concern   Not on file  Social History Narrative   Not on file   Social Determinants of Health   Financial Resource Strain: Not on file  Food Insecurity: Not on file  Transportation Needs: Not on file  Physical Activity: Not on file  Stress: Not on file  Social Connections: Not on file  Intimate Partner Violence: Not on file    FAMILY HISTORY: Family History  Problem Relation Age of Onset   Diabetes Sister    Lung cancer Sister        smoker    ALLERGIES:  is allergic to niaspan [niacin] and meloxicam.  MEDICATIONS:  Current  Outpatient Medications  Medication Sig Dispense Refill   acetaminophen (TYLENOL) 500 MG tablet Take 1,000 mg by mouth every 6 (six) hours as needed for moderate pain.     albuterol (PROVENTIL HFA;VENTOLIN HFA) 108 (90 BASE) MCG/ACT inhaler Inhale 1 puff into the lungs every 6 (six) hours as needed for wheezing.      aspirin EC 81 MG tablet Take 81 mg by mouth every evening.     atorvastatin (LIPITOR) 40 MG tablet TAKE 1 TABLET AT BEDTIME 90 tablet 3   carvedilol (COREG) 12.5 MG tablet TAKE 1 TABLET TWICE DAILY WITH MEALS 180 tablet 3   cetirizine (ZYRTEC) 10 MG tablet Take 10 mg by mouth at bedtime.     cholecalciferol (VITAMIN D) 1000 UNITS tablet Take 1,000 Units by mouth daily.     fish oil-omega-3 fatty acids 1000 MG capsule Take 1 g by mouth 2 (two) times daily.     fluticasone (FLONASE) 50 MCG/ACT nasal spray Place 1-2 sprays into both nostrils daily.     Fluticasone-Salmeterol (ADVAIR) 250-50 MCG/DOSE AEPB Inhale 1 puff into the lungs 2 (two) times daily.     furosemide (LASIX) 40 MG tablet TAKE 1 TABLET THREE TIMES WEEKLY ON MONDAY, WEDNESDAY, AND FRIDAY 45 tablet 3   glucosamine-chondroitin 500-400 MG tablet Take 1 tablet by mouth daily.     lisinopril (ZESTRIL) 2.5 MG tablet Take 1 tablet (2.5 mg total) by mouth daily. 90 tablet 3   metroNIDAZOLE (METROCREAM) 0.75 % cream Apply 1 application topically 2 (two) times daily.      minocycline (DYNACIN) 50 MG tablet Take 50 mg by mouth 2 (two) times daily.     NEOMYCIN-POLYMYXIN-HYDROCORTISONE (CORTISPORIN) 1 % SOLN OTIC solution Place 1 drop into both ears as needed.     nitroGLYCERIN (NITROSTAT) 0.4 MG SL tablet Place 1 tablet (0.4 mg total) under the tongue every 5 (five) minutes as needed for chest pain. 25 tablet 5   Polyethyl Glycol-Propyl Glycol 0.4-0.3 % SOLN Place 1 drop into both eyes 2 (two) times daily as needed (dry eyes).     potassium chloride SA (KLOR-CON) 20 MEQ tablet TAKE 1 TABLET EVERY DAY 90 tablet 2   predniSONE  (DELTASONE) 5 MG tablet TAKE 5 MG TABLET BY MOUTH IN THE MORNING AND 1 MG TABLET BY MOUTH AT BEDTIME     sodium chloride (OCEAN) 0.65 % nasal spray Place 1 spray into the nose as needed for congestion.     No current facility-administered medications for this visit.    REVIEW OF SYSTEMS:   Constitutional: ( - ) fevers, ( - )  chills , ( - ) night sweats Eyes: ( - ) blurriness of vision, ( - ) double  vision, ( - ) watery eyes Ears, nose, mouth, throat, and face: ( - ) mucositis, ( - ) sore throat Respiratory: ( - ) cough, ( - ) dyspnea, ( - ) wheezes Cardiovascular: ( - ) palpitation, ( - ) chest discomfort, ( - ) lower extremity swelling Gastrointestinal:  ( - ) nausea, ( - ) heartburn, ( - ) change in bowel habits Skin: ( - ) abnormal skin rashes Lymphatics: ( - ) new lymphadenopathy, ( - ) easy bruising Neurological: ( - ) numbness, ( - ) tingling, ( - ) new weaknesses Behavioral/Psych: ( - ) mood change, ( - ) new changes  All other systems were reviewed with the patient and are negative.  PHYSICAL EXAMINATION: ECOG PERFORMANCE STATUS: 0 - Asymptomatic  Vitals:   01/21/21 1055  BP: 134/66  Pulse: 71  Resp: 18  Temp: 98.1 F (36.7 C)  SpO2: 100%   Filed Weights   01/21/21 1055  Weight: 219 lb 4.8 oz (99.5 kg)    GENERAL: well appearing male in NAD  SKIN: skin color, texture, turgor are normal, no rashes or significant lesions EYES: conjunctiva are pink and non-injected, sclera clear OROPHARYNX: no exudate, no erythema; lips, buccal mucosa, and tongue normal  NECK: supple, non-tender LYMPH:  no palpable lymphadenopathy in the cervical, axillary or supraclavicular lymph nodes.  LUNGS: clear to auscultation and percussion with normal breathing effort HEART: regular rate & rhythm and no murmurs and no lower extremity edema ABDOMEN: soft, non-tender, non-distended, normal bowel sounds Musculoskeletal: no cyanosis of digits and no clubbing  PSYCH: alert & oriented x 3,  fluent speech NEURO: no focal motor/sensory deficits  LABORATORY DATA:  I have reviewed the data as listed CBC Latest Ref Rng & Units 01/21/2021 09/24/2013 09/03/2013  WBC 4.0 - 10.5 K/uL 9.5 6.8 6.0  Hemoglobin 13.0 - 17.0 g/dL 13.9 15.6 14.9  Hematocrit 39.0 - 52.0 % 40.2 45.1 43.2  Platelets 150 - 400 K/uL 127(L) 123(L) 130(L)    CMP Latest Ref Rng & Units 01/21/2021 07/19/2020 10/27/2019  Glucose 70 - 99 mg/dL 110(H) 103(H) 98  BUN 8 - 23 mg/dL _0 Creatinine 0.61 - 1.24 mg/dL 0.95 1.12 1.09  Sodium 135 - 145 mmol/L 135 136 140  Potassium 3.5 - 5.1 mmol/L 4.6 4.9 4.5  Chloride 98 - 111 mmol/L 101 100 103  CO2 22 - 32 mmol/L _1 Calcium 8.9 - 10.3 mg/dL 9.1 9.0 9.4  Total Protein 6.5 - 8.1 g/dL 5.9(L) 5.9(L) -  Total Bilirubin 0.3 - 1.2 mg/dL 1.3(H) 1.4(H) -  Alkaline Phos 38 - 126 U/L 56 73 -  AST 15 - 41 U/L 16 17 -  ALT 0 - 44 U/L 12 15 -    ASSESSMENT & PLAN Jeffrey Stewart is a 86 y.o. male that presents to the clinic for evaluation on elevated metamyelocytes. I reviewed underlying etiologies including infectious process, inflammatory process or bone marrow disorders. Likely cause is recent pneumonia infection that occurred at the same time as the lab changes.   In addition, there is evidence of macrocytosis seen on outside labs. I reviewed potential causes including nutritional deficiences, medications, reticulocytosis, alcohol abuse or bone marrow disorders. Patient does not drink alcohol at this time and is not taking any medication that could cause macrocytosis.   I recommend labs to check CBC, CMP, Vitamin B12, MMA, folate, ESR, Sed Rate and Save Smear.   Increased metamyelocytes: --Likely secondary to recent pneumonia infection.  --  Will rule out inflammatory processes by checking CPR and Sed rate. --Will review save smear to evaluate for potential bone marrow disorders.   Macrocytosis: --Etiology unknown --Patient is not taking any medication that could cause  macrocytosis --Patient has not consumed alcohol recently.  --Labs today to check CBC, CMP, vitmain B12, MMA, folate and save smear.   Follow up:  Based on above workup. Tentatively plan for return appt with labs in 3 months.    Orders Placed This Encounter  Procedures   CBC with Differential ([facility] Only)    Standing Status:   Future    Number of Occurrences:   1    Standing Expiration Date:   01/21/2022   CMP ([facility] only)    Standing Status:   Future    Number of Occurrences:   1    Standing Expiration Date:   01/21/2022   Save Smear ([facility])    Standing Status:   Future    Number of Occurrences:   1    Standing Expiration Date:   01/21/2022   Vitamin B12    Standing Status:   Future    Number of Occurrences:   1    Standing Expiration Date:   01/21/2022   Methylmalonic acid, serum    Standing Status:   Future    Number of Occurrences:   1    Standing Expiration Date:   01/21/2022   Folate, Serum    Standing Status:   Future    Number of Occurrences:   1    Standing Expiration Date:   01/21/2022   Sedimentation rate    Standing Status:   Future    Number of Occurrences:   1    Standing Expiration Date:   01/21/2022   C-reactive protein    Standing Status:   Future    Number of Occurrences:   1    Standing Expiration Date:   01/21/2022    All questions were answered. The patient knows to call the clinic with any problems, questions or concerns.  I have spent a total of 60 minutes minutes of face-to-face and non-face-to-face time, preparing to see the patient, obtaining and/or reviewing separately obtained history, performing a medically appropriate examination, counseling and educating the patient, ordering tests, documenting clinical information in the electronic health record, and care coordination.   Dede Query, PA-C Department of Hematology/Oncology [place] at [facility] Phone: [phone]  Patient was seen with Dr. Lorenso Courier.   I  have read the above note and personally examined the patient. I agree with the assessment and plan as noted above.  Briefly Jeffrey Stewart is an 86 year old male who presents for evaluation of macrocytosis and increased metamyelocytes.  Likely increased left shifting from patient's recent pneumonia infection.  We will recheck labs today to see if this is changed.  Additionally due to the macrocytosis we will check the patient for nutritional deficiencies.  Denies alcohol use and is not currently taking any medications known to cause macrocytosis.  Would recommend monitoring this on follow-up.   Ledell Peoples, MD Department of Hematology/Oncology [place] at [facility] Phone: [phone] Pager: [phone] Email: [email or web address]

## 2021-01-22 ENCOUNTER — Other Ambulatory Visit: Payer: Self-pay | Admitting: Cardiovascular Disease

## 2021-01-25 ENCOUNTER — Telehealth: Payer: Self-pay | Admitting: Physician Assistant

## 2021-01-25 NOTE — Telephone Encounter (Signed)
Scheduled per los. Called and left msg. Mailed printout  °

## 2021-01-26 ENCOUNTER — Telehealth: Payer: Self-pay | Admitting: *Deleted

## 2021-01-26 LAB — METHYLMALONIC ACID, SERUM: Methylmalonic Acid, Quantitative: 216 nmol/L (ref 0–378)

## 2021-01-26 NOTE — Telephone Encounter (Signed)
Receive vm message from patient, inquiring about his lab results from his visit with Dede Query, PA on 01/21/21 Pt requests a call back.

## 2021-01-30 ENCOUNTER — Telehealth (HOSPITAL_COMMUNITY): Payer: Self-pay | Admitting: Physician Assistant

## 2021-01-30 NOTE — Telephone Encounter (Signed)
I called Jeffrey Stewart and reviewed labs from 01/21/2021. I explained there is evidence of macrocytosis but vitamin B12 and folate levels were within normal limits. Additionally, the inflammatory markers were unremarkable. Since there is no evidence of anemia and thrombocytopenia has been stable for several years, we recommend to monitor for now and return in 3 months with repeat labs.   Patient expressed understanding of the plan provided.

## 2021-01-31 ENCOUNTER — Other Ambulatory Visit: Payer: Self-pay

## 2021-01-31 ENCOUNTER — Ambulatory Visit
Admission: RE | Admit: 2021-01-31 | Discharge: 2021-01-31 | Disposition: A | Discharge status: Home / Self Care | Payer: Medicare HMO | Source: Ambulatory Visit | Attending: Physician Assistant | Admitting: Physician Assistant

## 2021-01-31 DIAGNOSIS — R0600 Dyspnea, unspecified: Secondary | ICD-10-CM

## 2021-02-04 ENCOUNTER — Other Ambulatory Visit: Payer: Self-pay | Admitting: Cardiovascular Disease

## 2021-02-17 ENCOUNTER — Other Ambulatory Visit: Payer: Self-pay

## 2021-02-17 MED ORDER — LISINOPRIL 2.5 MG PO TABS: 2.5000 mg | ORAL_TABLET | Freq: Every day | ORAL | 1 refills | Status: DC

## 2021-03-14 ENCOUNTER — Other Ambulatory Visit: Payer: Self-pay | Admitting: *Deleted

## 2021-03-14 ENCOUNTER — Telehealth: Payer: Self-pay | Admitting: Cardiovascular Disease

## 2021-03-14 MED ORDER — LISINOPRIL 2.5 MG PO TABS: 2.5000 mg | ORAL_TABLET | Freq: Every day | ORAL | 0 refills | Status: DC

## 2021-03-14 NOTE — Telephone Encounter (Signed)
*  STAT* If patient is at the pharmacy, call can be transferred to refill team.   1. Which medications need to be refilled? (please list name of each medication and dose if known) lisinopril (ZESTRIL) 2.5 MG tablet   2. Which pharmacy/location (including street and city if local pharmacy) is medication to be sent to?[facility] ([facility] - [place], [place]  3. Do they need a 30 day or 90 day supply? 90ds    Pt is out of meds

## 2021-03-15 ENCOUNTER — Other Ambulatory Visit: Payer: Self-pay

## 2021-03-15 ENCOUNTER — Ambulatory Visit (INDEPENDENT_AMBULATORY_CARE_PROVIDER_SITE_OTHER): Payer: Medicare HMO | Admitting: Pulmonary Disease

## 2021-03-15 ENCOUNTER — Encounter: Payer: Self-pay | Admitting: Pulmonary Disease

## 2021-03-15 VITALS — BP 144/70 | HR 72 | Temp 98.0°F | Ht 72.0 in | Wt 223.0 lb

## 2021-03-15 DIAGNOSIS — J849 Interstitial pulmonary disease, unspecified: Secondary | ICD-10-CM

## 2021-03-15 NOTE — Patient Instructions (Addendum)
Advise you to get rid of the feather pillows and comforters as this may be causing lung issues.  We will get blood test today for further work-up of interstitial lung disease Schedule pulmonary function test and high-resolution CT Return to clinic after these tests for review and plan for next steps

## 2021-03-15 NOTE — Progress Notes (Signed)
Jeffrey Stewart    [id number]    06/14/1935  Primary Care Physician:Wilson, Jama Flavors, MD  Referring Physician: Pieter Partridge, [place] [address] [place],  [place] [address]  Chief complaint: Consult for interstitial lung disease, abnormal CT scan  HPI: 86 year old with history of CHF, coronary artery disease.   Complains of worsening dyspnea on exertion for the past year.  Has occasional cough with mucus.  Denies any wheezing, fevers, chills He was treated for a community-acquired pneumonia in July 2022.  A follow-up CT showed interstitial changes with basilar fibrosis and mild groundglass opacity in the right upper lobe.  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  No mold, hot tub, Jacuzzi.  Does not recall exposure to asbestos.  He has feather pillows and comforters that he is used for many years Smoking history: Never smoker Travel history: No significant travel history Relevant family history: No family history of lung disease   Outpatient Encounter Medications as of 03/15/2021  Medication Sig   acetaminophen (TYLENOL) 500 MG tablet Take 1,000 mg by mouth every 6 (six) hours as needed for moderate pain.   albuterol (PROVENTIL HFA;VENTOLIN HFA) 108 (90 BASE) MCG/ACT inhaler Inhale 1 puff into the lungs every 6 (six) hours as needed for wheezing.    aspirin EC 81 MG tablet Take 81 mg by mouth every evening.   atorvastatin (LIPITOR) 40 MG tablet TAKE 1 TABLET AT BEDTIME   carvedilol (COREG) 12.5 MG tablet TAKE 1 TABLET TWICE DAILY WITH MEALS   cetirizine (ZYRTEC) 10 MG tablet Take 10 mg by mouth at bedtime.   cholecalciferol (VITAMIN D) 1000 UNITS tablet Take 1,000 Units by mouth daily.   fish oil-omega-3 fatty acids 1000 MG capsule Take 1 g by mouth 2 (two) times daily.    fluticasone (FLONASE) 50 MCG/ACT nasal spray Place 1-2 sprays into both nostrils daily.   Fluticasone-Salmeterol (ADVAIR) 250-50 MCG/DOSE AEPB Inhale 1 puff into the lungs 2 (two) times daily.   furosemide (LASIX) 40 MG tablet TAKE 1 TABLET THREE TIMES WEEKLY ON MONDAY, WEDNESDAY, AND FRIDAY   glucosamine-chondroitin 500-400 MG tablet Take 1 tablet by mouth daily.   lisinopril (ZESTRIL) 2.5 MG tablet Take 1 tablet (2.5 mg total) by mouth daily.   metroNIDAZOLE (METROCREAM) 0.75 % cream Apply 1 application topically 2 (two) times daily.    minocycline (DYNACIN) 50 MG tablet Take 50 mg by mouth 2 (two) times daily.   NEOMYCIN-POLYMYXIN-HYDROCORTISONE (CORTISPORIN) 1 % SOLN OTIC solution Place 1 drop into both ears as needed.   nitroGLYCERIN (NITROSTAT) 0.4 MG SL tablet Place 1 tablet (0.4 mg total) under the tongue every 5 (five) minutes as needed for chest pain.   Polyethyl Glycol-Propyl Glycol 0.4-0.3 % SOLN Place 1 drop into both eyes 2 (two) times daily as needed (dry eyes).   potassium chloride SA (KLOR-CON) 20 MEQ tablet TAKE 1 TABLET EVERY DAY   predniSONE (DELTASONE) 5 MG tablet TAKE 5 MG TABLET BY MOUTH IN THE MORNING AND 1 MG TABLET BY MOUTH AT BEDTIME   sodium chloride (OCEAN) 0.65 % nasal spray Place 1 spray into the nose as needed for congestion.   No facility-administered encounter medications on file as of 03/15/2021.    Allergies as of 03/15/2021 - Review Complete 03/15/2021  Allergen Reaction Noted   Niaspan [niacin]  05/08/2011   Meloxicam  12/20/2020    Past Medical History:  Diagnosis Date   Allergy    Arthritis    MINOR ARTHRITIS FEET AND TOES   Asthma    Cataract    CHF (congestive heart failure) ([place]) 11/2009   EF45-50%   Coronary artery disease    Hyperlipidemia    Myocardial infarction ([place])    Pneumonia    HX OF PNEUMONIA 4 OR 5 YRS AGO   Rosacea    OF FACE   Shortness of breath    SOMETIMES SOB OR WHEEZING WITH EXERTION    Past Surgical History:   Procedure Laterality Date   APPENDECTOMY     AGE 65   CARDIAC CATHETERIZATION  11/2009   mild / mod cad,MODERATE TIGHT STENOSIS IN THE DISTAL LEFT CIRCUMFLEX ARTERY   ELECTROCARDIOGRAM  11/2009   RBBB   EYE SURGERY     BILATERAL CATARACT EXTRACT EXTRACTION WITH IMPLANTS   INGUINAL HERNIA REPAIR Bilateral 09/29/2013   Procedure: LAPAROSCOPIC BILATERAL  INGUINAL HERNIA;  Surgeon: [name], MD;  Location: [facility];  Service: General;  Laterality: Bilateral;   INSERTION OF MESH Bilateral 09/29/2013   Procedure: INSERTION OF MESH;  Surgeon: [name], MD;  Location: [facility];  Service: General;  Laterality: Bilateral;   last echo 02/2009     last nuc 2009  12/16/2007   EF 33%. ABNORMAL. HE HAS APICAL DEFECT CONSISTENT WITH A SCAR.LV FUNCTION MODERATELY  DEPRESSED   US ECHOCARDIOGRAPHY  02/23/2009   EF 45-50%   US ECHOCARDIOGRAPHY  12/27/2007   EF 45-50%    Family History  Problem Relation Age of Onset   Diabetes Sister    Lung cancer Sister        smoker    Social History   Socioeconomic History   Marital status: Married    Spouse name: Not on file   Number of children: Not on file   Years of education: Not on file   Highest education level: Not on file  Occupational History   Not on file  Tobacco Use   Smoking status: Never   Smokeless tobacco: Never  Vaping Use   Vaping Use: Never used  Substance and Sexual Activity   Alcohol use: Not Currently    Comment: 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   Drug use: No   Sexual activity: Not on file  Other Topics Concern   Not on file  Social History Narrative   Not on file   Social Determinants of Health   Financial Resource Strain: Not on file  Food Insecurity: Not on file  Transportation Needs: Not on file  Physical Activity: Not on file  Stress: Not on file  Social Connections: Not on file  Intimate Partner Violence: Not on file    Review of systems: Review of Systems  Constitutional: Negative for fever and chills.   HENT: Negative.   Eyes: Negative for blurred vision.  Respiratory: as per HPI  Cardiovascular: Negative for chest pain and palpitations.  Gastrointestinal: Negative for vomiting, diarrhea, blood per rectum. Genitourinary: Negative for dysuria, urgency, frequency and hematuria.  Musculoskeletal: Negative for myalgias, back pain and joint pain.  Skin: Negative for itching and rash.  Neurological: Negative for dizziness, tremors, focal weakness, seizures and loss of consciousness.  Endo/Heme/Allergies: Negative for environmental allergies.  Psychiatric/Behavioral: Negative for depression, suicidal ideas and hallucinations.  All other systems reviewed and are negative.  Physical  Exam: Blood pressure (!) 144/70, pulse 72, temperature 98 F (36.7 C), temperature source Oral, height 6' (1.829 m), weight 223 lb (101.2 kg), SpO2 97 %. Gen:      No acute distress HEENT:  EOMI, sclera anicteric Neck:     No masses; no thyromegaly Lungs:    Clear to auscultation bilaterally; normal respiratory effort CV:         Regular rate and rhythm; no murmurs Abd:      + bowel sounds; soft, non-tender; no palpable masses, no distension Ext:    No edema; adequate peripheral perfusion Skin:      Warm and dry; no rash Neuro: alert and oriented x 3 Psych: normal mood and affect  Data Reviewed: Imaging: CT abdomen pelvis 08/28/2013-mild reticular changes at the base CT chest 01/31/2021-basal fibrotic changes with mild traction bronchiectasis, scattered groundglass pulmonary infiltrates in the posterior right upper lobe and the left lower lobe. I have reviewed the images personally.  PFTs:  Labs:  Assessment:  Evaluation for interstitial lung disease. I have reviewed his CT scan which shows a basilar pattern of pulmonary fibrosis.  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  He does have scattered groundglass opacities which may be related to the atypical infection that he was treated for around the time  the CT scan was obtained.  He does have ongoing exposure to feather pillows.  Differential diagnosis at this point is chronic hypersensitivity pneumonitis versus IPF I have advised him to get rid of the down exposure which he has agreed to do so In the meantime we will get additional labs for CTD serologies, hypersensitivity panel and a high-resolution CT Schedule PFTs for further evaluation  Plan/Recommendations: Avoid down exposure ILD serologies, PFTs, high-res CT  Marshell Garfinkel MD [facility] 03/15/2021, 10:39 AM  CC: Pieter Partridge, PA

## 2021-03-17 LAB — ANA,IFA RA DIAG PNL W/RFLX TIT/PATN
Anti Nuclear Antibody (ANA): NEGATIVE
Cyclic Citrullin Peptide Ab: <16 UNITS
Rheumatoid fact SerPl-aCnc: <14 IU/mL (ref <14)

## 2021-03-17 LAB — SJOGREN'S SYNDROME ANTIBODS(SSA + SSB)
SSA (Ro) (ENA) Antibody, IgG: <1 AI
SSB (La) (ENA) Antibody, IgG: <1 AI

## 2021-03-17 LAB — ANTI-SCLERODERMA ANTIBODY: Scleroderma (Scl-70) (ENA) Antibody, IgG: <1 AI

## 2021-03-17 LAB — ANCA SCREEN W REFLEX TITER: ANCA Screen: NEGATIVE

## 2021-03-18 LAB — HYPERSENSITIVITY PNEUMONITIS
A. Pullulans Abs: NEGATIVE
A.Fumigatus #1 Abs: NEGATIVE
Micropolyspora faeni, IgG: NEGATIVE
Pigeon Serum Abs: NEGATIVE
Thermoact. Saccharii: NEGATIVE
Thermoactinomyces vulgaris, IgG: NEGATIVE

## 2021-03-29 LAB — MYOMARKER 3 PLUS PROFILE (RDL)
Anti-EJ Ab (RDL): NEGATIVE
Anti-Jo-1 Ab (RDL): <20 Units (ref <20)
Anti-Ku Ab (RDL): NEGATIVE
Anti-MDA-5 Ab (CADM-140)(RDL): <20 Units (ref <20)
Anti-Mi-2 Ab (RDL): NEGATIVE
Anti-NXP-2 (P140) Ab (RDL): <20 Units (ref <20)
Anti-OJ Ab (RDL): NEGATIVE
Anti-PL-12 Ab (RDL: NEGATIVE
Anti-PL-7 Ab (RDL): NEGATIVE
Anti-PM/Scl-100 Ab (RDL): 25 Units — ABNORMAL HIGH (ref <20)
Anti-SAE1 Ab, IgG (RDL): <20 Units (ref <20)
Anti-SRP Ab (RDL): NEGATIVE
Anti-SS-A 52kD Ab, IgG (RDL): <20 Units (ref <20)
Anti-TIF-1gamma Ab (RDL): <20 Units (ref <20)
Anti-U1 RNP Ab (RDL): <20 Units (ref <20)
Anti-U2 RNP Ab (RDL): NEGATIVE
Anti-U3 RNP (Fibrillarin)(RDL): NEGATIVE

## 2021-04-04 ENCOUNTER — Ambulatory Visit
Admission: RE | Admit: 2021-04-04 | Discharge: 2021-04-04 | Disposition: A | Discharge status: Home / Self Care | Payer: Medicare HMO | Source: Ambulatory Visit | Attending: Pulmonary Disease | Admitting: Pulmonary Disease

## 2021-04-04 DIAGNOSIS — J849 Interstitial pulmonary disease, unspecified: Secondary | ICD-10-CM

## 2021-04-06 ENCOUNTER — Other Ambulatory Visit: Payer: Self-pay | Admitting: Cardiovascular Disease

## 2021-04-21 ENCOUNTER — Other Ambulatory Visit: Payer: Self-pay | Admitting: Physician Assistant

## 2021-04-21 DIAGNOSIS — D7589 Other specified diseases of blood and blood-forming organs: Secondary | ICD-10-CM

## 2021-04-22 ENCOUNTER — Inpatient Hospital Stay: Payer: Medicare HMO | Attending: Physician Assistant | Admitting: Physician Assistant

## 2021-04-22 ENCOUNTER — Other Ambulatory Visit: Payer: Self-pay

## 2021-04-22 ENCOUNTER — Inpatient Hospital Stay: Payer: Medicare HMO

## 2021-04-22 VITALS — BP 131/67 | HR 74 | Temp 98.3°F | Resp 17 | Ht 72.0 in | Wt 218.6 lb

## 2021-04-22 DIAGNOSIS — Z7952 Long term (current) use of systemic steroids: Secondary | ICD-10-CM | POA: Diagnosis not present

## 2021-04-22 DIAGNOSIS — D7589 Other specified diseases of blood and blood-forming organs: Secondary | ICD-10-CM

## 2021-04-22 DIAGNOSIS — D696 Thrombocytopenia, unspecified: Secondary | ICD-10-CM | POA: Insufficient documentation

## 2021-04-22 DIAGNOSIS — D72829 Elevated white blood cell count, unspecified: Secondary | ICD-10-CM | POA: Insufficient documentation

## 2021-04-22 LAB — CBC WITH DIFFERENTIAL (CANCER CENTER ONLY)
Abs Immature Granulocytes: 0.15 10*3/uL — ABNORMAL HIGH (ref 0.00–0.07)
Basophils Absolute: 0 10*3/uL (ref 0.0–0.1)
Basophils Relative: 0 %
Eosinophils Absolute: 0 10*3/uL (ref 0.0–0.5)
Eosinophils Relative: 0 %
HCT: 45.6 % (ref 39.0–52.0)
Hemoglobin: 15.3 g/dL (ref 13.0–17.0)
Immature Granulocytes: 1 %
Lymphocytes Relative: 14 %
Lymphs Abs: 1.7 10*3/uL (ref 0.7–4.0)
MCH: 35 pg — ABNORMAL HIGH (ref 26.0–34.0)
MCHC: 33.6 g/dL (ref 30.0–36.0)
MCV: 104.3 fL — ABNORMAL HIGH (ref 80.0–100.0)
Monocytes Absolute: 0.8 10*3/uL (ref 0.1–1.0)
Monocytes Relative: 7 %
Neutro Abs: 9 10*3/uL — ABNORMAL HIGH (ref 1.7–7.7)
Neutrophils Relative %: 78 %
Platelet Count: 139 10*3/uL — ABNORMAL LOW (ref 150–400)
RBC: 4.37 MIL/uL (ref 4.22–5.81)
RDW: 14.5 % (ref 11.5–15.5)
WBC Count: 11.7 10*3/uL — ABNORMAL HIGH (ref 4.0–10.5)
nRBC: 0 % (ref 0.0–0.2)

## 2021-04-22 LAB — CMP (CANCER CENTER ONLY)
ALT: 22 U/L (ref 0–44)
AST: 17 U/L (ref 15–41)
Albumin: 3.8 g/dL (ref 3.5–5.0)
Alkaline Phosphatase: 82 U/L (ref 38–126)
Anion gap: 7 (ref 5–15)
BUN: 25 mg/dL — ABNORMAL HIGH (ref 8–23)
CO2: 32 mmol/L (ref 22–32)
Calcium: 9.2 mg/dL (ref 8.9–10.3)
Chloride: 100 mmol/L (ref 98–111)
Creatinine: 1.2 mg/dL (ref 0.61–1.24)
GFR, Estimated: 60 mL/min — ABNORMAL LOW (ref >60)
Glucose, Bld: 115 mg/dL — ABNORMAL HIGH (ref 70–99)
Potassium: 3.8 mmol/L (ref 3.5–5.1)
Sodium: 139 mmol/L (ref 135–145)
Total Bilirubin: 1.1 mg/dL (ref 0.3–1.2)
Total Protein: 6.4 g/dL — ABNORMAL LOW (ref 6.5–8.1)

## 2021-04-25 DIAGNOSIS — D696 Thrombocytopenia, unspecified: Secondary | ICD-10-CM | POA: Insufficient documentation

## 2021-04-25 NOTE — Progress Notes (Signed)
[place] [phone]   [phone]) [phone]  PROGRESS NOTE  Patient Care Team: Christain Sacramento, MD as PCP - General (Family Medicine) Nahser, Wonda Cheng, MD as PCP - Cardiology (Cardiology) Fanny Skates, MD as Consulting Physician (General Surgery)  Hematological/Oncological History 1) Labs from PCP, Tia McPhatter: -12/29/2020: WBC 9.8, Hgb 14.4, MCV 103.7, Plt 168,  Band Abs 0.8 (H), Myelo Abs 0.4 (H) -01/06/2021: WBC 9.8, Hgb 14.2, MCV 103.4, Plt 207, Mono Manuel Ab 1.1 (H), Meta Absolute 0.2 (H)  2) 01/21/2021: Establish care with Dede Query PA-C  CHIEF COMPLAINTS/PURPOSE OF CONSULTATION:  -Elevated metamyelocytes -Thrombocytopenia -Macrocytosis  HISTORY OF PRESENTING ILLNESS:  Jeffrey Stewart 86 y.o. male returns for a follow up. Patient is accompanied by his wife for this visit. Patient reports having recurrent pneumonia, most recently diagnosed on 04/04/2021. He was treated with cefpodoxime x 10 days and prednisone x 5 days.  Today, Ms. Talwar reports feeling better with improvement of shortness of breath and wheezing. He has a mild cough and feels fatigued but overall feels better. He has a good appetite. He denies any nausea, vomiting or abdominal pain. His bowel movements are regular. He has hemorrhoids with occasional episodes of blood in the stool. He has no other signs of bleeding.  He denies any fevers, chills, night sweats, chest pain or cough. He has no other complaints. Rest of the 10 point ROS is below.   MEDICAL HISTORY:  Past Medical History:  Diagnosis Date   Allergy    Arthritis    MINOR ARTHRITIS FEET AND TOES   Asthma    Cataract    CHF (congestive heart failure) ([place]) 11/2009   EF45-50%   Coronary artery disease    Hyperlipidemia    Myocardial infarction ([place])    Pneumonia    HX OF PNEUMONIA 4 OR 5 YRS AGO   Rosacea    OF FACE   Shortness of breath    SOMETIMES SOB OR WHEEZING WITH EXERTION    SURGICAL HISTORY: Past  Surgical History:  Procedure Laterality Date   APPENDECTOMY     AGE 71   CARDIAC CATHETERIZATION  11/2009   mild / mod cad,MODERATE TIGHT STENOSIS IN THE DISTAL LEFT CIRCUMFLEX ARTERY   ELECTROCARDIOGRAM  11/2009   RBBB   EYE SURGERY     BILATERAL CATARACT EXTRACT EXTRACTION WITH IMPLANTS   INGUINAL HERNIA REPAIR Bilateral 09/29/2013   Procedure: LAPAROSCOPIC BILATERAL  INGUINAL HERNIA;  Surgeon: [name], MD;  [facility];  Service: General;  Laterality: Bilateral;   INSERTION OF MESH Bilateral 09/29/2013   Procedure: INSERTION OF MESH;  Surgeon: [name], MD;  Location: [facility];  Service: General;  Laterality: Bilateral;   last echo 02/2009     last nuc 2009  12/16/2007   EF 33%. ABNORMAL. HE HAS APICAL DEFECT CONSISTENT WITH A SCAR.LV FUNCTION MODERATELY  DEPRESSED   US ECHOCARDIOGRAPHY  02/23/2009   EF 45-50%   US ECHOCARDIOGRAPHY  12/27/2007   EF 45-50%    SOCIAL HISTORY: Social History   Socioeconomic History   Marital status: Married    Spouse name: Not on file   Number of children: Not on file   Years of education: Not on file   Highest education level: Not on file  Occupational History   Not on file  Tobacco Use   Smoking status: Never   Smokeless tobacco: Never  Vaping Use   Vaping Use: Never used  Substance and Sexual Activity  Alcohol use: Not Currently    Comment: 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   Drug use: No   Sexual activity: Not on file  Other Topics Concern   Not on file  Social History Narrative   Not on file   Social Determinants of Health   Financial Resource Strain: Not on file  Food Insecurity: Not on file  Transportation Needs: Not on file  Physical Activity: Not on file  Stress: Not on file  Social Connections: Not on file  Intimate Partner Violence: Not on file    FAMILY HISTORY: Family History  Problem Relation Age of Onset   Diabetes Sister    Lung cancer Sister        smoker    ALLERGIES:  is allergic to niaspan  [niacin] and meloxicam.  MEDICATIONS:  Current Outpatient Medications  Medication Sig Dispense Refill   acetaminophen (TYLENOL) 500 MG tablet Take 1,000 mg by mouth every 6 (six) hours as needed for moderate pain.     albuterol (PROVENTIL HFA;VENTOLIN HFA) 108 (90 BASE) MCG/ACT inhaler Inhale 1 puff into the lungs every 6 (six) hours as needed for wheezing.      aspirin EC 81 MG tablet Take 81 mg by mouth every evening.     atorvastatin (LIPITOR) 40 MG tablet TAKE 1 TABLET AT BEDTIME 90 tablet 3   carvedilol (COREG) 12.5 MG tablet TAKE 1 TABLET TWICE DAILY WITH MEALS 180 tablet 1   cetirizine (ZYRTEC) 10 MG tablet Take 10 mg by mouth at bedtime.     cholecalciferol (VITAMIN D) 1000 UNITS tablet Take 1,000 Units by mouth daily.     fish oil-omega-3 fatty acids 1000 MG capsule Take 1 g by mouth 2 (two) times daily.     fluticasone (FLONASE) 50 MCG/ACT nasal spray Place 1-2 sprays into both nostrils daily.     Fluticasone-Salmeterol (ADVAIR) 250-50 MCG/DOSE AEPB Inhale 1 puff into the lungs 2 (two) times daily.     furosemide (LASIX) 40 MG tablet TAKE 1 TABLET THREE TIMES WEEKLY ON MONDAY, WEDNESDAY, AND FRIDAY 45 tablet 3   glucosamine-chondroitin 500-400 MG tablet Take 1 tablet by mouth daily.     lisinopril (ZESTRIL) 2.5 MG tablet Take 1 tablet (2.5 mg total) by mouth daily. 90 tablet 0   metroNIDAZOLE (METROCREAM) 0.75 % cream Apply 1 application topically 2 (two) times daily.      minocycline (DYNACIN) 50 MG tablet Take 50 mg by mouth 2 (two) times daily.     NEOMYCIN-POLYMYXIN-HYDROCORTISONE (CORTISPORIN) 1 % SOLN OTIC solution Place 1 drop into both ears as needed.     nitroGLYCERIN (NITROSTAT) 0.4 MG SL tablet Place 1 tablet (0.4 mg total) under the tongue every 5 (five) minutes as needed for chest pain. 25 tablet 5   Polyethyl Glycol-Propyl Glycol 0.4-0.3 % SOLN Place 1 drop into both eyes 2 (two) times daily as needed (dry eyes).     potassium chloride SA (KLOR-CON) 20 MEQ tablet TAKE 1  TABLET EVERY DAY 90 tablet 1   predniSONE (DELTASONE) 5 MG tablet TAKE 5 MG TABLET BY MOUTH IN THE MORNING AND 1 MG TABLET BY MOUTH AT BEDTIME     sodium chloride (OCEAN) 0.65 % nasal spray Place 1 spray into the nose as needed for congestion.     No current facility-administered medications for this visit.    REVIEW OF SYSTEMS:   Constitutional: ( - ) fevers, ( - )  chills , ( - ) night sweats Eyes: ( - )  blurriness of vision, ( - ) double vision, ( - ) watery eyes Ears, nose, mouth, throat, and face: ( - ) mucositis, ( - ) sore throat Respiratory: ( + ) cough, ( + ) dyspnea improved, ( +) wheezes impoved Cardiovascular: ( - ) palpitation, ( - ) chest discomfort, ( + ) lower extremity swelling Gastrointestinal:  ( - ) nausea, ( - ) heartburn, ( - ) change in bowel habits Skin: ( - ) abnormal skin rashes Lymphatics: ( - ) new lymphadenopathy, ( - ) easy bruising Neurological: ( - ) numbness, ( - ) tingling, ( - ) new weaknesses Behavioral/Psych: ( - ) mood change, ( - ) new changes  All other systems were reviewed with the patient and are negative.  PHYSICAL EXAMINATION: ECOG PERFORMANCE STATUS: 1 - Symptomatic but completely ambulatory  Vitals:   04/22/21 1543  BP: 131/67  Pulse: 74  Resp: 17  Temp: 98.3 F (36.8 C)  SpO2: 99%   Filed Weights   04/22/21 1543  Weight: 218 lb 9.6 oz (99.2 kg)    GENERAL: well appearing male in NAD  SKIN: skin color, texture, turgor are normal, no rashes or significant lesions EYES: conjunctiva are pink and non-injected, sclera clear OROPHARYNX: no exudate, no erythema; lips, buccal mucosa, and tongue normal  LUNGS: clear to auscultation and percussion with normal breathing effort HEART: regular rate & rhythm and no murmurs. 2+ pitting edema in ankles and distal lower leg bilaterally.  ABDOMEN: soft, non-tender, non-distended, normal bowel sounds Musculoskeletal: no cyanosis of digits and no clubbing  PSYCH: alert & oriented x 3, fluent  speech NEURO: no focal motor/sensory deficits  LABORATORY DATA:  I have reviewed the data as listed CBC Latest Ref Rng & Units 04/22/2021 01/21/2021 09/24/2013  WBC 4.0 - 10.5 K/uL 11.7(H) 9.5 6.8  Hemoglobin 13.0 - 17.0 g/dL 15.3 13.9 15.6  Hematocrit 39.0 - 52.0 % 45.6 40.2 45.1  Platelets 150 - 400 K/uL 139(L) 127(L) 123(L)    CMP Latest Ref Rng & Units 04/22/2021 01/21/2021 07/19/2020  Glucose 70 - 99 mg/dL 115(H) 110(H) 103(H)  BUN 8 - 23 mg/dL 25(H) 17 21  Creatinine 0.61 - 1.24 mg/dL 1.20 0.95 1.12  Sodium 135 - 145 mmol/L 139 135 136  Potassium 3.5 - 5.1 mmol/L 3.8 4.6 4.9  Chloride 98 - 111 mmol/L 100 101 100  CO2 22 - 32 mmol/L 32 27 26  Calcium 8.9 - 10.3 mg/dL 9.2 9.1 9.0  Total Protein 6.5 - 8.1 g/dL 6.4(L) 5.9(L) 5.9(L)  Total Bilirubin 0.3 - 1.2 mg/dL 1.1 1.3(H) 1.4(H)  Alkaline Phos 38 - 126 U/L 82 56 73  AST 15 - 41 U/L _0 ALT 0 - 44 U/L _1 ASSESSMENT & PLAN THADD APUZZO is a 86 y.o. male that presents to the clinic for a follow up for the following:  #Hx of metamyelocytes: --Likely secondary to recurrent pneumonia infection.  --Monitor for now.   #Leukocytosis, neutrophil predominant: --Likely secondary to recent pneumonia diagnosis  --Patient was prescribed prednisone x 5 days --He denies any signs of active infection at this time.  --Monitor for now.   #Macrocytosis: --Etiology unknown --Patient is not taking any medication that could cause macrocytosis --Patient has not consumed alcohol recently.  --Labs from 01/21/2021 showed no evidence of vitamin B12 or folate deficiency.  --Consider bone marrow biopsy if patient becomes anemic.   #Thrombocytopenia: -Mild and improving from last visit three months ago -Record  review shows CT scan from 08/28/2013 that shows mild splenomegaly.  -Monitor for now as levels are improving and consider further workup (hepatitis B/C, HIV, abdominal US) if levels worsen.   Follow up:  Return appt with labs in  6 months.    No orders of the defined types were placed in this encounter.   All questions were answered. The patient knows to call the clinic with any problems, questions or concerns.  I have spent a total of 25 minutes minutes of face-to-face and non-face-to-face time, preparing to see the patient, performing a medically appropriate examination, counseling and educating the patient, documenting clinical information in the electronic health record,and care coordination.   Dede Query, PA-C Department of Hematology/Oncology [place] [facility] Phone: [phone]

## 2021-04-28 ENCOUNTER — Ambulatory Visit: Payer: Medicare HMO | Admitting: Pulmonary Disease

## 2021-05-06 ENCOUNTER — Ambulatory Visit (INDEPENDENT_AMBULATORY_CARE_PROVIDER_SITE_OTHER): Payer: Medicare HMO | Admitting: Pulmonary Disease

## 2021-05-06 ENCOUNTER — Other Ambulatory Visit: Payer: Self-pay

## 2021-05-06 ENCOUNTER — Ambulatory Visit: Payer: Medicare HMO | Admitting: Pulmonary Disease

## 2021-05-06 ENCOUNTER — Encounter: Payer: Self-pay | Admitting: Pulmonary Disease

## 2021-05-06 VITALS — BP 140/76 | HR 82 | Temp 98.5°F | Ht 71.0 in | Wt 219.4 lb

## 2021-05-06 DIAGNOSIS — J849 Interstitial pulmonary disease, unspecified: Secondary | ICD-10-CM | POA: Diagnosis not present

## 2021-05-06 LAB — PULMONARY FUNCTION TEST
DL/VA % pred: 89 %
DL/VA: 3.42 ml/min/mmHg/L
DLCO cor % pred: 51 %
DLCO cor: 12.67 ml/min/mmHg
DLCO unc % pred: 52 %
DLCO unc: 12.91 ml/min/mmHg
FEF 25-75 Post: 1.22 L/sec
FEF 25-75 Pre: 0.86 L/sec
FEF2575-%Change-Post: 42 %
FEF2575-%Pred-Post: 65 %
FEF2575-%Pred-Pre: 45 %
FEV1-%Change-Post: 8 %
FEV1-%Pred-Post: 60 %
FEV1-%Pred-Pre: 55 %
FEV1-Post: 1.7 L
FEV1-Pre: 1.57 L
FEV1FVC-%Change-Post: 3 %
FEV1FVC-%Pred-Pre: 97 %
FEV6-%Change-Post: 6 %
FEV6-%Pred-Post: 63 %
FEV6-%Pred-Pre: 59 %
FEV6-Post: 2.37 L
FEV6-Pre: 2.24 L
FEV6FVC-%Change-Post: 1 %
FEV6FVC-%Pred-Post: 106 %
FEV6FVC-%Pred-Pre: 105 %
FVC-%Change-Post: 4 %
FVC-%Pred-Post: 59 %
FVC-%Pred-Pre: 56 %
FVC-Post: 2.39 L
FVC-Pre: 2.28 L
Post FEV1/FVC ratio: 71 %
Post FEV6/FVC ratio: 99 %
Pre FEV1/FVC ratio: 69 %
Pre FEV6/FVC Ratio: 98 %
RV % pred: 83 %
RV: 2.32 L
TLC % pred: 64 %
TLC: 4.67 L

## 2021-05-06 NOTE — Progress Notes (Signed)
Jeffrey Stewart    [id number]    05-30-1935  Primary Care Physician:Wilson, Jama Flavors, MD  Referring Physician: Christain Sacramento, MD [address] [place],  [place] [address]  Chief complaint: Follow-up for interstitial lung disease, abnormal CT scan  HPI: 86 year old with history of CHF, coronary artery disease.   Complains of worsening dyspnea on exertion for the past year.  Has occasional cough with mucus.  Denies any wheezing, fevers, chills He was treated for a community-acquired pneumonia in July 2022.  A follow-up CT showed interstitial changes with basilar fibrosis and mild groundglass opacity in the right upper lobe.  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  No mold, hot tub, Jacuzzi.  Does not recall exposure to asbestos.  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  He also had right upper lobe infiltrate and possible cavitary lesion for which he was treated with cefpodoxime and prednisone  Overall states that his breathing is improved He is here for review of PFTs.  Outpatient Encounter Medications as of 05/06/2021  Medication Sig   acetaminophen (TYLENOL) 500 MG tablet Take 1,000 mg by mouth every 6 (six) hours as needed for moderate pain.   albuterol (PROVENTIL HFA;VENTOLIN HFA) 108 (90 BASE) MCG/ACT inhaler Inhale 1 puff into the lungs every 6 (six) hours as needed for wheezing.    aspirin EC 81 MG tablet Take 81 mg by mouth every evening.   atorvastatin (LIPITOR) 40 MG tablet TAKE 1 TABLET AT BEDTIME    carvedilol (COREG) 12.5 MG tablet TAKE 1 TABLET TWICE DAILY WITH MEALS   cetirizine (ZYRTEC) 10 MG tablet Take 10 mg by mouth at bedtime.   cholecalciferol (VITAMIN D) 1000 UNITS tablet Take 1,000 Units by mouth daily.   fish oil-omega-3 fatty acids 1000 MG capsule Take 1 g by mouth 2 (two) times daily.   fluticasone (FLONASE) 50 MCG/ACT nasal spray Place 1-2 sprays into both nostrils daily.   Fluticasone-Salmeterol (ADVAIR) 250-50 MCG/DOSE AEPB Inhale 1 puff into the lungs 2 (two) times daily.   furosemide (LASIX) 40 MG tablet TAKE 1 TABLET THREE TIMES WEEKLY ON MONDAY, WEDNESDAY, AND FRIDAY   glucosamine-chondroitin 500-400 MG tablet Take 1 tablet by mouth daily.   lisinopril (ZESTRIL) 2.5 MG tablet Take 1 tablet (2.5 mg total) by mouth daily.   metroNIDAZOLE (METROCREAM) 0.75 % cream Apply 1 application topically 2 (two) times daily.    minocycline (DYNACIN) 50 MG tablet Take 50 mg by mouth 2 (two) times daily.   NEOMYCIN-POLYMYXIN-HYDROCORTISONE (CORTISPORIN) 1 % SOLN OTIC solution Place 1 drop into both ears as needed.   nitroGLYCERIN (NITROSTAT) 0.4 MG SL tablet Place 1 tablet (0.4 mg total) under the tongue every 5 (five) minutes as needed for chest pain.   Polyethyl Glycol-Propyl Glycol 0.4-0.3 % SOLN Place 1 drop into both eyes 2 (two) times daily as needed (dry eyes).   potassium chloride SA (KLOR-CON) 20 MEQ tablet TAKE 1 TABLET EVERY DAY   predniSONE (DELTASONE) 5 MG tablet TAKE 5 MG TABLET BY MOUTH IN  THE MORNING AND 1 MG TABLET BY MOUTH AT BEDTIME   sodium chloride (OCEAN) 0.65 % nasal spray Place 1 spray into the nose as needed for congestion.   No facility-administered encounter medications on file as of 05/06/2021.    Physical Exam: Blood pressure 140/76, pulse 82, temperature 98.5 F (36.9 C), temperature source Oral, height 5\' 11"  (1.803 m), weight 219 lb 6.4 oz (99.5 kg), SpO2 98 %. Gen:      No acute distress HEENT:  EOMI, sclera anicteric Neck:     No masses; no  thyromegaly Lungs:    Clear to auscultation bilaterally; normal respiratory effort CV:         Regular rate and rhythm; no murmurs Abd:      + bowel sounds; soft, non-tender; no palpable masses, no distension Ext:    No edema; adequate peripheral perfusion Skin:      Warm and dry; no rash Neuro: alert and oriented x 3 Psych: normal mood and affect   Data Reviewed: Imaging: CT abdomen pelvis 08/28/2013-mild reticular changes at the base CT chest 01/31/2021-basal fibrotic changes with mild traction bronchiectasis, scattered groundglass pulmonary infiltrates in the posterior right upper lobe and the left lower lobe. 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  Probable bronchopneumonia in the right upper lobe with areas of consolidation and pre-existing region interstitial lung disease I have reviewed the images personally.  PFTs: 05/06/2021 FVC 2.39 (59%), FEV1 1.70 60%, F/F 71, TLC 4.67 64%, DLCO 12.91 52% Severe restriction, diffusion defect  Labs: CTD serologies 03/15/2021-  SCL 100- 25  Assessment:  Evaluation for interstitial lung disease. I have reviewed his CT scan which shows a basilar pattern of pulmonary fibrosis.  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    Differential diagnosis at this point is chronic hypersensitivity pneumonitis versus IPF He has gotten rid of his down comforter and pillows since September 2022 CTD serologies are negative borderline elevation in SCL 100 which is likely nonspecific as he does not have any symptoms PFTs reviewed with restriction and diffusion defect  Will monitor closely for progression of disease now that he does not have any ongoing exposures.  If there is progression in spite of this then we will start him on antifibrotics  Bronchopneumonia Noted on CT scan in September He has been treated with antibiotics and prednisone Schedule follow-up CT in 3 months to ensure  resolution  Plan/Recommendations: Avoid down exposure Follow-up CT in 3 months  Marshell Garfinkel MD [facility] 05/06/2021, 12:16 PM  CC: Christain Sacramento, MD

## 2021-05-06 NOTE — Progress Notes (Signed)
PFT done today. 

## 2021-05-06 NOTE — Patient Instructions (Signed)
I have reviewed your CT scan which does show some mild scarring in the lung It also shows evidence of pneumonia for which you already got antibiotics Your lung function test shows a small decline in lung function  We will continue to monitor this closely  Order repeat high-res CT in 3 months and return to clinic

## 2021-05-09 ENCOUNTER — Telehealth: Payer: Self-pay | Admitting: Pulmonary Disease

## 2021-05-09 MED ORDER — MOLNUPIRAVIR EUA 200MG CAPSULE: 4.0000 | ORAL_CAPSULE | Freq: Two times a day (BID) | ORAL | 0 refills | Status: AC

## 2021-05-09 NOTE — Telephone Encounter (Signed)
Called and spoke with Patient.  Patient stated he has tested positive for covid Sunday 05/08/21. Patient stated his wife tested positive Saturday and his symptoms started Sunday.  Patient stated he is having a dry cough, head congestion,and  feels like his chest has some congestion. Patient stated sob is the same as his normal.  Patient stated his temp was 101 yesterday and Patient has been taking Tyenol, so no temp since.  Patient stated any prescriptions can go to [facility].   Message routed to Dr. Vaughan Browner to advise

## 2021-05-09 NOTE — Telephone Encounter (Signed)
Spoke with pt and notified him of Molnupiravir order had been placed. Pt stated understanding. Pt also advised if symptoms become worse to seek emergent medical evaluation. Nothing further needed at this time.

## 2021-05-09 NOTE — Telephone Encounter (Signed)
I send a prescription for anti covid pills Molnupiravir to his pharmacy

## 2021-05-23 ENCOUNTER — Telehealth: Payer: Self-pay | Admitting: Pulmonary Disease

## 2021-05-23 MED ORDER — AZITHROMYCIN 250 MG PO TABS: 250.0000 mg | ORAL_TABLET | ORAL | 0 refills | Status: DC

## 2021-05-23 MED ORDER — PREDNISONE 20 MG PO TABS: 40.0000 mg | ORAL_TABLET | Freq: Every day | ORAL | 0 refills | Status: DC

## 2021-05-23 NOTE — Telephone Encounter (Signed)
Spoke with the pt's spouse and notified of response per Dr Vaughan Browner She verbalized understanding  Rxs sent to [facility]

## 2021-05-23 NOTE — Telephone Encounter (Signed)
Call in Z-Pak and prednisone 40 mg a day for 5 days.  After that he can go back to his regular 6 mg of prednisone If symptoms continue then he will need to come in for chest x-ray and evaluation

## 2021-05-23 NOTE — Telephone Encounter (Signed)
Spoke with the pt's spouse  Pt having increased cough x 1 wk= prod with large amounts of cream colored sputum  He is also having increased wheezing and SOB  He is on 6 mg pred for his arthritis  He is using honey to help with cough but no meds specifically for cough  He tried using his albuterol today and this did not help  Not having any fevers or aches  He takes advair 250 bid  Please advise thanks   Allergies  Allergen Reactions   Niaspan [Niacin]     Intolerance  Flush feeling WHEN ON LARGE DOSE - IS ABLE TO TAKE SMALLER DOSAGE WITHOUT PROB   Meloxicam     Disorientation

## 2021-06-30 ENCOUNTER — Other Ambulatory Visit: Payer: Self-pay | Admitting: Cardiovascular Disease

## 2021-07-28 ENCOUNTER — Ambulatory Visit
Admission: RE | Admit: 2021-07-28 | Discharge: 2021-07-28 | Disposition: A | Discharge status: Home / Self Care | Payer: Medicare HMO | Source: Ambulatory Visit | Attending: Pulmonary Disease | Admitting: Pulmonary Disease

## 2021-07-28 DIAGNOSIS — J849 Interstitial pulmonary disease, unspecified: Secondary | ICD-10-CM

## 2021-08-02 ENCOUNTER — Encounter: Payer: Self-pay | Admitting: Pulmonary Disease

## 2021-08-02 ENCOUNTER — Other Ambulatory Visit: Payer: Self-pay

## 2021-08-02 ENCOUNTER — Ambulatory Visit: Payer: Medicare HMO | Admitting: Pulmonary Disease

## 2021-08-02 ENCOUNTER — Telehealth: Payer: Self-pay | Admitting: Cardiovascular Disease

## 2021-08-02 VITALS — BP 118/60 | HR 78 | Temp 97.9°F | Ht 72.0 in | Wt 223.0 lb

## 2021-08-02 DIAGNOSIS — R0602 Shortness of breath: Secondary | ICD-10-CM

## 2021-08-02 DIAGNOSIS — J849 Interstitial pulmonary disease, unspecified: Secondary | ICD-10-CM

## 2021-08-02 LAB — CBC WITH DIFFERENTIAL/PLATELET
Basophils Absolute: 0 10*3/uL (ref 0.0–0.1)
Basophils Relative: 0.2 % (ref 0.0–3.0)
Eosinophils Absolute: 0.2 10*3/uL (ref 0.0–0.7)
Eosinophils Relative: 1.8 % (ref 0.0–5.0)
HCT: 39.8 % (ref 39.0–52.0)
Hemoglobin: 13.1 g/dL (ref 13.0–17.0)
Lymphocytes Relative: 15.6 % (ref 12.0–46.0)
Lymphs Abs: 1.4 10*3/uL (ref 0.7–4.0)
MCHC: 32.9 g/dL (ref 30.0–36.0)
MCV: 101.7 fl — ABNORMAL HIGH (ref 78.0–100.0)
Monocytes Absolute: 0.6 10*3/uL (ref 0.1–1.0)
Monocytes Relative: 6.1 % (ref 3.0–12.0)
Neutro Abs: 7.1 10*3/uL (ref 1.4–7.7)
Neutrophils Relative %: 76.3 % (ref 43.0–77.0)
Platelets: 151 10*3/uL (ref 150.0–400.0)
RBC: 3.92 Mil/uL — ABNORMAL LOW (ref 4.22–5.81)
RDW: 15.8 % — ABNORMAL HIGH (ref 11.5–15.5)
WBC: 9.3 10*3/uL (ref 4.0–10.5)

## 2021-08-02 LAB — COMPREHENSIVE METABOLIC PANEL
ALT: 11 U/L (ref 0–53)
AST: 15 U/L (ref 0–37)
Albumin: 4 g/dL (ref 3.5–5.2)
Alkaline Phosphatase: 55 U/L (ref 39–117)
BUN: 28 mg/dL — ABNORMAL HIGH (ref 6–23)
CO2: 33 mEq/L — ABNORMAL HIGH (ref 19–32)
Calcium: 9.6 mg/dL (ref 8.4–10.5)
Chloride: 101 mEq/L (ref 96–112)
Creatinine, Ser: 1.2 mg/dL (ref 0.40–1.50)
GFR: 55.59 mL/min — ABNORMAL LOW (ref >60.00)
Glucose, Bld: 103 mg/dL — ABNORMAL HIGH (ref 70–99)
Potassium: 4.5 mEq/L (ref 3.5–5.1)
Sodium: 139 mEq/L (ref 135–145)
Total Bilirubin: 0.9 mg/dL (ref 0.2–1.2)
Total Protein: 6.3 g/dL (ref 6.0–8.3)

## 2021-08-02 NOTE — Patient Instructions (Addendum)
I have reviewed his CT scan which shows stable scarring of the lung We get records from Dr. Melissa Noon office regarding your recent treatment Increase Lasix to 40 mg twice daily for 1 week to help with the swelling of the legs We will check some labs including comprehensive metabolic panel, CBC and proBNP for dyspnea Please follow-up with Dr. Katharina Caper for better management of heart failure  Follow-up in 3 months

## 2021-08-02 NOTE — Progress Notes (Signed)
Jeffrey Stewart    [id number]    1934/10/13  Primary Care Physician:Williams, Gracelyn Nurse, PA-C  Referring Physician: Christain Sacramento, MD [address] [place],  [place] [address]  Chief complaint: Follow-up for interstitial lung disease, abnormal CT scan  HPI: 87 year old with history of CHF, coronary artery disease.   Complains of worsening dyspnea on exertion for the past year.  Has occasional cough with mucus.  Denies any wheezing, fevers, chills He was treated for a community-acquired pneumonia in July 2022.  A follow-up CT showed interstitial changes with basilar fibrosis and mild groundglass opacity in the right upper lobe.  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  No mold, hot tub, Jacuzzi.  Does not recall exposure to asbestos.  He has feather pillows and comforters that he is used for many years Smoking history: Never smoker Travel history: No significant travel history Relevant family history: No family history of lung disease  Interval history: Developed COVID-19 and November 2022 treated with molnupiravir.  He also got a Z-Pak and prednisone around that time States that breathing is stable.  Chief complaint is lower extremity swelling.  He is following with Dr. Amil Amen, rheumatology for arthritis of unclear etiology.  He is on chronic prednisone at 6 mg from rheumatology  Outpatient Encounter Medications as of 08/02/2021  Medication Sig   acetaminophen (TYLENOL) 500 MG tablet Take 1,000 mg by mouth every 6 (six) hours as needed for moderate pain.   albuterol (PROVENTIL HFA;VENTOLIN HFA) 108 (90 BASE) MCG/ACT inhaler Inhale 1 puff into the lungs every 6 (six) hours as needed for wheezing.    aspirin EC 81 MG tablet Take 81 mg by mouth every evening.   atorvastatin  (LIPITOR) 40 MG tablet TAKE 1 TABLET AT BEDTIME   carvedilol (COREG) 12.5 MG tablet TAKE 1 TABLET TWICE DAILY WITH MEALS   cetirizine (ZYRTEC) 10 MG tablet Take 10 mg by mouth at bedtime.   cholecalciferol (VITAMIN D) 1000 UNITS tablet Take 1,000 Units by mouth daily.   fish oil-omega-3 fatty acids 1000 MG capsule Take 1 g by mouth 2 (two) times daily.   fluticasone (FLONASE) 50 MCG/ACT nasal spray Place 1-2 sprays into both nostrils daily.   Fluticasone-Salmeterol (ADVAIR) 250-50 MCG/DOSE AEPB Inhale 1 puff into the lungs 2 (two) times daily.   furosemide (LASIX) 40 MG tablet TAKE 1 TABLET THREE TIMES WEEKLY ON MONDAY, WEDNESDAY, AND FRIDAY (Patient taking differently: Take 40 mg by mouth daily. Taking daily)   glucosamine-chondroitin 500-400 MG tablet Take 1 tablet by mouth daily.   lisinopril (ZESTRIL) 2.5 MG tablet Take 1 tablet (2.5 mg total) by mouth daily.   metroNIDAZOLE (METROCREAM) 0.75 % cream Apply 1 application topically 2 (two) times daily.    minocycline (DYNACIN) 50 MG tablet Take 50 mg by mouth 2 (two) times daily.   NEOMYCIN-POLYMYXIN-HYDROCORTISONE (CORTISPORIN) 1 % SOLN OTIC solution Place 1 drop into both ears as needed.   nitroGLYCERIN (NITROSTAT) 0.4 MG SL tablet Place 1 tablet (0.4 mg total) under the tongue every 5 (five) minutes as needed for chest pain.   Polyethyl Glycol-Propyl Glycol 0.4-0.3 % SOLN Place 1 drop into both eyes 2 (two) times daily as needed (dry eyes).   potassium chloride SA (KLOR-CON) 20 MEQ tablet TAKE 1 TABLET EVERY  DAY   predniSONE (DELTASONE) 5 MG tablet TAKE 5 MG TABLET BY MOUTH IN THE MORNING AND 1 MG TABLET BY MOUTH AT BEDTIME   sodium chloride (OCEAN) 0.65 % nasal spray Place 1 spray into the nose as needed for congestion.   [DISCONTINUED] azithromycin (ZITHROMAX) 250 MG tablet Take 1 tablet (250 mg total) by mouth as directed.   [DISCONTINUED] predniSONE (DELTASONE) 20 MG tablet Take 2 tablets (40 mg total) by mouth daily with breakfast.    No facility-administered encounter medications on file as of 08/02/2021.    Physical Exam: Blood pressure 118/60, pulse 78, temperature 97.9 F (36.6 C), temperature source Oral, height 6' (1.829 m), weight 223 lb (101.2 kg), SpO2 100 %. Gen:      No acute distress HEENT:  EOMI, sclera anicteric Neck:     No masses; no thyromegaly Lungs:    Clear to auscultation bilaterally; normal respiratory effort CV:         Regular rate and rhythm; no murmurs Abd:      + bowel sounds; soft, non-tender; no palpable masses, no distension Ext:    2+ edema; adequate peripheral perfusion Skin:      Warm and dry; no rash Neuro: alert and oriented x 3 Psych: normal mood and affect   Data Reviewed: Imaging: CT abdomen pelvis 08/28/2013-mild reticular changes at the base CT chest 01/31/2021-basal fibrotic changes with mild traction bronchiectasis, scattered groundglass pulmonary infiltrates in the posterior right upper lobe and the left lower lobe. 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  Probable bronchopneumonia in the right upper lobe with areas of consolidation and pre-existing region interstitial lung disease High-resolution CT 07/28/2021-improvement in inflammatory changes of pneumonia.  Stable probable UIP pattern pulmonary fibrosis I have reviewed the images personally.  PFTs: 05/06/2021 FVC 2.39 (59%), FEV1 1.70 60%, F/F 71, TLC 4.67 64%, DLCO 12.91 52% Severe restriction, diffusion defect  Labs: CTD serologies 03/15/2021-  SCL 100- 25  Cardiac: Echocardiogram 09/18/2013 LV cavity is dilated, EF 50-55%, moderate dilatation of RV cavity with mild reduction in systolic function.  Assessment:  Evaluation for interstitial lung disease. I have reviewed his CT scan which shows a basilar pattern of pulmonary fibrosis.  This appears to be in probable UIP pattern and has remained stable on follow-up imaging.  PFTs reviewed with restriction and diffusion  defect  Differential diagnosis at this point is connective tissue disease, chronic hypersensitivity pneumonitis versus IPF He has gotten rid of his down comforter and pillows since September 2022 CTD serologies are negative borderline elevation in SCL 100 which is likely nonspecific as he does not have any symptoms.  However he is apparently following with Dr. Amil Amen, rheumatology for inflammatory arthritis and is on chronic prednisone at 6 mg.  Will get office notes from rheumatology for our records.  Schedule PFTs and follow-up in 6 months Will monitor closely for progression of disease now that he does not have any ongoing exposures.  If there is progression in spite of this then we will start him on antifibrotics  Lower extremity edema Increase Lasix to 40 mg twice daily for 7 days.  Check metabolic panel, proBNP He follows with Dr. Acie Fredrickson, cardiology and will call for an appointment.  He may need a repeat echocardiogram  Plan/Recommendations: Metabolic panel, proBNP, CBC Increase Lasix to twice daily Follow-up with cardiology  Marshell Garfinkel MD [facility] 08/02/2021, 10:09 AM  CC: Christain Sacramento, MD

## 2021-08-02 NOTE — Telephone Encounter (Signed)
Called wife Jeffrey Stewart ok per DPR.  She reports pt has had BLE edema for over 3 months.  Reports left foot swelling greater than right.  Pt can't wear a regular shoe on left foot.  Pt denies pain to BLE noted with redness to top of feet.  Wife asked to use index finger and lightly press leg noted with an indention per wife. Pt arms and hands are also swollen.  Pt denies swelling to groin.  Pt does not weigh daily.  I advised BP and daily wt wife will call in readings on Friday.  Wife reports normal wt is 204 lbs (last wt over month ago) and today at pulmonology office wt 224 lbs.  Taking furosemide 40 mg PO QD for about the last 3 months.  Denies productive cough and SOB is normal for pt has a hx ILD.  Pt wears compression socks and limits salt intake.  Wife would like MD to know pt is not taking lisinopril  BP today at pulmonology office 116/67.  Will route to MD to address.

## 2021-08-02 NOTE — Telephone Encounter (Signed)
Pt c/o swelling: STAT is pt has developed SOB within 24 hours  How much weight have you gained and in what time span?  Patient's wife is unsure of exactly how much weight the patient has gained, but she states his normal weight is about 205 lbs and over the past few months he has gained a significant amount of weight. Today at pulmonologist he weighed in at 224 lbs and has been advised to follow up with cardiology. I scheduled him for 01/31 with Dr. Acie Fredrickson.  If swelling, where is the swelling located?  Hands, feet, legs   Are you currently taking a fluid pill?  Yes   Are you currently SOB?  No  Do you have a log of your daily weights (if so, list)?  1/17: 224 lbs  Have you gained 3 pounds in a day or 5 pounds in a week?  Unsure  Have you traveled recently?  No

## 2021-08-03 LAB — PRO B NATRIURETIC PEPTIDE: NT-Pro BNP: 108 pg/mL (ref 0–486)

## 2021-08-03 MED ORDER — TORSEMIDE 20 MG PO TABS: ORAL_TABLET | ORAL | 3 refills | Status: DC

## 2021-08-03 NOTE — Telephone Encounter (Signed)
Followed up with wife. Reports pt is compliant w/ salt limitations and has not taken in more than normal.  No change in diet, not eating take out/proceed foods. No licorice Prednisone is same dose as pt has been taking for a long time now.  Advised to stop Lasix and start Torsemide. Instructed to take 20 mg BID for 2 days, then reduce to 20 mg once daily. Aware to keep follow up appt next week and will perform blood work then as well. Advised to call office if concerns worsen/don't start to improve before next week. Wife verbalized understanding and agreeable to plan.

## 2021-08-05 ENCOUNTER — Telehealth: Payer: Self-pay | Admitting: Cardiovascular Disease

## 2021-08-05 NOTE — Telephone Encounter (Signed)
Called spoke with wife Parke Simmers ok per DPR.  Informed her that Dr. Acie Fredrickson is not in the office but message will be sent to MD to address.  Nothing urgent occurring with pt.   Wife expresses Dr. Acie Fredrickson informed her that SBP needs to be > 100.  Will route to MD to address.

## 2021-08-05 NOTE — Telephone Encounter (Signed)
Wife is calling back for his bp and weight info  17th: 223.2lbs 118/60 18th: 220.2lbs 112/64 19th: 219.4lbs 102/54 20th: 214.2lbs 102/47

## 2021-08-08 NOTE — Telephone Encounter (Signed)
Left detailed message for Pt.  Advised per Dr. Acie Fredrickson: Weight has come down on the higher dose torsemide  His BP looks stable   If he feels like he is getting too dehydrated, he should contact us   PN    Advised to call back if any further concerns.

## 2021-08-09 ENCOUNTER — Ambulatory Visit: Payer: Medicare HMO | Admitting: Cardiovascular Disease

## 2021-08-09 ENCOUNTER — Encounter: Payer: Self-pay | Admitting: Cardiovascular Disease

## 2021-08-09 ENCOUNTER — Other Ambulatory Visit: Payer: Self-pay

## 2021-08-09 VITALS — BP 112/60 | Ht 72.0 in | Wt 220.0 lb

## 2021-08-09 DIAGNOSIS — E782 Mixed hyperlipidemia: Secondary | ICD-10-CM | POA: Diagnosis not present

## 2021-08-09 DIAGNOSIS — I5022 Chronic systolic (congestive) heart failure: Secondary | ICD-10-CM

## 2021-08-09 DIAGNOSIS — I2510 Atherosclerotic heart disease of native coronary artery without angina pectoris: Secondary | ICD-10-CM

## 2021-08-09 LAB — LIPID PANEL
Chol/HDL Ratio: 3.1 ratio (ref 0.0–5.0)
Cholesterol, Total: 121 mg/dL (ref 100–199)
HDL: 39 mg/dL — ABNORMAL LOW (ref >39)
LDL Chol Calc (NIH): 56 mg/dL (ref 0–99)
Triglycerides: 149 mg/dL (ref 0–149)
VLDL Cholesterol Cal: 26 mg/dL (ref 5–40)

## 2021-08-09 LAB — BASIC METABOLIC PANEL
BUN/Creatinine Ratio: 24 (ref 10–24)
BUN: 31 mg/dL — ABNORMAL HIGH (ref 8–27)
CO2: 30 mmol/L — ABNORMAL HIGH (ref 20–29)
Calcium: 9.4 mg/dL (ref 8.6–10.2)
Chloride: 98 mmol/L (ref 96–106)
Creatinine, Ser: 1.3 mg/dL — ABNORMAL HIGH (ref 0.76–1.27)
Glucose: 122 mg/dL — ABNORMAL HIGH (ref 70–99)
Potassium: 4.1 mmol/L (ref 3.5–5.2)
Sodium: 140 mmol/L (ref 134–144)
eGFR: 54 mL/min/{1.73_m2} — ABNORMAL LOW (ref >59)

## 2021-08-09 LAB — ALT: ALT: 11 IU/L (ref 0–44)

## 2021-08-09 MED ORDER — LISINOPRIL 2.5 MG PO TABS: 2.5000 mg | ORAL_TABLET | Freq: Every day | ORAL | 0 refills | Status: DC

## 2021-08-09 NOTE — Patient Instructions (Signed)
Medication Instructions:  Instcu  *If you need a refill on your cardiac medications before your next appointment, please call your pharmacy*   Lab Work: TODAY:  LIPID, BMP, & ALT  If you have labs (blood work) drawn today and your tests are completely normal, you will receive your results only by: [place] (if you have MyChart) OR A paper copy in the mail If you have any lab test that is abnormal or we need to change your treatment, we will call you to review the results.   Testing/Procedures: Your physician has requested that you have an echocardiogram. Echocardiography is a painless test that uses sound waves to create images of your heart. It provides your doctor with information about the size and shape of your heart and how well your hearts chambers and valves are working. This procedure takes approximately one hour. There are no restrictions for this procedure.    Follow-Up: At [facility], you and your health needs are our priority.  As part of our continuing mission to provide you with exceptional heart care, we have created designated Provider Care Teams.  These Care Teams include your primary Cardiologist (physician) and Advanced Practice Providers (APPs -  Physician Assistants and Nurse Practitioners) who all work together to provide you with the care you need, when you need it.  We recommend signing up for the patient portal called "MyChart".  Sign up information is provided on this After Visit Summary.  MyChart is used to connect with patients for Virtual Visits (Telemedicine).  Patients are able to view lab/test results, encounter notes, upcoming appointments, etc.  Non-urgent messages can be sent to your provider as well.   To learn more about what you can do with MyChart, go to [email or web address].    Your next appointment:   6 month(s)  The format for your next appointment:   In Person  Provider:   Richardson Dopp, PA-C         Other  Instructions   For your  leg edema you  should do  the following 1. Leg elevation - I recommend the Lounge Dr. Leg rest.  See below for details  2. Salt restriction  -  Use potassium chloride instead of regular salt as a salt substitute. 3. Walk regularly 4. Compression hose - Medical Supply store  5. Weight loss    Available on [email or web address] Or  Go to [email or web address]

## 2021-08-09 NOTE — Progress Notes (Signed)
Cardiology Office Note   Date:  08/09/2021   ID:  Jeffrey Stewart, DOB 05-27-1935, MRN [id number]  PCP:  Jeffrey Bene, PA-C  Cardiologist: Jeffrey Moores, MD   Chief Complaint  Patient presents with   Coronary Artery Disease        Congestive Heart Failure   Hyperlipidemia        1. Congestive heart failure-chronic systolic congestive heart failure Left ventricle: The cavity size was moderately to severely   dilated. Wall thickness was normal. Systolic function was   normal. The estimated ejection fraction was in the range   of 50% to 55%. Wall motion was normal; there were no   regional wall motion abnormalities. There was an increased   relative contribution of atrial contraction to ventricular   filling. - Aortic valve: Mild regurgitation. - Left atrium: The atrium was mildly dilated. - Right ventricle: The cavity size was moderately dilated.   Systolic function was mildly reduced. - Right atrium: The atrium was mildly dilated 2. RBBB 3. Mild coronary artery disease- The left anterior descending artery is moderately calcified.  There is a  mid 40%-50% stenosis right at the takeoff of the first diagonal artery. D1 - moderate stenosis.   The left circumflex artery is large and is dominant.   80-90% PDA stenosis   The ramus intermediate vessel has minor luminal irregularities.  The right coronary artery is small, it is nondominant.   LV gram -  EF  35-40%.  There is hypokinesis of the anterior wall,  apex, and inferoapical wall.  The aortic root appears to be moderately  dilated.  4. Hyperlipidemia   87 yo gentleman with a hx of mild CHF.     He walks occasionally.  He has been exercising on a regular basis. He works out in his garden and walks on his treadmill in the evenings.   He's not had any significant episodes of chest pain or shortness breath.   Feb. 6, 2014  Jeffrey Stewart is doing well since I last saw him. He's not had any episodes of chest pain or  shortness breath. He's exercising on a regular basis.  March 10, 2013:  Jeffrey Stewart is doing well. He's not had any episodes of chest pain or shortness breath.  Playing golf, fishing, gardening, boating this past summer.   Tries to avoid salt.  No dypsnea.    Feb. 24, 2015:  Pt is doing OK.  No CP.  He has some abdominal issues - has 2 hernias.  Will be seen the surgeons today.  Sept. 3, 2015:  Jeffrey Stewart is doing well.  Had his hernias repaired No CP, no dyspnea.  Staying active.  Walks regularly.    September 15, 2014:   Jeffrey Stewart is a 87 y.o. male who presents for follow up of his CHF Occasional DOE, no CP.    Aug. 30, 2016:  Doing well.  Has some allergies . No CP or dyspnea.  Works out in the garden   Feb. 28, 2017:  Doing well.  Working in his garden   Oct. 17, 2017: Has had a rough summer. Injured his shoulders working on his floating dock this summer.  Now he is having aches all over , thinks its the atorvastatin  Lots of leg and feet swelling   Jan. 11, 2019:   history of chronic systolic congestive heart failure.  His last echocardiogram reveals normal left ventricular systolic function. He still has some occasional shortness of breath  with exertion. He does not exercise quite as much as he used to.  Is raising his great grandchildren .  January 28, 2018: Doing ok Raising 2 of his great grandchildren - 2 teenage girls. No CP,  Some Doe, Raising his garden     Jan.. 13, 2020 No CP ,  doingwell Breathing is ok Stays busy - raises his great grandchildren    46  Enjoys her garden .    Jan. 15, 2021 Doing well.   bp is  A bit low today  Eating and drinking well  No CP or dyspnea   Jan. 3, 2022 Jeffrey Stewart is seen today for follow up of his CHF Has been walking 1-1.5 miles a day .  No cp, no significant dyspnea  His greatgrandchildren are not living with him.   They were placed in a foster home and have run away .   He does not know how to contact them   August 09, 2021:   Jeffrey Stewart is seen today for follow-up visit.  His health has generally declined since I last saw him a year ago. Has interstitial lung disease , pulmonary fibrosis  No cp  Unable to walk   Past Medical History:  Diagnosis Date   Allergy    Arthritis    MINOR ARTHRITIS FEET AND TOES   Asthma    Cataract    CHF (congestive heart failure) ([place]) 11/2009   EF45-50%   Coronary artery disease    Hyperlipidemia    Myocardial infarction ([place])    Pneumonia    HX OF PNEUMONIA 4 OR 5 YRS AGO   Rosacea    OF FACE   Shortness of breath    SOMETIMES SOB OR WHEEZING WITH EXERTION    Past Surgical History:  Procedure Laterality Date   APPENDECTOMY     AGE 7   CARDIAC CATHETERIZATION  11/2009   mild / mod cad,MODERATE TIGHT STENOSIS IN THE DISTAL LEFT CIRCUMFLEX ARTERY   ELECTROCARDIOGRAM  11/2009   RBBB   EYE SURGERY     BILATERAL CATARACT EXTRACT EXTRACTION WITH IMPLANTS   INGUINAL HERNIA REPAIR Bilateral 09/29/2013   Procedure: LAPAROSCOPIC BILATERAL  INGUINAL HERNIA;  Surgeon: [name], MD;  Location: [facility];  Service: General;  Laterality: Bilateral;   INSERTION OF MESH Bilateral 09/29/2013   Procedure: INSERTION OF MESH;  Surgeon: [name], MD;  Location: [facility];  Service: General;  Laterality: Bilateral;   last echo 02/2009     last nuc 2009  12/16/2007   EF 33%. ABNORMAL. HE HAS APICAL DEFECT CONSISTENT WITH A SCAR.LV FUNCTION MODERATELY  DEPRESSED   US ECHOCARDIOGRAPHY  02/23/2009   EF 45-50%   US ECHOCARDIOGRAPHY  12/27/2007   EF 45-50%     Current Outpatient Medications  Medication Sig Dispense Refill   acetaminophen (TYLENOL) 500 MG tablet Take 1,000 mg by mouth every 6 (six) hours as needed for moderate pain.     albuterol (PROVENTIL HFA;VENTOLIN HFA) 108 (90 BASE) MCG/ACT inhaler Inhale 1 puff into the lungs every 6 (six) hours as needed for wheezing.      aspirin EC 81 MG tablet Take 81 mg by mouth every evening.     atorvastatin (LIPITOR) 40 MG tablet  TAKE 1 TABLET AT BEDTIME 90 tablet 3   brimonidine (ALPHAGAN) 0.2 % ophthalmic solution 1 drop 2 (two) times daily. 1 drop 2 (two) times daily.     carvedilol (COREG) 12.5 MG tablet TAKE 1 TABLET TWICE  DAILY WITH MEALS 180 tablet 1   cetirizine (ZYRTEC) 10 MG tablet Take 10 mg by mouth at bedtime.     cholecalciferol (VITAMIN D) 1000 UNITS tablet Take 1,000 Units by mouth daily.     fish oil-omega-3 fatty acids 1000 MG capsule Take 1 g by mouth 2 (two) times daily.     fluticasone (FLONASE) 50 MCG/ACT nasal spray Place 1-2 sprays into both nostrils daily.     Fluticasone-Salmeterol (ADVAIR) 250-50 MCG/DOSE AEPB Inhale 1 puff into the lungs 2 (two) times daily.     glucosamine-chondroitin 500-400 MG tablet Take 1 tablet by mouth daily.     ipratropium-albuterol (DUONEB) 0.5-2.5 (3) MG/3ML SOLN SMARTSIG:3 Milliliter(s) Via Nebulizer Every 6 Hours PRN     latanoprost (XALATAN) 0.005 % ophthalmic solution 1 drop at bedtime.     lisinopril (ZESTRIL) 2.5 MG tablet Take 1 tablet (2.5 mg total) by mouth daily. 90 tablet 0   metroNIDAZOLE (METROCREAM) 0.75 % cream Apply 1 application topically 2 (two) times daily.      minocycline (DYNACIN) 50 MG tablet Take 50 mg by mouth 2 (two) times daily.     NEOMYCIN-POLYMYXIN-HYDROCORTISONE (CORTISPORIN) 1 % SOLN OTIC solution Place 1 drop into both ears as needed.     nitroGLYCERIN (NITROSTAT) 0.4 MG SL tablet Place 1 tablet (0.4 mg total) under the tongue every 5 (five) minutes as needed for chest pain. 25 tablet 5   Polyethyl Glycol-Propyl Glycol 0.4-0.3 % SOLN Place 1 drop into both eyes 2 (two) times daily as needed (dry eyes).     potassium chloride SA (KLOR-CON) 20 MEQ tablet TAKE 1 TABLET EVERY DAY 90 tablet 1   predniSONE (DELTASONE) 5 MG tablet TAKE 5 MG TABLET BY MOUTH IN THE MORNING AND 1 MG TABLET BY MOUTH AT BEDTIME     sodium chloride (OCEAN) 0.65 % nasal spray Place 1 spray into the nose as needed for congestion.     torsemide (DEMADEX) 20 MG tablet  Take 1 tablet (20 mg total) twice daily for 2 days, then reduce and take 1 tablet daily 40 tablet 3   No current facility-administered medications for this visit.    Allergies:   Niaspan [niacin] and Meloxicam    Social History:  The patient  reports that he has never smoked. He has never used smokeless tobacco. He reports that he does not currently use alcohol. He reports that he does not use drugs.   Family History:  The patient's family history includes Diabetes in his sister; Lung cancer in his sister.    ROS:   Noted in current hx.  Otherwise negative.    Physical Exam: Blood pressure 112/60, height 6' (1.829 m), weight 220 lb (99.8 kg), SpO2 98 %.  GEN:  Well nourished, well developed in no acute distress HEENT: Normal NECK: No JVD; No carotid bruits LYMPHATICS: No lymphadenopathy CARDIAC: RRR , no murmurs, rubs, gallops RESPIRATORY:  Clear to auscultation without rales, wheezing or rhonchi  ABDOMEN: Soft, non-tender, non-distended MUSCULOSKELETAL:  No edema; No deformity  SKIN: Warm and dry NEUROLOGIC:  Alert and oriented x 3   EKG:   August 09, 2021: Normal sinus rhythm at 68.  Right bundle branch block.  Occasional premature ventricular contraction .   Recent Labs: 08/02/2021: ALT 11; BUN 28; Creatinine, Ser 1.20; Hemoglobin 13.1; NT-Pro BNP 108; Platelets 151.0; Potassium 4.5; Sodium 139    Lipid Panel    Component Value Date/Time   CHOL 102 07/19/2020 0918   TRIG 95 07/19/2020  1224   HDL 36 (L) 07/19/2020 0918   CHOLHDL 2.8 07/19/2020 0918   CHOLHDL 3.7 05/02/2016 0856   VLDL 15 05/02/2016 0856   LDLCALC 48 07/19/2020 0918      Wt Readings from Last 3 Encounters:  08/09/21 220 lb (99.8 kg)  08/02/21 223 lb (101.2 kg)  05/06/21 219 lb 6.4 oz (99.5 kg)      Other studies Reviewed: Additional studies/ records that were reviewed today include: . Review of the above records demonstrates:    ASSESSMENT AND PLAN:  1. Congestive heart  failure-chronic systolic congestive heart failure  Will given info on the loung doctor leg rest.  On torsemide Ok to double torsemide on occasion Needs to cut salt  / bacon  Repeat echo - last echo was 2015   2. RBBB-     3. Mild coronary artery disease-    No angina    4. Hyperlipidemia  -     5.   Interstitial Lung disease.  Has significant leg / feet swelling . On demadex He doubles the demadex on occasion wihen his feet are swollen    Current medicines are reviewed at length with the patient today.  The patient does not have concerns regarding medicines.  The following changes have been made:  no change  Disposition:    With me in 1 year   Signed, Jeffrey Moores, MD  08/09/2021 11:56 AM    [facility] [place], [place], [place]  [address] Phone: [phone]; Fax: [phone]

## 2021-08-11 ENCOUNTER — Encounter: Payer: Self-pay | Admitting: Cardiovascular Disease

## 2021-08-11 ENCOUNTER — Telehealth: Payer: Self-pay

## 2021-08-11 MED ORDER — LISINOPRIL 2.5 MG PO TABS: 2.5000 mg | ORAL_TABLET | Freq: Every day | ORAL | 3 refills | Status: DC

## 2021-08-11 NOTE — Telephone Encounter (Signed)
-----   Message from Thayer Headings, MD sent at 08/11/2021  6:57 AM EST ----- His creatinine is mildly elevated because of his increased torsemide dose - but he needs the additional torsemide for his leg edema Cont meds at current medications He should get a lounge doctor leg rest to help with his leg edema  Available on [email or web address]

## 2021-08-11 NOTE — Telephone Encounter (Signed)
Patient returned call regarding labs, discussed results with patient and Dr. Elmarie Shiley recommendation to purchase lounge doctor leg rest for leg edema. Patient states he has already purchased the leg rest and waiting for its arrival. Patient states he has been out of his Lisinopril 2.5mg  PO QD for 6 months now. Refill for Lisinopril sent to pharmacy of choice.   Will send patient a copy of lab results by mail per patient's request.

## 2021-08-16 ENCOUNTER — Ambulatory Visit: Payer: Medicare HMO | Admitting: Cardiovascular Disease

## 2021-08-25 ENCOUNTER — Ambulatory Visit (HOSPITAL_COMMUNITY): Payer: Medicare HMO | Attending: Cardiology

## 2021-08-25 ENCOUNTER — Other Ambulatory Visit: Payer: Self-pay

## 2021-08-25 DIAGNOSIS — I5022 Chronic systolic (congestive) heart failure: Secondary | ICD-10-CM | POA: Diagnosis not present

## 2021-08-25 DIAGNOSIS — E782 Mixed hyperlipidemia: Secondary | ICD-10-CM | POA: Diagnosis not present

## 2021-08-25 DIAGNOSIS — I272 Pulmonary hypertension, unspecified: Secondary | ICD-10-CM

## 2021-08-25 LAB — ECHOCARDIOGRAM COMPLETE
Area-P 1/2: 3.42 cm2
P 1/2 time: 478 msec
S' Lateral: 4 cm

## 2021-09-19 ENCOUNTER — Other Ambulatory Visit: Payer: Self-pay | Admitting: Cardiovascular Disease

## 2021-10-17 ENCOUNTER — Encounter: Payer: Self-pay | Admitting: Pulmonary Disease

## 2021-10-17 ENCOUNTER — Ambulatory Visit: Payer: Medicare HMO | Admitting: Pulmonary Disease

## 2021-10-17 VITALS — BP 122/62 | HR 71 | Temp 97.9°F | Ht 72.0 in | Wt 218.0 lb

## 2021-10-17 DIAGNOSIS — J849 Interstitial pulmonary disease, unspecified: Secondary | ICD-10-CM | POA: Diagnosis not present

## 2021-10-17 DIAGNOSIS — I5022 Chronic systolic (congestive) heart failure: Secondary | ICD-10-CM

## 2021-10-17 NOTE — Patient Instructions (Signed)
I am glad your breathing is stable ?We will get a high-res CT and PFTs in 8 months and return to clinic after these tests ?

## 2021-10-17 NOTE — Progress Notes (Addendum)
Jeffrey Stewart    [id number]    04/29/1935  Primary Care Physician:Williams, Gracelyn Nurse, PA-C  Referring Physician: Heywood Bene, PA-C [id number] [place],  [place] [address]  Chief complaint: Follow-up for interstitial lung disease, abnormal CT scan  HPI: 87 year old with history of CHF, coronary artery disease.   Complains of worsening dyspnea on exertion for the past year.  Has occasional cough with mucus.  Denies any wheezing, fevers, chills He was treated for a community-acquired pneumonia in July 2022.  A follow-up CT showed interstitial changes with basilar fibrosis and mild groundglass opacity in the right upper lobe.  He has been referred here for further evaluation.  Developed COVID-19 and November 2022 treated with molnupiravir.  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  Later worked in the [employer] of a [employer] company ILD exposure questionnaire 03/15/2021.  No mold, hot tub, Jacuzzi.  Does not recall exposure to asbestos.  He has feather pillows and comforters that he is used for many years Smoking history: Never smoker Travel history: No significant travel history Relevant family history: No family history of lung disease  Interval history: He is following with Dr. Amil Amen, rheumatology for arthritis of unclear etiology.  He is on chronic prednisone at 6 mg from rheumatology  At last visit he had lower extremity edema and we increased Lasix dose with improvement in symptoms of dyspnea and edema. Follows with Dr. Acie Fredrickson for heart failure.  Outpatient Encounter Medications as of 10/17/2021  Medication Sig   acetaminophen (TYLENOL) 500 MG tablet Take 1,000 mg by mouth every 6 (six) hours as needed for moderate pain.   albuterol (PROVENTIL HFA;VENTOLIN HFA) 108 (90 BASE) MCG/ACT inhaler Inhale 1 puff into the lungs every 6 (six) hours as needed for  wheezing.    aspirin EC 81 MG tablet Take 81 mg by mouth every evening.   atorvastatin (LIPITOR) 40 MG tablet TAKE 1 TABLET AT BEDTIME   brimonidine (ALPHAGAN) 0.2 % ophthalmic solution 1 drop 2 (two) times daily. 1 drop 2 (two) times daily.   carvedilol (COREG) 12.5 MG tablet TAKE 1 TABLET TWICE DAILY WITH MEALS   cetirizine (ZYRTEC) 10 MG tablet Take 10 mg by mouth at bedtime.   cholecalciferol (VITAMIN D) 1000 UNITS tablet Take 1,000 Units by mouth daily.   fish oil-omega-3 fatty acids 1000 MG capsule Take 1 g by mouth 2 (two) times daily.   fluticasone (FLONASE) 50 MCG/ACT nasal spray Place 1-2 sprays into both nostrils daily.   Fluticasone-Salmeterol (ADVAIR) 250-50 MCG/DOSE AEPB Inhale 1 puff into the lungs 2 (two) times daily.   glucosamine-chondroitin 500-400 MG tablet Take 1 tablet by mouth daily.   ipratropium-albuterol (DUONEB) 0.5-2.5 (3) MG/3ML SOLN SMARTSIG:3 Milliliter(s) Via Nebulizer Every 6 Hours PRN   latanoprost (XALATAN) 0.005 % ophthalmic solution 1 drop at bedtime.   lisinopril (ZESTRIL) 2.5 MG tablet Take 1 tablet (2.5 mg total) by mouth daily.   metroNIDAZOLE (METROCREAM) 0.75 % cream Apply 1 application topically 2 (two) times daily.    minocycline (DYNACIN) 50 MG tablet Take 50 mg by mouth 2 (two) times daily.   NEOMYCIN-POLYMYXIN-HYDROCORTISONE (CORTISPORIN) 1 % SOLN OTIC solution Place 1 drop into both ears as needed.   nitroGLYCERIN (NITROSTAT) 0.4 MG SL tablet Place 1 tablet (0.4 mg total) under the tongue every 5 (five) minutes as needed for  chest pain.   Polyethyl Glycol-Propyl Glycol 0.4-0.3 % SOLN Place 1 drop into both eyes 2 (two) times daily as needed (dry eyes).   potassium chloride SA (KLOR-CON) 20 MEQ tablet TAKE 1 TABLET EVERY DAY   predniSONE (DELTASONE) 5 MG tablet TAKE 5 MG TABLET BY MOUTH IN THE MORNING AND 1 MG TABLET BY MOUTH AT BEDTIME   sodium chloride (OCEAN) 0.65 % nasal spray Place 1 spray into the nose as needed for congestion.   torsemide  (DEMADEX) 20 MG tablet Take 1 tablet (20 mg total) twice daily for 2 days, then reduce and take 1 tablet daily   No facility-administered encounter medications on file as of 10/17/2021.    Physical Exam: Blood pressure 122/62, pulse 71, temperature 97.9 F (36.6 C), temperature source Oral, height 6' (1.829 m), weight 218 lb (98.9 kg), SpO2 98 %. Gen:      No acute distress HEENT:  EOMI, sclera anicteric Neck:     No masses; no thyromegaly Lungs:    Clear to auscultation bilaterally; normal respiratory effort CV:         Regular rate and rhythm; no murmurs Abd:      + bowel sounds; soft, non-tender; no palpable masses, no distension Ext:    No edema; adequate peripheral perfusion Skin:      Warm and dry; no rash Neuro: alert and oriented x 3 Psych: normal mood and affect  Data Reviewed: Imaging: CT abdomen pelvis 08/28/2013-mild reticular changes at the base CT chest 01/31/2021-basal fibrotic changes with mild traction bronchiectasis, scattered groundglass pulmonary infiltrates in the posterior right upper lobe and the left lower lobe. 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  Probable bronchopneumonia in the right upper lobe with areas of consolidation and pre-existing region interstitial lung disease High-resolution CT 07/28/2021-improvement in inflammatory changes of pneumonia.  Stable probable UIP pattern pulmonary fibrosis I have reviewed the images personally.  PFTs: 05/06/2021 FVC 2.39 (59%), FEV1 1.70 60%, F/F 71, TLC 4.67 64%, DLCO 12.91 52% Severe restriction, diffusion defect  Labs: CTD serologies 03/15/2021-  SCL 100- 25  Cardiac: Echocardiogram 09/18/2013 LV cavity is dilated, EF 50-55%, moderate dilatation of RV cavity with mild reduction in systolic function.  Assessment:  Evaluation for interstitial lung disease. I have reviewed his CT scan which shows a basilar pattern of pulmonary fibrosis.  This appears to be in probable UIP pattern  and has remained stable on follow-up imaging.  PFTs reviewed with restriction and diffusion defect  Differential diagnosis at this point is connective tissue disease, chronic hypersensitivity pneumonitis versus IPF He has gotten rid of his down comforter and pillows since September 2022 CTD serologies are negative borderline elevation in SCL 100 which is likely nonspecific as he does not have any symptoms.  However he is apparently following with Dr. Amil Amen, rheumatology for inflammatory arthritis and is on chronic prednisone at 6 mg.  Will get office notes from rheumatology for our records.   Will monitor closely for progression of disease now that he does not have any ongoing exposures.  If there is progression in spite of this then we will start him on antifibrotics  Scheduled follow-up CT and PFTs later this year  Plan/Recommendations: High-res CT, PFTs  Marshell Garfinkel MD [facility] 10/17/2021, 9:23 AM  CC: Heywood Bene, *  Addendum: Received note from Dr. Amil Amen dated 01/31/2022 Patient is being followed for polymyalgia rheumatica and has features of seronegative rheumatoid arthritis.  He had failed a trial  of methotrexate previously Joint symptoms are stable at present  Unable to taper prednisone below 6 mg.

## 2021-10-20 ENCOUNTER — Other Ambulatory Visit: Payer: Self-pay | Admitting: Physician Assistant

## 2021-10-20 DIAGNOSIS — D696 Thrombocytopenia, unspecified: Secondary | ICD-10-CM

## 2021-10-21 ENCOUNTER — Inpatient Hospital Stay: Payer: Medicare HMO | Attending: Physician Assistant

## 2021-10-21 ENCOUNTER — Other Ambulatory Visit: Payer: Self-pay

## 2021-10-21 ENCOUNTER — Inpatient Hospital Stay: Payer: Medicare HMO | Admitting: Physician Assistant

## 2021-10-21 VITALS — BP 97/49 | HR 73 | Temp 98.1°F | Resp 18 | Wt 219.7 lb

## 2021-10-21 DIAGNOSIS — D696 Thrombocytopenia, unspecified: Secondary | ICD-10-CM | POA: Insufficient documentation

## 2021-10-21 DIAGNOSIS — D7589 Other specified diseases of blood and blood-forming organs: Secondary | ICD-10-CM | POA: Diagnosis not present

## 2021-10-21 DIAGNOSIS — D539 Nutritional anemia, unspecified: Secondary | ICD-10-CM | POA: Insufficient documentation

## 2021-10-21 DIAGNOSIS — Z801 Family history of malignant neoplasm of trachea, bronchus and lung: Secondary | ICD-10-CM | POA: Insufficient documentation

## 2021-10-21 LAB — CBC WITH DIFFERENTIAL (CANCER CENTER ONLY)
Abs Immature Granulocytes: 0.12 10*3/uL — ABNORMAL HIGH (ref 0.00–0.07)
Basophils Absolute: 0 10*3/uL (ref 0.0–0.1)
Basophils Relative: 0 %
Eosinophils Absolute: 0 10*3/uL (ref 0.0–0.5)
Eosinophils Relative: 0 %
HCT: 38.2 % — ABNORMAL LOW (ref 39.0–52.0)
Hemoglobin: 12.7 g/dL — ABNORMAL LOW (ref 13.0–17.0)
Immature Granulocytes: 2 %
Lymphocytes Relative: 17 %
Lymphs Abs: 1.4 10*3/uL (ref 0.7–4.0)
MCH: 34.6 pg — ABNORMAL HIGH (ref 26.0–34.0)
MCHC: 33.2 g/dL (ref 30.0–36.0)
MCV: 104.1 fL — ABNORMAL HIGH (ref 80.0–100.0)
Monocytes Absolute: 0.6 10*3/uL (ref 0.1–1.0)
Monocytes Relative: 8 %
Neutro Abs: 5.8 10*3/uL (ref 1.7–7.7)
Neutrophils Relative %: 73 %
Platelet Count: 133 10*3/uL — ABNORMAL LOW (ref 150–400)
RBC: 3.67 MIL/uL — ABNORMAL LOW (ref 4.22–5.81)
RDW: 15.9 % — ABNORMAL HIGH (ref 11.5–15.5)
WBC Count: 8 10*3/uL (ref 4.0–10.5)
nRBC: 0 % (ref 0.0–0.2)

## 2021-10-21 LAB — CMP (CANCER CENTER ONLY)
ALT: 11 U/L (ref 0–44)
AST: 13 U/L — ABNORMAL LOW (ref 15–41)
Albumin: 3.7 g/dL (ref 3.5–5.0)
Alkaline Phosphatase: 54 U/L (ref 38–126)
Anion gap: 3 — ABNORMAL LOW (ref 5–15)
BUN: 20 mg/dL (ref 8–23)
CO2: 30 mmol/L (ref 22–32)
Calcium: 9.1 mg/dL (ref 8.9–10.3)
Chloride: 101 mmol/L (ref 98–111)
Creatinine: 1.15 mg/dL (ref 0.61–1.24)
GFR, Estimated: >60 mL/min (ref >60)
Glucose, Bld: 99 mg/dL (ref 70–99)
Potassium: 4.4 mmol/L (ref 3.5–5.1)
Sodium: 134 mmol/L — ABNORMAL LOW (ref 135–145)
Total Bilirubin: 1 mg/dL (ref 0.3–1.2)
Total Protein: 6 g/dL — ABNORMAL LOW (ref 6.5–8.1)

## 2021-10-21 NOTE — Progress Notes (Signed)
?[name] ?Telephone:([phone]) [phone]   [phone] ? ?PROGRESS NOTE ? ?Patient Care Team: ?Merwyn Katos as PCP - General (Physician Assistant) ?Nahser, Wonda Cheng, MD as PCP - Cardiology (Cardiology) ?Fanny Skates, MD as Consulting Physician (General Surgery) ? ?Hematological/Oncological History ?1) Labs from PCP, Tia McPhatter: ?-12/29/2020: WBC 9.8, Hgb 14.4, MCV 103.7, Plt 168,  Band Abs 0.8 (H), Myelo Abs 0.4 (H) ?-01/06/2021: WBC 9.8, Hgb 14.2, MCV 103.4, Plt 207, Mono Manuel Ab 1.1 (H), Meta Absolute 0.2 (H) ? ?2) 01/21/2021: Establish care with Dede Query PA-C ? ?CHIEF COMPLAINTS/PURPOSE OF CONSULTATION:  ?-Elevated metamyelocytes ?-Thrombocytopenia ?-Macrocytosis ? ?HISTORY OF PRESENTING ILLNESS:  ?Jeffrey Stewart 87 y.o. male returns for a follow up. Patient is accompanied by his wife for this visit. He was last seen in clinic on 04/22/2021. In the interim, he denies any changes to his health.  ? ?Today, Ms. Ervine reports slightly more fatigued than baseline but he continues to complete his daily activities on his own. He has a good appetite without any weight loss. He denies nausea, vomiting or abdominal pain. His bowel habits are unchanged without any recurrent diarrhea or constipation. He has chronic back pain that is managed with muscle relaxants and periodic steroid injections. He has chronic shortness of breath with exertion and he uses a nebulizer as need with relief. He continues to have easy bruising but no signs of active bleeding. He reports improved bilateral lower extremity edema and he is compliant with wearing compression stockings. He denies any fevers, chills, night sweats, chest pain or cough. He has no other complaints. Rest of the 10 point ROS is below.  ? ?MEDICAL HISTORY:  ?Past Medical History:  ?Diagnosis Date  ? Allergy   ? Arthritis   ? MINOR ARTHRITIS FEET AND TOES  ? Asthma   ? Cataract   ? CHF (congestive heart failure) ([place]) 11/2009  ?  EF45-50%  ? Coronary artery disease   ? Hyperlipidemia   ? Myocardial infarction [facility])   ? Pneumonia   ? HX OF PNEUMONIA 4 OR 5 YRS AGO  ? Rosacea   ? OF FACE  ? Shortness of breath   ? SOMETIMES SOB OR WHEEZING WITH EXERTION  ? ? ?SURGICAL HISTORY: ?Past Surgical History:  ?Procedure Laterality Date  ? APPENDECTOMY    ? AGE 39  ? CARDIAC CATHETERIZATION  11/2009  ? mild / mod cad,MODERATE TIGHT STENOSIS IN THE DISTAL LEFT CIRCUMFLEX ARTERY  ? ELECTROCARDIOGRAM  11/2009  ? RBBB  ? EYE SURGERY    ? BILATERAL CATARACT EXTRACT EXTRACTION WITH IMPLANTS  ? INGUINAL HERNIA REPAIR Bilateral 09/29/2013  ? Procedure: LAPAROSCOPIC BILATERAL  INGUINAL HERNIA;  Surgeon: [name], MD;  Location: [facility];  Service: General;  Laterality: Bilateral;  ? INSERTION OF MESH Bilateral 09/29/2013  ? Procedure: INSERTION OF MESH;  Surgeon: [name], MD;  Location: [facility];  Service: General;  Laterality: Bilateral;  ? last echo 02/2009    ? last nuc 2009  12/16/2007  ? EF 33%. ABNORMAL. HE HAS APICAL DEFECT CONSISTENT WITH A SCAR.LV FUNCTION MODERATELY  DEPRESSED  ? US ECHOCARDIOGRAPHY  02/23/2009  ? EF 45-50%  ? US ECHOCARDIOGRAPHY  12/27/2007  ? EF 45-50%  ? ? ?SOCIAL HISTORY: ?Social History  ? ?Socioeconomic History  ? Marital status: Married  ?  Spouse name: Not on file  ? Number of children: Not on file  ? Years of education: Not on file  ? Highest education  level: Not on file  ?Occupational History  ? Not on file  ?Tobacco Use  ? Smoking status: Never  ? Smokeless tobacco: Never  ?Vaping Use  ? Vaping Use: Never used  ?Substance and Sexual Activity  ? Alcohol use: Not Currently  ?  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  ? Drug use: No  ? Sexual activity: Not on file  ?Other Topics Concern  ? Not on file  ?Social History Narrative  ? Not on file  ? ?Social Determinants of Health  ? ?Financial Resource Strain: Not on file  ?Food Insecurity: Not on file  ?Transportation Needs: Not on file  ?Physical Activity: Not on file  ?Stress:  Not on file  ?Social Connections: Not on file  ?Intimate Partner Violence: Not on file  ? ? ?FAMILY HISTORY: ?Family History  ?Problem Relation Age of Onset  ? Diabetes Sister   ? Lung cancer Sister   ?     smoker  ? ? ?ALLERGIES:  is allergic to niaspan [niacin] and meloxicam. ? ?MEDICATIONS:  ?Current Outpatient Medications  ?Medication Sig Dispense Refill  ? acetaminophen (TYLENOL) 500 MG tablet Take 1,000 mg by mouth every 6 (six) hours as needed for moderate pain.    ? albuterol (PROVENTIL HFA;VENTOLIN HFA) 108 (90 BASE) MCG/ACT inhaler Inhale 1 puff into the lungs every 6 (six) hours as needed for wheezing.     ? aspirin EC 81 MG tablet Take 81 mg by mouth every evening.    ? atorvastatin (LIPITOR) 40 MG tablet TAKE 1 TABLET AT BEDTIME 90 tablet 3  ? brimonidine (ALPHAGAN) 0.2 % ophthalmic solution 1 drop 2 (two) times daily. 1 drop 2 (two) times daily.    ? carvedilol (COREG) 12.5 MG tablet TAKE 1 TABLET TWICE DAILY WITH MEALS 180 tablet 3  ? cetirizine (ZYRTEC) 10 MG tablet Take 10 mg by mouth at bedtime.    ? cholecalciferol (VITAMIN D) 1000 UNITS tablet Take 1,000 Units by mouth daily.    ? fish oil-omega-3 fatty acids 1000 MG capsule Take 1 g by mouth 2 (two) times daily.    ? fluticasone (FLONASE) 50 MCG/ACT nasal spray Place 1-2 sprays into both nostrils daily.    ? Fluticasone-Salmeterol (ADVAIR) 250-50 MCG/DOSE AEPB Inhale 1 puff into the lungs 2 (two) times daily.    ? glucosamine-chondroitin 500-400 MG tablet Take 1 tablet by mouth daily.    ? ipratropium-albuterol (DUONEB) 0.5-2.5 (3) MG/3ML SOLN SMARTSIG:3 Milliliter(s) Via Nebulizer Every 6 Hours PRN    ? latanoprost (XALATAN) 0.005 % ophthalmic solution 1 drop at bedtime.    ? lisinopril (ZESTRIL) 2.5 MG tablet Take 1 tablet (2.5 mg total) by mouth daily. 90 tablet 3  ? metroNIDAZOLE (METROCREAM) 0.75 % cream Apply 1 application topically 2 (two) times daily.     ? minocycline (DYNACIN) 50 MG tablet Take 50 mg by mouth 2 (two) times daily.    ?  NEOMYCIN-POLYMYXIN-HYDROCORTISONE (CORTISPORIN) 1 % SOLN OTIC solution Place 1 drop into both ears as needed.    ? nitroGLYCERIN (NITROSTAT) 0.4 MG SL tablet Place 1 tablet (0.4 mg total) under the tongue every 5 (five) minutes as needed for chest pain. 25 tablet 5  ? Polyethyl Glycol-Propyl Glycol 0.4-0.3 % SOLN Place 1 drop into both eyes 2 (two) times daily as needed (dry eyes).    ? potassium chloride SA (KLOR-CON) 20 MEQ tablet TAKE 1 TABLET EVERY DAY 90 tablet 1  ? predniSONE (DELTASONE) 5 MG tablet TAKE 5 MG  TABLET BY MOUTH IN THE MORNING AND 1 MG TABLET BY MOUTH AT BEDTIME    ? sodium chloride (OCEAN) 0.65 % nasal spray Place 1 spray into the nose as needed for congestion.    ? torsemide (DEMADEX) 20 MG tablet Take 1 tablet (20 mg total) twice daily for 2 days, then reduce and take 1 tablet daily 40 tablet 3  ? ?No current facility-administered medications for this visit.  ? ? ?REVIEW OF SYSTEMS:   ?Constitutional: ( - ) fevers, ( - )  chills , ( - ) night sweats ?Eyes: ( - ) blurriness of vision, ( - ) double vision, ( - ) watery eyes ?Ears, nose, mouth, throat, and face: ( - ) mucositis, ( - ) sore throat ?Respiratory: ( -) cough, ( - ) dyspnea, ( -) wheezes  ?Cardiovascular: ( - ) palpitation, ( - ) chest discomfort, ( + ) lower extremity swelling ?Gastrointestinal:  ( - ) nausea, ( - ) heartburn, ( - ) change in bowel habits ?Skin: ( - ) abnormal skin rashes ?Lymphatics: ( - ) new lymphadenopathy, ( - ) easy bruising ?Neurological: ( - ) numbness, ( - ) tingling, ( - ) new weaknesses ?Behavioral/Psych: ( - ) mood change, ( - ) new changes  ?All other systems were reviewed with the patient and are negative. ? ?PHYSICAL EXAMINATION: ?ECOG PERFORMANCE STATUS: 1 - Symptomatic but completely ambulatory ? ?Vitals:  ? 10/21/21 1045  ?BP: (!) 97/49  ?Pulse: 73  ?Resp: 18  ?Temp: 98.1 ?F (36.7 ?C)  ?SpO2: 96%  ? ?Filed Weights  ? 10/21/21 1045  ?Weight: 219 lb 11.2 oz (99.7 kg)  ? ? ?GENERAL: well appearing male  in NAD  ?SKIN: skin color, texture, turgor are normal, no rashes or significant lesions. Bruising over both arms.  ?EYES: conjunctiva are pink and non-injected, sclera clear ?OROPHARYNX: no exudate, no ery

## 2021-11-04 ENCOUNTER — Ambulatory Visit (HOSPITAL_COMMUNITY)
Admission: RE | Admit: 2021-11-04 | Discharge: 2021-11-04 | Disposition: A | Discharge status: Home / Self Care | Payer: Medicare HMO | Source: Ambulatory Visit | Attending: Physician Assistant | Admitting: Physician Assistant

## 2021-11-04 DIAGNOSIS — D696 Thrombocytopenia, unspecified: Secondary | ICD-10-CM | POA: Insufficient documentation

## 2021-11-04 DIAGNOSIS — D7589 Other specified diseases of blood and blood-forming organs: Secondary | ICD-10-CM | POA: Insufficient documentation

## 2021-11-08 ENCOUNTER — Telehealth: Payer: Self-pay

## 2021-11-08 NOTE — Telephone Encounter (Signed)
Pt's wife, Parke Simmers, notified and will relay results to pt. ?

## 2021-11-08 NOTE — Telephone Encounter (Signed)
-----   Message from Lincoln Brigham, PA-C sent at 11/08/2021  9:15 AM EDT ----- ?Please notify patient that there is still some splenomegaly which can the lower platelet levels that is seen. Otherwise no evidence of liver disease.  ?

## 2021-11-21 ENCOUNTER — Other Ambulatory Visit: Payer: Self-pay | Admitting: Cardiovascular Disease

## 2021-12-15 ENCOUNTER — Other Ambulatory Visit: Payer: Self-pay | Admitting: Cardiovascular Disease

## 2022-01-02 IMAGING — CT CT CHEST HIGH RESOLUTION W/O CM
2 of 7 series · 13 of 36 positions shown, 16 images · non-contrast
Comparison: Chest CT 01/31/2021.

CLINICAL DATA: 86-year-old male with history of interstitial lung
disease. Follow-up study.

EXAM:
CT CHEST WITHOUT CONTRAST
TECHNIQUE: Multidetector CT imaging of the chest was performed following the
standard protocol without intravenous contrast. High resolution
imaging of the lungs, as well as inspiratory and expiratory imaging,
was performed.

[Series 4: chest 2.00 br36 s3 cor soft · coronal · 0.64mm/px · 3 of 212 slices shown]
[im 43/212  lung]
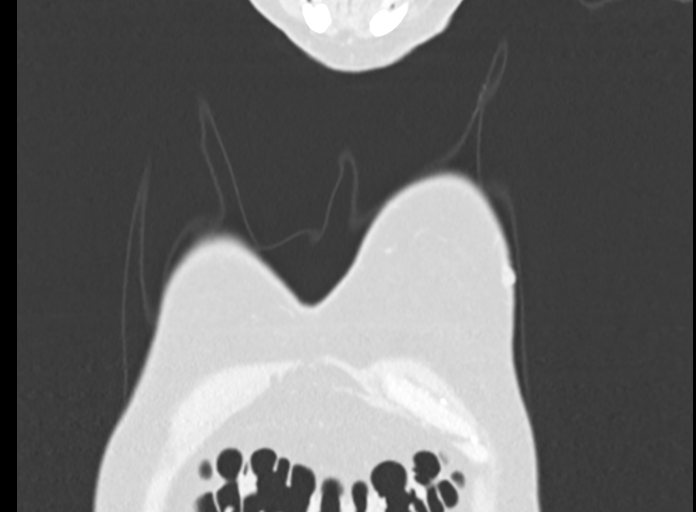
[im 85/212  lung]
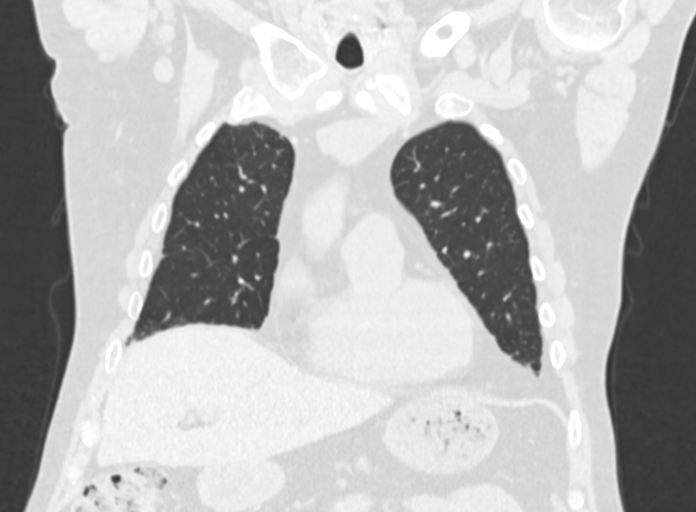
[im 127/212  lung]
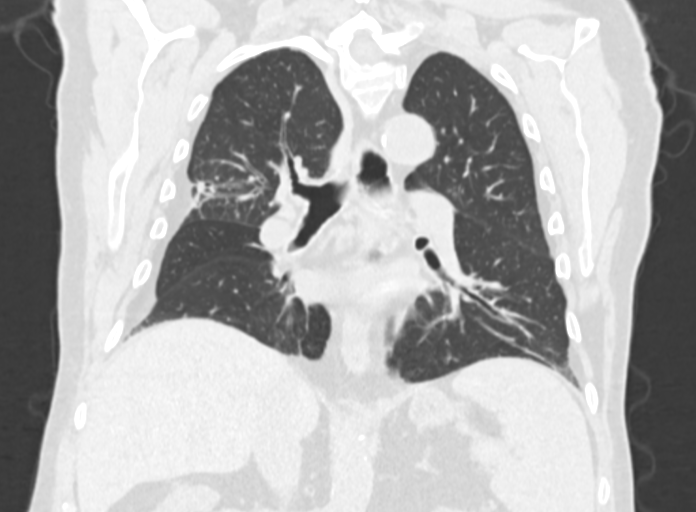

[Series 11: chest 1.00 br60 s3 high res thins 1x1 mm · axial · 0.83mm/px · z∈[+1589,+1864]mm · 10 of 326 slices shown, 13 images]
[im 26/326  mediastinal]
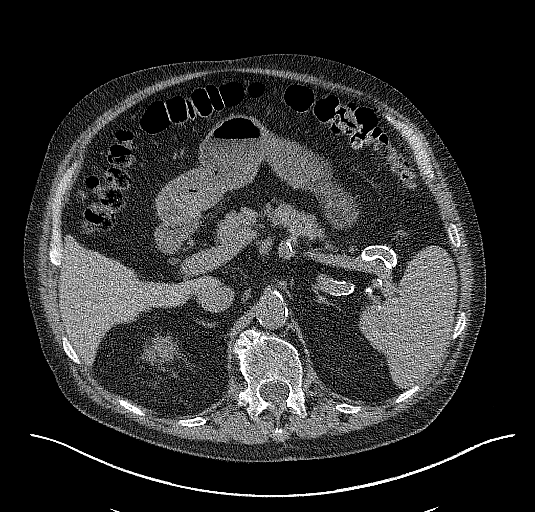
[im 26/326  lung]
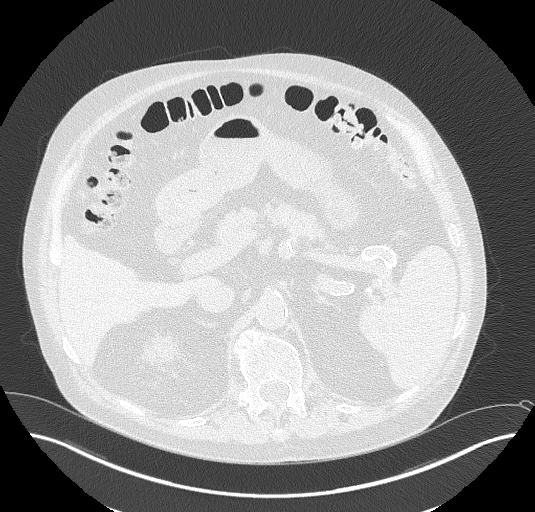
[im 51/326  lung]
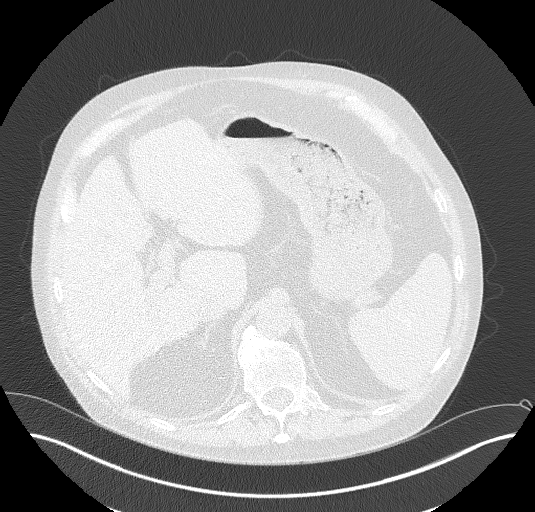
[im 101/326  lung]
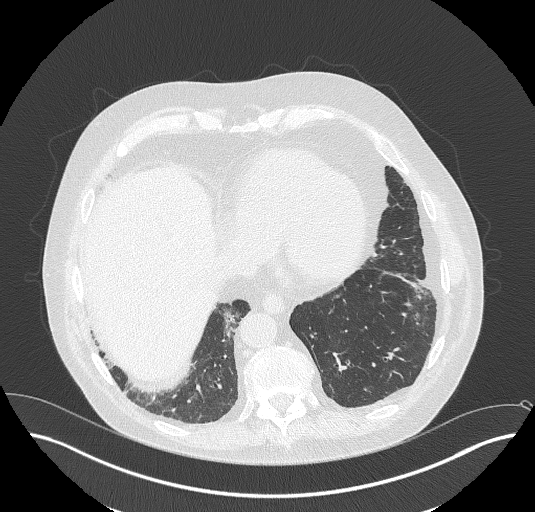
[im 126/326  lung]
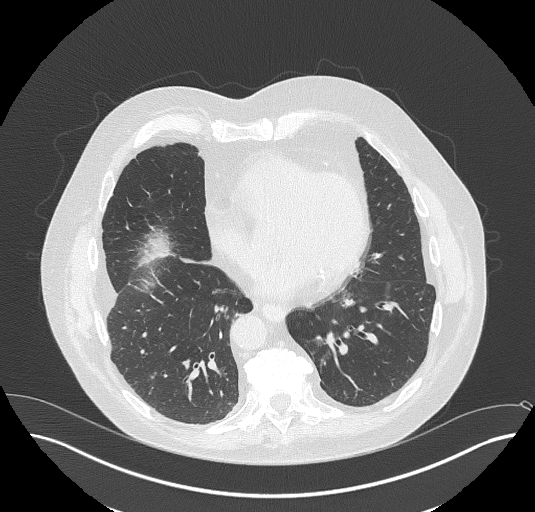
[im 151/326  mediastinal]
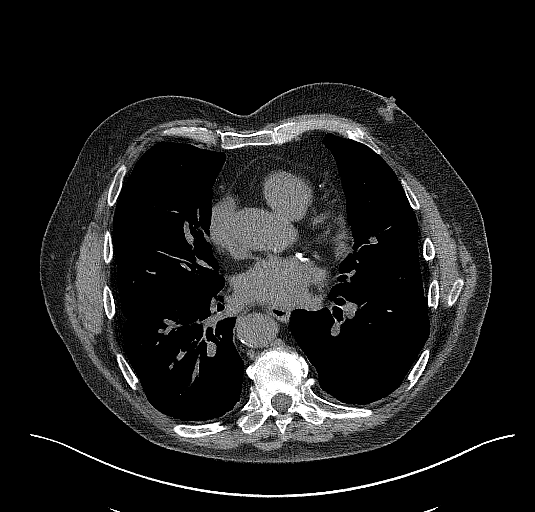
[im 151/326  lung]
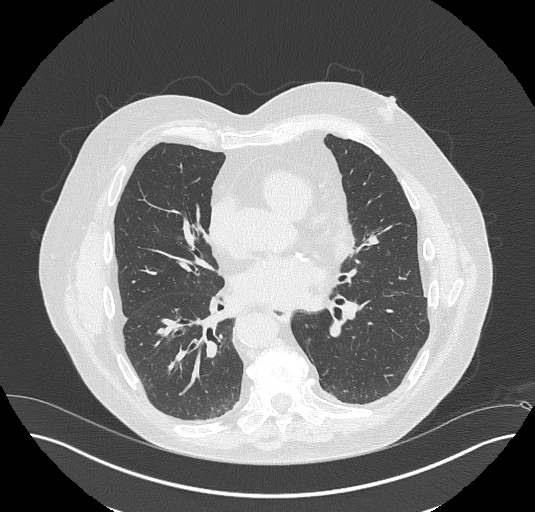
[im 176/326  lung]
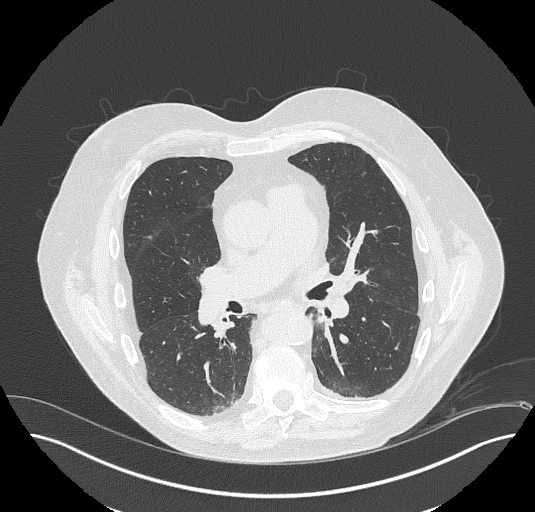
[im 201/326  lung]
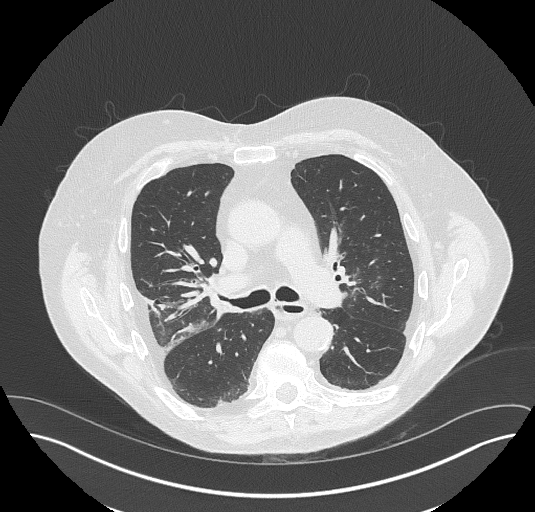
[im 251/326  lung]
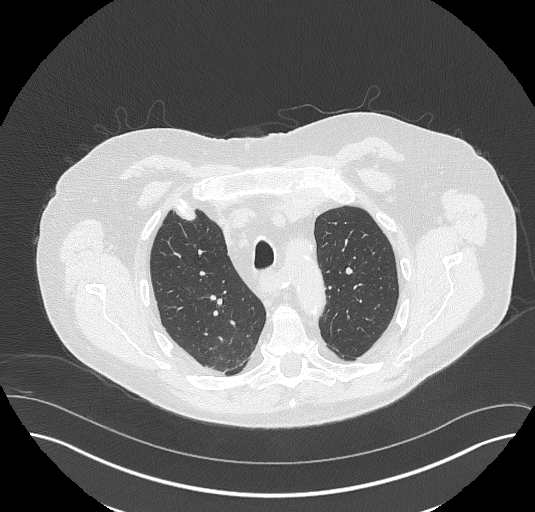
[im 276/326  mediastinal]
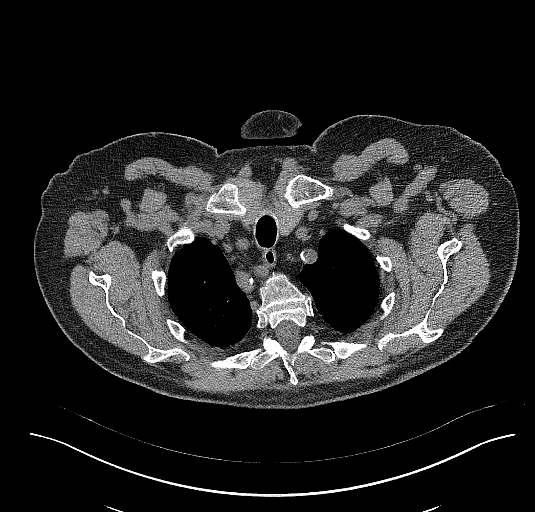
[im 276/326  lung]
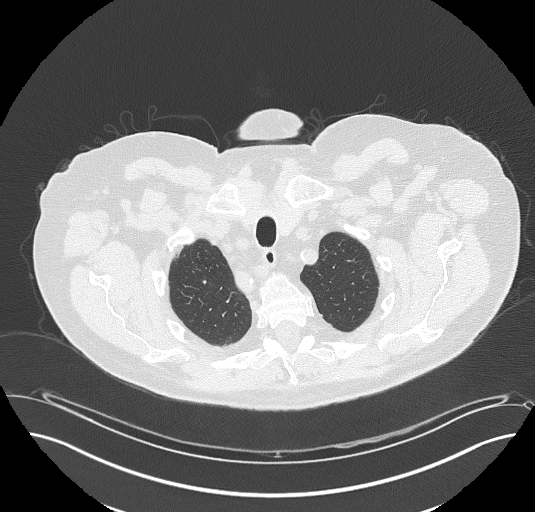
[im 301/326  lung]
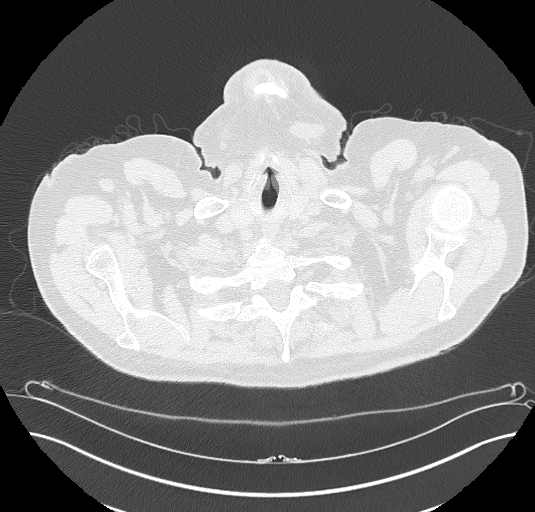

[13 of 36 positions shown; findings below may reference images not displayed]

FINDINGS: Cardiovascular: Heart size is normal. There is no significant
pericardial fluid, thickening or pericardial calcification. There is
aortic atherosclerosis, as well as atherosclerosis of the great
vessels of the mediastinum and the coronary arteries, including
calcified atherosclerotic plaque in the left main, left anterior
descending, left circumflex and right coronary arteries. Aberrant
right subclavian artery (normal anatomical variant) incidentally
noted.

Mediastinum/Nodes: No pathologically enlarged mediastinal or hilar
lymph nodes. Please note that accurate exclusion of hilar adenopathy
is limited on noncontrast CT scans. Esophagus is unremarkable in
appearance. No axillary lymphadenopathy.

Lungs/Pleura: High-resolution images demonstrates some patchy areas
of ground-glass attenuation, septal thickening, a small amount of
subpleural reticulation, thickening of the peribronchovascular
interstitium throughout the lungs bilaterally along with some mild
peripheral cylindrical bronchiectasis and bronchiolectasis, most
evident in the mid to lower lungs, as well as some regional
architectural distortion most evident in the right upper lobe. No
frank honeycombing. Inspiratory and expiratory imaging is
unremarkable. Compared to the recent prior examination there are new
somewhat nodular areas of architectural distortion, volume loss and
what appears to be some airspace consolidation in the posterior
aspect of the right upper lobe, including a thick-walled cavitary
area which currently measures 2.1 x 1.2 cm (axial image 59 of series
8) in a location where there was previously a small thin-walled
cavitary region. No pleural effusions. Small calcified granuloma in
the posterolateral aspect of the left lower lobe incidentally noted.

Upper Abdomen: Aortic atherosclerosis.

Musculoskeletal: There are no aggressive appearing lytic or blastic
lesions noted in the visualized portions of the skeleton.
IMPRESSION: 1. The appearance of the lungs is suggestive of interstitial lung
disease, with a spectrum of findings categorized as probable usual
interstitial pneumonia (UIP) per current ATS guidelines. Repeat
high-resolution chest CT is recommended in 12 months to assess for
temporal changes in the appearance of the lung parenchyma.
2. In addition, there appears to be a probable bronchopneumonia in
the posterior aspect of the right upper lobe where there are some
nodular areas of airspace consolidation associated with some
pre-existing cavitary regions. Given the rapid development over the
past 2 months this is not favored to be neoplastic, but close
attention at time of follow-up study is recommended to ensure the
regression of these findings.
3. Aortic atherosclerosis, in addition to left main and 3 vessel
coronary artery disease.

Aortic Atherosclerosis ([id number]).

## 2022-01-19 ENCOUNTER — Other Ambulatory Visit: Payer: Self-pay | Admitting: Surgery

## 2022-01-19 DIAGNOSIS — D696 Thrombocytopenia, unspecified: Secondary | ICD-10-CM

## 2022-01-20 ENCOUNTER — Other Ambulatory Visit: Payer: Self-pay

## 2022-01-20 ENCOUNTER — Inpatient Hospital Stay: Payer: Medicare HMO | Attending: Physician Assistant

## 2022-01-20 DIAGNOSIS — D7589 Other specified diseases of blood and blood-forming organs: Secondary | ICD-10-CM | POA: Diagnosis not present

## 2022-01-20 DIAGNOSIS — D696 Thrombocytopenia, unspecified: Secondary | ICD-10-CM | POA: Insufficient documentation

## 2022-01-20 LAB — CMP (CANCER CENTER ONLY)
ALT: 12 U/L (ref 0–44)
AST: 14 U/L — ABNORMAL LOW (ref 15–41)
Albumin: 3.9 g/dL (ref 3.5–5.0)
Alkaline Phosphatase: 59 U/L (ref 38–126)
Anion gap: 3 — ABNORMAL LOW (ref 5–15)
BUN: 22 mg/dL (ref 8–23)
CO2: 34 mmol/L — ABNORMAL HIGH (ref 22–32)
Calcium: 9.4 mg/dL (ref 8.9–10.3)
Chloride: 98 mmol/L (ref 98–111)
Creatinine: 1.25 mg/dL — ABNORMAL HIGH (ref 0.61–1.24)
GFR, Estimated: 57 mL/min — ABNORMAL LOW (ref >60)
Glucose, Bld: 94 mg/dL (ref 70–99)
Potassium: 4.4 mmol/L (ref 3.5–5.1)
Sodium: 135 mmol/L (ref 135–145)
Total Bilirubin: 1.4 mg/dL — ABNORMAL HIGH (ref 0.3–1.2)
Total Protein: 6.1 g/dL — ABNORMAL LOW (ref 6.5–8.1)

## 2022-01-20 LAB — CBC WITH DIFFERENTIAL (CANCER CENTER ONLY)
Abs Immature Granulocytes: 0.1 10*3/uL — ABNORMAL HIGH (ref 0.00–0.07)
Basophils Absolute: 0 10*3/uL (ref 0.0–0.1)
Basophils Relative: 0 %
Eosinophils Absolute: 0 10*3/uL (ref 0.0–0.5)
Eosinophils Relative: 0 %
HCT: 37 % — ABNORMAL LOW (ref 39.0–52.0)
Hemoglobin: 13 g/dL (ref 13.0–17.0)
Immature Granulocytes: 1 %
Lymphocytes Relative: 21 %
Lymphs Abs: 1.7 10*3/uL (ref 0.7–4.0)
MCH: 37.1 pg — ABNORMAL HIGH (ref 26.0–34.0)
MCHC: 35.1 g/dL (ref 30.0–36.0)
MCV: 105.7 fL — ABNORMAL HIGH (ref 80.0–100.0)
Monocytes Absolute: 0.9 10*3/uL (ref 0.1–1.0)
Monocytes Relative: 11 %
Neutro Abs: 5.6 10*3/uL (ref 1.7–7.7)
Neutrophils Relative %: 67 %
Platelet Count: 135 10*3/uL — ABNORMAL LOW (ref 150–400)
RBC: 3.5 MIL/uL — ABNORMAL LOW (ref 4.22–5.81)
RDW: 15.5 % (ref 11.5–15.5)
WBC Count: 8.4 10*3/uL (ref 4.0–10.5)
nRBC: 0 % (ref 0.0–0.2)

## 2022-01-23 ENCOUNTER — Telehealth: Payer: Self-pay

## 2022-01-23 NOTE — Telephone Encounter (Signed)
Pt advised with VU.  Per pt's request lab results mail to pt.

## 2022-01-23 NOTE — Telephone Encounter (Signed)
-----   Message from Lincoln Brigham, PA-C sent at 01/20/2022  9:55 PM EDT ----- Please notify patient that Hgb is back to normal and platelet counts are stable. No further intervention is needed.  ----- Message ----- From: Buel Ream, Lab In [place] Sent: 01/20/2022   9:21 AM EDT To: Lincoln Brigham, PA-C

## 2022-02-07 ENCOUNTER — Other Ambulatory Visit: Payer: Self-pay | Admitting: Cardiovascular Disease

## 2022-02-14 ENCOUNTER — Ambulatory Visit: Payer: Medicare HMO | Admitting: Cardiovascular Disease

## 2022-02-14 ENCOUNTER — Encounter: Payer: Self-pay | Admitting: Cardiovascular Disease

## 2022-02-14 VITALS — BP 108/60 | HR 67 | Ht 72.0 in | Wt 215.0 lb

## 2022-02-14 DIAGNOSIS — I5022 Chronic systolic (congestive) heart failure: Secondary | ICD-10-CM | POA: Diagnosis not present

## 2022-02-14 DIAGNOSIS — I2510 Atherosclerotic heart disease of native coronary artery without angina pectoris: Secondary | ICD-10-CM

## 2022-02-14 DIAGNOSIS — E782 Mixed hyperlipidemia: Secondary | ICD-10-CM

## 2022-02-14 MED ORDER — NITROGLYCERIN 0.4 MG SL SUBL: 0.4000 mg | SUBLINGUAL_TABLET | SUBLINGUAL | 5 refills | Status: DC | PRN

## 2022-02-14 NOTE — Patient Instructions (Signed)
Medication Instructions:  Your physician recommends that you continue on your current medications as directed. Please refer to the Current Medication list given to you today.  *If you need a refill on your cardiac medications before your next appointment, please call your pharmacy*   Lab Work: NONE If you have labs (blood work) drawn today and your tests are completely normal, you will receive your results only by: [place] (if you have MyChart) OR A paper copy in the mail If you have any lab test that is abnormal or we need to change your treatment, we will call you to review the results.   Testing/Procedures: NONE   Follow-Up: At [facility], you and your health needs are our priority.  As part of our continuing mission to provide you with exceptional heart care, we have created designated Provider Care Teams.  These Care Teams include your primary Cardiologist (physician) and Advanced Practice Providers (APPs -  Physician Assistants and Nurse Practitioners) who all work together to provide you with the care you need, when you need it.  We recommend signing up for the patient portal called "MyChart".  Sign up information is provided on this After Visit Summary.  MyChart is used to connect with patients for Virtual Visits (Telemedicine).  Patients are able to view lab/test results, encounter notes, upcoming appointments, etc.  Non-urgent messages can be sent to your provider as well.   To learn more about what you can do with MyChart, go to [email or web address].    Your next appointment:   1 year(s)  The format for your next appointment:   In Person  Provider:   Mertie Moores, Christen Bame, or Richardson Dopp, [place] {     Important Information About Sugar

## 2022-02-14 NOTE — Progress Notes (Signed)
Cardiology Office Note   Date:  02/14/2022   ID:  Jeffrey Stewart, DOB [birth year], MRN [id number]  PCP:  Heywood Bene, PA-C  Cardiologist: Mertie Moores, MD   Chief Complaint  Patient presents with   Coronary Artery Disease   Hyperlipidemia        1. Congestive heart failure-chronic systolic congestive heart failure Left ventricle: The cavity size was moderately to severely   dilated. Wall thickness was normal. Systolic function was   normal. The estimated ejection fraction was in the range   of 50% to 55%. Wall motion was normal; there were no   regional wall motion abnormalities. There was an increased   relative contribution of atrial contraction to ventricular   filling. - Aortic valve: Mild regurgitation. - Left atrium: The atrium was mildly dilated. - Right ventricle: The cavity size was moderately dilated.   Systolic function was mildly reduced. - Right atrium: The atrium was mildly dilated 2. RBBB 3. Mild coronary artery disease- The left anterior descending artery is moderately calcified.  There is a  mid 40%-50% stenosis right at the takeoff of the first diagonal artery. D1 - moderate stenosis.   The left circumflex artery is large and is dominant.   80-90% PDA stenosis   The ramus intermediate vessel has minor luminal irregularities.  The right coronary artery is small, it is nondominant.   LV gram -  EF  35-40%.  There is hypokinesis of the anterior wall,  apex, and inferoapical wall.  The aortic root appears to be moderately  dilated.  4. Hyperlipidemia   87 yo gentleman with a hx of mild CHF.     He walks occasionally.  He has been exercising on a regular basis. He works out in his garden and walks on his treadmill in the evenings.   He's not had any significant episodes of chest pain or shortness breath.   Feb. 6, 2014  Jeffrey Stewart is doing well since I last saw him. He's not had any episodes of chest pain or shortness breath. He's exercising on a  regular basis.  March 10, 2013:  Jeffrey Stewart is doing well. He's not had any episodes of chest pain or shortness breath.  Playing golf, fishing, gardening, boating this past summer.   Tries to avoid salt.  No dypsnea.    Feb. 24, 2015:  Pt is doing OK.  No CP.  He has some abdominal issues - has 2 hernias.  Will be seen the surgeons today.  Sept. 3, 2015:  Jeffrey Stewart is doing well.  Had his hernias repaired No CP, no dyspnea.  Staying active.  Walks regularly.    September 15, 2014:   Jeffrey Stewart is a 87 y.o. male who presents for follow up of his CHF Occasional DOE, no CP.    Aug. 30, 2016:  Doing well.  Has some allergies . No CP or dyspnea.  Works out in the garden   Feb. 28, 2017:  Doing well.  Working in his garden   Oct. 17, 2017: Has had a rough summer. Injured his shoulders working on his floating dock this summer.  Now he is having aches all over , thinks its the atorvastatin  Lots of leg and feet swelling   Jan. 11, 2019:   history of chronic systolic congestive heart failure.  His last echocardiogram reveals normal left ventricular systolic function. He still has some occasional shortness of breath with exertion. He does not exercise quite as much as  he used to.  Is raising his great grandchildren .  January 28, 2018: Doing ok Raising 2 of his great grandchildren - 2 teenage girls. No CP,  Some Doe, Raising his garden     Jan.. 13, 2020 No CP ,  doingwell Breathing is ok Stays busy - raises his great grandchildren    21  Enjoys her garden .    Jan. 15, 2021 Doing well.   bp is  A bit low today  Eating and drinking well  No CP or dyspnea   Jan. 3, 2022 Jeffrey Stewart is seen today for follow up of his CHF Has been walking 1-1.5 miles a day .  No cp, no significant dyspnea  His greatgrandchildren are not living with him.   They were placed in a foster home and have run away .   He does not know how to contact them   August 09, 2021:   Jeffrey Stewart is seen today for  follow-up visit.  His health has generally declined since I last saw him a year ago. Has interstitial lung disease , pulmonary fibrosis  No cp  Unable to walk  February 14, 2022: Jeffrey Stewart seen today for a follow-up of his coronary artery disease.  He has interstitial lung disease and pulmonary fibrosis. Breathing is still a challenge  Uses his nebulizer several times a day  No CP ,   Past Medical History:  Diagnosis Date   Allergy    Arthritis    MINOR ARTHRITIS FEET AND TOES   Asthma    Cataract    CHF (congestive heart failure) ([place]) 11/2009   EF45-50%   Coronary artery disease    Hyperlipidemia    Myocardial infarction ([place])    Pneumonia    HX OF PNEUMONIA 4 OR 5 YRS AGO   Rosacea    OF FACE   Shortness of breath    SOMETIMES SOB OR WHEEZING WITH EXERTION    Past Surgical History:  Procedure Laterality Date   APPENDECTOMY     AGE 84   CARDIAC CATHETERIZATION  11/2009   mild / mod cad,MODERATE TIGHT STENOSIS IN THE DISTAL LEFT CIRCUMFLEX ARTERY   ELECTROCARDIOGRAM  11/2009   RBBB   EYE SURGERY     BILATERAL CATARACT EXTRACT EXTRACTION WITH IMPLANTS   INGUINAL HERNIA REPAIR Bilateral 09/29/2013   Procedure: LAPAROSCOPIC BILATERAL  INGUINAL HERNIA;  Surgeon: [name], MD;  Location: [facility];  Service: General;  Laterality: Bilateral;   INSERTION OF MESH Bilateral 09/29/2013   Procedure: INSERTION OF MESH;  Surgeon: [name], MD;  Location: [facility];  Service: General;  Laterality: Bilateral;   last echo 02/2009     last nuc 2009  12/16/2007   EF 33%. ABNORMAL. HE HAS APICAL DEFECT CONSISTENT WITH A SCAR.LV FUNCTION MODERATELY  DEPRESSED   US ECHOCARDIOGRAPHY  02/23/2009   EF 45-50%   US ECHOCARDIOGRAPHY  12/27/2007   EF 45-50%     Current Outpatient Medications  Medication Sig Dispense Refill   acetaminophen (TYLENOL) 500 MG tablet Take 1,000 mg by mouth every 6 (six) hours as needed for moderate pain.     albuterol (PROVENTIL HFA;VENTOLIN HFA) 108 (90 BASE)  MCG/ACT inhaler Inhale 1 puff into the lungs every 6 (six) hours as needed for wheezing.      aspirin EC 81 MG tablet Take 81 mg by mouth every evening.     atorvastatin (LIPITOR) 40 MG tablet TAKE 1 TABLET AT BEDTIME 90 tablet 1  brimonidine (ALPHAGAN) 0.2 % ophthalmic solution 1 drop 2 (two) times daily. 1 drop 2 (two) times daily.     carvedilol (COREG) 12.5 MG tablet TAKE 1 TABLET TWICE DAILY WITH MEALS 180 tablet 3   cetirizine (ZYRTEC) 10 MG tablet Take 10 mg by mouth at bedtime.     cholecalciferol (VITAMIN D) 1000 UNITS tablet Take 1,000 Units by mouth daily.     fish oil-omega-3 fatty acids 1000 MG capsule Take 1 g by mouth 2 (two) times daily.     fluticasone (FLONASE) 50 MCG/ACT nasal spray Place 1-2 sprays into both nostrils daily.     Fluticasone-Salmeterol (ADVAIR) 250-50 MCG/DOSE AEPB Inhale 1 puff into the lungs 2 (two) times daily.     glucosamine-chondroitin 500-400 MG tablet Take 1 tablet by mouth daily.     ipratropium-albuterol (DUONEB) 0.5-2.5 (3) MG/3ML SOLN SMARTSIG:3 Milliliter(s) Via Nebulizer Every 6 Hours PRN     latanoprost (XALATAN) 0.005 % ophthalmic solution 1 drop at bedtime.     metroNIDAZOLE (METROCREAM) 0.75 % cream Apply 1 application topically 2 (two) times daily.      minocycline (DYNACIN) 50 MG tablet Take 50 mg by mouth 2 (two) times daily.     NEOMYCIN-POLYMYXIN-HYDROCORTISONE (CORTISPORIN) 1 % SOLN OTIC solution Place 1 drop into both ears as needed.     nitroGLYCERIN (NITROSTAT) 0.4 MG SL tablet Place 1 tablet (0.4 mg total) under the tongue every 5 (five) minutes as needed for chest pain. 25 tablet 5   Polyethyl Glycol-Propyl Glycol 0.4-0.3 % SOLN Place 1 drop into both eyes 2 (two) times daily as needed (dry eyes).     potassium chloride SA (KLOR-CON M) 20 MEQ tablet TAKE 1 TABLET EVERY DAY 90 tablet 1   predniSONE (DELTASONE) 5 MG tablet TAKE 5 MG TABLET BY MOUTH IN THE MORNING AND 1 MG TABLET BY MOUTH AT BEDTIME     sodium chloride (OCEAN) 0.65 %  nasal spray Place 1 spray into the nose as needed for congestion.     torsemide (DEMADEX) 20 MG tablet Take 1 tablet (20 mg total) by mouth daily. 90 tablet 2   lisinopril (ZESTRIL) 2.5 MG tablet Take 1 tablet (2.5 mg total) by mouth daily. (Patient not taking: Reported on 02/14/2022) 90 tablet 3   No current facility-administered medications for this visit.    Allergies:   Niaspan [niacin] and Meloxicam    Social History:  The patient  reports that he has never smoked. He has never used smokeless tobacco. He reports that he does not currently use alcohol. He reports that he does not use drugs.   Family History:  The patient's family history includes Diabetes in his sister; Lung cancer in his sister.    ROS:   Noted in current hx.  Otherwise negative.    Physical Exam: Blood pressure 108/60, pulse 67, height 6' (1.829 m), weight 215 lb (97.5 kg), SpO2 94 %.  GEN:  Well nourished, well developed in no acute distress HEENT: Normal NECK: No JVD; No carotid bruits LYMPHATICS: No lymphadenopathy CARDIAC: RRR , no murmurs, rubs, gallops RESPIRATORY:  Clear to auscultation without rales, wheezing or rhonchi  ABDOMEN: Soft, non-tender, non-distended MUSCULOSKELETAL:  No edema; No deformity  SKIN: Warm and dry NEUROLOGIC:  Alert and oriented x 3   EKG:    .   Recent Labs: 08/02/2021: NT-Pro BNP 108 01/20/2022: ALT 12; BUN 22; Creatinine 1.25; Hemoglobin 13.0; Platelet Count 135; Potassium 4.4; Sodium 135    Lipid Panel  Component Value Date/Time   CHOL 121 08/09/2021 1224   TRIG 149 08/09/2021 1224   HDL 39 (L) 08/09/2021 1224   CHOLHDL 3.1 08/09/2021 1224   CHOLHDL 3.7 05/02/2016 0856   VLDL 15 05/02/2016 0856   LDLCALC 56 08/09/2021 1224      Wt Readings from Last 3 Encounters:  02/14/22 215 lb (97.5 kg)  10/21/21 219 lb 11.2 oz (99.7 kg)  10/17/21 218 lb (98.9 kg)      Other studies Reviewed: Additional studies/ records that were reviewed today include:  . Review of the above records demonstrates:    ASSESSMENT AND PLAN:  1. Congestive heart failure-chronic systolic congestive heart failure Seems to be stable ,  has RV failure from his pulmonary HTN    2. RBBB-     3. Mild coronary artery disease-    no angina     4. Hyperlipidemia  -   last lipids from Jan look good.  LDL was 56  5.   Interstitial Lung disease.  Has significant leg / feet swelling . On demadex He doubles the demadex on occasion wihen his feet are swollen    Current medicines are reviewed at length with the patient today.  The patient does not have concerns regarding medicines.  The following changes have been made:  no change  Disposition:       Signed, Mertie Moores, MD  02/14/2022 9:48 AM    [facility] [address], [place], [place]  [address] Phone: [phone]; Fax: [phone]

## 2022-04-24 ENCOUNTER — Other Ambulatory Visit: Payer: Self-pay | Admitting: Hematology and Oncology

## 2022-04-24 ENCOUNTER — Other Ambulatory Visit: Payer: Self-pay

## 2022-04-24 ENCOUNTER — Inpatient Hospital Stay: Payer: Medicare HMO | Attending: Physician Assistant | Admitting: Hematology and Oncology

## 2022-04-24 ENCOUNTER — Inpatient Hospital Stay: Payer: Medicare HMO

## 2022-04-24 VITALS — BP 114/66 | HR 68 | Temp 97.6°F | Resp 16 | Wt 215.0 lb

## 2022-04-24 DIAGNOSIS — D696 Thrombocytopenia, unspecified: Secondary | ICD-10-CM | POA: Diagnosis present

## 2022-04-24 DIAGNOSIS — D72829 Elevated white blood cell count, unspecified: Secondary | ICD-10-CM | POA: Diagnosis not present

## 2022-04-24 DIAGNOSIS — R161 Splenomegaly, not elsewhere classified: Secondary | ICD-10-CM | POA: Diagnosis not present

## 2022-04-24 DIAGNOSIS — Z7952 Long term (current) use of systemic steroids: Secondary | ICD-10-CM | POA: Diagnosis not present

## 2022-04-24 DIAGNOSIS — D7589 Other specified diseases of blood and blood-forming organs: Secondary | ICD-10-CM

## 2022-04-24 DIAGNOSIS — Z801 Family history of malignant neoplasm of trachea, bronchus and lung: Secondary | ICD-10-CM | POA: Insufficient documentation

## 2022-04-24 DIAGNOSIS — D539 Nutritional anemia, unspecified: Secondary | ICD-10-CM | POA: Insufficient documentation

## 2022-04-24 LAB — CBC WITH DIFFERENTIAL (CANCER CENTER ONLY)
Abs Immature Granulocytes: 0.15 10*3/uL — ABNORMAL HIGH (ref 0.00–0.07)
Basophils Absolute: 0 10*3/uL (ref 0.0–0.1)
Basophils Relative: 0 %
Eosinophils Absolute: 0 10*3/uL (ref 0.0–0.5)
Eosinophils Relative: 0 %
HCT: 37.9 % — ABNORMAL LOW (ref 39.0–52.0)
Hemoglobin: 13.2 g/dL (ref 13.0–17.0)
Immature Granulocytes: 1 %
Lymphocytes Relative: 13 %
Lymphs Abs: 1.6 10*3/uL (ref 0.7–4.0)
MCH: 36.7 pg — ABNORMAL HIGH (ref 26.0–34.0)
MCHC: 34.8 g/dL (ref 30.0–36.0)
MCV: 105.3 fL — ABNORMAL HIGH (ref 80.0–100.0)
Monocytes Absolute: 0.9 10*3/uL (ref 0.1–1.0)
Monocytes Relative: 7 %
Neutro Abs: 9.2 10*3/uL — ABNORMAL HIGH (ref 1.7–7.7)
Neutrophils Relative %: 79 %
Platelet Count: 146 10*3/uL — ABNORMAL LOW (ref 150–400)
RBC: 3.6 MIL/uL — ABNORMAL LOW (ref 4.22–5.81)
RDW: 15.5 % (ref 11.5–15.5)
WBC Count: 11.8 10*3/uL — ABNORMAL HIGH (ref 4.0–10.5)
nRBC: 0 % (ref 0.0–0.2)

## 2022-04-24 LAB — CMP (CANCER CENTER ONLY)
ALT: 11 U/L (ref 0–44)
AST: 12 U/L — ABNORMAL LOW (ref 15–41)
Albumin: 3.6 g/dL (ref 3.5–5.0)
Alkaline Phosphatase: 61 U/L (ref 38–126)
Anion gap: 2 — ABNORMAL LOW (ref 5–15)
BUN: 31 mg/dL — ABNORMAL HIGH (ref 8–23)
CO2: 34 mmol/L — ABNORMAL HIGH (ref 22–32)
Calcium: 9.1 mg/dL (ref 8.9–10.3)
Chloride: 98 mmol/L (ref 98–111)
Creatinine: 1.3 mg/dL — ABNORMAL HIGH (ref 0.61–1.24)
GFR, Estimated: 54 mL/min — ABNORMAL LOW (ref >60)
Glucose, Bld: 108 mg/dL — ABNORMAL HIGH (ref 70–99)
Potassium: 5.3 mmol/L — ABNORMAL HIGH (ref 3.5–5.1)
Sodium: 134 mmol/L — ABNORMAL LOW (ref 135–145)
Total Bilirubin: 1 mg/dL (ref 0.3–1.2)
Total Protein: 6 g/dL — ABNORMAL LOW (ref 6.5–8.1)

## 2022-04-24 NOTE — Progress Notes (Unsigned)
[facility] [phone]   [phone]) [phone]  PROGRESS NOTE  Patient Care Team: Merwyn Katos as PCP - General (Physician Assistant) Nahser, Wonda Cheng, MD as PCP - Cardiology (Cardiology) Fanny Skates, MD as Consulting Physician (General Surgery)  Hematological/Oncological History 1) Labs from PCP, Tia McPhatter: -12/29/2020: WBC 9.8, Hgb 14.4, MCV 103.7, Plt 168,  Band Abs 0.8 (H), Myelo Abs 0.4 (H) -01/06/2021: WBC 9.8, Hgb 14.2, MCV 103.4, Plt 207, Mono Manuel Ab 1.1 (H), Meta Absolute 0.2 (H)  2) 01/21/2021: Establish care with Dede Query PA-C  CHIEF COMPLAINTS/PURPOSE OF CONSULTATION:  -Elevated metamyelocytes -Thrombocytopenia -Macrocytosis  HISTORY OF PRESENTING ILLNESS:  Jeffrey Stewart 87 y.o. male returns for a follow up. Patient is accompanied by his wife for this visit. He was last seen in clinic on 04/22/2021. In the interim, he denies any changes to his health.   Today, Ms. Hellberg reports slightly more fatigued than baseline but he continues to complete his daily activities on his own. He has a good appetite without any weight loss. He denies nausea, vomiting or abdominal pain. His bowel habits are unchanged without any recurrent diarrhea or constipation. He has chronic back pain that is managed with muscle relaxants and periodic steroid injections. He has chronic shortness of breath with exertion and he uses a nebulizer as need with relief. He continues to have easy bruising but no signs of active bleeding. He reports improved bilateral lower extremity edema and he is compliant with wearing compression stockings. He denies any fevers, chills, night sweats, chest pain or cough. He has no other complaints. Rest of the 10 point ROS is below.   MEDICAL HISTORY:  Past Medical History:  Diagnosis Date   Allergy    Arthritis    MINOR ARTHRITIS FEET AND TOES   Asthma    Cataract    CHF (congestive heart failure) ([place]) 11/2009    EF45-50%   Coronary artery disease    Hyperlipidemia    Myocardial infarction ([place])    Pneumonia    HX OF PNEUMONIA 4 OR 5 YRS AGO   Rosacea    OF FACE   Shortness of breath    SOMETIMES SOB OR WHEEZING WITH EXERTION    SURGICAL HISTORY: Past Surgical History:  Procedure Laterality Date   APPENDECTOMY     AGE 23   CARDIAC CATHETERIZATION  11/2009   mild / mod cad,MODERATE TIGHT STENOSIS IN THE DISTAL LEFT CIRCUMFLEX ARTERY   ELECTROCARDIOGRAM  11/2009   RBBB   EYE SURGERY     BILATERAL CATARACT EXTRACT EXTRACTION WITH IMPLANTS   INGUINAL HERNIA REPAIR Bilateral 09/29/2013   Procedure: LAPAROSCOPIC BILATERAL  INGUINAL HERNIA;  Surgeon: [name], MD;  Location: [facility];  Service: General;  Laterality: Bilateral;   INSERTION OF MESH Bilateral 09/29/2013   Procedure: INSERTION OF MESH;  Surgeon: [name], MD;  Location: [facility];  Service: General;  Laterality: Bilateral;   last echo 02/2009     last nuc 2009  12/16/2007   EF 33%. ABNORMAL. HE HAS APICAL DEFECT CONSISTENT WITH A SCAR.LV FUNCTION MODERATELY  DEPRESSED   US ECHOCARDIOGRAPHY  02/23/2009   EF 45-50%   US ECHOCARDIOGRAPHY  12/27/2007   EF 45-50%    SOCIAL HISTORY: Social History   Socioeconomic History   Marital status: Married    Spouse name: Not on file   Number of children: Not on file   Years of education: Not on file   Highest education  level: Not on file  Occupational History   Not on file  Tobacco Use   Smoking status: Never   Smokeless tobacco: Never  Vaping Use   Vaping Use: Never used  Substance and Sexual Activity   Alcohol use: Not Currently    Comment: 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   Drug use: No   Sexual activity: Not on file  Other Topics Concern   Not on file  Social History Narrative   Not on file   Social Determinants of Health   Financial Resource Strain: Not on file  Food Insecurity: Not on file  Transportation Needs: Not on file  Physical Activity: Not on file  Stress:  Not on file  Social Connections: Not on file  Intimate Partner Violence: Not on file    FAMILY HISTORY: Family History  Problem Relation Age of Onset   Diabetes Sister    Lung cancer Sister        smoker    ALLERGIES:  is allergic to niaspan [niacin] and meloxicam.  MEDICATIONS:  Current Outpatient Medications  Medication Sig Dispense Refill   acetaminophen (TYLENOL) 500 MG tablet Take 1,000 mg by mouth every 6 (six) hours as needed for moderate pain.     albuterol (PROVENTIL HFA;VENTOLIN HFA) 108 (90 BASE) MCG/ACT inhaler Inhale 1 puff into the lungs every 6 (six) hours as needed for wheezing.      aspirin EC 81 MG tablet Take 81 mg by mouth every evening.     atorvastatin (LIPITOR) 40 MG tablet TAKE 1 TABLET AT BEDTIME 90 tablet 1   brimonidine (ALPHAGAN) 0.2 % ophthalmic solution 1 drop 2 (two) times daily. 1 drop 2 (two) times daily.     carvedilol (COREG) 12.5 MG tablet TAKE 1 TABLET TWICE DAILY WITH MEALS 180 tablet 3   cetirizine (ZYRTEC) 10 MG tablet Take 10 mg by mouth at bedtime.     cholecalciferol (VITAMIN D) 1000 UNITS tablet Take 1,000 Units by mouth daily.     fish oil-omega-3 fatty acids 1000 MG capsule Take 1 g by mouth 2 (two) times daily.     fluticasone (FLONASE) 50 MCG/ACT nasal spray Place 1-2 sprays into both nostrils daily.     Fluticasone-Salmeterol (ADVAIR) 250-50 MCG/DOSE AEPB Inhale 1 puff into the lungs 2 (two) times daily.     glucosamine-chondroitin 500-400 MG tablet Take 1 tablet by mouth daily.     ipratropium-albuterol (DUONEB) 0.5-2.5 (3) MG/3ML SOLN SMARTSIG:3 Milliliter(s) Via Nebulizer Every 6 Hours PRN     latanoprost (XALATAN) 0.005 % ophthalmic solution 1 drop at bedtime.     lisinopril (ZESTRIL) 2.5 MG tablet Take 1 tablet (2.5 mg total) by mouth daily. (Patient not taking: Reported on 02/14/2022) 90 tablet 3   metroNIDAZOLE (METROCREAM) 0.75 % cream Apply 1 application topically 2 (two) times daily.      minocycline (DYNACIN) 50 MG tablet  Take 50 mg by mouth 2 (two) times daily.     NEOMYCIN-POLYMYXIN-HYDROCORTISONE (CORTISPORIN) 1 % SOLN OTIC solution Place 1 drop into both ears as needed.     nitroGLYCERIN (NITROSTAT) 0.4 MG SL tablet Place 1 tablet (0.4 mg total) under the tongue every 5 (five) minutes as needed for chest pain. 25 tablet 5   Polyethyl Glycol-Propyl Glycol 0.4-0.3 % SOLN Place 1 drop into both eyes 2 (two) times daily as needed (dry eyes).     potassium chloride SA (KLOR-CON M) 20 MEQ tablet TAKE 1 TABLET EVERY DAY 90 tablet 1   predniSONE (  DELTASONE) 5 MG tablet TAKE 5 MG TABLET BY MOUTH IN THE MORNING AND 1 MG TABLET BY MOUTH AT BEDTIME     sodium chloride (OCEAN) 0.65 % nasal spray Place 1 spray into the nose as needed for congestion.     torsemide (DEMADEX) 20 MG tablet Take 1 tablet (20 mg total) by mouth daily. 90 tablet 2   No current facility-administered medications for this visit.    REVIEW OF SYSTEMS:   Constitutional: ( - ) fevers, ( - )  chills , ( - ) night sweats Eyes: ( - ) blurriness of vision, ( - ) double vision, ( - ) watery eyes Ears, nose, mouth, throat, and face: ( - ) mucositis, ( - ) sore throat Respiratory: ( -) cough, ( - ) dyspnea, ( -) wheezes  Cardiovascular: ( - ) palpitation, ( - ) chest discomfort, ( + ) lower extremity swelling Gastrointestinal:  ( - ) nausea, ( - ) heartburn, ( - ) change in bowel habits Skin: ( - ) abnormal skin rashes Lymphatics: ( - ) new lymphadenopathy, ( - ) easy bruising Neurological: ( - ) numbness, ( - ) tingling, ( - ) new weaknesses Behavioral/Psych: ( - ) mood change, ( - ) new changes  All other systems were reviewed with the patient and are negative.  PHYSICAL EXAMINATION: ECOG PERFORMANCE STATUS: 1 - Symptomatic but completely ambulatory  Vitals:   04/24/22 0942  BP: 114/66  Pulse: 68  Resp: 16  Temp: 97.6 F (36.4 C)  SpO2: 98%   Filed Weights   04/24/22 0942  Weight: 215 lb (97.5 kg)    GENERAL: well appearing male in NAD   SKIN: skin color, texture, turgor are normal, no rashes or significant lesions. Bruising over both arms.  EYES: conjunctiva are pink and non-injected, sclera clear OROPHARYNX: no exudate, no erythema; lips, buccal mucosa, and tongue normal  LUNGS: clear to auscultation and percussion with normal breathing effort HEART: regular rate & rhythm and no murmurs. Edema in ankles and distal lower leg bilaterally.  ABDOMEN: soft, non-tender, non-distended, normal bowel sounds Musculoskeletal: no cyanosis of digits and no clubbing  PSYCH: alert & oriented x 3, fluent speech NEURO: no focal motor/sensory deficits  LABORATORY DATA:  I have reviewed the data as listed    Latest Ref Rng & Units 04/24/2022    9:28 AM 01/20/2022    9:16 AM 10/21/2021    9:52 AM  CBC  WBC 4.0 - 10.5 K/uL 11.8  8.4  8.0   Hemoglobin 13.0 - 17.0 g/dL 13.2  13.0  12.7   Hematocrit 39.0 - 52.0 % 37.9  37.0  38.2   Platelets 150 - 400 K/uL 146  135  133        Latest Ref Rng & Units 01/20/2022    9:16 AM 10/21/2021    9:52 AM 08/09/2021   12:24 PM  CMP  Glucose 70 - 99 mg/dL 94  99  122   BUN 8 - 23 mg/dL _0 Creatinine 0.61 - 1.24 mg/dL 1.25  1.15  1.30   Sodium 135 - 145 mmol/L 135  134  140   Potassium 3.5 - 5.1 mmol/L 4.4  4.4  4.1   Chloride 98 - 111 mmol/L 98  101  98   CO2 22 - 32 mmol/L 34  30  30   Calcium 8.9 - 10.3 mg/dL 9.4  9.1  9.4   Total Protein 6.5 -  8.1 g/dL 6.1  6.0    Total Bilirubin 0.3 - 1.2 mg/dL 1.4  1.0    Alkaline Phos 38 - 126 U/L 59  54    AST 15 - 41 U/L 14  13    ALT 0 - 44 U/L _0 ASSESSMENT & PLAN REMIEL CORTI is a 87 y.o. male that presents to the clinic for a follow up for the following:  #Leukocytosis, neutrophil predominant-resolved: --Likely secondary to prior recent pneumonia diagnosis  --Patient was prescribed prednisone x 5 days --He denies any signs of active infection at this time.  --Monitor for now.   #Macrocytic anemia: --Etiology  unknown --Patient is not taking any medication that could cause macrocytosis --Patient has not consumed alcohol recently but reports prior heavy consumption --Labs from 01/21/2021 showed no evidence of vitamin B12 or folate deficiency.  --Today there is is mild anemia with Hgb 13.2 --Consider bone marrow biopsy if anemia worsens.   #Thrombocytopenia: -Mild and levels oscillate back and forth to normal. Today's Platelet count is 146K -Record review shows CT scan from 08/28/2013 that shows mild splenomegaly.  -Abdominal US confirms continued splenomegaly.   Follow up:  -follow up visit in 12 months   No orders of the defined types were placed in this encounter.    All questions were answered. The patient knows to call the clinic with any problems, questions or concerns.  I have spent a total of 25 minutes minutes of face-to-face and non-face-to-face time, preparing to see the patient, performing a medically appropriate examination, counseling and educating the patient, ordering tests, documenting clinical information in the electronic health record,and care coordination.   Dede Query, PA-C Department of Hematology/Oncology [place] [facility] Phone: [phone]

## 2022-05-30 ENCOUNTER — Ambulatory Visit
Admission: RE | Admit: 2022-05-30 | Discharge: 2022-05-30 | Disposition: A | Discharge status: Home / Self Care | Payer: Medicare HMO | Source: Ambulatory Visit | Attending: Pulmonary Disease | Admitting: Pulmonary Disease

## 2022-05-30 DIAGNOSIS — J849 Interstitial pulmonary disease, unspecified: Secondary | ICD-10-CM

## 2022-05-31 ENCOUNTER — Other Ambulatory Visit: Payer: Self-pay | Admitting: Cardiovascular Disease

## 2022-06-12 ENCOUNTER — Telehealth: Payer: Self-pay | Admitting: Pulmonary Disease

## 2022-06-12 NOTE — Progress Notes (Signed)
LMOM for patient to call back regarding CT chest scan.

## 2022-06-13 NOTE — Telephone Encounter (Signed)
Jeffrey Garfinkel, MD 06/09/2022  6:28 PM EST     CT shows stable scarring in the lung. I will discuss in detail at time of return clinic visit.    Called and spoke with pt letting him know the results of CT and he verbalized understanding. Nothing further needed.

## 2022-06-14 ENCOUNTER — Other Ambulatory Visit: Payer: Self-pay | Admitting: Cardiovascular Disease

## 2022-06-19 ENCOUNTER — Other Ambulatory Visit: Payer: Medicare HMO

## 2022-06-19 ENCOUNTER — Emergency Department (HOSPITAL_BASED_OUTPATIENT_CLINIC_OR_DEPARTMENT_OTHER)
Admission: EM | Admit: 2022-06-19 | Discharge: 2022-06-19 | Disposition: A | Discharge status: Home / Self Care | Payer: Medicare HMO | Attending: Emergency Medicine | Admitting: Emergency Medicine

## 2022-06-19 ENCOUNTER — Emergency Department (HOSPITAL_BASED_OUTPATIENT_CLINIC_OR_DEPARTMENT_OTHER): Payer: Medicare HMO | Admitting: Radiology

## 2022-06-19 ENCOUNTER — Other Ambulatory Visit: Payer: Self-pay

## 2022-06-19 ENCOUNTER — Encounter (HOSPITAL_BASED_OUTPATIENT_CLINIC_OR_DEPARTMENT_OTHER): Payer: Self-pay

## 2022-06-19 DIAGNOSIS — J849 Interstitial pulmonary disease, unspecified: Secondary | ICD-10-CM | POA: Insufficient documentation

## 2022-06-19 DIAGNOSIS — Z7951 Long term (current) use of inhaled steroids: Secondary | ICD-10-CM | POA: Diagnosis not present

## 2022-06-19 DIAGNOSIS — Z20822 Contact with and (suspected) exposure to covid-19: Secondary | ICD-10-CM | POA: Insufficient documentation

## 2022-06-19 DIAGNOSIS — R0602 Shortness of breath: Secondary | ICD-10-CM | POA: Diagnosis present

## 2022-06-19 DIAGNOSIS — I272 Pulmonary hypertension, unspecified: Secondary | ICD-10-CM | POA: Diagnosis not present

## 2022-06-19 DIAGNOSIS — Z7982 Long term (current) use of aspirin: Secondary | ICD-10-CM | POA: Diagnosis not present

## 2022-06-19 DIAGNOSIS — R062 Wheezing: Secondary | ICD-10-CM

## 2022-06-19 DIAGNOSIS — J441 Chronic obstructive pulmonary disease with (acute) exacerbation: Secondary | ICD-10-CM | POA: Insufficient documentation

## 2022-06-19 LAB — BASIC METABOLIC PANEL
Anion gap: 8 (ref 5–15)
BUN: 22 mg/dL (ref 8–23)
CO2: 29 mmol/L (ref 22–32)
Calcium: 9.4 mg/dL (ref 8.9–10.3)
Chloride: 99 mmol/L (ref 98–111)
Creatinine, Ser: 1.04 mg/dL (ref 0.61–1.24)
GFR, Estimated: >60 mL/min (ref >60)
Glucose, Bld: 151 mg/dL — ABNORMAL HIGH (ref 70–99)
Potassium: 3.8 mmol/L (ref 3.5–5.1)
Sodium: 136 mmol/L (ref 135–145)

## 2022-06-19 LAB — TROPONIN I (HIGH SENSITIVITY)
Troponin I (High Sensitivity): 5 ng/L (ref <18)
Troponin I (High Sensitivity): 5 ng/L (ref <18)

## 2022-06-19 LAB — CBC
HCT: 40.2 % (ref 39.0–52.0)
Hemoglobin: 14 g/dL (ref 13.0–17.0)
MCH: 37.1 pg — ABNORMAL HIGH (ref 26.0–34.0)
MCHC: 34.8 g/dL (ref 30.0–36.0)
MCV: 106.6 fL — ABNORMAL HIGH (ref 80.0–100.0)
Platelets: 157 10*3/uL (ref 150–400)
RBC: 3.77 MIL/uL — ABNORMAL LOW (ref 4.22–5.81)
RDW: 16.1 % — ABNORMAL HIGH (ref 11.5–15.5)
WBC: 9 10*3/uL (ref 4.0–10.5)
nRBC: 0 % (ref 0.0–0.2)

## 2022-06-19 LAB — RESP PANEL BY RT-PCR (FLU A&B, COVID) ARPGX2
Influenza A by PCR: NEGATIVE
Influenza B by PCR: NEGATIVE
SARS Coronavirus 2 by RT PCR: NEGATIVE

## 2022-06-19 LAB — BRAIN NATRIURETIC PEPTIDE: B Natriuretic Peptide: 61.7 pg/mL (ref 0.0–100.0)

## 2022-06-19 MED ORDER — PREDNISONE 20 MG PO TABS: ORAL_TABLET | ORAL | 0 refills | Status: DC

## 2022-06-19 MED ORDER — IPRATROPIUM BROMIDE 0.02 % IN SOLN
0.5000 mg | Freq: Once | RESPIRATORY_TRACT | Status: AC
  Administered 2022-06-19: 0.5 mg via RESPIRATORY_TRACT
  Filled 2022-06-19: qty 2.5

## 2022-06-19 MED ORDER — ALBUTEROL SULFATE (2.5 MG/3ML) 0.083% IN NEBU
5.0000 mg | INHALATION_SOLUTION | Freq: Once | RESPIRATORY_TRACT | Status: AC
  Administered 2022-06-19: 5 mg via RESPIRATORY_TRACT
  Filled 2022-06-19: qty 6

## 2022-06-19 MED ORDER — METHYLPREDNISOLONE SODIUM SUCC 125 MG IJ SOLR
125.0000 mg | Freq: Once | INTRAMUSCULAR | Status: AC
  Administered 2022-06-19: 125 mg via INTRAVENOUS
  Filled 2022-06-19: qty 2

## 2022-06-19 MED ORDER — ALBUTEROL SULFATE HFA 108 (90 BASE) MCG/ACT IN AERS: 2.0000 | INHALATION_SPRAY | RESPIRATORY_TRACT | 1 refills | Status: AC | PRN

## 2022-06-19 NOTE — Discharge Instructions (Signed)
It was our pleasure to provide your ER care today - we hope that you feel better.  Take prednisone as prescribed. Use albuterol mdi or neb treatment every 4 hours as need.  Follow up closely with your primary care doctor or pulmonary specialist in the next few days.  Return to ER right away if worse, new symptoms, fevers, increased trouble breathing, chest pain, or other concern.

## 2022-06-19 NOTE — ED Triage Notes (Signed)
Patient here POV from Home.  Endorses SOB that began today. No Pain or Fevers. History of Interstitial Lung Disease. Has attempted Nebulizer Treatments at 1300 today without much relief.   SOB in Triage. A&Ox4. GCS 15. BIB Wheelchair.

## 2022-06-19 NOTE — ED Provider Notes (Signed)
[facility] EMERGENCY DEPT Provider Note   CSN: [id number] Arrival date & time: 06/19/22  1557     History {Add pertinent medical, surgical, social history, OB history to HPI:1} Chief Complaint  Patient presents with   Shortness of Breath    Jeffrey Stewart is a 87 y.o. male.  Pt with hx interstitial lung disease (? Due to asbestos) c/o increased sob in the past few days. Use neb tx daily.  Denies chest pain or discomfort. Occasional non prod cough. No leg pain or swelling. No orthopnea. +wheezing. No specific known ill contacts. No fever or chills.   The history is provided by the patient, medical records and the spouse.  Shortness of Breath Associated symptoms: cough and wheezing   Associated symptoms: no abdominal pain, no chest pain, no fever, no headaches, no neck pain, no rash, no sore throat and no vomiting        Home Medications Prior to Admission medications   Medication Sig Start Date End Date Taking? Authorizing Provider  acetaminophen (TYLENOL) 500 MG tablet Take 1,000 mg by mouth every 6 (six) hours as needed for moderate pain.    [provider]  albuterol (PROVENTIL HFA;VENTOLIN HFA) 108 (90 BASE) MCG/ACT inhaler Inhale 1 puff into the lungs every 6 (six) hours as needed for wheezing.     [provider]  aspirin EC 81 MG tablet Take 81 mg by mouth every evening.    [provider]  atorvastatin (LIPITOR) 40 MG tablet TAKE 1 TABLET AT BEDTIME 02/07/22   Nahser, Wonda Cheng, MD  brimonidine (ALPHAGAN) 0.2 % ophthalmic solution 1 drop 2 (two) times daily. 1 drop 2 (two) times daily. 07/20/21   [provider]  carvedilol (COREG) 12.5 MG tablet TAKE 1 TABLET TWICE DAILY WITH MEALS 09/20/21   Nahser, Wonda Cheng, MD  cetirizine (ZYRTEC) 10 MG tablet Take 10 mg by mouth at bedtime.    [provider]  cholecalciferol (VITAMIN D) 1000 UNITS tablet Take 1,000 Units by mouth daily.    [provider]  fish  oil-omega-3 fatty acids 1000 MG capsule Take 1 g by mouth 2 (two) times daily.    [provider]  fluticasone (FLONASE) 50 MCG/ACT nasal spray Place 1-2 sprays into both nostrils daily.    [provider]  Fluticasone-Salmeterol (ADVAIR) 250-50 MCG/DOSE AEPB Inhale 1 puff into the lungs 2 (two) times daily. 05/12/19   [provider]  glucosamine-chondroitin 500-400 MG tablet Take 1 tablet by mouth daily.    [provider]  ipratropium-albuterol (DUONEB) 0.5-2.5 (3) MG/3ML SOLN SMARTSIG:3 Milliliter(s) Via Nebulizer Every 6 Hours PRN 07/11/21   [provider]  latanoprost (XALATAN) 0.005 % ophthalmic solution 1 drop at bedtime. 06/27/21   [provider]  lisinopril (ZESTRIL) 2.5 MG tablet TAKE 1 TABLET EVERY DAY 06/14/22   Nahser, Wonda Cheng, MD  metroNIDAZOLE (METROCREAM) 0.75 % cream Apply 1 application topically 2 (two) times daily.     [provider]  minocycline (DYNACIN) 50 MG tablet Take 50 mg by mouth 2 (two) times daily.    [provider]  NEOMYCIN-POLYMYXIN-HYDROCORTISONE (CORTISPORIN) 1 % SOLN OTIC solution Place 1 drop into both ears as needed. 05/13/19   [provider]  nitroGLYCERIN (NITROSTAT) 0.4 MG SL tablet Place 1 tablet (0.4 mg total) under the tongue every 5 (five) minutes as needed for chest pain. 02/14/22   Nahser, Wonda Cheng, MD  Polyethyl Glycol-Propyl Glycol 0.4-0.3 % SOLN Place 1 drop into both  eyes 2 (two) times daily as needed (dry eyes).    [provider]  potassium chloride SA (KLOR-CON M) 20 MEQ tablet TAKE 1 TABLET EVERY DAY 11/21/21   Nahser, Wonda Cheng, MD  predniSONE (DELTASONE) 5 MG tablet TAKE 5 MG TABLET BY MOUTH IN THE MORNING AND 1 MG TABLET BY MOUTH AT BEDTIME    [provider]  sodium chloride (OCEAN) 0.65 % nasal spray Place 1 spray into the nose as needed for congestion.    [provider]  torsemide (DEMADEX) 20 MG tablet Take 1 tablet (20 mg total)  by mouth daily. 12/16/21   Nahser, Wonda Cheng, MD      Allergies    Niaspan [niacin] and Meloxicam    Review of Systems   Review of Systems  Constitutional:  Negative for chills and fever.  HENT:  Negative for sore throat.   Eyes:  Negative for redness.  Respiratory:  Positive for cough, shortness of breath and wheezing.   Cardiovascular:  Negative for chest pain.  Gastrointestinal:  Negative for abdominal pain and vomiting.  Genitourinary:  Negative for flank pain.  Musculoskeletal:  Negative for back pain and neck pain.  Skin:  Negative for rash.  Neurological:  Negative for headaches.  Hematological:  Does not bruise/bleed easily.  Psychiatric/Behavioral:  Negative for confusion.     Physical Exam Updated Vital Signs BP (!) 147/74 (BP Location: Right Arm)   Pulse 99   Temp 97.6 F (36.4 C)   Resp (!) 24   Ht 1.829 m (6')   Wt 97.5 kg   SpO2 99%   BMI 29.15 kg/m  Physical Exam Vitals and nursing note reviewed.  Constitutional:      Appearance: Normal appearance. He is well-developed.  HENT:     Head: Atraumatic.     Nose: Nose normal.     Mouth/Throat:     Mouth: Mucous membranes are moist.     Pharynx: Oropharynx is clear.  Eyes:     General: No scleral icterus.    Conjunctiva/sclera: Conjunctivae normal.  Neck:     Trachea: No tracheal deviation.  Cardiovascular:     Rate and Rhythm: Normal rate and regular rhythm.     Pulses: Normal pulses.     Heart sounds: Normal heart sounds. No murmur heard.    No friction rub. No gallop.  Pulmonary:     Effort: Pulmonary effort is normal. No accessory muscle usage or respiratory distress.     Breath sounds: Wheezing present.  Abdominal:     General: There is no distension.     Palpations: Abdomen is soft.     Tenderness: There is no abdominal tenderness.  Genitourinary:    Comments: No cva tenderness. Musculoskeletal:        General: No swelling or tenderness.     Cervical back: Normal range of motion and neck  supple. No rigidity.     Right lower leg: No edema.     Left lower leg: No edema.  Skin:    General: Skin is warm and dry.     Findings: No rash.  Neurological:     Mental Status: He is alert.     Comments: Alert, speech clear.   Psychiatric:        Mood and Affect: Mood normal.     ED Results / Procedures / Treatments   Labs (all labs ordered are listed, but only abnormal results are displayed) Results for orders placed or performed during the  hospital encounter of 06/19/22  Resp Panel by RT-PCR (Flu A&B, Covid) Anterior Nasal Swab   Specimen: Anterior Nasal Swab  Result Value Ref Range   SARS Coronavirus 2 by RT PCR NEGATIVE NEGATIVE   Influenza A by PCR NEGATIVE NEGATIVE   Influenza B by PCR NEGATIVE NEGATIVE  Basic metabolic panel  Result Value Ref Range   Sodium 136 135 - 145 mmol/L   Potassium 3.8 3.5 - 5.1 mmol/L   Chloride 99 98 - 111 mmol/L   CO2 29 22 - 32 mmol/L   Glucose, Bld 151 (H) 70 - 99 mg/dL   BUN 22 8 - 23 mg/dL   Creatinine, Ser 1.04 0.61 - 1.24 mg/dL   Calcium 9.4 8.9 - 10.3 mg/dL   GFR, Estimated >60 >60 mL/min   Anion gap 8 5 - 15  CBC  Result Value Ref Range   WBC 9.0 4.0 - 10.5 K/uL   RBC 3.77 (L) 4.22 - 5.81 MIL/uL   Hemoglobin 14.0 13.0 - 17.0 g/dL   HCT 40.2 39.0 - 52.0 %   MCV 106.6 (H) 80.0 - 100.0 fL   MCH 37.1 (H) 26.0 - 34.0 pg   MCHC 34.8 30.0 - 36.0 g/dL   RDW 16.1 (H) 11.5 - 15.5 %   Platelets 157 150 - 400 K/uL   nRBC 0.0 0.0 - 0.2 %  Brain natriuretic peptide  Result Value Ref Range   B Natriuretic Peptide 61.7 0.0 - 100.0 pg/mL  Troponin I (High Sensitivity)  Result Value Ref Range   Troponin I (High Sensitivity) 5 <18 ng/L  Troponin I (High Sensitivity)  Result Value Ref Range   Troponin I (High Sensitivity) 5 <18 ng/L   DG Chest 2 View  Result Date: 06/19/2022 CLINICAL DATA:  Shortness of breath EXAM: CHEST - 2 VIEW COMPARISON:  Chest x-ray 07/21/2017.  Chest CT 05/30/2022. FINDINGS: The heart size and mediastinal  contours are within normal limits. There is some chronic appearing interstitial opacities in both lungs. There is chronic pleural thickening corresponding to CT findings. There is no pleural effusion or pneumothorax. There is no new focal lung infiltrate. The visualized skeletal structures are unremarkable. IMPRESSION: No active cardiopulmonary disease. Electronically Signed   By: Ronney Asters M.D.   On: 06/19/2022 16:29   CT CHEST HIGH RESOLUTION  Result Date: 06/01/2022 CLINICAL DATA:  87 year old male with history of interstitial lung disease. Follow-up study. EXAM: CT CHEST WITHOUT CONTRAST TECHNIQUE: Multidetector CT imaging of the chest was performed following the standard protocol without intravenous contrast. High resolution imaging of the lungs, as well as inspiratory and expiratory imaging, was performed. RADIATION DOSE REDUCTION: This exam was performed according to the departmental dose-optimization program which includes automated exposure control, adjustment of the mA and/or kV according to patient size and/or use of iterative reconstruction technique. COMPARISON:  High-resolution chest CT 07/28/2021. FINDINGS: Cardiovascular: Heart size is normal. There is no significant pericardial fluid, thickening or pericardial calcification. There is aortic atherosclerosis, as well as atherosclerosis of the great vessels of the mediastinum and the coronary arteries, including calcified atherosclerotic plaque in the left main, left anterior descending, left circumflex and right coronary arteries. Dilatation of the pulmonic trunk (3.7 cm in diameter). Aberrant right subclavian artery (normal anatomical variant) incidentally noted. Mediastinum/Nodes: No pathologically enlarged mediastinal or hilar lymph nodes. Please note that accurate exclusion of hilar adenopathy is limited on noncontrast CT scans. Esophagus is unremarkable in appearance. No axillary lymphadenopathy. Lungs/Pleura: High-resolution images  again demonstrate some patchy areas  of mild ground-glass attenuation, septal thickening, mild subpleural reticulation, mild cylindrical bronchiectasis and peripheral bronchiolectasis most evident in the mid to lower lungs bilaterally. Minimal progression compared to the prior examination. No frank honeycombing. Inspiratory and expiratory imaging is unremarkable. No acute consolidative airspace disease. No pleural effusions. No definite suspicious appearing pulmonary nodules or masses are noted. Calcified granuloma in the periphery of the left lower lobe. Upper Abdomen: Atherosclerotic calcifications in the abdominal aorta. Musculoskeletal: Healing nondisplaced fracture of the lateral right ninth rib. There are no aggressive appearing lytic or blastic lesions noted in the visualized portions of the skeleton. IMPRESSION: 1. The appearance of the lungs is indicative of interstitial lung disease with a spectrum of findings once again categorized as probable usual interstitial pneumonia (UIP) per current ATS guidelines. 2. Dilatation of the pulmonic trunk (3.7 cm in diameter), concerning for associated pulmonary arterial hypertension. 3. Aortic atherosclerosis, in addition to left main and three-vessel coronary artery disease. 4. Healing nondisplaced fracture of the lateral right ninth rib. Aortic Atherosclerosis (ICD10-I70.0). Electronically Signed   By: Vinnie Langton M.D.   On: 06/01/2022 07:15    EKG EKG Interpretation  Date/Time:  Monday June 19 2022 16:06:57 EST Ventricular Rate:  100 PR Interval:  162 QRS Duration: 142 QT Interval:  374 QTC Calculation: 482 R Axis:   -60 Text Interpretation: Normal sinus rhythm Left axis deviation Right bundle branch block Non-specific ST-t changes Confirmed by Lajean Saver [phone]) on 06/19/2022 4:18:41 PM  Radiology DG Chest 2 View  Result Date: 06/19/2022 CLINICAL DATA:  Shortness of breath EXAM: CHEST - 2 VIEW COMPARISON:  Chest x-ray 07/21/2017.  Chest  CT 05/30/2022. FINDINGS: The heart size and mediastinal contours are within normal limits. There is some chronic appearing interstitial opacities in both lungs. There is chronic pleural thickening corresponding to CT findings. There is no pleural effusion or pneumothorax. There is no new focal lung infiltrate. The visualized skeletal structures are unremarkable. IMPRESSION: No active cardiopulmonary disease. Electronically Signed   By: Ronney Asters M.D.   On: 06/19/2022 16:29    Procedures Procedures  {Document cardiac monitor, telemetry assessment procedure when appropriate:1}  Medications Ordered in ED Medications  albuterol (PROVENTIL) (2.5 MG/3ML) 0.083% nebulizer solution 5 mg (has no administration in time range)  ipratropium (ATROVENT) nebulizer solution 0.5 mg (has no administration in time range)  methylPREDNISolone sodium succinate (SOLU-MEDROL) 125 mg/2 mL injection 125 mg (has no administration in time range)    ED Course/ Medical Decision Making/ A&P                           Medical Decision Making Amount and/or Complexity of Data Reviewed Labs: ordered. Radiology: ordered.  Risk Prescription drug management.   Iv ns. Continuous pulse ox and cardiac monitoring. Labs ordered/sent. Imaging ordered.   Diff dx includes viral illness, copd exacerbation, cild, bronchitis/pna, etc - dispo decision including potential need for admission considered  - will get labs and imaging and reassess.   Reviewed nursing notes and prior charts for additional history. External reports reviewed. Additional history from: spouse.   Cardiac monitor: sinus rhythm, rate 94.  Albuterol and atrovent tx. Solumedrol iv.   Labs reviewed/interpreted by me - wbc and chem normal.   Xrays reviewed/interpreted by me - no pna.   Recheck wheezing improved but not resolved.  Additional albuterol tx.   Recheck, breathing comfortably, moving air well. Wheezing improved.    Discussed dispo, offered  admit, etc.  -  pt indicates he feels he is breathing well enough for d/c, he/spouse request d/c to home.    Pt currently does appear stable for d/c. No pain/discomfort. Breathing comfortably, sats 96%.   Rec close pcp/pulm f/u.  Return precautions provided.    {Document critical care time when appropriate:1} {Document review of labs and clinical decision tools ie heart score, Chads2Vasc2 etc:1}  {Document your independent review of radiology images, and any outside records:1} {Document your discussion with family members, caretakers, and with consultants:1} {Document social determinants of health affecting pt's care:1} {Document your decision making why or why not admission, treatments were needed:1} Final Clinical Impression(s) / ED Diagnoses Final diagnoses:  None    Rx / DC Orders ED Discharge Orders     None

## 2022-06-20 ENCOUNTER — Telehealth: Payer: Self-pay | Admitting: Pulmonary Disease

## 2022-06-20 NOTE — Telephone Encounter (Signed)
Spoke with Parke Simmers (pt's wife per [facility]) who states pt was seen in ER last night r/t feeling like he couldn't breathe. Pt was seen and D/C'd with albuterol and a prednisone taper. Parke Simmers states pt feel much better this morning but they were told to contact Dr. Vaughan Browner and update him on visit to ER. Dr. Vaughan Browner please review visit and advise if needed.

## 2022-06-20 NOTE — Telephone Encounter (Signed)
Called and spoke with edna. She verbalized understanding.   Nothing further needed.

## 2022-06-20 NOTE — Telephone Encounter (Signed)
I reviewed the ED notes and treatment seems appropriate. He already has a follow up appointment with me next month

## 2022-06-20 NOTE — Telephone Encounter (Signed)
ATC patient. LVMTCB. 

## 2022-07-11 ENCOUNTER — Other Ambulatory Visit: Payer: Self-pay | Admitting: Cardiovascular Disease

## 2022-07-28 ENCOUNTER — Ambulatory Visit (INDEPENDENT_AMBULATORY_CARE_PROVIDER_SITE_OTHER): Payer: Medicare HMO | Admitting: Pulmonary Disease

## 2022-07-28 ENCOUNTER — Ambulatory Visit: Payer: Medicare HMO | Admitting: Pulmonary Disease

## 2022-07-28 ENCOUNTER — Encounter: Payer: Self-pay | Admitting: Pulmonary Disease

## 2022-07-28 VITALS — BP 118/58 | HR 85 | Temp 98.2°F | Ht 71.0 in | Wt 219.4 lb

## 2022-07-28 DIAGNOSIS — Z5181 Encounter for therapeutic drug level monitoring: Secondary | ICD-10-CM

## 2022-07-28 DIAGNOSIS — J849 Interstitial pulmonary disease, unspecified: Secondary | ICD-10-CM | POA: Diagnosis not present

## 2022-07-28 LAB — PULMONARY FUNCTION TEST
DL/VA % pred: 77 %
DL/VA: 2.96 ml/min/mmHg/L
DLCO cor % pred: 49 %
DLCO cor: 12.02 ml/min/mmHg
DLCO unc % pred: 48 %
DLCO unc: 11.82 ml/min/mmHg
FEF 25-75 Post: 1.56 L/sec
FEF 25-75 Pre: 1.03 L/sec
FEF2575-%Change-Post: 52 %
FEF2575-%Pred-Post: 86 %
FEF2575-%Pred-Pre: 56 %
FEV1-%Change-Post: 10 %
FEV1-%Pred-Post: 62 %
FEV1-%Pred-Pre: 57 %
FEV1-Post: 1.76 L
FEV1-Pre: 1.59 L
FEV1FVC-%Change-Post: -1 %
FEV1FVC-%Pred-Pre: 102 %
FEV6-%Change-Post: 14 %
FEV6-%Pred-Post: 66 %
FEV6-%Pred-Pre: 58 %
FEV6-Post: 2.46 L
FEV6-Pre: 2.16 L
FEV6FVC-%Change-Post: 1 %
FEV6FVC-%Pred-Post: 107 %
FEV6FVC-%Pred-Pre: 105 %
FVC-%Change-Post: 12 %
FVC-%Pred-Post: 62 %
FVC-%Pred-Pre: 55 %
FVC-Post: 2.47 L
FVC-Pre: 2.21 L
Post FEV1/FVC ratio: 71 %
Post FEV6/FVC ratio: 99 %
Pre FEV1/FVC ratio: 72 %
Pre FEV6/FVC Ratio: 98 %
RV % pred: 72 %
RV: 2.03 L
TLC % pred: 66 %
TLC: 4.81 L

## 2022-07-28 MED ORDER — MYCOPHENOLATE MOFETIL 500 MG PO TABS: 500.0000 mg | ORAL_TABLET | Freq: Two times a day (BID) | ORAL | 5 refills | Status: DC

## 2022-07-28 NOTE — Patient Instructions (Signed)
Will start you on a medication called CellCept 500 mg twice daily Follow-up in clinic in 1 to 2 months.

## 2022-07-28 NOTE — Progress Notes (Signed)
JUN OSMENT    [id number]    Dec 07, 1934  Primary Care Physician:Williams, Gracelyn Nurse, PA-C  Referring Physician: Heywood Bene, PA-C [id number] [place],  [place] [address]  Chief complaint: Follow-up for interstitial lung disease, abnormal CT scan  HPI: 88 y.o.  with history of CHF, coronary artery disease.   Complains of worsening dyspnea on exertion for the past year.  Has occasional cough with mucus.  Denies any wheezing, fevers, chills He was treated for a community-acquired pneumonia in July 2022.  A follow-up CT showed interstitial changes with basilar fibrosis and mild groundglass opacity in the right upper lobe.  He has been referred here for further evaluation.  Developed COVID-19 and November 2022 treated with molnupiravir.  He also got a Z-Pak and prednisone around that time Follows with Dr. Acie Fredrickson for heart failure.  Received note from Dr. Amil Amen dated 01/31/2022 Patient is being followed for polymyalgia rheumatica and has features of seronegative rheumatoid arthritis.  He had failed a trial of methotrexate previously Joint symptoms are stable at present Unable to taper prednisone below 6 mg.   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  Later worked in the [employer] of a [employer] ILD exposure questionnaire 03/15/2021.  No mold, hot tub, Jacuzzi.  Does not recall exposure to asbestos.  He has feather pillows and comforters that he is used for many years Smoking history: Never smoker Travel history: No significant travel history Relevant family history: No family history of lung disease  Interval history: He is following with Dr. Amil Amen, rheumatology for polymyalgia rheumatica and seronegative rheumatoid arthritis.  He is on chronic prednisone at 6 mg from rheumatology  Seen in the ED in December 2023 for acute on chronic respiratory failure.  Treated with bronchodilators, IV steroids with  improvement in symptoms  Outpatient Encounter Medications as of 07/28/2022  Medication Sig   acetaminophen (TYLENOL) 500 MG tablet Take 1,000 mg by mouth every 6 (six) hours as needed for moderate pain.   albuterol (VENTOLIN HFA) 108 (90 Base) MCG/ACT inhaler Inhale 2 puffs into the lungs every 4 (four) hours as needed for wheezing or shortness of breath.   aspirin EC 81 MG tablet Take 81 mg by mouth every evening.   atorvastatin (LIPITOR) 40 MG tablet TAKE 1 TABLET AT BEDTIME   brimonidine (ALPHAGAN) 0.2 % ophthalmic solution 1 drop 2 (two) times daily. 1 drop 2 (two) times daily.   carvedilol (COREG) 12.5 MG tablet TAKE 1 TABLET TWICE DAILY WITH MEALS   cetirizine (ZYRTEC) 10 MG tablet Take 10 mg by mouth at bedtime.   cholecalciferol (VITAMIN D) 1000 UNITS tablet Take 1,000 Units by mouth daily.   fish oil-omega-3 fatty acids 1000 MG capsule Take 1 g by mouth 2 (two) times daily.   fluticasone (FLONASE) 50 MCG/ACT nasal spray Place 1-2 sprays into both nostrils daily.   Fluticasone-Salmeterol (ADVAIR) 250-50 MCG/DOSE AEPB Inhale 1 puff into the lungs 2 (two) times daily.   glucosamine-chondroitin 500-400 MG tablet Take 1 tablet by mouth daily.   ipratropium-albuterol (DUONEB) 0.5-2.5 (3) MG/3ML SOLN SMARTSIG:3 Milliliter(s) Via Nebulizer Every 6 Hours PRN   latanoprost (XALATAN) 0.005 % ophthalmic solution 1 drop at bedtime.   lisinopril (ZESTRIL) 2.5 MG tablet TAKE 1 TABLET EVERY DAY   metroNIDAZOLE (METROCREAM) 0.75 % cream Apply 1 application topically 2 (two) times daily.    minocycline (DYNACIN) 50 MG tablet Take  50 mg by mouth 2 (two) times daily.   NEOMYCIN-POLYMYXIN-HYDROCORTISONE (CORTISPORIN) 1 % SOLN OTIC solution Place 1 drop into both ears as needed.   nitroGLYCERIN (NITROSTAT) 0.4 MG SL tablet Place 1 tablet (0.4 mg total) under the tongue every 5 (five) minutes as needed for chest pain.   Polyethyl Glycol-Propyl Glycol 0.4-0.3 % SOLN Place 1 drop into both eyes 2 (two) times  daily as needed (dry eyes).   potassium chloride SA (KLOR-CON M) 20 MEQ tablet Take 1 tablet (20 mEq total) by mouth daily.   predniSONE (DELTASONE) 5 MG tablet TAKE 5 MG TABLET BY MOUTH IN THE MORNING AND 1 MG TABLET BY MOUTH AT BEDTIME   sodium chloride (OCEAN) 0.65 % nasal spray Place 1 spray into the nose as needed for congestion.   torsemide (DEMADEX) 20 MG tablet Take 1 tablet (20 mg total) by mouth daily.   [DISCONTINUED] albuterol (PROVENTIL HFA;VENTOLIN HFA) 108 (90 BASE) MCG/ACT inhaler Inhale 1 puff into the lungs every 6 (six) hours as needed for wheezing.    [DISCONTINUED] predniSONE (DELTASONE) 20 MG tablet 3 po once a day for 2 days, then 2 po once a day for 3 days, then 1 po once a day for 3 days   No facility-administered encounter medications on file as of 07/28/2022.    Physical Exam: Blood pressure 122/62, pulse 71, temperature 97.9 F (36.6 C), temperature source Oral, height 6' (1.829 m), weight 218 lb (98.9 kg), SpO2 98 %. Gen:      No acute distress HEENT:  EOMI, sclera anicteric Neck:     No masses; no thyromegaly Lungs:    Clear to auscultation bilaterally; normal respiratory effort CV:         Regular rate and rhythm; no murmurs Abd:      + bowel sounds; soft, non-tender; no palpable masses, no distension Ext:    No edema; adequate peripheral perfusion Skin:      Warm and dry; no rash Neuro: alert and oriented x 3 Psych: normal mood and affect   Data Reviewed: Imaging: CT abdomen pelvis 08/28/2013-mild reticular changes at the base CT chest 01/31/2021-basal fibrotic changes with mild traction bronchiectasis, scattered groundglass pulmonary infiltrates in the posterior right upper lobe and the left lower lobe. 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  Probable bronchopneumonia in the right upper lobe with areas of consolidation and pre-existing region interstitial lung disease High-resolution CT 07/28/2021-improvement in  inflammatory changes of pneumonia.  Stable probable UIP pattern pulmonary fibrosis CT high-resolution 03/30/2022-minimal worsening of pulmonary fibrosis and probable UIP pattern Chest x-ray 06/19/2022-no active cardiopulmonary disease I have reviewed the images personally.  PFTs: 05/06/2021 FVC 2.39 (59%), FEV1 1.70 60%, F/F 71, TLC 4.67 64%, DLCO 12.91 52% Severe restriction, diffusion defect  07/28/2022 FVC 2.47 [62%], FEV1 1.76 [62%], F/F 71, TLC 4.81 [56%], DLCO 11.82 [48%] Severe restriction, diffusion defect  Labs: CTD serologies 03/15/2021-  SCL 100- 25  Cardiac: Echocardiogram 09/18/2013 LV cavity is dilated, EF 50-55%, moderate dilatation of RV cavity with mild reduction in systolic function.  Echocardiogram 08/25/2021 LVEF 16%, grade 1 diastolic dysfunction, normal RV systolic size and function.  Assessment:  Evaluation for interstitial lung disease. I have reviewed his CT scan which shows a basilar pattern of pulmonary fibrosis.  This appears to be in probable UIP pattern and has remained stable on follow-up imaging.  PFTs reviewed with restriction and diffusion defect  Differential diagnosis at this point is connective tissue disease, chronic hypersensitivity pneumonitis versus IPF  He has gotten rid of his down comforter and pillows since September 2022 CTD serologies are negative borderline elevation in SCL 100 which is likely nonspecific as he does not have any symptoms.  However he is following with Dr. Amil Amen, rheumatology for inflammatory arthritis and is on chronic prednisone at 6 mg.  He has worsening dyspnea with reduction in diffusion capacity.  Although CT scan is stable last year I believe he may have worsening ILD I will start him on CellCept and monitor closely.  He may need initiation of antifibrotic therapy as well Labs last month are within normal limits  Plan/Recommendations: CellCept 500 mg twice daily  Marshell Garfinkel MD [facility] 07/28/2022, 1:29 PM  CC: Heywood Bene, *  Addendum:

## 2022-07-28 NOTE — Progress Notes (Signed)
Full PFT performed today.

## 2022-07-28 NOTE — Patient Instructions (Signed)
Full PFT performed today.

## 2022-09-11 ENCOUNTER — Other Ambulatory Visit: Payer: Self-pay | Admitting: Cardiovascular Disease

## 2022-09-11 NOTE — Telephone Encounter (Signed)
Rx refill sent to pharmacy. 

## 2022-10-12 ENCOUNTER — Other Ambulatory Visit: Payer: Self-pay | Admitting: Cardiovascular Disease

## 2022-11-13 ENCOUNTER — Other Ambulatory Visit: Payer: Self-pay | Admitting: Cardiovascular Disease

## 2023-01-08 ENCOUNTER — Other Ambulatory Visit: Payer: Self-pay | Admitting: Pulmonary Disease

## 2023-01-25 ENCOUNTER — Other Ambulatory Visit: Payer: Self-pay | Admitting: Cardiovascular Disease

## 2023-02-12 NOTE — Progress Notes (Signed)
Cardiology Office Note:  .   Date:  02/20/2023  ID:  Rhona Raider, DOB 06-26-1935, MRN [id number] PCP: Bernita Buffy  [facility] Providers Cardiologist:  Kristeen Miss, MD    Patient Profile: .      PMH: HFrEF with return of normal LVEF Nonischemic cardiomyopathy EF 35-40 by cath 2009 Echo 3/15 EF 50-55 CAD Cath 2009: mLAD 40-50, D1 50, LPDA 80-90 >> med Rx RBBB Hyperlipidemia Asthma/COPD Polymalgia rheumatica and seronegative RA Chronic prednisone therapy Interstitial lung disease/pulmonary fibrosis Pulmonary hypertension with RV failure Echo 08/25/21 LVEF 50%, global HK, severe LVH, G1DD, moderately reduced RV function  He has maintained consistent follow-up. Last cardiology clinic visit was 02/14/2022 with Dr. Elease Hashimoto at which time he had no specific cardiac complaints.  1 year follow-up was recommended.       History of Present Illness: .   PUJAN FREELY is a very pleasant 88 y.o. male who is here today for follow-up. Reports is having weakness and "not feeling right" at times associated with low blood pressure. No presyncope or syncope. Monitors home BP and if it is < 100 systolic, he holds lisinopril, sometimes for up to a few days. Has been losing weight over the past year. He thinks his appetite is good but his wife says he is eating less. Has chronic bilateral LE edema. Uses lounge doctor which he thinks helps a great deal. He denies chest pain, orthopnea, PND, dyspnea. He thinks his breathing is better since starting CellCept, is awaiting follow-up with pulmonology.  No bleeding concerns.  ROS: See HPI       Studies Reviewed: .         Risk Assessment/Calculations:             Physical Exam:   VS:  BP (!) 100/50   Pulse 71   Ht 5\' 11"  (1.803 m)   Wt 206 lb (93.4 kg)   SpO2 97%   BMI 28.73 kg/m    Wt Readings from Last 3 Encounters:  02/20/23 206 lb (93.4 kg)  07/28/22 219 lb 6.4 oz (99.5 kg)  06/19/22 214 lb 15.2 oz (97.5 kg)    GEN:  Well nourished, well developed in no acute distress NECK: No JVD; No carotid bruits CARDIAC: RRR, no murmurs, rubs, gallops RESPIRATORY:  Clear to auscultation without rales, wheezing or rhonchi  ABDOMEN: Soft, non-tender, non-distended EXTREMITIES:  No edema; No deformity     ASSESSMENT AND PLAN: .    Orthostatic hypotension/Hx hypertension: He reports weakness and fatigue associated with low BP readings. BP as low as 80s systolic at times. No presyncope or syncope. He has been holding lisinopril on a regular basis. He would likely benefit from effects of both lisinopril and carvedilol due to hx of HFrEF. We will reduce lisinopril to 2.5 mg daily and reduce carvedilol to 6.25 mg twice daily to see if hypotension improves. Advised him to notify us if he continues to have consistent SBP < 100 mmHg. Lab results from 02/12/23 reviewed, stable renal function and electrolytes.  CAD without angina: History of cath 2009 which revealed mLAD 40-50, D1 50, LPDA 80-90, recommendation for medical therapy. History of ILD. He has chronic shortness of breath that he feels is stable. He denies chest pain, dyspnea, or other symptoms concerning for angina.  No indication for further ischemic evaluation at this time. No bleeding concerns.  Continue aspirin, atorvastatin, carvedilol, lisinopril.   Chronic HFrEF/RV failure: Echo 2//92023 with LVEF 50%, G1 DD,  moderately reduced RVSF.  He reports improvement in shortness of breath since starting CellCept.  He is awaiting follow-up with pulmonology. Bilateral LE edema is chronic. He otherwise appears euvolemic on exam. He denies dyspnea, orthopnea, PND. Continues to sleep in a chair because he feels that he breaths better in that position. Unintentional weight loss. I am reducing carvedilol dose in hopes of resuming daily lisinopril.   Anorexia: Weight is down 13 lb from January 2024. He reports good appetite, but his wife thinks his appetite is decreased. We discussed the  importance of protein intake. He is routinely drinking Ensure.   Hyperlipidemia LDL goal < 70: LDL 56 on 08/09/21. We will recheck lipid today. Continue atorvastatin.   ILD: Was started on CellCept by Dr. Isaiah Serge, pulmonologist, in January for worsening dyspnea and reduction in diffusion capacity. CT was felt to be stable. Management per pulmonology.        Dispo: 4 months with Dr. Elease Hashimoto or me  Signed, Eligha Bridegroom, NP-C

## 2023-02-20 ENCOUNTER — Encounter: Payer: Self-pay | Admitting: Nurse Practitioner

## 2023-02-20 ENCOUNTER — Ambulatory Visit: Payer: Medicare HMO | Attending: Nurse Practitioner | Admitting: Nurse Practitioner

## 2023-02-20 VITALS — BP 100/50 | HR 71 | Ht 71.0 in | Wt 206.0 lb

## 2023-02-20 DIAGNOSIS — I5022 Chronic systolic (congestive) heart failure: Secondary | ICD-10-CM

## 2023-02-20 DIAGNOSIS — I2510 Atherosclerotic heart disease of native coronary artery without angina pectoris: Secondary | ICD-10-CM | POA: Diagnosis not present

## 2023-02-20 DIAGNOSIS — E785 Hyperlipidemia, unspecified: Secondary | ICD-10-CM | POA: Diagnosis not present

## 2023-02-20 DIAGNOSIS — R630 Anorexia: Secondary | ICD-10-CM | POA: Diagnosis not present

## 2023-02-20 DIAGNOSIS — I10 Essential (primary) hypertension: Secondary | ICD-10-CM

## 2023-02-20 DIAGNOSIS — I50810 Right heart failure, unspecified: Secondary | ICD-10-CM

## 2023-02-20 DIAGNOSIS — I951 Orthostatic hypotension: Secondary | ICD-10-CM

## 2023-02-20 DIAGNOSIS — J849 Interstitial pulmonary disease, unspecified: Secondary | ICD-10-CM

## 2023-02-20 LAB — LIPID PANEL
Chol/HDL Ratio: 3 ratio (ref 0.0–5.0)
Cholesterol, Total: 109 mg/dL (ref 100–199)
HDL: 36 mg/dL — ABNORMAL LOW (ref >39)
LDL Chol Calc (NIH): 54 mg/dL (ref 0–99)
Triglycerides: 97 mg/dL (ref 0–149)
VLDL Cholesterol Cal: 19 mg/dL (ref 5–40)

## 2023-02-20 MED ORDER — CARVEDILOL 6.25 MG PO TABS: 6.2500 mg | ORAL_TABLET | Freq: Two times a day (BID) | ORAL | 3 refills | Status: DC

## 2023-02-20 MED ORDER — CARVEDILOL 6.25 MG PO TABS: 6.2500 mg | ORAL_TABLET | Freq: Two times a day (BID) | ORAL | 0 refills | Status: DC

## 2023-02-20 NOTE — Patient Instructions (Signed)
Medication Instructions:   DECREASE Coreg one (1) tablet by mouth ( 6.25 mg) twice daily.  Check BP if in 2-3 days your Systolic ( top number) is over 100 please resume Lisinopril.   *If you need a refill on your cardiac medications before your next appointment, please call your pharmacy*   Lab Work:  TODAY!!!! LIPID  If you have labs (blood work) drawn today and your tests are completely normal, you will receive your results only by: MyChart Message (if you have MyChart) OR A paper copy in the mail If you have any lab test that is abnormal or we need to change your treatment, we will call you to review the results.   Testing/Procedures:  None ordered.   Follow-Up: At [facility], you and your health needs are our priority.  As part of our continuing mission to provide you with exceptional heart care, we have created designated Provider Care Teams.  These Care Teams include your primary Cardiologist (physician) and Advanced Practice Providers (APPs -  Physician Assistants and Nurse Practitioners) who all work together to provide you with the care you need, when you need it.  We recommend signing up for the patient portal called "MyChart".  Sign up information is provided on this After Visit Summary.  MyChart is used to connect with patients for Virtual Visits (Telemedicine).  Patients are able to view lab/test results, encounter notes, upcoming appointments, etc.  Non-urgent messages can be sent to your provider as well.   To learn more about what you can do with MyChart, go to [email or web address].    Your next appointment:   4 month(s)  Provider:   Kristeen Miss, MD

## 2023-02-28 ENCOUNTER — Telehealth: Payer: Self-pay | Admitting: Nurse Practitioner

## 2023-02-28 NOTE — Telephone Encounter (Signed)
Patient aware to hold lisinopril.

## 2023-02-28 NOTE — Telephone Encounter (Signed)
Pt spouse called in stating they were told at last appt to take lisinopril if systolic is over 409. She states today its 87/56. She wants to know if pt should still take med or not.

## 2023-02-28 NOTE — Telephone Encounter (Signed)
Spoke with patient's wife Jeffrey Stewart, she reports BP this morning around 11am was 87/56. Held Lisinopril. Patient reports feeling weak at that time, he laid down and took a nap.  At time of call patient is sitting up, had a nutrition supplement drink about 30 minutes ago, and is feeling "some better" since his nap.  Blanchard Kelch to continue to hold Lisinopril, only giving if SBP is >100.  Jeffrey Stewart reports last BP checked was on 08/11: 110/56 before medications, then 98/55 after taking Lisinopril that day.  Encouraged patient to eat a meal if able and to drink water, being careful to not overdo  it d/t hx of CHF.  Advised patient/[name] to call back if BP does not improve by this evening or if BP drops/symptoms worsen. Patient/[name] verbalized understanding.  Will forward to Eligha Bridegroom, NP to review and advise.

## 2023-02-28 NOTE — Telephone Encounter (Signed)
Please advise patient to hold lisinopril for now.

## 2023-03-06 ENCOUNTER — Other Ambulatory Visit: Payer: Self-pay | Admitting: Nurse Practitioner

## 2023-03-27 ENCOUNTER — Ambulatory Visit: Payer: Medicare HMO | Admitting: Pulmonary Disease

## 2023-03-27 ENCOUNTER — Encounter: Payer: Self-pay | Admitting: Pulmonary Disease

## 2023-03-27 VITALS — BP 118/60 | HR 66 | Temp 97.4°F | Ht 72.0 in | Wt 206.0 lb

## 2023-03-27 DIAGNOSIS — Z5181 Encounter for therapeutic drug level monitoring: Secondary | ICD-10-CM

## 2023-03-27 DIAGNOSIS — J849 Interstitial pulmonary disease, unspecified: Secondary | ICD-10-CM

## 2023-03-27 LAB — CBC WITH DIFFERENTIAL/PLATELET
Basophils Absolute: 0 10*3/uL (ref 0.0–0.1)
Basophils Relative: 0.2 % (ref 0.0–3.0)
Eosinophils Absolute: 0.2 10*3/uL (ref 0.0–0.7)
Eosinophils Relative: 2.4 % (ref 0.0–5.0)
HCT: 38.9 % — ABNORMAL LOW (ref 39.0–52.0)
Hemoglobin: 13 g/dL (ref 13.0–17.0)
Lymphocytes Relative: 16.6 % (ref 12.0–46.0)
Lymphs Abs: 1.5 10*3/uL (ref 0.7–4.0)
MCHC: 33.4 g/dL (ref 30.0–36.0)
MCV: 110.7 fl — ABNORMAL HIGH (ref 78.0–100.0)
Monocytes Absolute: 0.7 10*3/uL (ref 0.1–1.0)
Monocytes Relative: 7.1 % (ref 3.0–12.0)
Neutro Abs: 6.8 10*3/uL (ref 1.4–7.7)
Neutrophils Relative %: 73.7 % (ref 43.0–77.0)
Platelets: 177 10*3/uL (ref 150.0–400.0)
RBC: 3.51 Mil/uL — ABNORMAL LOW (ref 4.22–5.81)
RDW: 19.1 % — ABNORMAL HIGH (ref 11.5–15.5)
WBC: 9.3 10*3/uL (ref 4.0–10.5)

## 2023-03-27 LAB — COMPREHENSIVE METABOLIC PANEL
ALT: 11 U/L (ref 0–53)
AST: 15 U/L (ref 0–37)
Albumin: 3.8 g/dL (ref 3.5–5.2)
Alkaline Phosphatase: 59 U/L (ref 39–117)
BUN: 22 mg/dL (ref 6–23)
CO2: 32 meq/L (ref 19–32)
Calcium: 9.3 mg/dL (ref 8.4–10.5)
Chloride: 100 meq/L (ref 96–112)
Creatinine, Ser: 1.07 mg/dL (ref 0.40–1.50)
GFR: 63.06 mL/min (ref >60.00)
Glucose, Bld: 117 mg/dL — ABNORMAL HIGH (ref 70–99)
Potassium: 4.3 meq/L (ref 3.5–5.1)
Sodium: 139 meq/L (ref 135–145)
Total Bilirubin: 1.2 mg/dL (ref 0.2–1.2)
Total Protein: 6.3 g/dL (ref 6.0–8.3)

## 2023-03-27 MED ORDER — MYCOPHENOLATE MOFETIL 500 MG PO TABS: 1000.0000 mg | ORAL_TABLET | Freq: Two times a day (BID) | ORAL | 3 refills | Status: DC

## 2023-03-27 NOTE — Progress Notes (Signed)
Jeffrey Stewart    [id number]    10-Aug-1934  Primary Care Physician:Williams, Karis Juba, PA-C  Referring Physician: Bernita Buffy [address],  [place] [address]  Chief complaint: Follow-up for interstitial lung disease, abnormal CT scan  HPI: 88 y.o.  with history of CHF, coronary artery disease.   Complains of worsening dyspnea on exertion for the past year.  Has occasional cough with mucus.  Denies any wheezing, fevers, chills He was treated for a community-acquired pneumonia in July 2022.  A follow-up CT showed interstitial changes with basilar fibrosis and mild groundglass opacity in the right upper lobe.  He has been referred here for further evaluation.  Developed COVID-19 and November 2022 treated with molnupiravir.  He also got a Z-Pak and prednisone around that time Follows with Dr. Elease Hashimoto for heart failure.  Received note from Dr. Dierdre Forth dated 01/31/2022 Patient is being followed for polymyalgia rheumatica and has features of seronegative rheumatoid arthritis.  He had failed a trial of methotrexate previously Joint symptoms are stable at present Unable to taper prednisone below 6 mg.   Pets: No pets Occupation: Used to work in [employer] when he was young.  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  No mold, hot tub, Jacuzzi.  Does not recall exposure to asbestos.  He has feather pillows and comforters that he is used for many years Smoking history: Never smoker Travel history: No significant travel history Relevant family history: No family history of lung disease  Interval history: He is following with Dr. Dierdre Forth, rheumatology for polymyalgia rheumatica and seronegative rheumatoid arthritis.  He is on chronic prednisone at 6 mg from rheumatology  Started on CellCept at 500 mg/day in January.  He missed his follow-up But he is doing well and states that his  breathing is improved.  Outpatient Encounter Medications as of 03/27/2023  Medication Sig   acetaminophen (TYLENOL) 500 MG tablet Take 1,000 mg by mouth every 6 (six) hours as needed for moderate pain.   albuterol (VENTOLIN HFA) 108 (90 Base) MCG/ACT inhaler Inhale 2 puffs into the lungs every 4 (four) hours as needed for wheezing or shortness of breath.   aspirin EC 81 MG tablet Take 81 mg by mouth every evening.   atorvastatin (LIPITOR) 40 MG tablet Take 1 tablet (40 mg total) by mouth at bedtime.   brimonidine (ALPHAGAN) 0.2 % ophthalmic solution 1 drop 2 (two) times daily. 1 drop 2 (two) times daily.   carvedilol (COREG) 6.25 MG tablet TAKE 1 TABLET BY MOUTH TWICE A DAY   cetirizine (ZYRTEC) 10 MG tablet Take 10 mg by mouth at bedtime.   cholecalciferol (VITAMIN D) 1000 UNITS tablet Take 1,000 Units by mouth daily.   fish oil-omega-3 fatty acids 1000 MG capsule Take 1 g by mouth 2 (two) times daily.   fluticasone (FLONASE) 50 MCG/ACT nasal spray Place 1-2 sprays into both nostrils daily.   Fluticasone-Salmeterol (ADVAIR) 250-50 MCG/DOSE AEPB Inhale 1 puff into the lungs 2 (two) times daily.   glucosamine-chondroitin 500-400 MG tablet Take 1 tablet by mouth daily.   ipratropium-albuterol (DUONEB) 0.5-2.5 (3) MG/3ML SOLN SMARTSIG:3 Milliliter(s) Via Nebulizer Every 6 Hours PRN   latanoprost (XALATAN) 0.005 % ophthalmic solution 1 drop at bedtime.   lisinopril (ZESTRIL) 2.5 MG tablet TAKE 1 TABLET EVERY DAY   metroNIDAZOLE (METROCREAM) 0.75 % cream Apply 1 application topically 2 (two) times daily.  minocycline (DYNACIN) 50 MG tablet Take 50 mg by mouth 2 (two) times daily.   mycophenolate (CELLCEPT) 500 MG tablet TAKE 1 TABLET BY MOUTH TWICE A DAY   NEOMYCIN-POLYMYXIN-HYDROCORTISONE (CORTISPORIN) 1 % SOLN OTIC solution Place 1 drop into both ears as needed.   nitroGLYCERIN (NITROSTAT) 0.4 MG SL tablet Place 1 tablet (0.4 mg total) under the tongue every 5 (five) minutes as needed for chest  pain.   Polyethyl Glycol-Propyl Glycol 0.4-0.3 % SOLN Place 1 drop into both eyes 2 (two) times daily as needed (dry eyes).   potassium chloride SA (KLOR-CON M) 20 MEQ tablet Take 1 tablet (20 mEq total) by mouth daily.   predniSONE (DELTASONE) 5 MG tablet TAKE 5 MG TABLET BY MOUTH IN THE MORNING AND 1 MG TABLET BY MOUTH AT BEDTIME   sodium chloride (OCEAN) 0.65 % nasal spray Place 1 spray into the nose as needed for congestion.   torsemide (DEMADEX) 20 MG tablet TAKE 1 TABLET BY MOUTH EVERY DAY   No facility-administered encounter medications on file as of 03/27/2023.    Physical Exam: Blood pressure 118/60, pulse 66, temperature (!) 97.4 F (36.3 C), temperature source Temporal, height 6' (1.829 m), weight 206 lb (93.4 kg), SpO2 100%. Gen:      No acute distress HEENT:  EOMI, sclera anicteric Neck:     No masses; no thyromegaly Lungs:    Clear to auscultation bilaterally; normal respiratory effort CV:         Regular rate and rhythm; no murmurs Abd:      + bowel sounds; soft, non-tender; no palpable masses, no distension Ext:    No edema; adequate peripheral perfusion Skin:      Warm and dry; no rash Neuro: alert and oriented x 3 Psych: normal mood and affect   Data Reviewed: Imaging: CT abdomen pelvis 08/28/2013-mild reticular changes at the base CT chest 01/31/2021-basal fibrotic changes with mild traction bronchiectasis, scattered groundglass pulmonary infiltrates in the posterior right upper lobe and the left lower lobe. 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  Probable bronchopneumonia in the right upper lobe with areas of consolidation and pre-existing region interstitial lung disease High-resolution CT 07/28/2021-improvement in inflammatory changes of pneumonia.  Stable probable UIP pattern pulmonary fibrosis CT high-resolution 03/30/2022-minimal worsening of pulmonary fibrosis and probable UIP pattern Chest x-ray 06/19/2022-no active  cardiopulmonary disease I have reviewed the images personally.  PFTs: 05/06/2021 FVC 2.39 (59%), FEV1 1.70 60%, F/F 71, TLC 4.67 64%, DLCO 12.91 52% Severe restriction, diffusion defect  07/28/2022 FVC 2.47 [62%], FEV1 1.76 [62%], F/F 71, TLC 4.81 [56%], DLCO 11.82 [48%] Severe restriction, diffusion defect  Labs: CTD serologies 03/15/2021-  SCL 100- 25  Cardiac: Echocardiogram 09/18/2013 LV cavity is dilated, EF 50-55%, moderate dilatation of RV cavity with mild reduction in systolic function.  Echocardiogram 08/25/2021 LVEF 50%, grade 1 diastolic dysfunction, normal RV systolic size and function.  Assessment:  Evaluation for interstitial lung disease. I have reviewed his CT scan which shows a basilar pattern of pulmonary fibrosis.  This appears to be in probable UIP pattern and has remained stable on follow-up imaging.  PFTs reviewed with restriction and diffusion defect  Differential diagnosis at this point is connective tissue disease, chronic hypersensitivity pneumonitis versus IPF He has gotten rid of his down comforter and pillows since September 2022 CTD serologies are negative borderline elevation in SCL 100 which is likely nonspecific as he does not have any symptoms.  However he is following with Dr. Dierdre Forth, rheumatology  for inflammatory arthritis and is on chronic prednisone at 6 mg.  He has worsening dyspnea with reduction in diffusion capacity.  Although CT scan is stable last year I believe he may have worsening ILD We have started him on CellCept in January 2024.  Will check labs for monitoring and increase dose to 1 g twice daily. Check high-res CT scan, PFTs He may need initiation of antifibrotic therapy as well  Plan/Recommendations: Increase CellCept dose to 1 g twice daily Check CBC, CMP Order high-res CT, PFTs  Chilton Greathouse MD  Pulmonary and Critical Care 03/27/2023, 2:15 PM  CC: Roger Kill, *  Addendum:

## 2023-03-27 NOTE — Patient Instructions (Signed)
Check CBC, CMP today Increase CellCept to 1 g twice daily Order follow-up CT scan Order PFTs in 3 months Return to clinic in 3 months.

## 2023-04-02 ENCOUNTER — Telehealth: Payer: Self-pay | Admitting: Pulmonary Disease

## 2023-04-06 NOTE — Telephone Encounter (Signed)
I called and discussed results with patient Can you please send his results from 03/27/2023 by mail as he would like a copy

## 2023-04-08 ENCOUNTER — Other Ambulatory Visit: Payer: Self-pay | Admitting: Cardiovascular Disease

## 2023-04-11 NOTE — Telephone Encounter (Signed)
Copy of lab results placed in mail

## 2023-04-25 ENCOUNTER — Other Ambulatory Visit: Payer: Self-pay | Admitting: Physician Assistant

## 2023-04-25 ENCOUNTER — Inpatient Hospital Stay: Payer: Medicare HMO | Admitting: Physician Assistant

## 2023-04-25 ENCOUNTER — Inpatient Hospital Stay: Payer: Medicare HMO | Attending: Physician Assistant

## 2023-04-25 VITALS — BP 113/56 | HR 67 | Temp 98.3°F | Resp 16 | Ht 72.0 in | Wt 207.1 lb

## 2023-04-25 DIAGNOSIS — D539 Nutritional anemia, unspecified: Secondary | ICD-10-CM

## 2023-04-25 DIAGNOSIS — D696 Thrombocytopenia, unspecified: Secondary | ICD-10-CM | POA: Insufficient documentation

## 2023-04-25 DIAGNOSIS — D72829 Elevated white blood cell count, unspecified: Secondary | ICD-10-CM | POA: Diagnosis present

## 2023-04-25 DIAGNOSIS — Z801 Family history of malignant neoplasm of trachea, bronchus and lung: Secondary | ICD-10-CM | POA: Insufficient documentation

## 2023-04-25 DIAGNOSIS — D7589 Other specified diseases of blood and blood-forming organs: Secondary | ICD-10-CM

## 2023-04-25 DIAGNOSIS — R161 Splenomegaly, not elsewhere classified: Secondary | ICD-10-CM | POA: Diagnosis not present

## 2023-04-25 LAB — CBC WITH DIFFERENTIAL (CANCER CENTER ONLY)
Abs Immature Granulocytes: 0.23 10*3/uL — ABNORMAL HIGH (ref 0.00–0.07)
Basophils Absolute: 0 10*3/uL (ref 0.0–0.1)
Basophils Relative: 0 %
Eosinophils Absolute: 0 10*3/uL (ref 0.0–0.5)
Eosinophils Relative: 0 %
HCT: 36.6 % — ABNORMAL LOW (ref 39.0–52.0)
Hemoglobin: 12.4 g/dL — ABNORMAL LOW (ref 13.0–17.0)
Immature Granulocytes: 2 %
Lymphocytes Relative: 14 %
Lymphs Abs: 1.5 10*3/uL (ref 0.7–4.0)
MCH: 37.1 pg — ABNORMAL HIGH (ref 26.0–34.0)
MCHC: 33.9 g/dL (ref 30.0–36.0)
MCV: 109.6 fL — ABNORMAL HIGH (ref 80.0–100.0)
Monocytes Absolute: 0.7 10*3/uL (ref 0.1–1.0)
Monocytes Relative: 7 %
Neutro Abs: 7.9 10*3/uL — ABNORMAL HIGH (ref 1.7–7.7)
Neutrophils Relative %: 77 %
Platelet Count: 162 10*3/uL (ref 150–400)
RBC: 3.34 MIL/uL — ABNORMAL LOW (ref 4.22–5.81)
RDW: 16.4 % — ABNORMAL HIGH (ref 11.5–15.5)
WBC Count: 10.3 10*3/uL (ref 4.0–10.5)
nRBC: 0 % (ref 0.0–0.2)

## 2023-04-25 LAB — CMP (CANCER CENTER ONLY)
ALT: 12 U/L (ref 0–44)
AST: 15 U/L (ref 15–41)
Albumin: 3.8 g/dL (ref 3.5–5.0)
Alkaline Phosphatase: 54 U/L (ref 38–126)
Anion gap: 3 — ABNORMAL LOW (ref 5–15)
BUN: 24 mg/dL — ABNORMAL HIGH (ref 8–23)
CO2: 32 mmol/L (ref 22–32)
Calcium: 9.1 mg/dL (ref 8.9–10.3)
Chloride: 102 mmol/L (ref 98–111)
Creatinine: 1.03 mg/dL (ref 0.61–1.24)
GFR, Estimated: >60 mL/min (ref >60)
Glucose, Bld: 97 mg/dL (ref 70–99)
Potassium: 4.4 mmol/L (ref 3.5–5.1)
Sodium: 137 mmol/L (ref 135–145)
Total Bilirubin: 1.1 mg/dL (ref 0.3–1.2)
Total Protein: 6.1 g/dL — ABNORMAL LOW (ref 6.5–8.1)

## 2023-04-25 NOTE — Progress Notes (Signed)
[facility] [phone]   [phone]  PROGRESS NOTE  Patient Care Team: Bernita Buffy as PCP - General (Physician Assistant) Nahser, Deloris Ping, MD as PCP - Cardiology (Cardiology) Claud Kelp, MD as Consulting Physician (General Surgery)  Hematological/Oncological History # Elevated metamyelocytes # Thrombocytopenia # Macrocytosis 1) Labs from PCP, Tia McPhatter: -12/29/2020: WBC 9.8, Hgb 14.4, MCV 103.7, Plt 168,  Band Abs 0.8 (H), Myelo Abs 0.4 (H) -01/06/2021: WBC 9.8, Hgb 14.2, MCV 103.4, Plt 207, Mono Manuel Ab 1.1 (H), Meta Absolute 0.2 (H)  2) 01/21/2021: Establish care with Georga Kaufmann PA-C  HISTORY OF PRESENTING ILLNESS:  Jeffrey Stewart 88 y.o. male returns for a follow up. Patient is accompanied by his wife for this visit. He was last seen in clinic on 04/24/2022. In the interim, he denies any changes to his health.   Today, Mr. [name] reports his energy and appetite are overall stable. He does struggle with some insomnia and stay asleep. He denies any overt signs of bleeding including hematochezia or melena. He denies any nausea, vomiting or bowel habit changes. He denies any fevers, chills, night sweats, chest pain or cough. He has no other complaints. Rest of the 10 point ROS is below.   MEDICAL HISTORY:  Past Medical History:  Diagnosis Date   Allergy    Arthritis    MINOR ARTHRITIS FEET AND TOES   Asthma    Cataract    CHF (congestive heart failure) (HCC) 11/2009   EF45-50%   Coronary artery disease    Hyperlipidemia    Myocardial infarction (HCC)    Pneumonia    HX OF PNEUMONIA 4 OR 5 YRS AGO   Rosacea    OF FACE   Shortness of breath    SOMETIMES SOB OR WHEEZING WITH EXERTION    SURGICAL HISTORY: Past Surgical History:  Procedure Laterality Date   APPENDECTOMY     AGE 61   CARDIAC CATHETERIZATION  11/2009   mild / mod cad,MODERATE TIGHT STENOSIS IN THE DISTAL LEFT CIRCUMFLEX ARTERY    ELECTROCARDIOGRAM  11/2009   RBBB   EYE SURGERY     BILATERAL CATARACT EXTRACT EXTRACTION WITH IMPLANTS   INGUINAL HERNIA REPAIR Bilateral 09/29/2013   Procedure: LAPAROSCOPIC BILATERAL  INGUINAL HERNIA;  Surgeon: Ernestene Mention, MD;  Location: [facility];  Service: General;  Laterality: Bilateral;   INSERTION OF MESH Bilateral 09/29/2013   Procedure: INSERTION OF MESH;  Surgeon: Ernestene Mention, MD;  Location: [facility];  Service: General;  Laterality: Bilateral;   last echo 02/2009     last nuc 2009  12/16/2007   EF 33%. ABNORMAL. HE HAS APICAL DEFECT CONSISTENT WITH A SCAR.LV FUNCTION MODERATELY  DEPRESSED   US ECHOCARDIOGRAPHY  02/23/2009   EF 45-50%   US ECHOCARDIOGRAPHY  12/27/2007   EF 45-50%    SOCIAL HISTORY: Social History   Socioeconomic History   Marital status: Married    Spouse name: Not on file   Number of children: Not on file   Years of education: Not on file   Highest education level: Not on file  Occupational History   Not on file  Tobacco Use   Smoking status: Never   Smokeless tobacco: Never  Vaping Use   Vaping status: Never Used  Substance and Sexual Activity   Alcohol use: Not Currently    Comment: 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   Drug use: No   Sexual activity: Not on file  Other  Topics Concern   Not on file  Social History Narrative   Not on file   Social Determinants of Health   Financial Resource Strain: Not on file  Food Insecurity: Not on file  Transportation Needs: Not on file  Physical Activity: Not on file  Stress: Not on file  Social Connections: Not on file  Intimate Partner Violence: Not on file    FAMILY HISTORY: Family History  Problem Relation Age of Onset   Diabetes Sister    Lung cancer Sister        smoker    ALLERGIES:  is allergic to niaspan [niacin] and meloxicam.  MEDICATIONS:  Current Outpatient Medications  Medication Sig Dispense Refill   acetaminophen (TYLENOL) 500 MG tablet Take 1,000 mg by mouth every 6 (six) hours  as needed for moderate pain.     albuterol (VENTOLIN HFA) 108 (90 Base) MCG/ACT inhaler Inhale 2 puffs into the lungs every 4 (four) hours as needed for wheezing or shortness of breath. 1 each 1   aspirin EC 81 MG tablet Take 81 mg by mouth every evening.     atorvastatin (LIPITOR) 40 MG tablet Take 1 tablet (40 mg total) by mouth at bedtime. 90 tablet 3   brimonidine (ALPHAGAN) 0.2 % ophthalmic solution 1 drop 2 (two) times daily. 1 drop 2 (two) times daily.     carvedilol (COREG) 6.25 MG tablet TAKE 1 TABLET BY MOUTH TWICE A DAY 60 tablet 11   cetirizine (ZYRTEC) 10 MG tablet Take 10 mg by mouth at bedtime.     cholecalciferol (VITAMIN D) 1000 UNITS tablet Take 1,000 Units by mouth daily.     fish oil-omega-3 fatty acids 1000 MG capsule Take 1 g by mouth 2 (two) times daily.     fluticasone (FLONASE) 50 MCG/ACT nasal spray Place 1-2 sprays into both nostrils daily.     Fluticasone-Salmeterol (ADVAIR) 250-50 MCG/DOSE AEPB Inhale 1 puff into the lungs 2 (two) times daily.     glucosamine-chondroitin 500-400 MG tablet Take 1 tablet by mouth daily.     ipratropium-albuterol (DUONEB) 0.5-2.5 (3) MG/3ML SOLN SMARTSIG:3 Milliliter(s) Via Nebulizer Every 6 Hours PRN     latanoprost (XALATAN) 0.005 % ophthalmic solution 1 drop at bedtime.     lisinopril (ZESTRIL) 2.5 MG tablet TAKE 1 TABLET EVERY DAY 90 tablet 3   metroNIDAZOLE (METROCREAM) 0.75 % cream Apply 1 application topically 2 (two) times daily.      minocycline (DYNACIN) 50 MG tablet Take 50 mg by mouth 2 (two) times daily.     mycophenolate (CELLCEPT) 500 MG tablet Take 2 tablets (1,000 mg total) by mouth 2 (two) times daily. 360 tablet 3   NEOMYCIN-POLYMYXIN-HYDROCORTISONE (CORTISPORIN) 1 % SOLN OTIC solution Place 1 drop into both ears as needed.     nitroGLYCERIN (NITROSTAT) 0.4 MG SL tablet Place 1 tablet (0.4 mg total) under the tongue every 5 (five) minutes as needed for chest pain. 25 tablet 5   Polyethyl Glycol-Propyl Glycol 0.4-0.3 %  SOLN Place 1 drop into both eyes 2 (two) times daily as needed (dry eyes).     potassium chloride SA (KLOR-CON M) 20 MEQ tablet Take 1 tablet (20 mEq total) by mouth daily. 90 tablet 3   predniSONE (DELTASONE) 5 MG tablet TAKE 5 MG TABLET BY MOUTH IN THE MORNING AND 1 MG TABLET BY MOUTH AT BEDTIME     sodium chloride (OCEAN) 0.65 % nasal spray Place 1 spray into the nose as needed for congestion.  torsemide (DEMADEX) 20 MG tablet TAKE 1 TABLET BY MOUTH EVERY DAY 90 tablet 2   No current facility-administered medications for this visit.    REVIEW OF SYSTEMS:   Constitutional: ( - ) fevers, ( - )  chills , ( - ) night sweats Eyes: ( - ) blurriness of vision, ( - ) double vision, ( - ) watery eyes Ears, nose, mouth, throat, and face: ( - ) mucositis, ( - ) sore throat Respiratory: ( -) cough, ( - ) dyspnea, ( -) wheezes  Cardiovascular: ( - ) palpitation, ( - ) chest discomfort, ( + ) lower extremity swelling Gastrointestinal:  ( - ) nausea, ( - ) heartburn, ( - ) change in bowel habits Skin: ( - ) abnormal skin rashes Lymphatics: ( - ) new lymphadenopathy, ( - ) easy bruising Neurological: ( - ) numbness, ( - ) tingling, ( - ) new weaknesses Behavioral/Psych: ( - ) mood change, ( - ) new changes  All other systems were reviewed with the patient and are negative.  PHYSICAL EXAMINATION: ECOG PERFORMANCE STATUS: 1 - Symptomatic but completely ambulatory  Vitals:   04/25/23 1041  BP: (!) 113/56  Pulse: 67  Resp: 16  Temp: 98.3 F (36.8 C)  SpO2: 97%   Filed Weights   04/25/23 1041  Weight: 207 lb 1.6 oz (93.9 kg)    GENERAL: well appearing male in NAD  SKIN: skin color, texture, turgor are normal, no rashes or significant lesions. Bruising over both arms.  EYES: conjunctiva are pink and non-injected, sclera clear LUNGS: clear to auscultation and percussion with normal breathing effort HEART: regular rate & rhythm and no murmurs. Edema in ankles and distal lower leg  bilaterally.  Musculoskeletal: no cyanosis of digits and no clubbing  PSYCH: alert & oriented x 3, fluent speech NEURO: no focal motor/sensory deficits  LABORATORY DATA:  I have reviewed the data as listed    Latest Ref Rng & Units 04/25/2023   10:17 AM 03/27/2023    3:00 PM 06/19/2022    4:05 PM  CBC  WBC 4.0 - 10.5 K/uL 10.3  9.3  9.0   Hemoglobin 13.0 - 17.0 g/dL 29.5  62.1  30.8   Hematocrit 39.0 - 52.0 % 36.6  38.9  40.2   Platelets 150 - 400 K/uL 162  177.0  157        Latest Ref Rng & Units 04/25/2023   10:17 AM 03/27/2023    3:00 PM 06/19/2022    4:05 PM  CMP  Glucose 70 - 99 mg/dL 97  657  846   BUN 8 - 23 mg/dL 24  22  22    Creatinine 0.61 - 1.24 mg/dL 9.62  9.52  8.41   Sodium 135 - 145 mmol/L 137  139  136   Potassium 3.5 - 5.1 mmol/L 4.4  4.3  3.8   Chloride 98 - 111 mmol/L 102  100  99   CO2 22 - 32 mmol/L 32  32  29   Calcium 8.9 - 10.3 mg/dL 9.1  9.3  9.4   Total Protein 6.5 - 8.1 g/dL 6.1  6.3    Total Bilirubin 0.3 - 1.2 mg/dL 1.1  1.2    Alkaline Phos 38 - 126 U/L 54  59    AST 15 - 41 U/L 15  15    ALT 0 - 44 U/L 12  11      ASSESSMENT & PLAN Emanuelle A Westermeyer is a 88  y.o. male that presents to the clinic for a follow up for the following:  #Leukocytosis, neutrophil predominant --Labs today show normal WBC with neutrophilic predominance --He denies any signs of active infection at this time.  --Monitor for now.   #Macrocytic anemia: --Etiology unknown --Patient is not taking any medication that could cause macrocytosis --Patient has not consumed alcohol recently but reports prior heavy consumption --prior labs showed no evidence of vitamin B12 or folate deficiency.  --Today labs mild anemia with Hgb 12.4, MCV 109.6.  --Monitor for now and consider bone marrow biopsy if anemia worsens (Hgb <10)   #Thrombocytopenia: -Mild and levels oscillate back and forth to normal. Today's Platelet count is 162K -Record review shows CT scan from 08/28/2013 that  shows mild splenomegaly.  -Abdominal US confirms continued splenomegaly with no overt liver disease.   Follow up:  -follow up visit in 6 months given mild anemia noted in labs today. Recommend prompt re-referral if the patient were to have any worsening of the blood counts.    No orders of the defined types were placed in this encounter.  All questions were answered. The patient knows to call the clinic with any problems, questions or concerns.  I have spent a total of 30 minutes minutes of face-to-face and non-face-to-face time, preparing to see the patient, performing a medically appropriate examination, counseling and educating the patient, ordering tests, documenting clinical information in the electronic health record,and care coordination.   Georga Kaufmann PA-C Dept of Hematology and Oncology [facility] Phone: [phone]

## 2023-05-06 ENCOUNTER — Other Ambulatory Visit: Payer: Self-pay | Admitting: Cardiovascular Disease

## 2023-05-09 ENCOUNTER — Other Ambulatory Visit: Payer: Self-pay | Admitting: Cardiovascular Disease

## 2023-06-04 ENCOUNTER — Ambulatory Visit
Admission: RE | Admit: 2023-06-04 | Discharge: 2023-06-04 | Disposition: A | Discharge status: Home / Self Care | Payer: Medicare HMO | Source: Ambulatory Visit | Attending: Pulmonary Disease | Admitting: Pulmonary Disease

## 2023-06-04 DIAGNOSIS — J849 Interstitial pulmonary disease, unspecified: Secondary | ICD-10-CM

## 2023-06-23 ENCOUNTER — Encounter: Payer: Self-pay | Admitting: Cardiovascular Disease

## 2023-06-23 NOTE — Progress Notes (Unsigned)
Cardiology Office Note   Date:  06/25/2023   ID:  Jeffrey Stewart, DOB 09-Feb-1935, MRN [id number]  PCP:  Roger Kill, PA-C  Cardiologist: Kristeen Miss, MD   Chief Complaint  Patient presents with   Coronary Artery Disease        Hyperlipidemia   1. Congestive heart failure-chronic systolic congestive heart failure Left ventricle: The cavity size was moderately to severely   dilated. Wall thickness was normal. Systolic function was   normal. The estimated ejection fraction was in the range   of 50% to 55%. Wall motion was normal; there were no   regional wall motion abnormalities. There was an increased   relative contribution of atrial contraction to ventricular   filling. - Aortic valve: Mild regurgitation. - Left atrium: The atrium was mildly dilated. - Right ventricle: The cavity size was moderately dilated.   Systolic function was mildly reduced. - Right atrium: The atrium was mildly dilated 2. RBBB 3. Mild coronary artery disease- The left anterior descending artery is moderately calcified.  There is a  mid 40%-50% stenosis right at the takeoff of the first diagonal artery. D1 - moderate stenosis.   The left circumflex artery is large and is dominant.   80-90% PDA stenosis   The ramus intermediate vessel has minor luminal irregularities.  The right coronary artery is small, it is nondominant.   LV gram -  EF  35-40%.  There is hypokinesis of the anterior wall,  apex, and inferoapical wall.  The aortic root appears to be moderately  dilated.  4. Hyperlipidemia   88 yo gentleman with a hx of mild CHF.     He walks occasionally.  He has been exercising on a regular basis. He works out in his garden and walks on his treadmill in the evenings.   He's not had any significant episodes of chest pain or shortness breath.   Feb. 6, 2014  Jeffrey Stewart is doing well since I last saw him. He's not had any episodes of chest pain or shortness breath. He's exercising on a  regular basis.  March 10, 2013:  Jeffrey Stewart is doing well. He's not had any episodes of chest pain or shortness breath.  Playing golf, fishing, gardening, boating this past summer.   Tries to avoid salt.  No dypsnea.    Feb. 24, 2015:  Pt is doing OK.  No CP.  He has some abdominal issues - has 2 hernias.  Will be seen the surgeons today.  Sept. 3, 2015:  Jeffrey Stewart is doing well.  Had his hernias repaired No CP, no dyspnea.  Staying active.  Walks regularly.    September 15, 2014:   Jeffrey Stewart is a 88 y.o. male who presents for follow up of his CHF Occasional DOE, no CP.    Aug. 30, 2016:  Doing well.  Has some allergies . No CP or dyspnea.  Works out in the garden   Feb. 28, 2017:  Doing well.  Working in his garden   Oct. 17, 2017: Has had a rough summer. Injured his shoulders working on his floating dock this summer.  Now he is having aches all over , thinks its the atorvastatin  Lots of leg and feet swelling   Jan. 11, 2019:   history of chronic systolic congestive heart failure.  His last echocardiogram reveals normal left ventricular systolic function. He still has some occasional shortness of breath with exertion. He does not exercise quite as much as  he used to.  Is raising his great grandchildren .  January 28, 2018: Doing ok Raising 2 of his great grandchildren - 2 teenage girls. No CP,  Some Doe, Raising his garden     Jan.. 13, 2020 No CP ,  doingwell Breathing is ok Stays busy - raises his great grandchildren    1  Enjoys her garden .    Jan. 15, 2021 Doing well.   bp is  A bit low today  Eating and drinking well  No CP or dyspnea   Jan. 3, 2022 Jeffrey Stewart is seen today for follow up of his CHF Has been walking 1-1.5 miles a day .  No cp, no significant dyspnea  His greatgrandchildren are not living with him.   They were placed in a foster home and have run away .   He does not know how to contact them   August 09, 2021:   Jeffrey Stewart is seen today for  follow-up visit.  His health has generally declined since I last saw him a year ago. Has interstitial lung disease , pulmonary fibrosis  No cp  Unable to walk  February 14, 2022: Jeffrey Stewart seen today for a follow-up of his coronary artery disease.  He has interstitial lung disease and pulmonary fibrosis. Breathing is still a challenge  Uses his nebulizer several times a day  No CP ,   Dec. 9, 2024 Jeffrey Stewart is seen for follow up of his interstitial lung disease, pulmonary fibrosis, CHF Seems to be stable  No CP  Breathing is so so  BP is low.   He is on low dose lisinopril for EF 50% Will DC Lisinopril  He still eats a fairly unrestricted diet        Past Medical History:  Diagnosis Date   Allergy    Arthritis    MINOR ARTHRITIS FEET AND TOES   Asthma    Cataract    CHF (congestive heart failure) (HCC) 11/2009   EF45-50%   Coronary artery disease    Hyperlipidemia    Myocardial infarction (HCC)    Pneumonia    HX OF PNEUMONIA 4 OR 5 YRS AGO   Rosacea    OF FACE   Shortness of breath    SOMETIMES SOB OR WHEEZING WITH EXERTION    Past Surgical History:  Procedure Laterality Date   APPENDECTOMY     AGE 75   CARDIAC CATHETERIZATION  11/2009   mild / mod cad,MODERATE TIGHT STENOSIS IN THE DISTAL LEFT CIRCUMFLEX ARTERY   ELECTROCARDIOGRAM  11/2009   RBBB   EYE SURGERY     BILATERAL CATARACT EXTRACT EXTRACTION WITH IMPLANTS   INGUINAL HERNIA REPAIR Bilateral 09/29/2013   Procedure: LAPAROSCOPIC BILATERAL  INGUINAL HERNIA;  Surgeon: Ernestene Mention, MD;  Location: [facility];  Service: General;  Laterality: Bilateral;   INSERTION OF MESH Bilateral 09/29/2013   Procedure: INSERTION OF MESH;  Surgeon: Ernestene Mention, MD;  Location: [facility];  Service: General;  Laterality: Bilateral;   last echo 02/2009     last nuc 2009  12/16/2007   EF 33%. ABNORMAL. HE HAS APICAL DEFECT CONSISTENT WITH A SCAR.LV FUNCTION MODERATELY  DEPRESSED   US ECHOCARDIOGRAPHY  02/23/2009   EF 45-50%   US  ECHOCARDIOGRAPHY  12/27/2007   EF 45-50%     Current Outpatient Medications  Medication Sig Dispense Refill   acetaminophen (TYLENOL) 500 MG tablet Take 1,000 mg by mouth every 6 (six) hours as needed for moderate pain.  albuterol (VENTOLIN HFA) 108 (90 Base) MCG/ACT inhaler Inhale 2 puffs into the lungs every 4 (four) hours as needed for wheezing or shortness of breath. 1 each 1   aspirin EC 81 MG tablet Take 81 mg by mouth every evening.     atorvastatin (LIPITOR) 40 MG tablet Take 1 tablet (40 mg total) by mouth at bedtime. 90 tablet 3   brimonidine (ALPHAGAN) 0.2 % ophthalmic solution 1 drop 2 (two) times daily. 1 drop 2 (two) times daily.     carvedilol (COREG) 6.25 MG tablet TAKE 1 TABLET BY MOUTH TWICE A DAY 60 tablet 11   cetirizine (ZYRTEC) 10 MG tablet Take 10 mg by mouth at bedtime.     cholecalciferol (VITAMIN D) 1000 UNITS tablet Take 1,000 Units by mouth daily.     fish oil-omega-3 fatty acids 1000 MG capsule Take 1 g by mouth 2 (two) times daily.     fluticasone (FLONASE) 50 MCG/ACT nasal spray Place 1-2 sprays into both nostrils daily.     Fluticasone-Salmeterol (ADVAIR) 250-50 MCG/DOSE AEPB Inhale 1 puff into the lungs 2 (two) times daily.     glucosamine-chondroitin 500-400 MG tablet Take 1 tablet by mouth daily.     ipratropium-albuterol (DUONEB) 0.5-2.5 (3) MG/3ML SOLN SMARTSIG:3 Milliliter(s) Via Nebulizer Every 6 Hours PRN     latanoprost (XALATAN) 0.005 % ophthalmic solution 1 drop at bedtime.     metroNIDAZOLE (METROCREAM) 0.75 % cream Apply 1 application topically 2 (two) times daily.      minocycline (DYNACIN) 50 MG tablet Take 50 mg by mouth 2 (two) times daily.     mycophenolate (CELLCEPT) 500 MG tablet Take 2 tablets (1,000 mg total) by mouth 2 (two) times daily. 360 tablet 3   NEOMYCIN-POLYMYXIN-HYDROCORTISONE (CORTISPORIN) 1 % SOLN OTIC solution Place 1 drop into both ears as needed.     nitroGLYCERIN (NITROSTAT) 0.4 MG SL tablet Place 1 tablet (0.4 mg total)  under the tongue every 5 (five) minutes as needed for chest pain. 25 tablet 5   Polyethyl Glycol-Propyl Glycol 0.4-0.3 % SOLN Place 1 drop into both eyes 2 (two) times daily as needed (dry eyes).     potassium chloride SA (KLOR-CON M) 20 MEQ tablet TAKE 1 TABLET EVERY DAY 90 tablet 3   predniSONE (DELTASONE) 1 MG tablet Take 1 mg by mouth at bedtime.     predniSONE (DELTASONE) 5 MG tablet TAKE 5 MG TABLET BY MOUTH IN THE MORNING AND 1 MG TABLET BY MOUTH AT BEDTIME     sodium chloride (OCEAN) 0.65 % nasal spray Place 1 spray into the nose as needed for congestion.     torsemide (DEMADEX) 20 MG tablet TAKE 1 TABLET BY MOUTH EVERY DAY 90 tablet 2   No current facility-administered medications for this visit.    Allergies:   Niaspan [niacin] and Meloxicam    Social History:  The patient  reports that he has never smoked. He has never used smokeless tobacco. He reports that he does not currently use alcohol. He reports that he does not use drugs.   Family History:  The patient's family history includes Diabetes in his sister; Lung cancer in his sister.    ROS:   Noted in current hx.  Otherwise negative.    Physical Exam: Blood pressure (!) 92/50, pulse 71, height 6' (1.829 m), weight 204 lb 3.2 oz (92.6 kg), SpO2 92%.       GEN:  elderly , frail man  in no acute distress HEENT: Normal  NECK: No JVD; No carotid bruits LYMPHATICS: No lymphadenopathy CARDIAC: RRR , no murmurs, rubs, gallops RESPIRATORY:  chronic rales, L>R.  ABDOMEN: Soft, non-tender, non-distended MUSCULOSKELETAL:  chronic stasis changes, minimal edema  SKIN: Warm and dry NEUROLOGIC:  Alert and oriented x 3   EKG:     EKG Interpretation Date/Time:  Monday June 25 2023 11:01:15 EST Ventricular Rate:  65 PR Interval:  170 QRS Duration:  138 QT Interval:  434 QTC Calculation: 451 R Axis:   18  Text Interpretation: Normal sinus rhythm Right bundle branch block When compared with ECG of 19-Jun-2022 16:06,  Vent. rate has decreased BY  35 BPM QRS axis Shifted right Criteria for Lateral infarct are no longer Present T wave inversion no longer evident in Anterior leads Confirmed by Kristeen Miss ([id number]) on 06/25/2023 11:29:57 AM     .   Recent Labs: 04/25/2023: ALT 12; BUN 24; Creatinine 1.03; Hemoglobin 12.4; Platelet Count 162; Potassium 4.4; Sodium 137    Lipid Panel    Component Value Date/Time   CHOL 109 02/20/2023 0944   TRIG 97 02/20/2023 0944   HDL 36 (L) 02/20/2023 0944   CHOLHDL 3.0 02/20/2023 0944   CHOLHDL 3.7 05/02/2016 0856   VLDL 15 05/02/2016 0856   LDLCALC 54 02/20/2023 0944      Wt Readings from Last 3 Encounters:  06/25/23 204 lb 3.2 oz (92.6 kg)  04/25/23 207 lb 1.6 oz (93.9 kg)  03/27/23 206 lb (93.4 kg)      Other studies Reviewed: Additional studies/ records that were reviewed today include: . Review of the above records demonstrates:    ASSESSMENT AND PLAN:  1. Congestive heart failure-chronic systolic congestive heart failure  His LVEF is ~ 50 % He is very weak and his BP is low.  Will need to hold his Lisinopril  Cont Torsemide      2. RBBB-     3. Mild coronary artery disease-     no angina     4. Hyperlipidemia  -     5.   Interstitial Lung disease.   Further  plans per pulmonary      Current medicines are reviewed at length with the patient today.  The patient does not have concerns regarding medicines.  The following changes have been made:  no change  Disposition:       Signed, Kristeen Miss, MD  06/25/2023 11:30 AM    [facility] [address] [place], [place], [place]  [address] Phone: [phone]; Fax: [phone]

## 2023-06-25 ENCOUNTER — Encounter: Payer: Self-pay | Admitting: Cardiovascular Disease

## 2023-06-25 ENCOUNTER — Ambulatory Visit: Payer: Medicare HMO | Attending: Cardiovascular Disease | Admitting: Cardiovascular Disease

## 2023-06-25 VITALS — BP 92/50 | HR 71 | Ht 72.0 in | Wt 204.2 lb

## 2023-06-25 DIAGNOSIS — I2510 Atherosclerotic heart disease of native coronary artery without angina pectoris: Secondary | ICD-10-CM

## 2023-06-25 NOTE — Patient Instructions (Signed)
Medication Instructions:  Please discontinue your Lisinopril. Continue all other medications as listed.  *If you need a refill on your cardiac medications before your next appointment, please call your pharmacy*   Follow-Up: At [facility], you and your health needs are our priority.  As part of our continuing mission to provide you with exceptional heart care, we have created designated Provider Care Teams.  These Care Teams include your primary Cardiologist (physician) and Advanced Practice Providers (APPs -  Physician Assistants and Nurse Practitioners) who all work together to provide you with the care you need, when you need it.  We recommend signing up for the patient portal called "MyChart".  Sign up information is provided on this After Visit Summary.  MyChart is used to connect with patients for Virtual Visits (Telemedicine).  Patients are able to view lab/test results, encounter notes, upcoming appointments, etc.  Non-urgent messages can be sent to your provider as well.   To learn more about what you can do with MyChart, go to [email or web address].    Your next appointment:   6 month(s)  Provider:   Jari Favre, PA-C, Robin Searing, NP, Jacolyn Reedy, PA-C, Eligha Bridegroom, NP, Tereso Newcomer, PA-C, or Perlie Gold, PA-C

## 2023-07-05 ENCOUNTER — Ambulatory Visit (HOSPITAL_BASED_OUTPATIENT_CLINIC_OR_DEPARTMENT_OTHER): Payer: Medicare HMO | Admitting: Pulmonary Disease

## 2023-07-05 ENCOUNTER — Encounter: Payer: Self-pay | Admitting: Pulmonary Disease

## 2023-07-05 ENCOUNTER — Ambulatory Visit: Payer: Medicare HMO | Admitting: Pulmonary Disease

## 2023-07-05 VITALS — BP 138/68 | HR 83 | Temp 98.1°F | Ht 72.0 in | Wt 209.6 lb

## 2023-07-05 DIAGNOSIS — Z5181 Encounter for therapeutic drug level monitoring: Secondary | ICD-10-CM

## 2023-07-05 DIAGNOSIS — J849 Interstitial pulmonary disease, unspecified: Secondary | ICD-10-CM | POA: Diagnosis not present

## 2023-07-05 LAB — PULMONARY FUNCTION TEST
DL/VA % pred: 80 %
DL/VA: 3.08 ml/min/mmHg/L
DLCO cor % pred: 55 %
DLCO cor: 13.08 ml/min/mmHg
DLCO unc % pred: 55 %
DLCO unc: 13.08 ml/min/mmHg
FEF 25-75 Post: 1.34 L/s
FEF 25-75 Pre: 0.89 L/s
FEF2575-%Change-Post: 49 %
FEF2575-%Pred-Post: 79 %
FEF2575-%Pred-Pre: 53 %
FEV1-%Change-Post: 8 %
FEV1-%Pred-Post: 66 %
FEV1-%Pred-Pre: 61 %
FEV1-Post: 1.75 L
FEV1-Pre: 1.61 L
FEV1FVC-%Change-Post: 3 %
FEV1FVC-%Pred-Pre: 99 %
FEV6-%Change-Post: 6 %
FEV6-%Pred-Post: 69 %
FEV6-%Pred-Pre: 65 %
FEV6-Post: 2.42 L
FEV6-Pre: 2.27 L
FEV6FVC-%Change-Post: 1 %
FEV6FVC-%Pred-Post: 107 %
FEV6FVC-%Pred-Pre: 105 %
FVC-%Change-Post: 5 %
FVC-%Pred-Post: 64 %
FVC-%Pred-Pre: 61 %
FVC-Post: 2.43 L
FVC-Pre: 2.31 L
Post FEV1/FVC ratio: 72 %
Post FEV6/FVC ratio: 99 %
Pre FEV1/FVC ratio: 70 %
Pre FEV6/FVC Ratio: 98 %
RV % pred: 94 %
RV: 2.61 L
TLC % pred: 69 %
TLC: 4.9 L

## 2023-07-05 NOTE — Progress Notes (Signed)
Full PFT Performed Today  

## 2023-07-05 NOTE — Patient Instructions (Signed)
VISIT SUMMARY:  During your follow-up visit, we discussed your interstitial lung disease, hypertension, and new complaint of neck pain. You reported feeling well overall and have been tolerating your current medications without any issues.  YOUR PLAN:  -INTERSTITIAL LUNG DISEASE: Interstitial lung disease refers to a group of disorders that cause scarring of the lungs, making it difficult to breathe. Your condition is stable, and your lung function has shown slight improvement. You should continue taking Cellcept as prescribed (two pills in the morning and two pills in the evening). We will also obtain labs today to monitor the effects of the medication. Please plan for a re-evaluation in 6 months or sooner if your symptoms worsen.  -HYPERTENSION: Hypertension, or high blood pressure, is a condition where the force of the blood against your artery walls is too high. Your blood pressure is well-controlled without the use of Lisinopril, and you have not experienced any symptoms of low blood pressure. Continue to monitor your blood pressure regularly.  -NECK PAIN: Your neck pain may be related to your sleeping position or underlying arthritis. It is not likely related to your current medications. Consider changing your sleeping position to see if it helps alleviate the pain. Continue managing your underlying conditions as advised.  INSTRUCTIONS:  Please obtain the lab tests today to monitor the effects of your medication. Plan for a re-evaluation in 6 months or sooner if your symptoms worsen. Continue to monitor your blood pressure regularly.

## 2023-07-05 NOTE — Progress Notes (Signed)
Jeffrey Stewart    [id number]    06-01-1935  Primary Care Physician:Williams, Karis Juba, PA-C  Referring Physician: Bernita Buffy [address],  [place] [address]  Chief complaint: Follow-up for interstitial lung disease, abnormal CT scan  HPI: 88 y.o.  with history of CHF, coronary artery disease.   Complains of worsening dyspnea on exertion for the past year.  Has occasional cough with mucus.  Denies any wheezing, fevers, chills He was treated for a community-acquired pneumonia in July 2022.  A follow-up CT showed interstitial changes with basilar fibrosis and mild groundglass opacity in the right upper lobe.  He has been referred here for further evaluation.  Developed COVID-19 and November 2022 treated with molnupiravir.  He also got a Z-Pak and prednisone around that time Follows with Dr. Elease Hashimoto for heart failure.  He is following with Dr. Dierdre Forth, rheumatology for polymyalgia rheumatica and seronegative rheumatoid arthritis.  He is on chronic prednisone at 6 mg from rheumatology  Started on CellCept at 500 mg/day in January 2024.  Dose increased to 1gm daily in sept 2024  Received note from Dr. Dierdre Forth dated 01/31/2022 Patient is being followed for polymyalgia rheumatica and has features of seronegative rheumatoid arthritis.  He had failed a trial of methotrexate previously Joint symptoms are stable at present Unable to taper prednisone below 6 mg.   Pets: No pets Occupation: Used to work in [employer] when he was young.  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  No mold, hot tub, Jacuzzi.  Does not recall exposure to asbestos.  He has feather pillows and comforters that he is used for many years Smoking history: Never smoker Travel history: No significant travel history Relevant family history: No family history of lung disease  Interval history: Discussed the use of  AI scribe software for clinical note transcription with the patient, who gave verbal consent to proceed.  The patient, with a history of interstitial lung disease, is currently on Cellcept, taking two pills in the morning and two pills in the evening. He reports tolerating the medication well with no adverse effects. His lung function test today shows slight improvement compared to earlier this year, indicating increased lung capacity. However, he has been experiencing low blood pressure, with readings as low as ninety-two over fifty. As a result, his heart doctor has taken him off Lisinopril. He also reports a sore neck, which he describes as bone pain. This discomfort is not typical for him and he is unsure if it is a side effect of the medication or due to sleeping in a recliner.   Outpatient Encounter Medications as of 07/05/2023  Medication Sig   acetaminophen (TYLENOL) 500 MG tablet Take 1,000 mg by mouth every 6 (six) hours as needed for moderate pain.   albuterol (VENTOLIN HFA) 108 (90 Base) MCG/ACT inhaler Inhale 2 puffs into the lungs every 4 (four) hours as needed for wheezing or shortness of breath.   aspirin EC 81 MG tablet Take 81 mg by mouth every evening.   atorvastatin (LIPITOR) 40 MG tablet Take 1 tablet (40 mg total) by mouth at bedtime.   brimonidine (ALPHAGAN) 0.2 % ophthalmic solution 1 drop 2 (two) times daily. 1 drop 2 (two) times daily.   carvedilol (COREG) 6.25 MG tablet TAKE 1 TABLET BY MOUTH TWICE A DAY   cetirizine (ZYRTEC) 10 MG tablet Take 10 mg by  mouth at bedtime.   cholecalciferol (VITAMIN D) 1000 UNITS tablet Take 1,000 Units by mouth daily.   fish oil-omega-3 fatty acids 1000 MG capsule Take 1 g by mouth 2 (two) times daily.   fluticasone (FLONASE) 50 MCG/ACT nasal spray Place 1-2 sprays into both nostrils daily.   Fluticasone-Salmeterol (ADVAIR) 250-50 MCG/DOSE AEPB Inhale 1 puff into the lungs 2 (two) times daily.   glucosamine-chondroitin 500-400 MG tablet Take  1 tablet by mouth daily.   ipratropium-albuterol (DUONEB) 0.5-2.5 (3) MG/3ML SOLN SMARTSIG:3 Milliliter(s) Via Nebulizer Every 6 Hours PRN   latanoprost (XALATAN) 0.005 % ophthalmic solution 1 drop at bedtime.   metroNIDAZOLE (METROCREAM) 0.75 % cream Apply 1 application topically 2 (two) times daily.    minocycline (DYNACIN) 50 MG tablet Take 50 mg by mouth 2 (two) times daily.   mycophenolate (CELLCEPT) 500 MG tablet Take 2 tablets (1,000 mg total) by mouth 2 (two) times daily.   NEOMYCIN-POLYMYXIN-HYDROCORTISONE (CORTISPORIN) 1 % SOLN OTIC solution Place 1 drop into both ears as needed.   nitroGLYCERIN (NITROSTAT) 0.4 MG SL tablet Place 1 tablet (0.4 mg total) under the tongue every 5 (five) minutes as needed for chest pain.   Polyethyl Glycol-Propyl Glycol 0.4-0.3 % SOLN Place 1 drop into both eyes 2 (two) times daily as needed (dry eyes).   potassium chloride SA (KLOR-CON M) 20 MEQ tablet TAKE 1 TABLET EVERY DAY   predniSONE (DELTASONE) 1 MG tablet Take 1 mg by mouth at bedtime.   predniSONE (DELTASONE) 5 MG tablet TAKE 5 MG TABLET BY MOUTH IN THE MORNING AND 1 MG TABLET BY MOUTH AT BEDTIME   sodium chloride (OCEAN) 0.65 % nasal spray Place 1 spray into the nose as needed for congestion.   torsemide (DEMADEX) 20 MG tablet TAKE 1 TABLET BY MOUTH EVERY DAY   No facility-administered encounter medications on file as of 07/05/2023.    Physical Exam: Blood pressure 138/68, pulse 83, temperature 98.1 F (36.7 C), temperature source Oral, height 6' (1.829 m), weight 209 lb 9.6 oz (95.1 kg), SpO2 96%. Gen:      No acute distress HEENT:  EOMI, sclera anicteric Neck:     No masses; no thyromegaly Lungs:   Bibasal crackles CV:         Regular rate and rhythm; no murmurs Abd:      + bowel sounds; soft, non-tender; no palpable masses, no distension Ext:    No edema; adequate peripheral perfusion Skin:      Warm and dry; no rash Neuro: alert and oriented x 3 Psych: normal mood and affect    Data Reviewed: Imaging: CT abdomen pelvis 08/28/2013-mild reticular changes at the base CT chest 01/31/2021-basal fibrotic changes with mild traction bronchiectasis, scattered groundglass pulmonary infiltrates in the posterior right upper lobe and the left lower lobe. 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  Probable bronchopneumonia in the right upper lobe with areas of consolidation and pre-existing region interstitial lung disease High-resolution CT 07/28/2021-improvement in inflammatory changes of pneumonia.  Stable probable UIP pattern pulmonary fibrosis CT high-resolution 03/30/2022-minimal worsening of pulmonary fibrosis and probable UIP pattern CT high resolution 06/04/2023-stable pattern of interstitial lung disease and probable UIP pattern. I have reviewed the images personally.  PFTs: 05/06/2021 FVC 2.39 (59%), FEV1 1.70 60%, F/F 71, TLC 4.67 64%, DLCO 12.91 52% Severe restriction, diffusion defect  07/28/2022 FVC 2.47 [62%], FEV1 1.76 [62%], F/F 71, TLC 4.81 [56%], DLCO 11.82 [48%] Severe restriction, diffusion defect  07/05/2023 FVC 2.43 (4%), FEV1 1.75 (  6%), F/F72, TLC 4.90 (69%], DLCO 13.08 [55%] Moderate restriction, diffusion defect  Labs: CTD serologies 03/15/2021-  SCL 100- 25  Cardiac: Echocardiogram 09/18/2013 LV cavity is dilated, EF 50-55%, moderate dilatation of RV cavity with mild reduction in systolic function.  Echocardiogram 08/25/2021 LVEF 50%, grade 1 diastolic dysfunction, normal RV systolic size and function.  Assessment:  Evaluation for interstitial lung disease. I have reviewed his CT scan which shows a basilar pattern of pulmonary fibrosis.  This appears to be in probable UIP pattern and has remained stable on follow-up imaging.  PFTs reviewed with restriction and diffusion defect  Differential diagnosis at this point is connective tissue disease, chronic hypersensitivity pneumonitis versus IPF He has gotten rid of  his down comforter and pillows since September 2022 CTD serologies are negative borderline elevation in SCL 100 which is likely nonspecific as he does not have any symptoms.  However he is following with Dr. Dierdre Forth, rheumatology for inflammatory arthritis and is on chronic prednisone at 6 mg.  He has worsening dyspnea with reduction in diffusion capacity.  Although CT scan is stable last year I believe he may have worsening ILD We have started him on CellCept in January 2024.   Stable on current regimen of Cellcept (2 pills BID). Lung function test shows slight improvement and CT scan shows no progression of disease. No current side effects reported from medication. -Continue current dose of Cellcept. -Obtain labs today for monitoring due to medication. -Plan for reevaluation in 6 months or sooner if symptoms worsen.  Hypertension Lisinopril discontinued due to episodes of low blood pressure. Current blood pressure within normal range. -Monitor blood pressure regularly at home.  Neck Pain Patient reports neck pain, possibly due to sleeping position or arthritis. No clear link to current medication. -Consider evaluation for appropriate sleeping position and management of arthritis pain.  Polymyalgia Rheumatica Managed with prednisone. No current complaints related to this condition. -Continue current management.   Plan/Recommendations: Continue CellCept 1 g twice daily Check CBC, CMP  Chilton Greathouse MD [facility] 07/05/2023, 3:09 PM  CC: Roger Kill, *  Addendum:

## 2023-07-05 NOTE — Patient Instructions (Signed)
Full PFT Performed Today  

## 2023-07-06 LAB — CBC WITH DIFFERENTIAL/PLATELET
Basophils Absolute: 0.1 10*3/uL (ref 0.0–0.1)
Basophils Relative: 0.4 % (ref 0.0–3.0)
Eosinophils Absolute: 0.2 10*3/uL (ref 0.0–0.7)
Eosinophils Relative: 1.3 % (ref 0.0–5.0)
HCT: 37.4 % — ABNORMAL LOW (ref 39.0–52.0)
Hemoglobin: 12.7 g/dL — ABNORMAL LOW (ref 13.0–17.0)
Lymphocytes Relative: 10.9 % — ABNORMAL LOW (ref 12.0–46.0)
Lymphs Abs: 1.5 10*3/uL (ref 0.7–4.0)
MCHC: 33.9 g/dL (ref 30.0–36.0)
MCV: 108.9 fL — ABNORMAL HIGH (ref 78.0–100.0)
Monocytes Absolute: 0.9 10*3/uL (ref 0.1–1.0)
Monocytes Relative: 6.6 % (ref 3.0–12.0)
Neutro Abs: 10.8 10*3/uL — ABNORMAL HIGH (ref 1.4–7.7)
Neutrophils Relative %: 80.8 % — ABNORMAL HIGH (ref 43.0–77.0)
Platelets: 203 10*3/uL (ref 150.0–400.0)
RBC: 3.43 Mil/uL — ABNORMAL LOW (ref 4.22–5.81)
RDW: 18.2 % — ABNORMAL HIGH (ref 11.5–15.5)
WBC: 13.4 10*3/uL — ABNORMAL HIGH (ref 4.0–10.5)

## 2023-07-06 LAB — COMPREHENSIVE METABOLIC PANEL
ALT: 14 U/L (ref 0–53)
AST: 17 U/L (ref 0–37)
Albumin: 3.9 g/dL (ref 3.5–5.2)
Alkaline Phosphatase: 59 U/L (ref 39–117)
BUN: 16 mg/dL (ref 6–23)
CO2: 27 meq/L (ref 19–32)
Calcium: 9.2 mg/dL (ref 8.4–10.5)
Chloride: 104 meq/L (ref 96–112)
Creatinine, Ser: 0.93 mg/dL (ref 0.40–1.50)
GFR: 74.47 mL/min (ref >60.00)
Glucose, Bld: 114 mg/dL — ABNORMAL HIGH (ref 70–99)
Potassium: 4.4 meq/L (ref 3.5–5.1)
Sodium: 139 meq/L (ref 135–145)
Total Bilirubin: 1.3 mg/dL — ABNORMAL HIGH (ref 0.2–1.2)
Total Protein: 6.2 g/dL (ref 6.0–8.3)

## 2023-08-01 ENCOUNTER — Other Ambulatory Visit: Payer: Self-pay | Admitting: Cardiovascular Disease

## 2023-09-07 ENCOUNTER — Encounter (HOSPITAL_COMMUNITY): Payer: Self-pay | Admitting: Internal Medicine

## 2023-09-07 ENCOUNTER — Emergency Department (HOSPITAL_COMMUNITY): Payer: Medicare HMO

## 2023-09-07 ENCOUNTER — Inpatient Hospital Stay (HOSPITAL_COMMUNITY)
Admission: EM | Admit: 2023-09-07 | Discharge: 2023-09-11 | DRG: 193 | Disposition: A | Discharge status: Home / Self Care | Payer: Medicare HMO | Attending: Internal Medicine | Admitting: Internal Medicine

## 2023-09-07 ENCOUNTER — Other Ambulatory Visit: Payer: Self-pay

## 2023-09-07 DIAGNOSIS — J9811 Atelectasis: Secondary | ICD-10-CM | POA: Diagnosis present

## 2023-09-07 DIAGNOSIS — J9601 Acute respiratory failure with hypoxia: Secondary | ICD-10-CM | POA: Diagnosis present

## 2023-09-07 DIAGNOSIS — D696 Thrombocytopenia, unspecified: Secondary | ICD-10-CM | POA: Diagnosis present

## 2023-09-07 DIAGNOSIS — I2510 Atherosclerotic heart disease of native coronary artery without angina pectoris: Secondary | ICD-10-CM | POA: Diagnosis present

## 2023-09-07 DIAGNOSIS — J84112 Idiopathic pulmonary fibrosis: Secondary | ICD-10-CM | POA: Diagnosis present

## 2023-09-07 DIAGNOSIS — J101 Influenza due to other identified influenza virus with other respiratory manifestations: Secondary | ICD-10-CM | POA: Diagnosis not present

## 2023-09-07 DIAGNOSIS — I110 Hypertensive heart disease with heart failure: Secondary | ICD-10-CM | POA: Diagnosis present

## 2023-09-07 DIAGNOSIS — M353 Polymyalgia rheumatica: Secondary | ICD-10-CM | POA: Diagnosis present

## 2023-09-07 DIAGNOSIS — Z79624 Long term (current) use of inhibitors of nucleotide synthesis: Secondary | ICD-10-CM

## 2023-09-07 DIAGNOSIS — Z833 Family history of diabetes mellitus: Secondary | ICD-10-CM

## 2023-09-07 DIAGNOSIS — I252 Old myocardial infarction: Secondary | ICD-10-CM | POA: Diagnosis not present

## 2023-09-07 DIAGNOSIS — I5032 Chronic diastolic (congestive) heart failure: Secondary | ICD-10-CM | POA: Diagnosis present

## 2023-09-07 DIAGNOSIS — M0600 Rheumatoid arthritis without rheumatoid factor, unspecified site: Secondary | ICD-10-CM | POA: Diagnosis present

## 2023-09-07 DIAGNOSIS — I5022 Chronic systolic (congestive) heart failure: Secondary | ICD-10-CM | POA: Diagnosis not present

## 2023-09-07 DIAGNOSIS — Z7952 Long term (current) use of systemic steroids: Secondary | ICD-10-CM | POA: Diagnosis not present

## 2023-09-07 DIAGNOSIS — E785 Hyperlipidemia, unspecified: Secondary | ICD-10-CM | POA: Diagnosis present

## 2023-09-07 DIAGNOSIS — M069 Rheumatoid arthritis, unspecified: Secondary | ICD-10-CM

## 2023-09-07 DIAGNOSIS — E872 Acidosis, unspecified: Secondary | ICD-10-CM | POA: Diagnosis present

## 2023-09-07 DIAGNOSIS — N179 Acute kidney failure, unspecified: Secondary | ICD-10-CM

## 2023-09-07 DIAGNOSIS — J13 Pneumonia due to Streptococcus pneumoniae: Secondary | ICD-10-CM | POA: Diagnosis present

## 2023-09-07 DIAGNOSIS — D539 Nutritional anemia, unspecified: Secondary | ICD-10-CM | POA: Diagnosis present

## 2023-09-07 DIAGNOSIS — J849 Interstitial pulmonary disease, unspecified: Secondary | ICD-10-CM | POA: Diagnosis not present

## 2023-09-07 DIAGNOSIS — K7460 Unspecified cirrhosis of liver: Secondary | ICD-10-CM | POA: Diagnosis present

## 2023-09-07 DIAGNOSIS — J189 Pneumonia, unspecified organism: Secondary | ICD-10-CM | POA: Diagnosis present

## 2023-09-07 DIAGNOSIS — Z7951 Long term (current) use of inhaled steroids: Secondary | ICD-10-CM

## 2023-09-07 DIAGNOSIS — J159 Unspecified bacterial pneumonia: Secondary | ICD-10-CM | POA: Diagnosis present

## 2023-09-07 DIAGNOSIS — J1000 Influenza due to other identified influenza virus with unspecified type of pneumonia: Secondary | ICD-10-CM | POA: Diagnosis present

## 2023-09-07 DIAGNOSIS — D84821 Immunodeficiency due to drugs: Secondary | ICD-10-CM | POA: Diagnosis present

## 2023-09-07 DIAGNOSIS — Z8616 Personal history of COVID-19: Secondary | ICD-10-CM

## 2023-09-07 DIAGNOSIS — Z7982 Long term (current) use of aspirin: Secondary | ICD-10-CM | POA: Diagnosis not present

## 2023-09-07 DIAGNOSIS — A419 Sepsis, unspecified organism: Secondary | ICD-10-CM

## 2023-09-07 DIAGNOSIS — I509 Heart failure, unspecified: Secondary | ICD-10-CM

## 2023-09-07 DIAGNOSIS — Z888 Allergy status to other drugs, medicaments and biological substances status: Secondary | ICD-10-CM

## 2023-09-07 DIAGNOSIS — J111 Influenza due to unidentified influenza virus with other respiratory manifestations: Principal | ICD-10-CM

## 2023-09-07 LAB — CBC WITH DIFFERENTIAL/PLATELET
Abs Immature Granulocytes: 0.11 10*3/uL — ABNORMAL HIGH (ref 0.00–0.07)
Basophils Absolute: 0 10*3/uL (ref 0.0–0.1)
Basophils Relative: 0 %
Eosinophils Absolute: 0 10*3/uL (ref 0.0–0.5)
Eosinophils Relative: 0 %
HCT: 40.6 % (ref 39.0–52.0)
Hemoglobin: 13.1 g/dL (ref 13.0–17.0)
Immature Granulocytes: 1 %
Lymphocytes Relative: 5 %
Lymphs Abs: 0.7 10*3/uL (ref 0.7–4.0)
MCH: 35.2 pg — ABNORMAL HIGH (ref 26.0–34.0)
MCHC: 32.3 g/dL (ref 30.0–36.0)
MCV: 109.1 fL — ABNORMAL HIGH (ref 80.0–100.0)
Monocytes Absolute: 0.5 10*3/uL (ref 0.1–1.0)
Monocytes Relative: 3 %
Neutro Abs: 13.2 10*3/uL — ABNORMAL HIGH (ref 1.7–7.7)
Neutrophils Relative %: 91 %
Platelets: 149 10*3/uL — ABNORMAL LOW (ref 150–400)
RBC: 3.72 MIL/uL — ABNORMAL LOW (ref 4.22–5.81)
RDW: 17.6 % — ABNORMAL HIGH (ref 11.5–15.5)
WBC: 14.4 10*3/uL — ABNORMAL HIGH (ref 4.0–10.5)
nRBC: 0 % (ref 0.0–0.2)

## 2023-09-07 LAB — COMPREHENSIVE METABOLIC PANEL
ALT: 15 U/L (ref 0–44)
AST: 24 U/L (ref 15–41)
Albumin: 3.4 g/dL — ABNORMAL LOW (ref 3.5–5.0)
Alkaline Phosphatase: 42 U/L (ref 38–126)
Anion gap: 14 (ref 5–15)
BUN: 41 mg/dL — ABNORMAL HIGH (ref 8–23)
CO2: 25 mmol/L (ref 22–32)
Calcium: 8.5 mg/dL — ABNORMAL LOW (ref 8.9–10.3)
Chloride: 99 mmol/L (ref 98–111)
Creatinine, Ser: 1.56 mg/dL — ABNORMAL HIGH (ref 0.61–1.24)
GFR, Estimated: 43 mL/min — ABNORMAL LOW (ref >60)
Glucose, Bld: 171 mg/dL — ABNORMAL HIGH (ref 70–99)
Potassium: 3.9 mmol/L (ref 3.5–5.1)
Sodium: 138 mmol/L (ref 135–145)
Total Bilirubin: 1.4 mg/dL — ABNORMAL HIGH (ref 0.0–1.2)
Total Protein: 6.5 g/dL (ref 6.5–8.1)

## 2023-09-07 LAB — I-STAT CG4 LACTIC ACID, ED
Lactic Acid, Venous: 2.6 mmol/L (ref 0.5–1.9)
Lactic Acid, Venous: 1.9 mmol/L (ref 0.5–1.9)

## 2023-09-07 LAB — CBC
HCT: 37.6 % — ABNORMAL LOW (ref 39.0–52.0)
Hemoglobin: 12 g/dL — ABNORMAL LOW (ref 13.0–17.0)
MCH: 35.1 pg — ABNORMAL HIGH (ref 26.0–34.0)
MCHC: 31.9 g/dL (ref 30.0–36.0)
MCV: 109.9 fL — ABNORMAL HIGH (ref 80.0–100.0)
Platelets: 132 10*3/uL — ABNORMAL LOW (ref 150–400)
RBC: 3.42 MIL/uL — ABNORMAL LOW (ref 4.22–5.81)
RDW: 17.6 % — ABNORMAL HIGH (ref 11.5–15.5)
WBC: 15.7 10*3/uL — ABNORMAL HIGH (ref 4.0–10.5)
nRBC: 0 % (ref 0.0–0.2)

## 2023-09-07 LAB — BLOOD GAS, VENOUS
Acid-Base Excess: 6.2 mmol/L — ABNORMAL HIGH (ref 0.0–2.0)
Bicarbonate: 30.6 mmol/L — ABNORMAL HIGH (ref 20.0–28.0)
O2 Saturation: 30.5 %
Patient temperature: 37
pCO2, Ven: 42 mm[Hg] — ABNORMAL LOW (ref 44–60)
pH, Ven: 7.47 — ABNORMAL HIGH (ref 7.25–7.43)
pO2, Ven: <31 mm[Hg] — CL (ref 32–45)

## 2023-09-07 LAB — CREATININE, SERUM
Creatinine, Ser: 1.63 mg/dL — ABNORMAL HIGH (ref 0.61–1.24)
GFR, Estimated: 41 mL/min — ABNORMAL LOW (ref >60)

## 2023-09-07 LAB — APTT: aPTT: 30 s (ref 24–36)

## 2023-09-07 LAB — PROTIME-INR
INR: 1.1 (ref 0.8–1.2)
Prothrombin Time: 14.6 s (ref 11.4–15.2)

## 2023-09-07 LAB — SARS CORONAVIRUS 2 BY RT PCR: SARS Coronavirus 2 by RT PCR: NEGATIVE

## 2023-09-07 MED ORDER — BRIMONIDINE TARTRATE 0.2 % OP SOLN
1.0000 [drp] | Freq: Two times a day (BID) | OPHTHALMIC | Status: DC
  Administered 2023-09-07 – 2023-09-11 (×8): 1 [drp] via OPHTHALMIC
  Filled 2023-09-07: qty 5

## 2023-09-07 MED ORDER — POLYVINYL ALCOHOL 1.4 % OP SOLN
1.0000 [drp] | Freq: Two times a day (BID) | OPHTHALMIC | Status: DC
  Administered 2023-09-07 – 2023-09-11 (×8): 1 [drp] via OPHTHALMIC
  Filled 2023-09-07: qty 15

## 2023-09-07 MED ORDER — IPRATROPIUM BROMIDE 0.02 % IN SOLN
0.5000 mg | Freq: Once | RESPIRATORY_TRACT | Status: AC
  Administered 2023-09-07: 0.5 mg via RESPIRATORY_TRACT
  Filled 2023-09-07: qty 2.5

## 2023-09-07 MED ORDER — ALBUTEROL SULFATE (2.5 MG/3ML) 0.083% IN NEBU
2.5000 mg | INHALATION_SOLUTION | RESPIRATORY_TRACT | Status: DC | PRN
  Administered 2023-09-08: 2.5 mg via RESPIRATORY_TRACT
  Filled 2023-09-07: qty 3

## 2023-09-07 MED ORDER — VITAMIN D 25 MCG (1000 UNIT) PO TABS
1000.0000 [IU] | ORAL_TABLET | Freq: Every day | ORAL | Status: DC
  Administered 2023-09-08 – 2023-09-11 (×4): 1000 [IU] via ORAL
  Filled 2023-09-07 (×4): qty 1

## 2023-09-07 MED ORDER — ATORVASTATIN CALCIUM 40 MG PO TABS
40.0000 mg | ORAL_TABLET | Freq: Every day | ORAL | Status: DC
  Administered 2023-09-07 – 2023-09-10 (×4): 40 mg via ORAL
  Filled 2023-09-07 (×4): qty 1

## 2023-09-07 MED ORDER — MOMETASONE FURO-FORMOTEROL FUM 200-5 MCG/ACT IN AERO
2.0000 | INHALATION_SPRAY | Freq: Two times a day (BID) | RESPIRATORY_TRACT | Status: DC
  Administered 2023-09-07 – 2023-09-08 (×2): 2 via RESPIRATORY_TRACT
  Filled 2023-09-07: qty 8.8

## 2023-09-07 MED ORDER — GUAIFENESIN-DM 100-10 MG/5ML PO SYRP
5.0000 mL | ORAL_SOLUTION | ORAL | Status: DC | PRN
  Administered 2023-09-07 – 2023-09-10 (×6): 5 mL via ORAL
  Filled 2023-09-07 (×6): qty 5

## 2023-09-07 MED ORDER — LATANOPROST 0.005 % OP SOLN
1.0000 [drp] | Freq: Every day | OPHTHALMIC | Status: DC
  Administered 2023-09-07 – 2023-09-10 (×4): 1 [drp] via OPHTHALMIC
  Filled 2023-09-07: qty 2.5

## 2023-09-07 MED ORDER — METRONIDAZOLE 0.75 % EX CREA
1.0000 | TOPICAL_CREAM | Freq: Two times a day (BID) | CUTANEOUS | Status: DC | PRN
Start: 2023-09-07 — End: 2023-09-11

## 2023-09-07 MED ORDER — IPRATROPIUM-ALBUTEROL 0.5-2.5 (3) MG/3ML IN SOLN
3.0000 mL | RESPIRATORY_TRACT | Status: AC
  Administered 2023-09-07 – 2023-09-08 (×4): 3 mL via RESPIRATORY_TRACT
  Filled 2023-09-07 (×3): qty 3

## 2023-09-07 MED ORDER — SALINE SPRAY 0.65 % NA SOLN: 1.0000 | NASAL | Status: DC | PRN

## 2023-09-07 MED ORDER — SODIUM CHLORIDE 0.9 % IV SOLN
500.0000 mg | INTRAVENOUS | Status: AC
  Administered 2023-09-08 – 2023-09-10 (×3): 500 mg via INTRAVENOUS
  Filled 2023-09-07 (×3): qty 5

## 2023-09-07 MED ORDER — ENOXAPARIN SODIUM 40 MG/0.4ML IJ SOSY
40.0000 mg | PREFILLED_SYRINGE | INTRAMUSCULAR | Status: DC
  Administered 2023-09-08 – 2023-09-11 (×4): 40 mg via SUBCUTANEOUS
  Filled 2023-09-07 (×4): qty 0.4

## 2023-09-07 MED ORDER — LORATADINE 10 MG PO TABS
10.0000 mg | ORAL_TABLET | Freq: Every day | ORAL | Status: DC
  Administered 2023-09-08 – 2023-09-11 (×4): 10 mg via ORAL
  Filled 2023-09-07 (×4): qty 1

## 2023-09-07 MED ORDER — SODIUM CHLORIDE 0.9 % IV SOLN
2.0000 g | INTRAVENOUS | Status: DC
  Administered 2023-09-08 – 2023-09-11 (×4): 2 g via INTRAVENOUS
  Filled 2023-09-07 (×4): qty 20

## 2023-09-07 MED ORDER — SODIUM CHLORIDE 0.9 % IV SOLN
500.0000 mg | Freq: Once | INTRAVENOUS | Status: AC
  Administered 2023-09-07: 500 mg via INTRAVENOUS
  Filled 2023-09-07: qty 5

## 2023-09-07 MED ORDER — GLUCOSAMINE-CHONDROITIN 500-400 MG PO TABS: 1.0000 | ORAL_TABLET | Freq: Every day | ORAL | Status: DC

## 2023-09-07 MED ORDER — POLYETHYLENE GLYCOL 3350 17 G PO PACK: 17.0000 g | PACK | Freq: Every day | ORAL | Status: DC | PRN

## 2023-09-07 MED ORDER — METHYLPREDNISOLONE SODIUM SUCC 125 MG IJ SOLR
125.0000 mg | Freq: Once | INTRAMUSCULAR | Status: AC
  Administered 2023-09-07: 125 mg via INTRAVENOUS
  Filled 2023-09-07: qty 2

## 2023-09-07 MED ORDER — PREDNISONE 5 MG PO TABS
5.0000 mg | ORAL_TABLET | Freq: Every day | ORAL | Status: DC
  Administered 2023-09-08: 5 mg via ORAL
  Filled 2023-09-07: qty 1

## 2023-09-07 MED ORDER — PREDNISONE 1 MG PO TABS
1.0000 mg | ORAL_TABLET | Freq: Every day | ORAL | Status: DC
  Administered 2023-09-07: 1 mg via ORAL
  Filled 2023-09-07: qty 1

## 2023-09-07 MED ORDER — OSELTAMIVIR PHOSPHATE 30 MG PO CAPS
30.0000 mg | ORAL_CAPSULE | Freq: Two times a day (BID) | ORAL | Status: DC
  Administered 2023-09-07 – 2023-09-11 (×8): 30 mg via ORAL
  Filled 2023-09-07 (×9): qty 1

## 2023-09-07 MED ORDER — ACETAMINOPHEN 650 MG RE SUPP: 650.0000 mg | Freq: Four times a day (QID) | RECTAL | Status: DC | PRN

## 2023-09-07 MED ORDER — POLYETHYL GLYCOL-PROPYL GLYCOL 0.4-0.3 % OP SOLN
1.0000 [drp] | Freq: Two times a day (BID) | OPHTHALMIC | Status: DC
Start: 2023-09-07 — End: 2023-09-07

## 2023-09-07 MED ORDER — SODIUM CHLORIDE 0.9% FLUSH
3.0000 mL | Freq: Two times a day (BID) | INTRAVENOUS | Status: DC
  Administered 2023-09-07 – 2023-09-11 (×8): 3 mL via INTRAVENOUS

## 2023-09-07 MED ORDER — OMEGA-3 FATTY ACIDS 1000 MG PO CAPS: 1.0000 g | ORAL_CAPSULE | Freq: Two times a day (BID) | ORAL | Status: DC

## 2023-09-07 MED ORDER — SODIUM CHLORIDE 0.9 % IV SOLN
2.0000 g | Freq: Once | INTRAVENOUS | Status: AC
  Administered 2023-09-07: 2 g via INTRAVENOUS
  Filled 2023-09-07: qty 20

## 2023-09-07 MED ORDER — CARVEDILOL 3.125 MG PO TABS
3.1250 mg | ORAL_TABLET | Freq: Two times a day (BID) | ORAL | Status: DC
Start: 2023-09-07 — End: 2023-09-11
  Administered 2023-09-07 – 2023-09-11 (×8): 3.125 mg via ORAL
  Filled 2023-09-07 (×8): qty 1

## 2023-09-07 MED ORDER — LACTATED RINGERS IV SOLN: INTRAVENOUS | Status: DC

## 2023-09-07 MED ORDER — ALBUTEROL SULFATE HFA 108 (90 BASE) MCG/ACT IN AERS: 2.0000 | INHALATION_SPRAY | RESPIRATORY_TRACT | Status: DC | PRN

## 2023-09-07 MED ORDER — LACTATED RINGERS IV BOLUS (SEPSIS)
500.0000 mL | Freq: Once | INTRAVENOUS | Status: AC
  Administered 2023-09-07: 500 mL via INTRAVENOUS

## 2023-09-07 MED ORDER — HYDROCORTISONE SOD SUC (PF) 100 MG IJ SOLR: 50.0000 mg | Freq: Once | INTRAMUSCULAR | Status: DC

## 2023-09-07 MED ORDER — ALBUTEROL SULFATE (2.5 MG/3ML) 0.083% IN NEBU
5.0000 mg | INHALATION_SOLUTION | Freq: Once | RESPIRATORY_TRACT | Status: AC
  Administered 2023-09-07: 5 mg via RESPIRATORY_TRACT
  Filled 2023-09-07: qty 6

## 2023-09-07 MED ORDER — OSELTAMIVIR PHOSPHATE 75 MG PO CAPS
75.0000 mg | ORAL_CAPSULE | Freq: Two times a day (BID) | ORAL | Status: DC
  Filled 2023-09-07: qty 1

## 2023-09-07 MED ORDER — ACETAMINOPHEN 325 MG PO TABS
650.0000 mg | ORAL_TABLET | Freq: Four times a day (QID) | ORAL | Status: DC | PRN
  Administered 2023-09-07 – 2023-09-11 (×7): 650 mg via ORAL
  Filled 2023-09-07 (×7): qty 2

## 2023-09-07 MED ORDER — SODIUM CHLORIDE 0.65 % NA SOLN: 1.0000 | NASAL | Status: DC | PRN

## 2023-09-07 MED ORDER — OMEGA-3-ACID ETHYL ESTERS 1 G PO CAPS
1.0000 g | ORAL_CAPSULE | Freq: Two times a day (BID) | ORAL | Status: DC
  Administered 2023-09-07 – 2023-09-11 (×8): 1 g via ORAL
  Filled 2023-09-07 (×9): qty 1

## 2023-09-07 MED ORDER — MINOCYCLINE HCL 50 MG PO CAPS
50.0000 mg | ORAL_CAPSULE | Freq: Two times a day (BID) | ORAL | Status: DC
Start: 2023-09-07 — End: 2023-09-11
  Administered 2023-09-07 – 2023-09-11 (×8): 50 mg via ORAL
  Filled 2023-09-07 (×10): qty 1

## 2023-09-07 MED ORDER — ASPIRIN 81 MG PO TBEC
81.0000 mg | DELAYED_RELEASE_TABLET | Freq: Every evening | ORAL | Status: DC
  Administered 2023-09-07 – 2023-09-10 (×4): 81 mg via ORAL
  Filled 2023-09-07 (×4): qty 1

## 2023-09-07 NOTE — Assessment & Plan Note (Addendum)
Patient baseline creatinine seems to be normal, currently creatinine 1.56.  This is in the setting of recent vomiting and diarrhea which has since then resolved.  Also noted hypoxia.  Patient has already received half liter fluid bolus.  At this time patient does have some ankle edema bilaterally.  And also in fair amount of tachypnea.  I will hold off on further fluids.  I think we will trend the creatinine.  Check UA lites and sodium. Lactic acidois resolved

## 2023-09-07 NOTE — Sepsis Progress Note (Signed)
 Sepsis protocol monitored by [employer] ?

## 2023-09-07 NOTE — Assessment & Plan Note (Addendum)
Chest x-ray finding of minimal bibasilar segmental atelectasis versus infiltrates.  Associated with new hypoxemia requiring between 2 to 4 L/min of supplementary oxygen (new).  Leukocytosis as well as minimal lactic acidosis.  2.6.  Patient felt to have sepsis on presentation.  S/p half a liter of fluid.  Lactic acidosis resolved.  Patient not looking toxic.  However still tachypneic.  Patient is influenza A positive.  Will treat with Tamiflu.  Started empirically on ceftriaxone and azithromycin in the ER which will be continued. Check covid 19 PCR  Patient has chronic interstitial lung disease.  Noted to have some wheezing on exam, but a lot of it seems to be upper airway wheezing.  Already received Solu-Medrol IV.  I will defer further steroid therapy for this indication at this time.  I will order bronchodilator therapy.  As well as Dulera.

## 2023-09-07 NOTE — Assessment & Plan Note (Addendum)
C/w chronic steroid. Hold cellcept due to active infection. Patient already got methylprednisolon 125 in the ER. Will monitor bp/need for further stress dosing.

## 2023-09-07 NOTE — ED Triage Notes (Signed)
BIBA from PCP office -O2 sat was 84% on room air. Pt has had cough, flu like s/s for one week, positive for flu. Duoneb given PTA. Hx of CHF, COPD. 122/56 BP 108 HR 94% room air

## 2023-09-07 NOTE — Assessment & Plan Note (Signed)
Given ankle edema, seems slightly fluid overloaded.  However due to critical state in terms of hypoxemia, tachypnea, AKI and soft blood pressure will defer diuresis for this evening patient takes torsemide 1 tablet every day..  I will reduce patient's Coreg to 3.125.  Continue with aspirin and statin

## 2023-09-07 NOTE — ED Notes (Signed)
ED TO INPATIENT HANDOFF REPORT  ED Nurse Name and Phone #: Pennelope Bracken EMTP  S Name/Age/Gender Jeffrey Stewart 89 y.o. male Room/Bed: WA02/WA02  Code Status   Code Status: Full Code  Home/SNF/Other Home Patient oriented to: self, place, time, and situation Is this baseline? Yes   Triage Complete: Triage complete  Chief Complaint Pneumonia [J18.9]  Triage Note BIBA from PCP office -O2 sat was 84% on room air. Pt has had cough, flu like s/s for one week, positive for flu. Duoneb given PTA. Hx of CHF, COPD. 122/56 BP 108 HR 94% room air    Allergies Allergies  Allergen Reactions   Niaspan [Niacin] Other (See Comments)    Intolerance  Flush feeling WHEN ON LARGE DOSE - IS ABLE TO TAKE SMALLER DOSAGE WITHOUT PROBLEM   Meloxicam     Disorientation     Level of Care/Admitting Diagnosis ED Disposition     ED Disposition  Admit   Condition  --   Comment  Hospital Area: [facility] [[id number]]  Level of Care: Telemetry [5]  Admit to tele based on following criteria: Eval of Syncope  May admit patient to [facility] or [facility] if equivalent level of care is available:: No  Covid Evaluation: Symptomatic Person Under Investigation (PUI) or recent exposure (last 10 days) *Testing Required*  Diagnosis: Pneumonia [227785]  Admitting Physician: Nolberto Hanlon [[id number]]  Attending Physician: Nolberto Hanlon [[id number]]  Certification:: I certify this patient will need inpatient services for at least 2 midnights  Expected Medical Readiness: 09/09/2023          B Medical/Surgery History Past Medical History:  Diagnosis Date   Allergy    Arthritis    MINOR ARTHRITIS FEET AND TOES   Asthma    Cataract    CHF (congestive heart failure) (HCC) 11/2009   EF45-50%   Coronary artery disease    Hyperlipidemia    Myocardial infarction (HCC)    Pneumonia    HX OF PNEUMONIA 4 OR 5 YRS AGO   Rosacea    OF FACE   Shortness of breath    SOMETIMES SOB OR WHEEZING WITH  EXERTION   Past Surgical History:  Procedure Laterality Date   APPENDECTOMY     AGE 66   CARDIAC CATHETERIZATION  11/2009   mild / mod cad,MODERATE TIGHT STENOSIS IN THE DISTAL LEFT CIRCUMFLEX ARTERY   ELECTROCARDIOGRAM  11/2009   RBBB   EYE SURGERY     BILATERAL CATARACT EXTRACT EXTRACTION WITH IMPLANTS   INGUINAL HERNIA REPAIR Bilateral 09/29/2013   Procedure: LAPAROSCOPIC BILATERAL  INGUINAL HERNIA;  Surgeon: Ernestene Mention, MD;  Location: [facility];  Service: General;  Laterality: Bilateral;   INSERTION OF MESH Bilateral 09/29/2013   Procedure: INSERTION OF MESH;  Surgeon: Ernestene Mention, MD;  Location: [facility];  Service: General;  Laterality: Bilateral;   last echo 02/2009     last nuc 2009  12/16/2007   EF 33%. ABNORMAL. HE HAS APICAL DEFECT CONSISTENT WITH A SCAR.LV FUNCTION MODERATELY  DEPRESSED   US ECHOCARDIOGRAPHY  02/23/2009   EF 45-50%   US ECHOCARDIOGRAPHY  12/27/2007   EF 45-50%     A IV Location/Drains/Wounds Patient Lines/Drains/Airways Status     Active Line/Drains/Airways     Name Placement date Placement time Site Days   Peripheral IV 09/07/23 18 G Left Antecubital 09/07/23  1202  Antecubital  less than 1   Peripheral IV 09/07/23 20 G 1" Anterior;Distal;Right;Upper Arm 09/07/23  1244  Arm  less than 1   Incision - 4 Ports Abdomen 1: Umbilicus 2: Mid 3: Right 4: Left 09/29/13  --  -- 3630            Intake/Output Last 24 hours  Intake/Output Summary (Last 24 hours) at 09/07/2023 1730 Last data filed at 09/07/2023 1518 Gross per 24 hour  Intake 246.29 ml  Output --  Net 246.29 ml    Labs/Imaging Results for orders placed or performed during the hospital encounter of 09/07/23 (from the past 48 hours)  Blood gas, venous (at [facility] and [facility])     Status: Abnormal   Collection Time: 09/07/23 12:49 PM  Result Value Ref Range   pH, Ven 7.47 (H) 7.25 - 7.43   pCO2, Ven 42 (L) 44 - 60 mmHg   pO2, Ven <31 (LL) 32 - 45 mmHg    Comment: CRITICAL RESULT CALLED TO, READ  BACK BY AND VERIFIED WITH: WAITES,L. RN AT 1315 09/07/23 MULLINS,T    Bicarbonate 30.6 (H) 20.0 - 28.0 mmol/L   Acid-Base Excess 6.2 (H) 0.0 - 2.0 mmol/L   O2 Saturation 30.5 %   Patient temperature 37.0     Comment: Performed at [facility], [address], [place], [place] [address]  Comprehensive metabolic panel     Status: Abnormal   Collection Time: 09/07/23 12:50 PM  Result Value Ref Range   Sodium 138 135 - 145 mmol/L   Potassium 3.9 3.5 - 5.1 mmol/L   Chloride 99 98 - 111 mmol/L   CO2 25 22 - 32 mmol/L   Glucose, Bld 171 (H) 70 - 99 mg/dL    Comment: Glucose reference range applies only to samples taken after fasting for at least 8 hours.   BUN 41 (H) 8 - 23 mg/dL   Creatinine, Ser 6.04 (H) 0.61 - 1.24 mg/dL   Calcium 8.5 (L) 8.9 - 10.3 mg/dL   Total Protein 6.5 6.5 - 8.1 g/dL   Albumin 3.4 (L) 3.5 - 5.0 g/dL   AST 24 15 - 41 U/L   ALT 15 0 - 44 U/L   Alkaline Phosphatase 42 38 - 126 U/L   Total Bilirubin 1.4 (H) 0.0 - 1.2 mg/dL   GFR, Estimated 43 (L) >60 mL/min    Comment: (NOTE) Calculated using the CKD-EPI Creatinine Equation (2021)    Anion gap 14 5 - 15    Comment: Performed at [facility], [address] [place], [place] [address]  CBC with Differential     Status: Abnormal   Collection Time: 09/07/23 12:50 PM  Result Value Ref Range   WBC 14.4 (H) 4.0 - 10.5 K/uL   RBC 3.72 (L) 4.22 - 5.81 MIL/uL   Hemoglobin 13.1 13.0 - 17.0 g/dL   HCT 11.9 14.7 - 82.9 %   MCV 109.1 (H) 80.0 - 100.0 fL   MCH 35.2 (H) 26.0 - 34.0 pg   MCHC 32.3 30.0 - 36.0 g/dL   RDW 56.2 (H) 13.0 - 86.5 %   Platelets 149 (L) 150 - 400 K/uL   nRBC 0.0 0.0 - 0.2 %   Neutrophils Relative % 91 %   Neutro Abs 13.2 (H) 1.7 - 7.7 K/uL   Lymphocytes Relative 5 %   Lymphs Abs 0.7 0.7 - 4.0 K/uL   Monocytes Relative 3 %   Monocytes Absolute 0.5 0.1 - 1.0 K/uL   Eosinophils Relative 0 %   Eosinophils Absolute 0.0 0.0 - 0.5 K/uL   Basophils  Relative 0  %   Basophils Absolute 0.0 0.0 - 0.1 K/uL   WBC Morphology Mild Left Shift (1-5% metas, occ myelo)    Smear Review PLATELET COUNT CONFIRMED BY SMEAR     Comment: Reviewed   Immature Granulocytes 1 %   Abs Immature Granulocytes 0.11 (H) 0.00 - 0.07 K/uL   Acanthocytes PRESENT     Comment: Performed at [facility], [address] [place], [place] [address]  Protime-INR     Status: None   Collection Time: 09/07/23 12:50 PM  Result Value Ref Range   Prothrombin Time 14.6 11.4 - 15.2 seconds   INR 1.1 0.8 - 1.2    Comment: (NOTE) INR goal varies based on device and disease states. Performed at [facility], [address] [place], [place] [address]   APTT     Status: None   Collection Time: 09/07/23 12:50 PM  Result Value Ref Range   aPTT 30 24 - 36 seconds    Comment: Performed at [facility] [address] [place], [place] [address]  I-Stat Lactic Acid, ED     Status: Abnormal   Collection Time: 09/07/23 12:51 PM  Result Value Ref Range   Lactic Acid, Venous 2.6 (HH) 0.5 - 1.9 mmol/L  I-Stat Lactic Acid, ED     Status: None   Collection Time: 09/07/23  2:32 PM  Result Value Ref Range   Lactic Acid, Venous 1.9 0.5 - 1.9 mmol/L   DG Chest Port 1 View Result Date: 09/07/2023 CLINICAL DATA:  Shortness of breath. EXAM: PORTABLE CHEST 1 VIEW COMPARISON:  June 19, 2022. FINDINGS: Stable cardiomediastinal silhouette. Minimal bibasilar subsegmental atelectasis or possibly infiltrates are noted. Bony thorax is unremarkable. IMPRESSION: Minimal bibasilar subsegmental atelectasis or possibly infiltrates. Electronically Signed   By: Lupita Stewart M.D.   On: 09/07/2023 14:07    Pending Labs Unresulted Labs (From admission, onward)     Start     Ordered   09/14/23 0500  Creatinine, serum  (enoxaparin (LOVENOX)    CrCl >/= 30 ml/min)  Weekly,   R     Comments: while on enoxaparin therapy    09/07/23 1525   09/08/23 0500   APTT  Tomorrow morning,   R        09/07/23 1525   09/08/23 0500  Protime-INR  Tomorrow morning,   R        09/07/23 1525   09/08/23 0500  Basic metabolic panel  Tomorrow morning,   R        09/07/23 1525   09/08/23 0500  CBC  Tomorrow morning,   R        09/07/23 1525   09/07/23 1525  CBC  (enoxaparin (LOVENOX)    CrCl >/= 30 ml/min)  Once,   R       Comments: Baseline for enoxaparin therapy IF NOT ALREADY DRAWN.  Notify MD if PLT < 100 K.    09/07/23 1525   09/07/23 1525  Creatinine, serum  (enoxaparin (LOVENOX)    CrCl >/= 30 ml/min)  Once,   R       Comments: Baseline for enoxaparin therapy IF NOT ALREADY DRAWN.    09/07/23 1525   09/07/23 1524  Legionella Pneumophila Serogp 1 Ur Ag  Once,   URGENT        09/07/23 1523   09/07/23 1524  Strep pneumoniae urinary antigen  Once,   URGENT  09/07/23 1523   09/07/23 1524  SARS Coronavirus 2 by RT PCR (hospital order, performed in [facility] hospital lab) *cepheid single result test* Anterior Nasal Swab  (Tier 2 - SARS Coronavirus 2 by RT PCR (hospital order, performed in [facility] hospital lab) *cepheid single result test*)  Once,   URGENT        09/07/23 1523   09/07/23 1523  Expectorated Sputum Assessment w Gram Stain, Rflx to Resp Cult  Once,   R        09/07/23 1522   09/07/23 1217  Blood Culture (routine x 2)  (Undifferentiated presentation (screening labs and basic nursing orders))  BLOOD CULTURE X 2,   STAT      09/07/23 1217            Vitals/Pain Today's Vitals   09/07/23 1200 09/07/23 1400 09/07/23 1620 09/07/23 1630  BP: 113/73 119/68    Pulse: (!) 110 (!) 109    Resp: 20 (!) 35    Temp:   98.3 F (36.8 C)   TempSrc:   Oral   SpO2: 100% 97%  93%  PainSc:   4      Isolation Precautions Airborne and Contact precautions  Medications Medications  lactated ringers infusion (has no administration in time range)  cefTRIAXone (ROCEPHIN) 2 g in sodium chloride 0.9 % 100 mL IVPB (has no administration in  time range)  azithromycin (ZITHROMAX) 500 mg in sodium chloride 0.9 % 250 mL IVPB (has no administration in time range)  oseltamivir (TAMIFLU) capsule 75 mg (has no administration in time range)  mometasone-formoterol (DULERA) 200-5 MCG/ACT inhaler 2 puff (has no administration in time range)  ipratropium-albuterol (DUONEB) 0.5-2.5 (3) MG/3ML nebulizer solution 3 mL (3 mLs Nebulization Given 09/07/23 1630)  enoxaparin (LOVENOX) injection 40 mg (has no administration in time range)  acetaminophen (TYLENOL) tablet 650 mg (650 mg Oral Given 09/07/23 1624)    Or  acetaminophen (TYLENOL) suppository 650 mg ( Rectal See Alternative 09/07/23 1624)  polyethylene glycol (MIRALAX / GLYCOLAX) packet 17 g (has no administration in time range)  sodium chloride flush (NS) 0.9 % injection 3 mL (has no administration in time range)  albuterol (PROVENTIL) (2.5 MG/3ML) 0.083% nebulizer solution 2.5 mg (has no administration in time range)  lactated ringers bolus 500 mL (0 mLs Intravenous Stopped 09/07/23 1324)  albuterol (PROVENTIL) (2.5 MG/3ML) 0.083% nebulizer solution 5 mg (5 mg Nebulization Given 09/07/23 1310)  ipratropium (ATROVENT) nebulizer solution 0.5 mg (0.5 mg Nebulization Given 09/07/23 1310)  methylPREDNISolone sodium succinate (SOLU-MEDROL) 125 mg/2 mL injection 125 mg (125 mg Intravenous Given 09/07/23 1311)  cefTRIAXone (ROCEPHIN) 2 g in sodium chloride 0.9 % 100 mL IVPB (0 g Intravenous Stopped 09/07/23 1419)  azithromycin (ZITHROMAX) 500 mg in sodium chloride 0.9 % 250 mL IVPB (0 mg Intravenous Stopped 09/07/23 1518)    Mobility walks with device Gilmer Mor)     Focused Assessments See Chart   R Recommendations: See Admitting Provider Note  Report given to:

## 2023-09-07 NOTE — H&P (Signed)
History and Physical    Patient: Jeffrey Stewart:[id number] DOB: Jul 23, 1934 DOA: 09/07/2023 DOS: the patient was seen and examined on 09/07/2023 PCP: Fredrich Romans, FNP  Patient coming from: Home  Chief Complaint:  Chief Complaint  Patient presents with   Shortness of Breath   Influenza   HPI: Jeffrey Stewart is a 89 y.o. male with medical history significant of interstitial lung disease, abnormal CT scan , covid 19 in 2022 . , rheumatology for polymyalgia rheumatica and seronegative rheumatoid arthritis. He is on chronic prednisone at 6 mg from rheumatology   History is principally obtained from the patient as well as from the ER signout.  Patient generally has some sensation of shortness of breath with exertion due to his chronic interstitial lung disease and generally has some dry cough.  Patient reports a new onset of several episodes of vomiting and diarrhea nonbloody that started about 7 days ago and actually resolved more than 3 days ago - currently no nasuea or vomiting at this time..  Patient has had no nausea or vomiting since then.  Patient denies any fever or abdominal pain.  However over the last 48 hours patient reports worsening cough, scant yellow sputum as well as markedly worsening shortness of breath that was especially bad today.  Present even at rest. Again no fever or chest pain.  No palpitation or fall.  Patient actually was evaluated at primary care office today for above symptoms.  Patient SpO2 was 84% on room air.  Patient typically does not use supplementary oxygen at home patient was transferred to [facility] long ER.  Flu testing was done in the office which was positive for influenza.  ER workup is corroborating patient's hypoxia.  Currently on 4 L/min of supplementary oxygen.  Still noted to be tachypneic around 30/min.  Patient overall feeling better.  Patient has received Solu-Medrol in the ER as well as ceftriaxone and azithromycin.  As well as bronchodilator  therapy.    Review of Systems: As mentioned in the history of present illness. All other systems reviewed and are negative. Past Medical History:  Diagnosis Date   Allergy    Arthritis    MINOR ARTHRITIS FEET AND TOES   Asthma    Cataract    CHF (congestive heart failure) (HCC) 11/2009   EF45-50%   Coronary artery disease    Hyperlipidemia    Myocardial infarction (HCC)    Pneumonia    HX OF PNEUMONIA 4 OR 5 YRS AGO   Rosacea    OF FACE   Shortness of breath    SOMETIMES SOB OR WHEEZING WITH EXERTION   Past Surgical History:  Procedure Laterality Date   APPENDECTOMY     AGE 89   CARDIAC CATHETERIZATION  11/2009   mild / mod cad,MODERATE TIGHT STENOSIS IN THE DISTAL LEFT CIRCUMFLEX ARTERY   ELECTROCARDIOGRAM  11/2009   RBBB   EYE SURGERY     BILATERAL CATARACT EXTRACT EXTRACTION WITH IMPLANTS   INGUINAL HERNIA REPAIR Bilateral 09/29/2013   Procedure: LAPAROSCOPIC BILATERAL  INGUINAL HERNIA;  Surgeon: Ernestene Mention, MD;  Location: [facility];  Service: General;  Laterality: Bilateral;   INSERTION OF MESH Bilateral 09/29/2013   Procedure: INSERTION OF MESH;  Surgeon: Ernestene Mention, MD;  Location: [facility];  Service: General;  Laterality: Bilateral;   last echo 02/2009     last nuc 2009  12/16/2007   EF 33%. ABNORMAL. HE HAS APICAL DEFECT CONSISTENT WITH A SCAR.LV FUNCTION MODERATELY  DEPRESSED   US ECHOCARDIOGRAPHY  02/23/2009   EF 45-50%   US ECHOCARDIOGRAPHY  12/27/2007   EF 45-50%   Social History:  reports that he has never smoked. He has never used smokeless tobacco. He reports that he does not currently use alcohol. He reports that he does not use drugs.  Allergies  Allergen Reactions   Niaspan [Niacin]     Intolerance  Flush feeling WHEN ON LARGE DOSE - IS ABLE TO TAKE SMALLER DOSAGE WITHOUT PROB   Meloxicam     Disorientation     Family History  Problem Relation Age of Onset   Diabetes Sister    Lung cancer Sister        smoker    Prior to Admission  medications   Medication Sig Start Date End Date Taking? Authorizing Provider  acetaminophen (TYLENOL) 500 MG tablet Take 1,000 mg by mouth every 6 (six) hours as needed for moderate pain.   Yes [provider]  albuterol (VENTOLIN HFA) 108 (90 Base) MCG/ACT inhaler Inhale 2 puffs into the lungs every 4 (four) hours as needed for wheezing or shortness of breath. 06/19/22  Yes Cathren Laine, MD  aspirin EC 81 MG tablet Take 81 mg by mouth every evening.   Yes [provider]  atorvastatin (LIPITOR) 40 MG tablet TAKE 1 TABLET AT BEDTIME 08/01/23  Yes Nahser, Deloris Ping, MD  brimonidine (ALPHAGAN) 0.2 % ophthalmic solution Place 1 drop into both eyes 2 (two) times daily. 1 drop 2 (two) times daily. 07/20/21  Yes [provider]  carvedilol (COREG) 6.25 MG tablet TAKE 1 TABLET BY MOUTH TWICE A DAY 03/07/23  Yes Swinyer, Zachary George, NP  cetirizine (ZYRTEC) 10 MG tablet Take 10 mg by mouth at bedtime.   Yes [provider]  Cholecalciferol (VITAMIN D3) 1000 units CAPS Take 1,000 Units by mouth daily.   Yes [provider]  fish oil-omega-3 fatty acids 1000 MG capsule Take 1 g by mouth 2 (two) times daily.   Yes [provider]  fluticasone (FLONASE) 50 MCG/ACT nasal spray Place 1-2 sprays into both nostrils daily.   Yes [provider]  Fluticasone-Salmeterol (ADVAIR) 250-50 MCG/DOSE AEPB Inhale 1 puff into the lungs 2 (two) times daily. 05/12/19  Yes [provider]  glucosamine-chondroitin 500-400 MG tablet Take 1 tablet by mouth at bedtime.   Yes [provider]  ipratropium-albuterol (DUONEB) 0.5-2.5 (3) MG/3ML SOLN Take 3 mLs by nebulization every 6 (six) hours as needed. 07/11/21  Yes [provider]  latanoprost (XALATAN) 0.005 % ophthalmic solution Place 1 drop into both eyes at bedtime. 06/27/21  Yes [provider]  metroNIDAZOLE (METROCREAM) 0.75 % cream Apply 1 application  topically 2 (two) times daily  as needed (for rosacea).   Yes [provider]  minocycline (DYNACIN) 50 MG tablet Take 50 mg by mouth 2 (two) times daily.   Yes [provider]  mycophenolate (CELLCEPT) 500 MG tablet Take 2 tablets (1,000 mg total) by mouth 2 (two) times daily. 03/27/23 03/26/24 Yes Mannam, Praveen, MD  NEOMYCIN-POLYMYXIN-HYDROCORTISONE (CORTISPORIN) 1 % SOLN OTIC solution Place 1 drop into both ears as needed (when ears are "stopped up"). 05/13/19  Yes [provider]  nitroGLYCERIN (NITROSTAT) 0.4 MG SL tablet Place 1 tablet (0.4 mg total) under the tongue every 5 (five) minutes as needed for chest pain. 02/14/22  Yes Nahser, Deloris Ping, MD  Polyethyl Glycol-Propyl Glycol 0.4-0.3 % SOLN Place 1 drop into both eyes in the morning  and at bedtime.   Yes [provider]  potassium chloride SA (KLOR-CON M) 20 MEQ tablet TAKE 1 TABLET EVERY DAY 05/09/23  Yes Nahser, Deloris Ping, MD  predniSONE (DELTASONE) 1 MG tablet Take 1 mg by mouth at bedtime. 05/19/23  Yes [provider]  predniSONE (DELTASONE) 5 MG tablet Take 5 mg by mouth daily with breakfast.   Yes [provider]  sodium chloride (OCEAN) 0.65 % nasal spray Place 1 spray into the nose as needed for congestion.   Yes [provider]  torsemide (DEMADEX) 20 MG tablet TAKE 1 TABLET BY MOUTH EVERY DAY 05/07/23  Yes Nahser, Deloris Ping, MD  cholecalciferol (VITAMIN D) 1000 UNITS tablet Take 1,000 Units by mouth daily.    [provider]    Physical Exam: Vitals:   09/07/23 1200 09/07/23 1400  BP: 113/73 119/68  Pulse: (!) 110 (!) 109  Resp: 20 (!) 35  SpO2: 100% 97%   General: Patient is alert and awake, gives a reasonably coherent account of his symptoms.  However patient is still tachypneic.  Patient is not distressed.  Patient is not tripoding or anything but tachypnea is noted as above in vitals. Respiratory exam: Bilateral air entry vesicular with crackles and bibasilar.  I am not sure if it  is new or old given patient's chronic interstitial lung disease.  Patient has diffuse expiratory wheeze, however a lot of it is upper airway breath sounds. Cardiovascular exam S1-S2 normal Abdomen all quadrants are soft nontender Extremities warm 1+ edema bilateral ankles.  Distal function intact. Data Reviewed:  Labs on Admission:  Results for orders placed or performed during the hospital encounter of 09/07/23 (from the past 24 hours)  Blood gas, venous (at [facility] and [facility])     Status: Abnormal   Collection Time: 09/07/23 12:49 PM  Result Value Ref Range   pH, Ven 7.47 (H) 7.25 - 7.43   pCO2, Ven 42 (L) 44 - 60 mmHg   pO2, Ven <31 (LL) 32 - 45 mmHg   Bicarbonate 30.6 (H) 20.0 - 28.0 mmol/L   Acid-Base Excess 6.2 (H) 0.0 - 2.0 mmol/L   O2 Saturation 30.5 %   Patient temperature 37.0   Comprehensive metabolic panel     Status: Abnormal   Collection Time: 09/07/23 12:50 PM  Result Value Ref Range   Sodium 138 135 - 145 mmol/L   Potassium 3.9 3.5 - 5.1 mmol/L   Chloride 99 98 - 111 mmol/L   CO2 25 22 - 32 mmol/L   Glucose, Bld 171 (H) 70 - 99 mg/dL   BUN 41 (H) 8 - 23 mg/dL   Creatinine, Ser 0.98 (H) 0.61 - 1.24 mg/dL   Calcium 8.5 (L) 8.9 - 10.3 mg/dL   Total Protein 6.5 6.5 - 8.1 g/dL   Albumin 3.4 (L) 3.5 - 5.0 g/dL   AST 24 15 - 41 U/L   ALT 15 0 - 44 U/L   Alkaline Phosphatase 42 38 - 126 U/L   Total Bilirubin 1.4 (H) 0.0 - 1.2 mg/dL   GFR, Estimated 43 (L) >60 mL/min   Anion gap 14 5 - 15  CBC with Differential     Status: Abnormal   Collection Time: 09/07/23 12:50 PM  Result Value Ref Range   WBC 14.4 (H) 4.0 - 10.5 K/uL   RBC 3.72 (L) 4.22 - 5.81 MIL/uL   Hemoglobin 13.1 13.0 - 17.0 g/dL   HCT 11.9 14.7 - 82.9 %   MCV 109.1 (  H) 80.0 - 100.0 fL   MCH 35.2 (H) 26.0 - 34.0 pg   MCHC 32.3 30.0 - 36.0 g/dL   RDW 21.3 (H) 08.6 - 57.8 %   Platelets 149 (L) 150 - 400 K/uL   nRBC 0.0 0.0 - 0.2 %   Neutrophils Relative % 91 %   Neutro Abs 13.2 (H) 1.7 - 7.7 K/uL    Lymphocytes Relative 5 %   Lymphs Abs 0.7 0.7 - 4.0 K/uL   Monocytes Relative 3 %   Monocytes Absolute 0.5 0.1 - 1.0 K/uL   Eosinophils Relative 0 %   Eosinophils Absolute 0.0 0.0 - 0.5 K/uL   Basophils Relative 0 %   Basophils Absolute 0.0 0.0 - 0.1 K/uL   WBC Morphology Mild Left Shift (1-5% metas, occ myelo)    Smear Review PLATELET COUNT CONFIRMED BY SMEAR    Immature Granulocytes 1 %   Abs Immature Granulocytes 0.11 (H) 0.00 - 0.07 K/uL   Acanthocytes PRESENT   Protime-INR     Status: None   Collection Time: 09/07/23 12:50 PM  Result Value Ref Range   Prothrombin Time 14.6 11.4 - 15.2 seconds   INR 1.1 0.8 - 1.2  APTT     Status: None   Collection Time: 09/07/23 12:50 PM  Result Value Ref Range   aPTT 30 24 - 36 seconds  I-Stat Lactic Acid, ED     Status: Abnormal   Collection Time: 09/07/23 12:51 PM  Result Value Ref Range   Lactic Acid, Venous 2.6 (HH) 0.5 - 1.9 mmol/L  I-Stat Lactic Acid, ED     Status: None   Collection Time: 09/07/23  2:32 PM  Result Value Ref Range   Lactic Acid, Venous 1.9 0.5 - 1.9 mmol/L   Basic Metabolic Panel: Recent Labs  Lab 09/07/23 1250  NA 138  K 3.9  CL 99  CO2 25  GLUCOSE 171*  BUN 41*  CREATININE 1.56*  CALCIUM 8.5*   Liver Function Tests: Recent Labs  Lab 09/07/23 1250  AST 24  ALT 15  ALKPHOS 42  BILITOT 1.4*  PROT 6.5  ALBUMIN 3.4*   No results for input(s): "LIPASE", "AMYLASE" in the last 168 hours. No results for input(s): "AMMONIA" in the last 168 hours. CBC: Recent Labs  Lab 09/07/23 1250  WBC 14.4*  NEUTROABS 13.2*  HGB 13.1  HCT 40.6  MCV 109.1*  PLT 149*   Cardiac Enzymes: No results for input(s): "CKTOTAL", "CKMB", "CKMBINDEX", "TROPONINIHS" in the last 168 hours.  BNP (last 3 results) No results for input(s): "PROBNP" in the last 8760 hours. CBG: No results for input(s): "GLUCAP" in the last 168 hours.  Radiological Exams on Admission:  DG Chest Port 1 View Result Date:  09/07/2023 CLINICAL DATA:  Shortness of breath. EXAM: PORTABLE CHEST 1 VIEW COMPARISON:  June 19, 2022. FINDINGS: Stable cardiomediastinal silhouette. Minimal bibasilar subsegmental atelectasis or possibly infiltrates are noted. Bony thorax is unremarkable. IMPRESSION: Minimal bibasilar subsegmental atelectasis or possibly infiltrates. Electronically Signed   By: Lupita Raider M.D.   On: 09/07/2023 14:07    EKG: Independently reviewed. RBBB.  No intake/output data recorded. No intake/output data recorded.       Assessment and Plan: AKI (acute kidney injury) (HCC) Patient baseline creatinine seems to be normal, currently creatinine 1.56.  This is in the setting of recent vomiting and diarrhea which has since then resolved.  Also noted hypoxia.  Patient has already received half liter fluid bolus.  At this  time patient does have some ankle edema bilaterally.  And also in fair amount of tachypnea.  I will hold off on further fluids.  I think we will trend the creatinine.  Check UA lites and sodium. Lactic acidois resolved  Rheumatoid arthritis (HCC) C/w chronic steroid. Hold cellcept due to active infection. Patient already got methylprednisolon 125 in the ER. Will monitor bp/need for further stress dosing.  Pneumonia Chest x-ray finding of minimal bibasilar segmental atelectasis versus infiltrates.  Associated with new hypoxemia requiring between 2 to 4 L/min of supplementary oxygen (new).  Leukocytosis as well as minimal lactic acidosis.  2.6.  Patient felt to have sepsis on presentation.  S/p half a liter of fluid.  Lactic acidosis resolved.  Patient not looking toxic.  However still tachypneic.  Patient is influenza A positive.  Will treat with Tamiflu.  Started empirically on ceftriaxone and azithromycin in the ER which will be continued. Check covid 19 PCR  Patient has chronic interstitial lung disease.  Noted to have some wheezing on exam, but a lot of it seems to be upper airway  wheezing.  Already received Solu-Medrol IV.  I will defer further steroid therapy for this indication at this time.  I will order bronchodilator therapy.  As well as Dulera.   Medication reconciliation done as follows:  Continue with aspirin Continue with Zyrtec changed to Claritin Continue with atorvastatin Continue with Coreg at reduced dose of 3.125 Continue with prednisone 60 mg daily Continue with fish oil 1 g twice daily Continue with Alphagan 2% ophthalmic solution twice daily Continue with Xalatan ophthalmic solution Continue with polyethylene glycol eyedrops Continue with minocycline 50 twice daily for rosacea/acne Continue with metronidazole cream topical for rosacea.  Defer nitroglycerin as needed Defer CellCept Defer fluticasone nasal spray   Advance Care Planning:   Code Status: Full Code discussed with pateint.  Consults: none  Family Communication: per pateint.  Severity of Illness: The appropriate patient status for this patient is INPATIENT. Inpatient status is judged to be reasonable and necessary in order to provide the required intensity of service to ensure the patient's safety. The patient's presenting symptoms, physical exam findings, and initial radiographic and laboratory data in the context of their chronic comorbidities is felt to place them at high risk for further clinical deterioration. Furthermore, it is not anticipated that the patient will be medically stable for discharge from the hospital within 2 midnights of admission.   * I certify that at the point of admission it is my clinical judgment that the patient will require inpatient hospital care spanning beyond 2 midnights from the point of admission due to high intensity of service, high risk for further deterioration and high frequency of surveillance required.*  Author: Nolberto Hanlon, MD 09/07/2023 3:26 PM  For on call review [email or web address].

## 2023-09-07 NOTE — ED Provider Notes (Signed)
[place] [facility] DEPARTMENT AT [facility] Provider Note   CSN: [id number] Arrival date & time: 09/07/23  1146     History  Chief Complaint  Patient presents with   Shortness of Breath   Influenza    Jeffrey Stewart is a 89 y.o. male.  Patient is an 89 year old male with a history of CAD, CHF, hyperlipidemia, pulmonary fibrosis, Polymyalgia rheumatica on 6 mg of prednisone daily who is presenting today from his doctor's office due to worsening cough, shortness of breath and hypoxia.  Patient reports last week he had the flu and did test positive for influenza at his doctor's office with generalized bodyaches, fever, cough and congestion.  He reports then as he was recovering from that he got the norovirus with vomiting and diarrhea earlier this week which had pretty much improved but then reported yesterday he started to go downhill.  He was having more shortness of breath and cough.  The cough has been nonproductive and he has not noticed a fever.  He reports no further vomiting or diarrhea but reports he just could not catch his breath.  He does not wear any oxygen at home so went to his doctor today and was noted to have a oxygen of 84% on room air.  He was given a DuoNeb prior to arrival reports it has helped some but he still feeling winded.  Has never been a smoker.  The history is provided by the patient, medical records and the EMS personnel.  Shortness of Breath Influenza Presenting symptoms: shortness of breath        Home Medications Prior to Admission medications   Medication Sig Start Date End Date Taking? Authorizing Provider  acetaminophen (TYLENOL) 500 MG tablet Take 1,000 mg by mouth every 6 (six) hours as needed for moderate pain.    [provider]  albuterol (VENTOLIN HFA) 108 (90 Base) MCG/ACT inhaler Inhale 2 puffs into the lungs every 4 (four) hours as needed for wheezing or shortness of breath. 06/19/22   Cathren Laine, MD  aspirin EC 81  MG tablet Take 81 mg by mouth every evening.    [provider]  atorvastatin (LIPITOR) 40 MG tablet TAKE 1 TABLET AT BEDTIME 08/01/23   Nahser, Deloris Ping, MD  brimonidine (ALPHAGAN) 0.2 % ophthalmic solution 1 drop 2 (two) times daily. 1 drop 2 (two) times daily. 07/20/21   [provider]  carvedilol (COREG) 6.25 MG tablet TAKE 1 TABLET BY MOUTH TWICE A DAY 03/07/23   Swinyer, Zachary George, NP  cetirizine (ZYRTEC) 10 MG tablet Take 10 mg by mouth at bedtime.    [provider]  cholecalciferol (VITAMIN D) 1000 UNITS tablet Take 1,000 Units by mouth daily.    [provider]  fish oil-omega-3 fatty acids 1000 MG capsule Take 1 g by mouth 2 (two) times daily.    [provider]  fluticasone (FLONASE) 50 MCG/ACT nasal spray Place 1-2 sprays into both nostrils daily.    [provider]  Fluticasone-Salmeterol (ADVAIR) 250-50 MCG/DOSE AEPB Inhale 1 puff into the lungs 2 (two) times daily. 05/12/19   [provider]  glucosamine-chondroitin 500-400 MG tablet Take 1 tablet by mouth daily.    [provider]  ipratropium-albuterol (DUONEB) 0.5-2.5 (3) MG/3ML SOLN SMARTSIG:3 Milliliter(s) Via Nebulizer Every 6 Hours PRN 07/11/21   [provider]  latanoprost (XALATAN) 0.005 % ophthalmic solution 1 drop at bedtime. 06/27/21   [provider]  metroNIDAZOLE (METROCREAM) 0.75 % cream  Apply 1 application topically 2 (two) times daily.     [provider]  minocycline (DYNACIN) 50 MG tablet Take 50 mg by mouth 2 (two) times daily.    [provider]  mycophenolate (CELLCEPT) 500 MG tablet Take 2 tablets (1,000 mg total) by mouth 2 (two) times daily. 03/27/23 03/26/24  Mannam, Colbert Coyer, MD  NEOMYCIN-POLYMYXIN-HYDROCORTISONE (CORTISPORIN) 1 % SOLN OTIC solution Place 1 drop into both ears as needed. 05/13/19   [provider]  nitroGLYCERIN (NITROSTAT) 0.4 MG SL tablet Place 1 tablet (0.4 mg total) under the  tongue every 5 (five) minutes as needed for chest pain. 02/14/22   Nahser, Deloris Ping, MD  Polyethyl Glycol-Propyl Glycol 0.4-0.3 % SOLN Place 1 drop into both eyes 2 (two) times daily as needed (dry eyes).    [provider]  potassium chloride SA (KLOR-CON M) 20 MEQ tablet TAKE 1 TABLET EVERY DAY 05/09/23   Nahser, Deloris Ping, MD  predniSONE (DELTASONE) 1 MG tablet Take 1 mg by mouth at bedtime. 05/19/23   [provider]  predniSONE (DELTASONE) 5 MG tablet TAKE 5 MG TABLET BY MOUTH IN THE MORNING AND 1 MG TABLET BY MOUTH AT BEDTIME    [provider]  sodium chloride (OCEAN) 0.65 % nasal spray Place 1 spray into the nose as needed for congestion.    [provider]  torsemide (DEMADEX) 20 MG tablet TAKE 1 TABLET BY MOUTH EVERY DAY 05/07/23   Nahser, Deloris Ping, MD      Allergies    Niaspan [niacin] and Meloxicam    Review of Systems   Review of Systems  Respiratory:  Positive for shortness of breath.     Physical Exam Updated Vital Signs BP 113/73   Pulse (!) 110   Resp 20   SpO2 100%  Physical Exam Vitals and nursing note reviewed.  Constitutional:      General: He is not in acute distress.    Appearance: He is well-developed. He is ill-appearing.  HENT:     Head: Normocephalic and atraumatic.     Mouth/Throat:     Mouth: Mucous membranes are dry.  Eyes:     Conjunctiva/sclera: Conjunctivae normal.     Pupils: Pupils are equal, round, and reactive to light.  Cardiovascular:     Rate and Rhythm: Regular rhythm. Tachycardia present.     Pulses: Normal pulses.     Heart sounds: No murmur heard. Pulmonary:     Effort: Pulmonary effort is normal. No respiratory distress.     Breath sounds: Wheezing and rhonchi present. No rales.  Abdominal:     General: There is no distension.     Palpations: Abdomen is soft.     Tenderness: There is no abdominal tenderness. There is no guarding or rebound.  Musculoskeletal:        General: No tenderness.  Normal range of motion.     Cervical back: Normal range of motion and neck supple.     Comments: Trace edema at the sock line bilaterally  Skin:    General: Skin is warm and dry.     Findings: No erythema or rash.  Neurological:     Mental Status: He is alert and oriented to person, place, and time. Mental status is at baseline.  Psychiatric:        Mood and Affect: Mood normal.        Behavior: Behavior normal.     ED Results / Procedures / Treatments   Labs (  all labs ordered are listed, but only abnormal results are displayed) Labs Reviewed  COMPREHENSIVE METABOLIC PANEL - Abnormal; Notable for the following components:      Result Value   Glucose, Bld 171 (*)    BUN 41 (*)    Creatinine, Ser 1.56 (*)    Calcium 8.5 (*)    Albumin 3.4 (*)    Total Bilirubin 1.4 (*)    GFR, Estimated 43 (*)    All other components within normal limits  CBC WITH DIFFERENTIAL/PLATELET - Abnormal; Notable for the following components:   WBC 14.4 (*)    RBC 3.72 (*)    MCV 109.1 (*)    MCH 35.2 (*)    RDW 17.6 (*)    Platelets 149 (*)    Neutro Abs 13.2 (*)    Abs Immature Granulocytes 0.11 (*)    All other components within normal limits  BLOOD GAS, VENOUS - Abnormal; Notable for the following components:   pH, Ven 7.47 (*)    pCO2, Ven 42 (*)    pO2, Ven <31 (*)    Bicarbonate 30.6 (*)    Acid-Base Excess 6.2 (*)    All other components within normal limits  I-STAT CG4 LACTIC ACID, ED - Abnormal; Notable for the following components:   Lactic Acid, Venous 2.6 (*)    All other components within normal limits  CULTURE, BLOOD (ROUTINE X 2)  CULTURE, BLOOD (ROUTINE X 2)  PROTIME-INR  APTT  I-STAT CG4 LACTIC ACID, ED    EKG EKG Interpretation Date/Time:  Friday September 07 2023 13:24:47 EST Ventricular Rate:  104 PR Interval:  142 QRS Duration:  150 QT Interval:  361 QTC Calculation: 475 R Axis:   -25  Text Interpretation: Sinus tachycardia Right bundle branch block Inferior  infarct, old Lateral leads are also involved No significant change since last tracing Confirmed by Gwyneth Sprout ([id number]) on 09/07/2023 1:49:30 PM  Radiology DG Chest Port 1 View Result Date: 09/07/2023 CLINICAL DATA:  Shortness of breath. EXAM: PORTABLE CHEST 1 VIEW COMPARISON:  June 19, 2022. FINDINGS: Stable cardiomediastinal silhouette. Minimal bibasilar subsegmental atelectasis or possibly infiltrates are noted. Bony thorax is unremarkable. IMPRESSION: Minimal bibasilar subsegmental atelectasis or possibly infiltrates. Electronically Signed   By: Lupita Raider M.D.   On: 09/07/2023 14:07    Procedures Procedures    Medications Ordered in ED Medications  lactated ringers infusion (has no administration in time range)  azithromycin (ZITHROMAX) 500 mg in sodium chloride 0.9 % 250 mL IVPB (500 mg Intravenous New Bag/Given 09/07/23 1418)  lactated ringers bolus 500 mL (0 mLs Intravenous Stopped 09/07/23 1324)  albuterol (PROVENTIL) (2.5 MG/3ML) 0.083% nebulizer solution 5 mg (5 mg Nebulization Given 09/07/23 1310)  ipratropium (ATROVENT) nebulizer solution 0.5 mg (0.5 mg Nebulization Given 09/07/23 1310)  methylPREDNISolone sodium succinate (SOLU-MEDROL) 125 mg/2 mL injection 125 mg (125 mg Intravenous Given 09/07/23 1311)  cefTRIAXone (ROCEPHIN) 2 g in sodium chloride 0.9 % 100 mL IVPB (0 g Intravenous Stopped 09/07/23 1419)    ED Course/ Medical Decision Making/ A&P                                 Medical Decision Making Amount and/or Complexity of Data Reviewed External Data Reviewed: notes. Labs: ordered. Decision-making details documented in ED Course. Radiology: ordered and independent interpretation performed. Decision-making details documented in ED Course. ECG/medicine tests: ordered and independent interpretation performed. Decision-making details documented  in ED Course.  Risk Prescription drug management. Decision regarding hospitalization.   Pt with multiple  medical problems and comorbidities and presenting today with a complaint that caries a high risk for morbidity and mortality.  Here today with shortness of breath.  Concern for secondary pneumonia in the setting of recent flu versus bronchitis and COPD exacerbation.  Patient does not have significant endings of fluid overload today and lower suspicion for CHF exacerbation.  Last echo in 2023 showed an EF of about 50% with RV dysfunction.  Patient given recurrent albuterol and Atrovent and given Solu-Medrol.  2:43 PM I independently patient's labs and lactic acid is 2.6 with a normal repeat lactic acid of 1.9, CMP with new AKI with creatinine of 1.56 from his baseline of 0.9 and normal electrolytes.  CBC with a leukocytosis of 14.4 with normal hemoglobin and a mild left shift.  VBG with preserved pH and CO2.  I have independently visualized and interpreted pt's images today.  Chest x-ray with concern for right sided infiltrates.  Patient initiated as a code sepsis and started on antibiotics for pneumonia.  After second breathing treatment patient's wheezing has improved but still has rhonchorous breath sounds.  Sats are 97% on 2 L.  Will admit for further care.  Patient is comfortable with this plan.  Consulted hospitalist for admission.         Final Clinical Impression(s) / ED Diagnoses Final diagnoses:  Influenza  Pneumonia of right lung due to infectious organism, unspecified part of lung  AKI (acute kidney injury) (HCC)  Sepsis with acute hypoxic respiratory failure without septic shock, due to unspecified organism [facility])    Rx / DC Orders ED Discharge Orders     None         Gwyneth Sprout, MD 09/07/23 1445

## 2023-09-08 ENCOUNTER — Inpatient Hospital Stay (HOSPITAL_COMMUNITY): Payer: Medicare HMO

## 2023-09-08 DIAGNOSIS — J849 Interstitial pulmonary disease, unspecified: Secondary | ICD-10-CM | POA: Diagnosis not present

## 2023-09-08 DIAGNOSIS — J9601 Acute respiratory failure with hypoxia: Secondary | ICD-10-CM

## 2023-09-08 DIAGNOSIS — J101 Influenza due to other identified influenza virus with other respiratory manifestations: Secondary | ICD-10-CM

## 2023-09-08 DIAGNOSIS — J189 Pneumonia, unspecified organism: Secondary | ICD-10-CM | POA: Diagnosis not present

## 2023-09-08 LAB — APTT: aPTT: 34 s (ref 24–36)

## 2023-09-08 LAB — BLOOD CULTURE ID PANEL (REFLEXED) - BCID2

## 2023-09-08 LAB — CBC
HCT: 34.1 % — ABNORMAL LOW (ref 39.0–52.0)
Hemoglobin: 11 g/dL — ABNORMAL LOW (ref 13.0–17.0)
MCH: 35.6 pg — ABNORMAL HIGH (ref 26.0–34.0)
MCHC: 32.3 g/dL (ref 30.0–36.0)
MCV: 110.4 fL — ABNORMAL HIGH (ref 80.0–100.0)
Platelets: 125 10*3/uL — ABNORMAL LOW (ref 150–400)
RBC: 3.09 MIL/uL — ABNORMAL LOW (ref 4.22–5.81)
RDW: 17.6 % — ABNORMAL HIGH (ref 11.5–15.5)
WBC: 13.5 10*3/uL — ABNORMAL HIGH (ref 4.0–10.5)
nRBC: 0 % (ref 0.0–0.2)

## 2023-09-08 LAB — BASIC METABOLIC PANEL
Anion gap: 9 (ref 5–15)
BUN: 49 mg/dL — ABNORMAL HIGH (ref 8–23)
CO2: 28 mmol/L (ref 22–32)
Calcium: 8.3 mg/dL — ABNORMAL LOW (ref 8.9–10.3)
Chloride: 100 mmol/L (ref 98–111)
Creatinine, Ser: 1.29 mg/dL — ABNORMAL HIGH (ref 0.61–1.24)
GFR, Estimated: 54 mL/min — ABNORMAL LOW (ref >60)
Glucose, Bld: 154 mg/dL — ABNORMAL HIGH (ref 70–99)
Potassium: 4 mmol/L (ref 3.5–5.1)
Sodium: 137 mmol/L (ref 135–145)

## 2023-09-08 LAB — URINALYSIS, COMPLETE (UACMP) WITH MICROSCOPIC
Bilirubin Urine: NEGATIVE
Glucose, UA: NEGATIVE mg/dL
Hgb urine dipstick: NEGATIVE
Ketones, ur: NEGATIVE mg/dL
Leukocytes,Ua: NEGATIVE
Nitrite: NEGATIVE
Protein, ur: NEGATIVE mg/dL
Specific Gravity, Urine: 1.023 (ref 1.005–1.030)
pH: 5 (ref 5.0–8.0)

## 2023-09-08 LAB — BRAIN NATRIURETIC PEPTIDE: B Natriuretic Peptide: 77.7 pg/mL (ref 0.0–100.0)

## 2023-09-08 LAB — PROTIME-INR
INR: 1.1 (ref 0.8–1.2)
Prothrombin Time: 14.8 s (ref 11.4–15.2)

## 2023-09-08 LAB — SODIUM, URINE, RANDOM: Sodium, Ur: <10 mmol/L

## 2023-09-08 LAB — EXPECTORATED SPUTUM ASSESSMENT W GRAM STAIN, RFLX TO RESP C

## 2023-09-08 LAB — CREATININE, URINE, RANDOM: Creatinine, Urine: 177 mg/dL

## 2023-09-08 LAB — PROCALCITONIN: Procalcitonin: 6.83 ng/mL

## 2023-09-08 LAB — STREP PNEUMONIAE URINARY ANTIGEN: Strep Pneumo Urinary Antigen: POSITIVE — AB

## 2023-09-08 MED ORDER — TORSEMIDE 20 MG PO TABS: 20.0000 mg | ORAL_TABLET | Freq: Every day | ORAL | Status: DC

## 2023-09-08 MED ORDER — FLUTICASONE PROPIONATE 50 MCG/ACT NA SUSP
1.0000 | Freq: Every day | NASAL | Status: DC
  Administered 2023-09-08 – 2023-09-11 (×4): 1 via NASAL
  Filled 2023-09-08: qty 16

## 2023-09-08 MED ORDER — BUDESONIDE 0.25 MG/2ML IN SUSP
0.2500 mg | Freq: Two times a day (BID) | RESPIRATORY_TRACT | Status: DC
  Administered 2023-09-08: 0.25 mg via RESPIRATORY_TRACT
  Filled 2023-09-08: qty 2

## 2023-09-08 MED ORDER — IPRATROPIUM-ALBUTEROL 0.5-2.5 (3) MG/3ML IN SOLN
3.0000 mL | RESPIRATORY_TRACT | Status: DC
  Administered 2023-09-08 – 2023-09-10 (×12): 3 mL via RESPIRATORY_TRACT
  Filled 2023-09-08 (×11): qty 3

## 2023-09-08 MED ORDER — ARFORMOTEROL TARTRATE 15 MCG/2ML IN NEBU
15.0000 ug | INHALATION_SOLUTION | Freq: Two times a day (BID) | RESPIRATORY_TRACT | Status: DC
  Administered 2023-09-08 – 2023-09-11 (×7): 15 ug via RESPIRATORY_TRACT
  Filled 2023-09-08 (×7): qty 2

## 2023-09-08 MED ORDER — NEOMYCIN-POLYMYXIN-HC 1 % OT SOLN: 1.0000 [drp] | OTIC | Status: DC | PRN

## 2023-09-08 MED ORDER — FUROSEMIDE 10 MG/ML IJ SOLN
60.0000 mg | Freq: Every day | INTRAMUSCULAR | Status: DC
  Administered 2023-09-08: 60 mg via INTRAVENOUS
  Filled 2023-09-08: qty 6

## 2023-09-08 MED ORDER — MYCOPHENOLATE MOFETIL 250 MG PO CAPS
1000.0000 mg | ORAL_CAPSULE | Freq: Two times a day (BID) | ORAL | Status: DC
  Administered 2023-09-08: 1000 mg via ORAL
  Filled 2023-09-08 (×2): qty 4

## 2023-09-08 MED ORDER — METHYLPREDNISOLONE SODIUM SUCC 40 MG IJ SOLR
40.0000 mg | Freq: Two times a day (BID) | INTRAMUSCULAR | Status: DC
  Administered 2023-09-08 – 2023-09-11 (×7): 40 mg via INTRAVENOUS
  Filled 2023-09-08 (×7): qty 1

## 2023-09-08 MED ORDER — BUDESONIDE 0.5 MG/2ML IN SUSP
0.5000 mg | Freq: Two times a day (BID) | RESPIRATORY_TRACT | Status: DC
  Administered 2023-09-08 – 2023-09-11 (×6): 0.5 mg via RESPIRATORY_TRACT
  Filled 2023-09-08 (×6): qty 2

## 2023-09-08 MED ORDER — ALBUTEROL SULFATE (2.5 MG/3ML) 0.083% IN NEBU: 2.5000 mg | INHALATION_SOLUTION | RESPIRATORY_TRACT | Status: DC | PRN

## 2023-09-08 NOTE — Plan of Care (Signed)
  Problem: Clinical Measurements: Goal: Ability to maintain clinical measurements within normal limits will improve Outcome: Progressing   Problem: Clinical Measurements: Goal: Diagnostic test results will improve Outcome: Progressing   Problem: Clinical Measurements: Goal: Respiratory complications will improve Outcome: Progressing   Problem: Activity: Goal: Risk for activity intolerance will decrease Outcome: Progressing   

## 2023-09-08 NOTE — Hospital Course (Addendum)
 89 year old male with history of interstitial lung disease/abnormal CT scan, COVID-19 in 2022, polymyalgia rheumatica/seronegative rheumatoid arthritis on chronic prednisone, chronic CHF, CAD, HTN presented with worsening cough with yellow sputum and worsening shortness of breath from baseline without chest pain palpitation. Patient was seen by primary care office the day of admission for above symptoms was hypoxic 84% on room air and transferred to ED.  Workup with influenza positive. In the ED: Vitals showed hypoxia needing 4 L nasal cannula tachypneic around 30/min, VBG with pH 7.4 pCO2 42 pO2 less than 31, CMP showed total bili 1.4 creatinine 1.5 CBC with leukocytosis Lactic acid 2.6 on recheck 1.9. Chest x-ray>> minimal bibasilar subsegmental atelectasis or possibly infiltrates. Patient was managed with IV antibiotics steroid and IV Lasix overall he is clinically improved.  Less wheezing less short of breath able to ambulate well, we will discharge him on oral steroid taper, resume his home CellCept + .He did well on ambulation without hypoxia or shortness of breath and he feels comfortable going home today

## 2023-09-08 NOTE — Progress Notes (Signed)
 Mobility Specialist - Progress Note   09/08/23 1109  Mobility  Activity Dangled on edge of bed  Activity Response Tolerated well  Mobility Referral Yes  Mobility visit 1 Mobility  Mobility Specialist Start Time (ACUTE ONLY) 1047  Mobility Specialist Stop Time (ACUTE ONLY) 1108  Mobility Specialist Time Calculation (min) (ACUTE ONLY) 21 min   Pt received in bed and agreeable to mobility. Once standing RN & Xray came into room. Assisted pt back to bed for Xray. Will attempt ambulation this afternoon. Pt to bed after session with all needs met. RN in room.     [facility]

## 2023-09-08 NOTE — Progress Notes (Addendum)
 PROGRESS NOTE Jeffrey Stewart  ZOX:[id number] DOB: February 22, 1935 DOA: 09/07/2023 PCP: Fredrich Romans, FNP  Brief Narrative/Hospital Course: 89 year old male with history of interstitial lung disease/abnormal CT scan, COVID-19 in 2022, polymyalgia rheumatica/seronegative rheumatoid arthritis on chronic prednisone, chronic CHF, CAD, HTN presented with worsening cough with yellow sputum and worsening shortness of breath from baseline without chest pain palpitation. Patient was seen by primary care office the day of admission for above symptoms was hypoxic 84% on room air and transferred to ED.  Workup with influenza positive. In the ED: Vitals showed hypoxia needing 4 L nasal cannula tachypneic around 30/min, VBG with pH 7.4 pCO2 42 pO2 less than 31, CMP showed total bili 1.4 creatinine 1.5 CBC with leukocytosis Lactic acid 2.6 on recheck 1.9. Chest x-ray>> minimal bibasilar subsegmental atelectasis or possibly infiltrates.     Subjective: Seen and examined this morning He feels somewhat worse today, coughing and has wheezing On 4 L nasal cannula, afebrile overnight Labs creatinine improved 1.2 bilirubin downtrending   Assessment and Plan: Active Problems:   CHF (congestive heart failure) (HCC)   Pneumonia   Rheumatoid arthritis (HCC)   AKI (acute kidney injury) (HCC)   Assessment and Plan:  Acute hypoxic respiratory failure Influenza A infection Pneumonia Interstitial lung disease- on CellCept: Patient with cough shortness of breath and hypoxia w/ ILD at baseline- labs w/ lkocytosis mild lactic acidosis and chest x-ray with possible infiltrates.  Will continue Tamiflu empiric antibiotics due to concern for bacterial pneumonia with immunosuppressed status- chronic interstitial lung disease.  He has wheezing, will keep on IV steroid ho continue bronchodilator Pulmicortld home po steroid. Continue bronchodilators and Pulmicort.  Resume home CellCept.  Check procalcitonin/proBNP and sputum  culture. Will discuss with PCCM if does not improve quickly  AKI: Baseline creatinine 0.25 June 2023 on admission 1.5-1.6, improving.  Avoid nephrotoxic medication encourage oral hydration Recent Labs    03/27/23 1500 04/25/23 1017 07/05/23 1537 09/07/23 1250 09/07/23 1947 09/08/23 0733  BUN 22 24* 16 41*  --  49*  CREATININE 1.07 1.03 0.93 1.56* 1.63* 1.29*  CO2 32 32 27 25  --  28  K 4.3 4.4 4.4 3.9  --  4.0    Polymyalgia rheumatica/seronegative rheumatoid arthritis on chronic prednisone; On steroid per #1  HLD HTN CAD: BP controlled continue Coreg aspirin statin  Chronic diastolic CHF: This morning he sounds wet will recheck BNP chest x-ray and do IV Lasix 60 holding oral torsemide , continue on low-dose of Coreg  DVT prophylaxis: enoxaparin (LOVENOX) injection 40 mg Start: 09/08/23 0800 SCDs Start: 09/07/23 1525 Code Status:   Code Status: Full Code Family Communication: plan of care discussed with patient at bedside. Patient status is: Remains hospitalized because of severity of illness Level of care: Telemetry   Dispo: The patient is from: Home with wife            Anticipated disposition: TBD Objective: Vitals last 24 hrs: Vitals:   09/07/23 2221 09/08/23 0238 09/08/23 0239 09/08/23 0612  BP:   98/66 (!) 110/58  Pulse:   79 87  Resp:   20 (!) 22  Temp:   98 F (36.7 C) 97.9 F (36.6 C)  TempSrc:   Oral Oral  SpO2: 100% 100% 99% 100%   Weight change:   Physical Examination: General exam: alert awake,at baseline, older than stated age HEENT:Oral mucosa moist, Ear/Nose WNL grossly Respiratory system: Bilaterally wheezing and conducted crackles,no use of accessory muscle Cardiovascular system: S1 & S2 +, No  JVD. Gastrointestinal system: Abdomen soft,NT,ND, BS+ Nervous System: Alert, awake, moving all extremities,and following commands. Extremities: LE edema neg,distal peripheral pulses palpable and warm.  Skin: No rashes,no icterus. MSK: Normal  muscle bulk,tone, power   Medications reviewed:  Scheduled Meds:  aspirin EC  81 mg Oral QPM   atorvastatin  40 mg Oral QHS   brimonidine  1 drop Both Eyes BID   carvedilol  3.125 mg Oral BID   cholecalciferol  1,000 Units Oral Daily   enoxaparin (LOVENOX) injection  40 mg Subcutaneous Q24H   ipratropium-albuterol  3 mL Nebulization Q4H   latanoprost  1 drop Both Eyes QHS   loratadine  10 mg Oral Daily   minocycline  50 mg Oral BID   mometasone-formoterol  2 puff Inhalation BID   omega-3 acid ethyl esters  1 g Oral BID   oseltamivir  30 mg Oral BID   polyvinyl alcohol  1 drop Both Eyes BID   predniSONE  1 mg Oral QHS   predniSONE  5 mg Oral Q breakfast   sodium chloride flush  3 mL Intravenous Q12H   Continuous Infusions:  azithromycin     cefTRIAXone (ROCEPHIN)  IV      Diet Order             Diet 2 gram sodium Room service appropriate? Yes; Fluid consistency: Thin  Diet effective now                  Intake/Output Summary (Last 24 hours) at 09/08/2023 0952 Last data filed at 09/07/2023 1942 Gross per 24 hour  Intake 246.29 ml  Output 300 ml  Net -53.71 ml   Net IO Since Admission: -53.71 mL [09/08/23 0952]  Wt Readings from Last 3 Encounters:  07/05/23 95.1 kg  07/05/23 93.5 kg  06/25/23 92.6 kg     Unresulted Labs (From admission, onward)     Start     Ordered   09/14/23 0500  Creatinine, serum  (enoxaparin (LOVENOX)    CrCl >/= 30 ml/min)  Weekly,   R     Comments: while on enoxaparin therapy    09/07/23 1525   09/07/23 1524  Legionella Pneumophila Serogp 1 Ur Ag  Once,   URGENT        09/07/23 1523   09/07/23 1524  Strep pneumoniae urinary antigen  Once,   URGENT        09/07/23 1523   09/07/23 0615  Culture, Respiratory w Gram Stain  Once,   R        09/07/23 0615   Pending  C Difficile Quick Screen w PCR reflex  (C Difficile quick screen w PCR reflex panel )  Once, for 24 hours,   R     References:    CDiff Information Tool   Pending           Data Reviewed: I have personally reviewed following labs and imaging studies CBC: Recent Labs  Lab 09/07/23 1250 09/07/23 1947 09/08/23 0733  WBC 14.4* 15.7* 13.5*  NEUTROABS 13.2*  --   --   HGB 13.1 12.0* 11.0*  HCT 40.6 37.6* 34.1*  MCV 109.1* 109.9* 110.4*  PLT 149* 132* 125*   Basic Metabolic Panel:  Recent Labs  Lab 09/07/23 1250 09/07/23 1947 09/08/23 0733  NA 138  --  137  K 3.9  --  4.0  CL 99  --  100  CO2 25  --  28  GLUCOSE 171*  --  154*  BUN 41*  --  49*  CREATININE 1.56* 1.63* 1.29*  CALCIUM 8.5*  --  8.3*   GFR: CrCl cannot be calculated (Unknown ideal weight.). Liver Function Tests:  Recent Labs  Lab 09/07/23 1250  AST 24  ALT 15  ALKPHOS 42  BILITOT 1.4*  PROT 6.5  ALBUMIN 3.4*   No results for input(s): "LIPASE", "AMYLASE" in the last 168 hours. No results for input(s): "AMMONIA" in the last 168 hours. Coagulation Profile:  Recent Labs  Lab 09/07/23 1250 09/08/23 0733  INR 1.1 1.1   Sepsis Labs: Recent Labs  Lab 09/07/23 1251 09/07/23 1432  LATICACIDVEN 2.6* 1.9   Recent Results (from the past 240 hours)  Expectorated Sputum Assessment w Gram Stain, Rflx to Resp Cult     Status: None   Collection Time: 09/07/23  6:15 AM   Specimen: Expectorated Sputum  Result Value Ref Range Status   Specimen Description EXPECTORATED SPUTUM  Final   Special Requests NONE  Final   Sputum evaluation   Final    THIS SPECIMEN IS ACCEPTABLE FOR SPUTUM CULTURE Performed at [facility], [address], [place], [place] [address]    Report Status 09/08/2023 FINAL  Final  Blood Culture (routine x 2)     Status: None (Preliminary result)   Collection Time: 09/07/23 12:51 PM   Specimen: BLOOD  Result Value Ref Range Status   Specimen Description   Final    BLOOD RIGHT ANTECUBITAL Performed at [facility], [address] [place], [place] [address]    Special Requests   Final    BOTTLES DRAWN AEROBIC AND  ANAEROBIC Blood Culture adequate volume Performed at [facility], [address] [place], [place] [address]    Culture   Final    NO GROWTH < 24 HOURS Performed at [facility], [address], [place], [place] [address]    Report Status PENDING  Incomplete  Blood Culture (routine x 2)     Status: None (Preliminary result)   Collection Time: 09/07/23  1:04 PM   Specimen: BLOOD  Result Value Ref Range Status   Specimen Description   Final    BLOOD Performed at [facility], [address], [place], [place] [address]    Special Requests   Final    BOTTLES DRAWN AEROBIC AND ANAEROBIC Blood Culture results may not be optimal due to an inadequate volume of blood received in culture bottles Performed at [facility], [address] [place], [place] [address]    Culture   Final    NO GROWTH < 24 HOURS Performed at [facility], [address] [place], [place] [address]    Report Status PENDING  Incomplete  SARS Coronavirus 2 by RT PCR (hospital order, performed in [facility] hospital lab) *cepheid single result test* Anterior Nasal Swab     Status: None   Collection Time: 09/07/23  5:03 PM   Specimen: Anterior Nasal Swab  Result Value Ref Range Status   SARS Coronavirus 2 by RT PCR NEGATIVE NEGATIVE Final    Comment: (NOTE) SARS-CoV-2 target nucleic acids are NOT DETECTED.  The SARS-CoV-2 RNA is generally detectable in upper and lower respiratory specimens during the acute phase of infection. The lowest concentration of SARS-CoV-2 viral copies this assay can detect is 250 copies / mL. A negative result does not preclude SARS-CoV-2 infection and should not be used as the sole basis  for treatment or other patient management decisions.  A negative result may occur with improper specimen collection / handling, submission of specimen other than nasopharyngeal swab, presence of viral mutation(s) within the areas  targeted by this assay, and inadequate number of viral copies (<250 copies / mL). A negative result must be combined with clinical observations, patient history, and epidemiological information.  Fact Sheet for Patients:   RoadLapTop.co.za  Fact Sheet for Healthcare Providers: http://kim-miller.com/  This test is not yet approved or  cleared by the [place] FDA and has been authorized for detection and/or diagnosis of SARS-CoV-2 by FDA under an Emergency Use Authorization (EUA).  This EUA will remain in effect (meaning this test can be used) for the duration of the COVID-19 declaration under Section 564(b)(1) of the Act, 21 U.S.C. section 360bbb-3(b)(1), unless the authorization is terminated or revoked sooner.  Performed at [facility], [address], [place], [place] [address]     Antimicrobials/Microbiology: Anti-infectives (From admission, onward)    Start     Dose/Rate Route Frequency Ordered Stop   09/08/23 1200  cefTRIAXone (ROCEPHIN) 2 g in sodium chloride 0.9 % 100 mL IVPB        2 g 200 mL/hr over 30 Minutes Intravenous Every 24 hours 09/07/23 1522     09/08/23 1200  azithromycin (ZITHROMAX) 500 mg in sodium chloride 0.9 % 250 mL IVPB        500 mg 250 mL/hr over 60 Minutes Intravenous Every 24 hours 09/07/23 1522 09/11/23 1159   09/07/23 2200  minocycline (MINOCIN) capsule 50 mg        50 mg Oral 2 times daily 09/07/23 1808     09/07/23 2200  oseltamivir (TAMIFLU) capsule 30 mg        30 mg Oral 2 times daily 09/07/23 2119 09/12/23 2159   09/07/23 1530  oseltamivir (TAMIFLU) capsule 75 mg  Status:  Discontinued        75 mg Oral 2 times daily 09/07/23 1522 09/07/23 2119   09/07/23 1400  cefTRIAXone (ROCEPHIN) 2 g in sodium chloride 0.9 % 100 mL IVPB        2 g 200 mL/hr over 30 Minutes Intravenous Once 09/07/23 1348 09/07/23 1419   09/07/23 1400  azithromycin (ZITHROMAX) 500 mg in sodium chloride  0.9 % 250 mL IVPB        500 mg 250 mL/hr over 60 Minutes Intravenous  Once 09/07/23 1348 09/07/23 1518         Component Value Date/Time   SDES  09/07/2023 1304    BLOOD Performed at [facility], [address], [place], [place] [address]    SPECREQUEST  09/07/2023 1304    BOTTLES DRAWN AEROBIC AND ANAEROBIC Blood Culture results may not be optimal due to an inadequate volume of blood received in culture bottles Performed at [facility], [address], [place], [place] [address]    CULT  09/07/2023 1304    NO GROWTH < 24 HOURS Performed at [facility], [address] [place], [place] [address]    REPTSTATUS PENDING 09/07/2023 1304    Radiology Studies: [facility] Chest Port 1 View Result Date: 09/07/2023 CLINICAL DATA:  Shortness of breath. EXAM: PORTABLE CHEST 1 VIEW COMPARISON:  June 19, 2022. FINDINGS: Stable cardiomediastinal silhouette. Minimal bibasilar subsegmental atelectasis or possibly infiltrates are noted. Bony thorax is unremarkable. IMPRESSION: Minimal bibasilar subsegmental atelectasis or possibly infiltrates. Electronically Signed   By: Roque Lias  Jr M.D.   On: 09/07/2023 14:07   LOS: 1 day   Total time spent in review of labs and imaging, patient evaluation, formulation of plan, documentation and communication with family: 50  minutes  Lanae Boast, MD  [facility]  09/08/2023, 9:52 AM

## 2023-09-08 NOTE — Consult Note (Signed)
 NAME:  Jeffrey Stewart, MRN:  [id number], DOB:  10-13-1934, LOS: 1 ADMISSION DATE:  09/07/2023, CONSULTATION DATE:  09/08/2023  REFERRING MD:  Nonah Mattes, CHIEF COMPLAINT: Shortness of breath, hypoxia  History of Present Illness:  89 year old man with seronegative RA/ILD who is followed by my partner Dr. 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  He is also followed by rheumatology and maintained on 500 twice daily CellCept for PMR and seronegative RA.  He has failed a trial of methotrexate previously He is admitted with shortness of breath and cough productive of yellow sputum for 3 days.  He was hypoxic to 84% on room air and required 4 to 5 L of nasal cannula. Initial labs showed leukocytosis and lactate of 2.6 Chest x-ray showed minimal bibasilar infiltrates versus atelectasis  Pertinent  Medical History  Cirrhosis CAD CHF 07/05/2023 FVC 2.43 (4%), FEV1 1.75 (6%), F/F72, TLC 4.90 (69%], DLCO 13.08 [55%] Moderate restriction, diffusion defect  Significant Hospital Events: Including procedures, antibiotic start and stop dates in addition to other pertinent events     Interim History / Subjective:  Complains of shortness of breath Afebrile On 5 L nasal cannula  Objective   Blood pressure (!) 100/50, pulse 91, temperature (!) 97.5 F (36.4 C), temperature source Oral, resp. rate (!) 31, SpO2 98%.    FiO2 (%):  [4 %] 4 %   Intake/Output Summary (Last 24 hours) at 09/08/2023 1248 Last data filed at 09/08/2023 1153 Gross per 24 hour  Intake 246.29 ml  Output 700 ml  Net -453.71 ml   There were no vitals filed for this visit.  Examination: General: Elderly man, sitting up in bed, mildly short of breath HENT: Mild pallor, no icterus, no JVD Lungs: Bibasal crackles, faint scattered rhonchi Cardiovascular: S1-S2 regular, no murmur Abdomen: Soft, nontender, no fluid thrill Extremities:  No edema, chronic discoloration Neuro: Alert, oriented x 3, nonfocal  Labs show normal electrolytes, lactate resolved, mild leukocytosis and thrombocytopenia, improving renal function 49/1.3   Resolved Hospital Problem list     Assessment & Plan:  Acute hypoxic respiratory failure -likely related to influenza superimposed on underlying fibrotic ILD presumed to be RA/ILD [rather than IPF or chronic HP]  -Agree with treating with Tamiflu, IV steroids and bronchodilators for bronchospasm Expect his hypoxia to improve with this treatment Hold CellCept for 2 to 3 days until his overall status improves -Empiric ceftriaxone/doxycycline  I would not be surprised if he needs oxygen on discharge. Please obtain follow-up appointment with pulmonary at the time of discharge . PCCM will be available as needed  Best Practice (right click and "Reselect all SmartList Selections" daily)   Per [facility] Code Status:  full code Last date of multidisciplinary goals of care discussion [NA]  Labs   CBC: Recent Labs  Lab 09/07/23 1250 09/07/23 1947 09/08/23 0733  WBC 14.4* 15.7* 13.5*  NEUTROABS 13.2*  --   --   HGB 13.1 12.0* 11.0*  HCT 40.6 37.6* 34.1*  MCV 109.1* 109.9* 110.4*  PLT 149* 132* 125*    Basic Metabolic Panel: Recent Labs  Lab 09/07/23 1250 09/07/23 1947 09/08/23 0733  NA 138  --  137  K 3.9  --  4.0  CL 99  --  100  CO2 25  --  28  GLUCOSE 171*  --  154*  BUN 41*  --  49*  CREATININE 1.56* 1.63* 1.29*  CALCIUM 8.5*  --  8.3*   GFR: CrCl cannot be calculated (Unknown ideal weight.). Recent Labs  Lab 09/07/23 1250 09/07/23 1251 09/07/23 1432 09/07/23 1947 09/08/23 0733  WBC 14.4*  --   --  15.7* 13.5*  LATICACIDVEN  --  2.6* 1.9  --   --     Liver Function Tests: Recent Labs  Lab 09/07/23 1250  AST 24  ALT 15  ALKPHOS 42  BILITOT 1.4*  PROT 6.5  ALBUMIN 3.4*   No results for input(s): "LIPASE", "AMYLASE" in the last 168 hours. No results for input(s):  "AMMONIA" in the last 168 hours.  ABG    Component Value Date/Time   HCO3 30.6 (H) 09/07/2023 1249   TCO2 28 07/06/2010 0903   O2SAT 30.5 09/07/2023 1249     Coagulation Profile: Recent Labs  Lab 09/07/23 1250 09/08/23 0733  INR 1.1 1.1    Cardiac Enzymes: No results for input(s): "CKTOTAL", "CKMB", "CKMBINDEX", "TROPONINI" in the last 168 hours.  HbA1C: No results found for: "HGBA1C"  CBG: No results for input(s): "GLUCAP" in the last 168 hours.  Review of Systems:   Constitutional: negative for anorexia, fevers and sweats  Eyes: negative for irritation, redness and visual disturbance  Ears, nose, mouth, throat, and face: negative for earaches, epistaxis, nasal congestion and sore throat   Cardiovascular: negative for chest pain, dyspnea, lower extremity edema, orthopnea, palpitations and syncope  Gastrointestinal: negative for abdominal pain, constipation, melena, nausea and vomiting  Genitourinary:negative for dysuria, frequency and hematuria  Hematologic/lymphatic: negative for bleeding, easy bruising and lymphadenopathy  Musculoskeletal:negative for arthralgias, muscle weakness and stiff joints  Neurological: negative for coordination problems, gait problems, headaches and weakness  Endocrine: negative for diabetic symptoms including polydipsia, polyuria and weight loss   Past Medical History:  He,  has a past medical history of Allergy, Arthritis, Asthma, Cataract, CHF (congestive heart failure) (HCC) (11/2009), Coronary artery disease, Hyperlipidemia, Myocardial infarction (HCC), Pneumonia, Rosacea, and Shortness of breath.   Surgical History:   Past Surgical History:  Procedure Laterality Date   APPENDECTOMY     AGE 11   CARDIAC CATHETERIZATION  11/2009   mild / mod cad,MODERATE TIGHT STENOSIS IN THE DISTAL LEFT CIRCUMFLEX ARTERY   ELECTROCARDIOGRAM  11/2009   RBBB   EYE SURGERY     BILATERAL CATARACT EXTRACT EXTRACTION WITH IMPLANTS   INGUINAL HERNIA  REPAIR Bilateral 09/29/2013   Procedure: LAPAROSCOPIC BILATERAL  INGUINAL HERNIA;  Surgeon: Ernestene Mention, MD;  Location: [facility];  Service: General;  Laterality: Bilateral;   INSERTION OF MESH Bilateral 09/29/2013   Procedure: INSERTION OF MESH;  Surgeon: Ernestene Mention, MD;  Location: [facility];  Service: General;  Laterality: Bilateral;   last echo 02/2009     last nuc 2009  12/16/2007   EF 33%. ABNORMAL. HE HAS APICAL DEFECT CONSISTENT WITH A SCAR.LV FUNCTION MODERATELY  DEPRESSED   US ECHOCARDIOGRAPHY  02/23/2009   EF 45-50%   US ECHOCARDIOGRAPHY  12/27/2007   EF 45-50%     Social History:   reports that he has never smoked. He has never used smokeless tobacco. He reports that he does not currently use alcohol. He reports that he does not use drugs.   Family History:  His family history includes Diabetes in his sister; Lung cancer in his sister.   Allergies Allergies  Allergen Reactions   Niaspan [Niacin] Other (See Comments)    Intolerance  Flush feeling WHEN ON LARGE DOSE - IS ABLE TO TAKE SMALLER DOSAGE WITHOUT PROBLEM  Meloxicam     Disorientation      Home Medications  Prior to Admission medications   Medication Sig Start Date End Date Taking? Authorizing Provider  acetaminophen (TYLENOL) 500 MG tablet Take 1,000 mg by mouth every 6 (six) hours as needed for moderate pain.   Yes [provider]  albuterol (VENTOLIN HFA) 108 (90 Base) MCG/ACT inhaler Inhale 2 puffs into the lungs every 4 (four) hours as needed for wheezing or shortness of breath. 06/19/22  Yes Cathren Laine, MD  aspirin EC 81 MG tablet Take 81 mg by mouth every evening.   Yes [provider]  atorvastatin (LIPITOR) 40 MG tablet TAKE 1 TABLET AT BEDTIME 08/01/23  Yes Nahser, Deloris Ping, MD  brimonidine (ALPHAGAN) 0.2 % ophthalmic solution Place 1 drop into both eyes 2 (two) times daily. 07/20/21  Yes [provider]  carvedilol (COREG) 6.25 MG tablet TAKE 1 TABLET BY MOUTH TWICE A DAY  03/07/23  Yes Swinyer, Zachary George, NP  cetirizine (ZYRTEC) 10 MG tablet Take 10 mg by mouth at bedtime.   Yes [provider]  Cholecalciferol (VITAMIN D3) 1000 units CAPS Take 1,000 Units by mouth daily.   Yes [provider]  fish oil-omega-3 fatty acids 1000 MG capsule Take 1 g by mouth 2 (two) times daily.   Yes [provider]  fluticasone (FLONASE) 50 MCG/ACT nasal spray Place 1-2 sprays into both nostrils daily.   Yes [provider]  Fluticasone-Salmeterol (ADVAIR) 250-50 MCG/DOSE AEPB Inhale 1 puff into the lungs 2 (two) times daily. 05/12/19  Yes [provider]  glucosamine-chondroitin 500-400 MG tablet Take 1 tablet by mouth at bedtime.   Yes [provider]  ipratropium-albuterol (DUONEB) 0.5-2.5 (3) MG/3ML SOLN Take 3 mLs by nebulization every 6 (six) hours as needed. 07/11/21  Yes [provider]  latanoprost (XALATAN) 0.005 % ophthalmic solution Place 1 drop into both eyes at bedtime. 06/27/21  Yes [provider]  metroNIDAZOLE (METROCREAM) 0.75 % cream Apply 1 application  topically 2 (two) times daily as needed (for rosacea).   Yes [provider]  minocycline (DYNACIN) 50 MG tablet Take 50 mg by mouth 2 (two) times daily.   Yes [provider]  mycophenolate (CELLCEPT) 500 MG tablet Take 2 tablets (1,000 mg total) by mouth 2 (two) times daily. 03/27/23 03/26/24 Yes Mannam, Praveen, MD  NEOMYCIN-POLYMYXIN-HYDROCORTISONE (CORTISPORIN) 1 % SOLN OTIC solution Place 1 drop into both ears as needed (when ears are "stopped up"). 05/13/19  Yes [provider]  nitroGLYCERIN (NITROSTAT) 0.4 MG SL tablet Place 1 tablet (0.4 mg total) under the tongue every 5 (five) minutes as needed for chest pain. 02/14/22  Yes Nahser, Deloris Ping, MD  Polyethyl Glycol-Propyl Glycol 0.4-0.3 % SOLN Place 1 drop into both eyes in the morning and at bedtime.   Yes [provider]  potassium chloride SA (KLOR-CON  M) 20 MEQ tablet TAKE 1 TABLET EVERY DAY 05/09/23  Yes Nahser, Deloris Ping, MD  predniSONE (DELTASONE) 1 MG tablet Take 1 mg by mouth at bedtime. 05/19/23  Yes [provider]  predniSONE (DELTASONE) 5 MG tablet Take 5 mg by mouth daily with breakfast.   Yes [provider]  sodium chloride (OCEAN) 0.65 % nasal spray Place 1 spray into the nose as needed for congestion.   Yes [provider]  torsemide (DEMADEX) 20 MG tablet TAKE 1 TABLET BY MOUTH EVERY DAY 05/07/23  Yes Nahser, Deloris Ping, MD  Cyril Mourning MD. Tonny Bollman. [facility] Pager : [phone]  If no response to pager , please call [phone] until 7 pm After 7:00 pm call Elink  [phone]   09/08/2023

## 2023-09-08 NOTE — Progress Notes (Addendum)
 PHARMACY - PHYSICIAN COMMUNICATION CRITICAL VALUE ALERT - BLOOD CULTURE IDENTIFICATION (BCID)  Jeffrey Stewart is an 89 y.o. male who presented to [facility] on 09/07/2023 with a chief complaint of Influenza positive.  Assessment:   2/21: BCID 1/4 bottles growing GPC (staph epidermidis)  Name of physician (or Provider) Contacted: Lanae Boast  Current antibiotics: Ceftriaxone (2/21-  Azithro (2/21-   Changes to prescribed antibiotics recommended:  Patient is on recommended antibiotics - No changes needed (growing 1/4 bottles, repeat if suspected infection; however likely a contaminant)   Results for orders placed or performed during the hospital encounter of 09/07/23  Blood Culture ID Panel (Reflexed) (Collected: 09/07/2023  1:04 PM)  Result Value Ref Range   Enterococcus faecalis NOT DETECTED NOT DETECTED   Enterococcus Faecium NOT DETECTED NOT DETECTED   Listeria monocytogenes NOT DETECTED NOT DETECTED   Staphylococcus species DETECTED (A) NOT DETECTED   Staphylococcus aureus (BCID) NOT DETECTED NOT DETECTED   Staphylococcus epidermidis DETECTED (A) NOT DETECTED   Staphylococcus lugdunensis NOT DETECTED NOT DETECTED   Streptococcus species NOT DETECTED NOT DETECTED   Streptococcus agalactiae NOT DETECTED NOT DETECTED   Streptococcus pneumoniae NOT DETECTED NOT DETECTED   Streptococcus pyogenes NOT DETECTED NOT DETECTED   A.calcoaceticus-baumannii NOT DETECTED NOT DETECTED   Bacteroides fragilis NOT DETECTED NOT DETECTED   Enterobacterales NOT DETECTED NOT DETECTED   Enterobacter cloacae complex NOT DETECTED NOT DETECTED   Escherichia coli NOT DETECTED NOT DETECTED   Klebsiella aerogenes NOT DETECTED NOT DETECTED   Klebsiella oxytoca NOT DETECTED NOT DETECTED   Klebsiella pneumoniae NOT DETECTED NOT DETECTED   Proteus species NOT DETECTED NOT DETECTED   Salmonella species NOT DETECTED NOT DETECTED   Serratia marcescens NOT DETECTED NOT DETECTED   Haemophilus influenzae NOT  DETECTED NOT DETECTED   Neisseria meningitidis NOT DETECTED NOT DETECTED   Pseudomonas aeruginosa NOT DETECTED NOT DETECTED   Stenotrophomonas maltophilia NOT DETECTED NOT DETECTED   Candida albicans NOT DETECTED NOT DETECTED   Candida auris NOT DETECTED NOT DETECTED   Candida glabrata NOT DETECTED NOT DETECTED   Candida krusei NOT DETECTED NOT DETECTED   Candida parapsilosis NOT DETECTED NOT DETECTED   Candida tropicalis NOT DETECTED NOT DETECTED   Cryptococcus neoformans/gattii NOT DETECTED NOT DETECTED   Methicillin resistance mecA/C NOT DETECTED NOT DETECTED    [name] 09/08/2023  6:48 PM

## 2023-09-08 NOTE — Plan of Care (Signed)

## 2023-09-09 DIAGNOSIS — I5022 Chronic systolic (congestive) heart failure: Secondary | ICD-10-CM

## 2023-09-09 LAB — CULTURE, BLOOD (ROUTINE X 2)

## 2023-09-09 LAB — CBC
HCT: 32.7 % — ABNORMAL LOW (ref 39.0–52.0)
Hemoglobin: 10.6 g/dL — ABNORMAL LOW (ref 13.0–17.0)
MCH: 35.3 pg — ABNORMAL HIGH (ref 26.0–34.0)
MCHC: 32.4 g/dL (ref 30.0–36.0)
MCV: 109 fL — ABNORMAL HIGH (ref 80.0–100.0)
Platelets: 115 10*3/uL — ABNORMAL LOW (ref 150–400)
RBC: 3 MIL/uL — ABNORMAL LOW (ref 4.22–5.81)
RDW: 17.3 % — ABNORMAL HIGH (ref 11.5–15.5)
WBC: 8.2 10*3/uL (ref 4.0–10.5)
nRBC: 0 % (ref 0.0–0.2)

## 2023-09-09 LAB — VITAMIN B12: Vitamin B-12: 1795 pg/mL — ABNORMAL HIGH (ref 180–914)

## 2023-09-09 LAB — FOLATE: Folate: >40 ng/mL (ref >5.9)

## 2023-09-09 LAB — BASIC METABOLIC PANEL
Anion gap: 8 (ref 5–15)
BUN: 51 mg/dL — ABNORMAL HIGH (ref 8–23)
CO2: 30 mmol/L (ref 22–32)
Calcium: 8.4 mg/dL — ABNORMAL LOW (ref 8.9–10.3)
Chloride: 101 mmol/L (ref 98–111)
Creatinine, Ser: 1.12 mg/dL (ref 0.61–1.24)
GFR, Estimated: >60 mL/min (ref >60)
Glucose, Bld: 172 mg/dL — ABNORMAL HIGH (ref 70–99)
Potassium: 4.1 mmol/L (ref 3.5–5.1)
Sodium: 139 mmol/L (ref 135–145)

## 2023-09-09 LAB — LEGIONELLA PNEUMOPHILA SEROGP 1 UR AG: L. pneumophila Serogp 1 Ur Ag: NEGATIVE

## 2023-09-09 MED ORDER — TORSEMIDE 20 MG PO TABS
20.0000 mg | ORAL_TABLET | Freq: Every day | ORAL | Status: DC
  Administered 2023-09-09 – 2023-09-11 (×3): 20 mg via ORAL
  Filled 2023-09-09 (×3): qty 1

## 2023-09-09 NOTE — Progress Notes (Signed)
 Mobility Specialist - Progress Note   09/09/23 1424  Mobility  Activity Ambulated with assistance in hallway  Level of Assistance Standby assist, set-up cues, supervision of patient - no hands on  Assistive Device Front wheel walker  Distance Ambulated (ft) 275 ft  Activity Response Tolerated well  Mobility Referral Yes  Mobility visit 1 Mobility  Mobility Specialist Start Time (ACUTE ONLY) 1407  Mobility Specialist Stop Time (ACUTE ONLY) 1423  Mobility Specialist Time Calculation (min) (ACUTE ONLY) 16 min   Pt received in bed and agreeable to mobility. No complaints during session. Unable to check SpO2 during ambulation. Pt to bed after session with all needs met.   [facility]

## 2023-09-09 NOTE — Progress Notes (Signed)
 PROGRESS NOTE ORVAN PAPADAKIS  ZOX:[id number] DOB: Jun 08, 1935 DOA: 09/07/2023 PCP: Fredrich Romans, FNP  Brief Narrative/Hospital Course: 89 year old male with history of interstitial lung disease/abnormal CT scan, COVID-19 in 2022, polymyalgia rheumatica/seronegative rheumatoid arthritis on chronic prednisone, chronic CHF, CAD, HTN presented with worsening cough with yellow sputum and worsening shortness of breath from baseline without chest pain palpitation. Patient was seen by primary care office the day of admission for above symptoms was hypoxic 84% on room air and transferred to ED.  Workup with influenza positive. In the ED: Vitals showed hypoxia needing 4 L nasal cannula tachypneic around 30/min, VBG with pH 7.4 pCO2 42 pO2 less than 31, CMP showed total bili 1.4 creatinine 1.5 CBC with leukocytosis Lactic acid 2.6 on recheck 1.9. Chest x-ray>> minimal bibasilar subsegmental atelectasis or possibly infiltrates.  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  Also immunosuppressed. PCCM consulted input appreciated.  Lungs sound great with given Lasix IV, BNP  77 reassuring. Clinically improving wheezing is less  pronounced. Follow-up sputum culture.  Blood culture NGTD Continue empiric ceftriaxone/doxy, IV Solu-Medrol, Tamiflu along with Pulmicort, Brovana prn albuterol Resume home torsemide. Hold CellCept 2 to 3 days for now  AKI: Baseline creatinine 0.25 June 2023 on admission 1.5-1.6, improved.Avoid nephrotoxic medication encourage oral hydration   Polymyalgia rheumatica/seronegative rheumatoid arthritis on chronic prednisone; On steroid per #1  HLD HTN CAD: BP soft.  Continue home aspirin and statin.   Chronic diastolic CHF X-ray with bibasilar atelectasis and small effusion: Patient IV Lasix 2/22 will continue on home torsemide low-dose Coreg as BP allows  Macrocytic anemia: Hemoglobin around 10, check B12 folate  DVT prophylaxis: enoxaparin (LOVENOX) injection 40 mg Start: 09/08/23 0800 SCDs Start: 09/07/23 1525 Code Status:   Code Status: Full Code Family Communication: plan of care discussed with patient at bedside. Patient status is: Remains hospitalized because of severity of illness Level of care: Telemetry   Dispo: The patient is from: Home with wife            Anticipated disposition: TBD Objective: Vitals last 24 hrs: Vitals:   09/08/23 1607 09/08/23 2232 09/09/23 0610 09/09/23 0756  BP:  (!) 102/55 (!) 105/55   Pulse:  76 73   Resp:  18 18   Temp:  98 F (36.7 C) 98 F (36.7 C)   TempSrc:      SpO2: 97% 98% 94% 95%   Weight change:   Physical Examination: General exam: alert awake, oriented at baseline, older than stated age HEENT:Oral mucosa moist, Ear/Nose WNL grossly Respiratory system: Bilaterally air entry present with expiratory wheezing,  Cardiovascular system: S1 & S2 +, No JVD. Gastrointestinal system: Abdomen soft,NT,ND, BS+ Nervous System: Alert, awake, moving all extremities,and following commands. Extremities: LE edema +,distal peripheral pulses palpable  and warm.  Skin: No rashes,no icterus. MSK: Normal muscle bulk,tone, power    Medications reviewed:  Scheduled Meds:  arformoterol  15 mcg Nebulization BID   aspirin EC  81 mg Oral QPM   atorvastatin  40 mg Oral QHS   brimonidine  1 drop Both Eyes BID   budesonide (PULMICORT) nebulizer solution  0.5 mg Nebulization BID   carvedilol  3.125 mg Oral BID   cholecalciferol  1,000 Units Oral Daily   enoxaparin (LOVENOX) injection  40 mg Subcutaneous Q24H   fluticasone  1-2 spray Each Nare Daily   furosemide  60 mg Intravenous Daily   ipratropium-albuterol  3 mL Nebulization Q4H   latanoprost  1 drop Both Eyes QHS   loratadine  10 mg Oral Daily   methylPREDNISolone (SOLU-MEDROL) injection  40 mg Intravenous Q12H   minocycline  50 mg Oral BID   omega-3 acid ethyl esters  1 g Oral BID   oseltamivir  30 mg Oral BID   polyvinyl alcohol  1 drop Both Eyes BID   sodium chloride flush  3 mL Intravenous Q12H   Continuous Infusions:  azithromycin Stopped (09/08/23 1334)   cefTRIAXone (ROCEPHIN)  IV Stopped (09/08/23 1456)    Diet Order             Diet 2 gram sodium Room service appropriate? Yes; Fluid consistency: Thin  Diet effective now                  Intake/Output Summary (Last 24 hours) at 09/09/2023 0823 Last data filed at 09/09/2023 0100 Gross per 24 hour  Intake 590 ml  Output 1100 ml  Net -510 ml   Net IO Since Admission: -563.71 mL [09/09/23 0823]  Wt Readings from Last 3 Encounters:  07/05/23 95.1 kg  07/05/23 93.5 kg  06/25/23 92.6 kg     Unresulted Labs (From admission, onward)     Start     Ordered   09/14/23 0500  Creatinine, serum  (enoxaparin (LOVENOX)    CrCl >/= 30 ml/min)  Weekly,   R     Comments: while on enoxaparin therapy    09/07/23 1525   09/08/23 0958  C Difficile Quick Screen w PCR reflex  (C Difficile quick screen w PCR reflex panel )  Once, for 24 hours,   TIMED       References:    CDiff Information Tool   09/08/23 0958   09/08/23 0958  Expectorated Sputum Assessment w Gram Stain, Rflx to Resp Cult  Once,   R         09/08/23 0958   09/07/23 1524  Legionella Pneumophila Serogp 1 Ur Ag  Once,   URGENT        09/07/23 1523          Data Reviewed: I have personally reviewed following labs and imaging studies CBC: Recent Labs  Lab 09/07/23 1250 09/07/23 1947 09/08/23 0733 09/09/23 0601  WBC 14.4* 15.7* 13.5* 8.2  NEUTROABS 13.2*  --   --   --   HGB 13.1 12.0* 11.0* 10.6*  HCT 40.6 37.6* 34.1* 32.7*  MCV 109.1* 109.9* 110.4* 109.0*  PLT 149* 132* 125* 115*   Basic Metabolic Panel:  Recent Labs  Lab 09/07/23 1250 09/07/23 1947 09/08/23 0733 09/09/23 0601  NA 138  --  137 139  K 3.9  --  4.0 4.1  CL 99  --  100 101  CO2 25  --  28 30  GLUCOSE 171*  --  154* 172*  BUN 41*  --  49* 51*  CREATININE 1.56* 1.63* 1.29* 1.12  CALCIUM 8.5*  --  8.3* 8.4*   GFR: CrCl cannot be calculated (Unknown ideal weight.). Liver Function Tests:  Recent Labs  Lab 09/07/23 1250  AST 24  ALT 15  ALKPHOS 42  BILITOT 1.4*  PROT 6.5  ALBUMIN 3.4*   No results for input(s): "LIPASE", "AMYLASE" in the last 168 hours. No results for input(s): "AMMONIA" in the last 168 hours. Coagulation Profile:  Recent Labs  Lab 09/07/23 1250 09/08/23 0733  INR 1.1 1.1   Sepsis Labs: Recent Labs  Lab 09/07/23 1251 09/07/23 1432 09/08/23 0733  PROCALCITON  --   --  6.83  LATICACIDVEN 2.6* 1.9  --    Recent Results (from the past 240 hours)  Expectorated Sputum Assessment w Gram Stain, Rflx to Resp Cult     Status: None   Collection Time: 09/07/23  6:15 AM   Specimen: Expectorated Sputum  Result Value Ref Range Status   Specimen Description EXPECTORATED SPUTUM  Final   Special Requests NONE  Final   Sputum evaluation   Final    THIS SPECIMEN IS ACCEPTABLE FOR SPUTUM CULTURE Performed at [facility], [address], [place], [place] [address]    Report Status 09/08/2023 FINAL  Final  Culture, Respiratory w Gram Stain     Status: None (Preliminary result)   Collection Time: 09/07/23   6:15 AM  Result Value Ref Range Status   Specimen Description   Final    EXPECTORATED SPUTUM Performed at [facility], [address], [place], [place] [address]    Special Requests   Final    NONE Reflexed from [id number] Performed at [facility], [address], [place], [place] [address]    Gram Stain   Final    ABUNDANT WBC PRESENT, PREDOMINANTLY PMN FEW SQUAMOUS EPITHELIAL CELLS PRESENT ABUNDANT GRAM POSITIVE COCCI RARE GRAM NEGATIVE RODS Performed at [facility], [address] [place], [place] [address]    Culture PENDING  Incomplete   Report Status PENDING  Incomplete  Blood Culture (routine x 2)     Status: None (Preliminary result)   Collection Time: 09/07/23 12:51 PM   Specimen: BLOOD  Result Value Ref Range Status   Specimen Description   Final    BLOOD RIGHT ANTECUBITAL Performed at [facility], [address], [place], [place] [address]    Special Requests   Final    BOTTLES DRAWN AEROBIC AND ANAEROBIC Blood Culture adequate volume Performed at [facility], [address] [place], [place] [address]    Culture   Final    NO GROWTH < 24 HOURS Performed at [facility], [address] [place], [place] [address]    Report Status PENDING  Incomplete  Blood Culture (routine x 2)     Status: None (Preliminary result)   Collection Time: 09/07/23  1:04 PM   Specimen: BLOOD  Result Value Ref Range Status   Specimen Description   Final    BLOOD Performed at [facility], [address], [place], [place] [address]    Special Requests   Final    BOTTLES DRAWN AEROBIC AND ANAEROBIC Blood Culture results may not be optimal due to an inadequate volume of blood received in culture bottles Performed at [facility], [address] [place], [place] [address]    Culture  Setup Time   Final    GRAM POSITIVE COCCI AEROBIC BOTTLE ONLY CRITICAL  RESULT CALLED TO, READ BACK BY AND VERIFIED WITH: PHARMD Winferd Humphrey [id number] BY Berline Chough, MT Performed at [facility], [address], [place], [place] [address]    Culture GRAM POSITIVE COCCI  Final   Report Status PENDING  Incomplete  Blood Culture ID Panel (Reflexed)     Status: Abnormal   Collection Time: 09/07/23  1:04 PM  Result Value Ref Range Status   Enterococcus faecalis NOT DETECTED NOT DETECTED Final   Enterococcus Faecium NOT DETECTED NOT DETECTED Final   Listeria monocytogenes NOT DETECTED NOT DETECTED Final   Staphylococcus species DETECTED (A) NOT DETECTED Final    Comment: CRITICAL RESULT CALLED TO, READ BACK BY AND VERIFIED WITH: PHARMD MORGAN GRAVLEY [phone] 1806 BY J RAZZAK, MT    Staphylococcus aureus (BCID) NOT DETECTED NOT DETECTED Final   Staphylococcus epidermidis DETECTED (A) NOT DETECTED Final    Comment: CRITICAL RESULT CALLED TO, READ BACK BY AND VERIFIED WITH: PHARMD MORGAN GRAVLEY [phone] 1806 BY J RAZZAK, MT    Staphylococcus lugdunensis NOT DETECTED NOT DETECTED Final   Streptococcus species NOT DETECTED NOT DETECTED Final   Streptococcus agalactiae NOT DETECTED NOT DETECTED Final   Streptococcus pneumoniae NOT DETECTED NOT DETECTED Final   Streptococcus pyogenes NOT DETECTED NOT DETECTED Final   A.calcoaceticus-baumannii NOT DETECTED NOT DETECTED Final   Bacteroides fragilis NOT DETECTED NOT DETECTED Final   Enterobacterales NOT DETECTED NOT DETECTED Final   Enterobacter cloacae complex NOT DETECTED NOT DETECTED Final   Escherichia coli NOT DETECTED NOT DETECTED Final   Klebsiella aerogenes NOT DETECTED NOT DETECTED Final   Klebsiella oxytoca NOT DETECTED NOT DETECTED Final   Klebsiella pneumoniae NOT DETECTED NOT DETECTED Final   Proteus species NOT DETECTED NOT DETECTED Final   Salmonella species NOT DETECTED NOT DETECTED Final   Serratia marcescens NOT DETECTED NOT DETECTED Final   Haemophilus influenzae NOT DETECTED NOT  DETECTED Final   Neisseria meningitidis NOT DETECTED NOT DETECTED Final   Pseudomonas aeruginosa NOT DETECTED NOT DETECTED Final   Stenotrophomonas maltophilia NOT DETECTED NOT DETECTED Final   Candida albicans NOT DETECTED NOT DETECTED Final   Candida auris NOT DETECTED NOT DETECTED Final   Candida glabrata NOT DETECTED NOT DETECTED Final   Candida krusei NOT DETECTED NOT DETECTED Final   Candida parapsilosis NOT DETECTED NOT DETECTED Final   Candida tropicalis NOT DETECTED NOT DETECTED Final   Cryptococcus neoformans/gattii NOT DETECTED NOT DETECTED Final   Methicillin resistance mecA/C NOT DETECTED NOT DETECTED Final    Comment: Performed at [facility], [address] [place], [place] [address]  SARS Coronavirus 2 by RT PCR (hospital order, performed in [facility] hospital lab) *cepheid single result test* Anterior Nasal Swab     Status: None   Collection Time: 09/07/23  5:03 PM   Specimen: Anterior Nasal Swab  Result Value Ref Range Status   SARS Coronavirus 2 by RT PCR NEGATIVE NEGATIVE Final    Comment: (NOTE) SARS-CoV-2 target nucleic acids are NOT DETECTED.  The SARS-CoV-2 RNA is generally detectable in upper and lower respiratory specimens during the acute phase of infection. The lowest concentration of SARS-CoV-2 viral copies this assay can detect is 250 copies / mL. A negative result does not preclude SARS-CoV-2 infection and should not be used as the sole basis for treatment or other patient management decisions.  A negative result may occur with improper  specimen collection / handling, submission of specimen other than nasopharyngeal swab, presence of viral mutation(s) within the areas targeted by this assay, and inadequate number of viral copies (<250 copies / mL). A negative result must be combined with clinical observations, patient history, and epidemiological information.  Fact Sheet for Patients:   RoadLapTop.co.za  Fact Sheet  for Healthcare Providers: http://kim-miller.com/  This test is not yet approved or  cleared by the [place] FDA and has been authorized for detection and/or diagnosis of SARS-CoV-2 by FDA under an Emergency Use Authorization (EUA).  This EUA will remain in effect (meaning this test can be used) for the duration of the COVID-19 declaration under Section 564(b)(1) of the Act, 21 U.S.C. section 360bbb-3(b)(1), unless the authorization is terminated or revoked sooner.  Performed at [facility], [address] [place], [place] [address]     Antimicrobials/Microbiology: Anti-infectives (From admission, onward)    Start     Dose/Rate Route Frequency Ordered Stop   09/08/23 1200  cefTRIAXone (ROCEPHIN) 2 g in sodium chloride 0.9 % 100 mL IVPB        2 g 200 mL/hr over 30 Minutes Intravenous Every 24 hours 09/07/23 1522     09/08/23 1200  azithromycin (ZITHROMAX) 500 mg in sodium chloride 0.9 % 250 mL IVPB        500 mg 250 mL/hr over 60 Minutes Intravenous Every 24 hours 09/07/23 1522 09/11/23 1159   09/07/23 2200  minocycline (MINOCIN) capsule 50 mg        50 mg Oral 2 times daily 09/07/23 1808     09/07/23 2200  oseltamivir (TAMIFLU) capsule 30 mg        30 mg Oral 2 times daily 09/07/23 2119 09/12/23 2159   09/07/23 1530  oseltamivir (TAMIFLU) capsule 75 mg  Status:  Discontinued        75 mg Oral 2 times daily 09/07/23 1522 09/07/23 2119   09/07/23 1400  cefTRIAXone (ROCEPHIN) 2 g in sodium chloride 0.9 % 100 mL IVPB        2 g 200 mL/hr over 30 Minutes Intravenous Once 09/07/23 1348 09/07/23 1419   09/07/23 1400  azithromycin (ZITHROMAX) 500 mg in sodium chloride 0.9 % 250 mL IVPB        500 mg 250 mL/hr over 60 Minutes Intravenous  Once 09/07/23 1348 09/07/23 1518         Component Value Date/Time   SDES  09/07/2023 1304    BLOOD Performed at [facility], [address], [place], [place] [address]     SPECREQUEST  09/07/2023 1304    BOTTLES DRAWN AEROBIC AND ANAEROBIC Blood Culture results may not be optimal due to an inadequate volume of blood received in culture bottles Performed at [facility], [address], [place], [place] [address]    Elpidio Anis POSITIVE COCCI 09/07/2023 1304   REPTSTATUS PENDING 09/07/2023 1304    Radiology Studies: [facility] Chest Port 1 View Result Date: 09/08/2023 CLINICAL DATA:  Short of breath EXAM: PORTABLE CHEST 1 VIEW COMPARISON:  Radiograph 09/07/2023, CT 06/04/2023 FINDINGS: Mildly enlarged cardiac silhouette. Bilateral pleural effusions and mild basilar atelectasis. Scarring in the lateral aspect of the RIGHT upper lobe is again noted. Cannot exclude RIGHT basilar infiltrate. IMPRESSION: 1. Bibasilar atelectasis and small effusions. 2. RIGHT lower lobe atelectasis versus infiltrate. 3. Scarring in the mid RIGHT lateral lung. Electronically Signed   By: Genevive Bi M.D.   On: 09/08/2023 11:29  DG Chest Port 1 View Result Date: 09/07/2023 CLINICAL DATA:  Shortness of breath. EXAM: PORTABLE CHEST 1 VIEW COMPARISON:  June 19, 2022. FINDINGS: Stable cardiomediastinal silhouette. Minimal bibasilar subsegmental atelectasis or possibly infiltrates are noted. Bony thorax is unremarkable. IMPRESSION: Minimal bibasilar subsegmental atelectasis or possibly infiltrates. Electronically Signed   By: Lupita Raider M.D.   On: 09/07/2023 14:07   LOS: 2 days   Total time spent in review of labs and imaging, patient evaluation, formulation of plan, documentation and communication with family: 50  minutes  Lanae Boast, MD  [facility]  09/09/2023, 8:23 AM

## 2023-09-09 NOTE — Plan of Care (Signed)

## 2023-09-09 NOTE — Plan of Care (Signed)
  Problem: Education: Goal: Knowledge of General Education information will improve Description: Including pain rating scale, medication(s)/side effects and non-pharmacologic comfort measures Outcome: Progressing   Problem: Clinical Measurements: Goal: Ability to maintain clinical measurements within normal limits will improve Outcome: Progressing Goal: Will remain free from infection Outcome: Progressing Goal: Respiratory complications will improve Outcome: Progressing   Problem: Activity: Goal: Risk for activity intolerance will decrease Outcome: Progressing   Problem: Elimination: Goal: Will not experience complications related to urinary retention Outcome: Progressing   Problem: Safety: Goal: Ability to remain free from injury will improve Outcome: Progressing   Problem: Skin Integrity: Goal: Risk for impaired skin integrity will decrease Outcome: Progressing

## 2023-09-10 DIAGNOSIS — J189 Pneumonia, unspecified organism: Secondary | ICD-10-CM | POA: Diagnosis not present

## 2023-09-10 LAB — CBC
HCT: 34.6 % — ABNORMAL LOW (ref 39.0–52.0)
Hemoglobin: 11.1 g/dL — ABNORMAL LOW (ref 13.0–17.0)
MCH: 35.6 pg — ABNORMAL HIGH (ref 26.0–34.0)
MCHC: 32.1 g/dL (ref 30.0–36.0)
MCV: 110.9 fL — ABNORMAL HIGH (ref 80.0–100.0)
Platelets: 135 10*3/uL — ABNORMAL LOW (ref 150–400)
RBC: 3.12 MIL/uL — ABNORMAL LOW (ref 4.22–5.81)
RDW: 16.9 % — ABNORMAL HIGH (ref 11.5–15.5)
WBC: 7.9 10*3/uL (ref 4.0–10.5)
nRBC: 0 % (ref 0.0–0.2)

## 2023-09-10 LAB — BASIC METABOLIC PANEL
Anion gap: 12 (ref 5–15)
BUN: 52 mg/dL — ABNORMAL HIGH (ref 8–23)
CO2: 26 mmol/L (ref 22–32)
Calcium: 8.6 mg/dL — ABNORMAL LOW (ref 8.9–10.3)
Chloride: 100 mmol/L (ref 98–111)
Creatinine, Ser: 1.15 mg/dL (ref 0.61–1.24)
GFR, Estimated: >60 mL/min (ref >60)
Glucose, Bld: 204 mg/dL — ABNORMAL HIGH (ref 70–99)
Potassium: 3.6 mmol/L (ref 3.5–5.1)
Sodium: 138 mmol/L (ref 135–145)

## 2023-09-10 MED ORDER — IPRATROPIUM-ALBUTEROL 0.5-2.5 (3) MG/3ML IN SOLN
3.0000 mL | Freq: Four times a day (QID) | RESPIRATORY_TRACT | Status: DC
  Administered 2023-09-11: 3 mL via RESPIRATORY_TRACT
  Filled 2023-09-10 (×2): qty 3

## 2023-09-10 NOTE — Plan of Care (Signed)
 VSS. Patient on 3L [place] with O2 sat >94%. Voiding via urinal. No BM overnight. Patient c/o headache, given PRN Tylenol. No acute events overnight.  Problem: Education: Goal: Knowledge of General Education information will improve Description: Including pain rating scale, medication(s)/side effects and non-pharmacologic comfort measures Outcome: Progressing   Problem: Clinical Measurements: Goal: Ability to maintain clinical measurements within normal limits will improve Outcome: Progressing Goal: Will remain free from infection Outcome: Progressing Goal: Respiratory complications will improve Outcome: Progressing   Problem: Activity: Goal: Risk for activity intolerance will decrease Outcome: Progressing   Problem: Pain Managment: Goal: General experience of comfort will improve and/or be controlled Outcome: Progressing   Problem: Safety: Goal: Ability to remain free from injury will improve Outcome: Progressing   Problem: Skin Integrity: Goal: Risk for impaired skin integrity will decrease Outcome: Progressing

## 2023-09-10 NOTE — Progress Notes (Signed)
   09/10/23 1252  TOC Brief Assessment  Insurance and Status Reviewed  Patient has primary care physician Yes  Home environment has been reviewed Home w/ spouse  Prior level of function: Independent/modified independent  Prior/Current Home Services No current home services  Social Drivers of Health Review SDOH reviewed no interventions necessary  Readmission risk has been reviewed Yes  Transition of care needs transition of care needs identified, TOC will continue to follow   Following for O2 need at discharge.

## 2023-09-10 NOTE — Plan of Care (Signed)

## 2023-09-10 NOTE — Progress Notes (Signed)
 PROGRESS NOTE Jeffrey Stewart  ZOX:[id number] DOB: 17-Jan-1935 DOA: 09/07/2023 PCP: Fredrich Romans, FNP  Brief Narrative/Hospital Course: 89 year old male with history of interstitial lung disease/abnormal CT scan, COVID-19 in 2022, polymyalgia rheumatica/seronegative rheumatoid arthritis on chronic prednisone, chronic CHF, CAD, HTN presented with worsening cough with yellow sputum and worsening shortness of breath from baseline without chest pain palpitation. Patient was seen by primary care office the day of admission for above symptoms was hypoxic 84% on room air and transferred to ED.  Workup with influenza positive. In the ED: Vitals showed hypoxia needing 4 L nasal cannula tachypneic around 30/min, VBG with pH 7.4 pCO2 42 pO2 less than 31, CMP showed total bili 1.4 creatinine 1.5 CBC with leukocytosis Lactic acid 2.6 on recheck 1.9. Chest x-ray>> minimal bibasilar subsegmental atelectasis or possibly infiltrates.  Subjective:  Seen this morning.  Daughter at the bedside. 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  Also immunosuppressed. Seen by PCCM. And overall slowly improving will continue to hold CellCept for 2 to 3 days for now.  Blood culture no growth. No sputum yet for cx.Continue empiric ceftriaxone/doxy, IV Solu-Medrol, Tamiflu along with Pulmicort, Brovana prn albuterol and  home torsemide  AKI: Baseline creatinine 0.25 June 2023 on admission 1.5-1.6, improved. Cont to avoid nephrotoxic medication encourage oral hydration Recent Labs    03/27/23 1500 04/25/23 1017 07/05/23 1537 09/07/23 1250 09/07/23 1947 09/08/23 0733 09/09/23 0601  BUN 22 24* 16 41*  --  49* 51*  CREATININE 1.07 1.03 0.93 1.56* 1.63* 1.29* 1.12  CO2 32 32 27 25  --  28 30  K 4.3 4.4 4.4 3.9  --  4.0 4.1     Polymyalgia rheumatica/seronegative rheumatoid arthritis on chronic prednisone; On steroid per #1  HLD HTN CAD: BP soft. Continue home aspirin and statin.   Chronic diastolic CHF X-ray with bibasilar atelectasis and small effusion: Patient IV Lasix 2/22 will continue on home torsemide low-dose Coreg as BP allows  Macrocytic anemia Mild thrombocytopenia: Hemoglobin around 10,  pmt >100k, B12 and folate level adequate.  Monitor labs  DVT prophylaxis: enoxaparin (LOVENOX) injection 40 mg Start: 09/08/23 0800 SCDs Start: 09/07/23 1525 Code Status:   Code Status: Full Code Family Communication: plan of care discussed with patient and his daughter at bedside. Patient status is: Remains hospitalized because of severity of illness Level of care: Telemetry   Dispo: The patient is from: Home with wife            Anticipated disposition: TBD Objective: Vitals last 24 hrs: Vitals:   09/09/23 2153 09/10/23 0100 09/10/23 0412 09/10/23 0753  BP: 125/62  119/65   Pulse: 93  78   Resp:   20   Temp:   98 F (36.7 C)   TempSrc:      SpO2: 96% 96% 99% 98%   Weight change:   Physical Examination: General exam: alert awake, oriented at baseline, older  than stated age HEENT:Oral mucosa moist, Ear/Nose WNL grossly Respiratory system: Bilaterally expiratory wheezing present,no use of accessory muscle Cardiovascular system: S1 & S2 +, No JVD. Gastrointestinal system: Abdomen soft,NT,ND, BS+ Nervous System: Alert, awake, moving all extremities,and following  commands. Extremities: LE edema neg,distal peripheral pulses palpable and warm.  Skin: No rashes,no icterus. MSK: Normal muscle bulk,tone, power   Medications reviewed:  Scheduled Meds:  arformoterol  15 mcg Nebulization BID   aspirin EC  81 mg Oral QPM   atorvastatin  40 mg Oral QHS   brimonidine  1 drop Both Eyes BID   budesonide (PULMICORT) nebulizer solution  0.5 mg Nebulization BID   carvedilol  3.125 mg Oral BID   cholecalciferol  1,000 Units Oral Daily   enoxaparin (LOVENOX) injection  40 mg Subcutaneous Q24H   fluticasone  1-2 spray Each Nare Daily   ipratropium-albuterol  3 mL Nebulization Q4H   latanoprost  1 drop Both Eyes QHS   loratadine  10 mg Oral Daily   methylPREDNISolone (SOLU-MEDROL) injection  40 mg Intravenous Q12H   minocycline  50 mg Oral BID   omega-3 acid ethyl esters  1 g Oral BID   oseltamivir  30 mg Oral BID   polyvinyl alcohol  1 drop Both Eyes BID   sodium chloride flush  3 mL Intravenous Q12H   torsemide  20 mg Oral Daily   Continuous Infusions:  azithromycin Stopped (09/09/23 1435)   cefTRIAXone (ROCEPHIN)  IV Stopped (09/09/23 1350)    Diet Order             Diet 2 gram sodium Room service appropriate? Yes; Fluid consistency: Thin  Diet effective now                  Intake/Output Summary (Last 24 hours) at 09/10/2023 1138 Last data filed at 09/10/2023 0629 Gross per 24 hour  Intake 495.57 ml  Output 1400 ml  Net -904.43 ml   Net IO Since Admission: -1,965.14 mL [09/10/23 1138]  Wt Readings from Last 3 Encounters:  07/05/23 95.1 kg  07/05/23 93.5 kg  06/25/23 92.6 kg     Unresulted Labs (From admission, onward)     Start     Ordered   09/14/23 0500  Creatinine, serum  (enoxaparin (LOVENOX)    CrCl >/= 30 ml/min)  Weekly,   R     Comments: while on enoxaparin therapy    09/07/23 1525   09/10/23 0836  Basic metabolic panel  Once,   R        09/10/23 0836   09/10/23 0500  CBC  Daily,   R     Question:  Specimen collection  method  Answer:  Lab=Lab collect   09/09/23 0832   09/10/23 0500  Basic metabolic panel  Daily,   R     Question:  Specimen collection method  Answer:  Lab=Lab collect   09/09/23 0832   09/08/23 0958  Expectorated Sputum Assessment w Gram Stain, Rflx to Resp Cult  Once,   R        09/08/23 0958          Data Reviewed: I have personally reviewed following labs and imaging studies CBC: Recent Labs  Lab 09/07/23 1250 09/07/23 1947 09/08/23 0733 09/09/23 0601 09/10/23 0512  WBC 14.4* 15.7* 13.5* 8.2 7.9  NEUTROABS 13.2*  --   --   --   --   HGB 13.1 12.0* 11.0* 10.6* 11.1*  HCT 40.6 37.6* 34.1* 32.7*  34.6*  MCV 109.1* 109.9* 110.4* 109.0* 110.9*  PLT 149* 132* 125* 115* 135*   Basic Metabolic Panel:  Recent Labs  Lab 09/07/23 1250 09/07/23 1947 09/08/23 0733 09/09/23 0601  NA 138  --  137 139  K 3.9  --  4.0 4.1  CL 99  --  100 101  CO2 25  --  28 30  GLUCOSE 171*  --  154* 172*  BUN 41*  --  49* 51*  CREATININE 1.56* 1.63* 1.29* 1.12  CALCIUM 8.5*  --  8.3* 8.4*   GFR: CrCl cannot be calculated (Unknown ideal weight.). Liver Function Tests:  Recent Labs  Lab 09/07/23 1250  AST 24  ALT 15  ALKPHOS 42  BILITOT 1.4*  PROT 6.5  ALBUMIN 3.4*   No results for input(s): "LIPASE", "AMYLASE" in the last 168 hours. No results for input(s): "AMMONIA" in the last 168 hours. Coagulation Profile:  Recent Labs  Lab 09/07/23 1250 09/08/23 0733  INR 1.1 1.1   Sepsis Labs: Recent Labs  Lab 09/07/23 1251 09/07/23 1432 09/08/23 0733  PROCALCITON  --   --  6.83  LATICACIDVEN 2.6* 1.9  --    Recent Results (from the past 240 hours)  Expectorated Sputum Assessment w Gram Stain, Rflx to Resp Cult     Status: None   Collection Time: 09/07/23  6:15 AM   Specimen: Expectorated Sputum  Result Value Ref Range Status   Specimen Description EXPECTORATED SPUTUM  Final   Special Requests NONE  Final   Sputum evaluation   Final    THIS SPECIMEN IS ACCEPTABLE FOR SPUTUM  CULTURE Performed at [facility], [address], [place], [place] [address]    Report Status 09/08/2023 FINAL  Final  Culture, Respiratory w Gram Stain     Status: None (Preliminary result)   Collection Time: 09/07/23  6:15 AM  Result Value Ref Range Status   Specimen Description   Final    EXPECTORATED SPUTUM Performed at [facility], [address] [place], [place] [address]    Special Requests   Final    NONE Reflexed from [id number] Performed at [facility], [address], [place], [place] [address]    Gram Stain   Final    ABUNDANT WBC PRESENT, PREDOMINANTLY PMN FEW SQUAMOUS EPITHELIAL CELLS PRESENT ABUNDANT GRAM POSITIVE COCCI RARE GRAM NEGATIVE RODS    Culture   Final    ABUNDANT STREPTOCOCCUS PNEUMONIAE SUSCEPTIBILITIES TO FOLLOW Performed at [facility], [address] [place], [place] [address]    Report Status PENDING  Incomplete  Blood Culture (routine x 2)     Status: None (Preliminary result)   Collection Time: 09/07/23 12:51 PM   Specimen: BLOOD  Result Value Ref Range Status   Specimen Description   Final    BLOOD RIGHT ANTECUBITAL Performed at [facility], [address], [place], [place] [address]    Special Requests   Final    BOTTLES DRAWN AEROBIC AND ANAEROBIC Blood Culture adequate volume Performed at [facility], [address], [place], [place] [address]    Culture   Final    NO GROWTH 3 DAYS Performed at [facility], [address] [place], [place] [address]    Report Status PENDING  Incomplete  Blood Culture (routine x 2)     Status: Abnormal   Collection Time: 09/07/23  1:04 PM   Specimen: BLOOD  Result Value  Ref Range Status   Specimen Description   Final    BLOOD Performed at [facility], [address], [place], [place] [address]    Special Requests   Final    BOTTLES DRAWN AEROBIC AND ANAEROBIC Blood  Culture results may not be optimal due to an inadequate volume of blood received in culture bottles Performed at [facility], [address], [place], [place] [address]    Culture  Setup Time   Final    GRAM POSITIVE COCCI AEROBIC BOTTLE ONLY CRITICAL RESULT CALLED TO, READ BACK BY AND VERIFIED WITH: PHARMD MORGAN GRAVLEY [phone] 1806 BY J RAZZAK, MT    Culture (A)  Final    STAPHYLOCOCCUS EPIDERMIDIS THE SIGNIFICANCE OF ISOLATING THIS ORGANISM FROM A SINGLE SET OF BLOOD CULTURES WHEN MULTIPLE SETS ARE DRAWN IS UNCERTAIN. PLEASE NOTIFY THE MICROBIOLOGY DEPARTMENT WITHIN ONE WEEK IF SPECIATION AND SENSITIVITIES ARE REQUIRED. Performed at [facility], [address] [place], [place] [address]    Report Status 09/09/2023 FINAL  Final  Blood Culture ID Panel (Reflexed)     Status: Abnormal   Collection Time: 09/07/23  1:04 PM  Result Value Ref Range Status   Enterococcus faecalis NOT DETECTED NOT DETECTED Final   Enterococcus Faecium NOT DETECTED NOT DETECTED Final   Listeria monocytogenes NOT DETECTED NOT DETECTED Final   Staphylococcus species DETECTED (A) NOT DETECTED Final    Comment: CRITICAL RESULT CALLED TO, READ BACK BY AND VERIFIED WITH: PHARMD MORGAN GRAVLEY [phone] 1806 BY J RAZZAK, MT    Staphylococcus aureus (BCID) NOT DETECTED NOT DETECTED Final   Staphylococcus epidermidis DETECTED (A) NOT DETECTED Final    Comment: CRITICAL RESULT CALLED TO, READ BACK BY AND VERIFIED WITH: PHARMD MORGAN GRAVLEY [phone] 1806 BY J RAZZAK, MT    Staphylococcus lugdunensis NOT DETECTED NOT DETECTED Final   Streptococcus species NOT DETECTED NOT DETECTED Final   Streptococcus agalactiae NOT DETECTED NOT DETECTED Final   Streptococcus pneumoniae NOT DETECTED NOT DETECTED Final   Streptococcus pyogenes NOT DETECTED NOT DETECTED Final   A.calcoaceticus-baumannii NOT DETECTED NOT DETECTED Final   Bacteroides fragilis NOT DETECTED NOT DETECTED Final    Enterobacterales NOT DETECTED NOT DETECTED Final   Enterobacter cloacae complex NOT DETECTED NOT DETECTED Final   Escherichia coli NOT DETECTED NOT DETECTED Final   Klebsiella aerogenes NOT DETECTED NOT DETECTED Final   Klebsiella oxytoca NOT DETECTED NOT DETECTED Final   Klebsiella pneumoniae NOT DETECTED NOT DETECTED Final   Proteus species NOT DETECTED NOT DETECTED Final   Salmonella species NOT DETECTED NOT DETECTED Final   Serratia marcescens NOT DETECTED NOT DETECTED Final   Haemophilus influenzae NOT DETECTED NOT DETECTED Final   Neisseria meningitidis NOT DETECTED NOT DETECTED Final   Pseudomonas aeruginosa NOT DETECTED NOT DETECTED Final   Stenotrophomonas maltophilia NOT DETECTED NOT DETECTED Final   Candida albicans NOT DETECTED NOT DETECTED Final   Candida auris NOT DETECTED NOT DETECTED Final   Candida glabrata NOT DETECTED NOT DETECTED Final   Candida krusei NOT DETECTED NOT DETECTED Final   Candida parapsilosis NOT DETECTED NOT DETECTED Final   Candida tropicalis NOT DETECTED NOT DETECTED Final   Cryptococcus neoformans/gattii NOT DETECTED NOT DETECTED Final   Methicillin resistance mecA/C NOT DETECTED NOT DETECTED Final    Comment: Performed at [facility], [address], [place], [place] [address]  SARS Coronavirus 2 by RT PCR (hospital order, performed in [facility] hospital lab) *cepheid single result test* Anterior  Nasal Swab     Status: None   Collection Time: 09/07/23  5:03 PM   Specimen: Anterior Nasal Swab  Result Value Ref Range Status   SARS Coronavirus 2 by RT PCR NEGATIVE NEGATIVE Final    Comment: (NOTE) SARS-CoV-2 target nucleic acids are NOT DETECTED.  The SARS-CoV-2 RNA is generally detectable in upper and lower respiratory specimens during the acute phase of infection. The lowest concentration of SARS-CoV-2 viral copies this assay can detect is 250 copies / mL. A negative result does not preclude SARS-CoV-2 infection and should not be used  as the sole basis for treatment or other patient management decisions.  A negative result may occur with improper specimen collection / handling, submission of specimen other than nasopharyngeal swab, presence of viral mutation(s) within the areas targeted by this assay, and inadequate number of viral copies (<250 copies / mL). A negative result must be combined with clinical observations, patient history, and epidemiological information.  Fact Sheet for Patients:   RoadLapTop.co.za  Fact Sheet for Healthcare Providers: http://kim-miller.com/  This test is not yet approved or  cleared by the [place] FDA and has been authorized for detection and/or diagnosis of SARS-CoV-2 by FDA under an Emergency Use Authorization (EUA).  This EUA will remain in effect (meaning this test can be used) for the duration of the COVID-19 declaration under Section 564(b)(1) of the Act, 21 U.S.C. section 360bbb-3(b)(1), unless the authorization is terminated or revoked sooner.  Performed at [facility], [address], [place], [place] [address]     Antimicrobials/Microbiology: Anti-infectives (From admission, onward)    Start     Dose/Rate Route Frequency Ordered Stop   09/08/23 1200  cefTRIAXone (ROCEPHIN) 2 g in sodium chloride 0.9 % 100 mL IVPB        2 g 200 mL/hr over 30 Minutes Intravenous Every 24 hours 09/07/23 1522     09/08/23 1200  azithromycin (ZITHROMAX) 500 mg in sodium chloride 0.9 % 250 mL IVPB        500 mg 250 mL/hr over 60 Minutes Intravenous Every 24 hours 09/07/23 1522 09/11/23 1159   09/07/23 2200  minocycline (MINOCIN) capsule 50 mg        50 mg Oral 2 times daily 09/07/23 1808     09/07/23 2200  oseltamivir (TAMIFLU) capsule 30 mg        30 mg Oral 2 times daily 09/07/23 2119 09/12/23 2159   09/07/23 1530  oseltamivir (TAMIFLU) capsule 75 mg  Status:  Discontinued        75 mg Oral 2 times daily 09/07/23  1522 09/07/23 2119   09/07/23 1400  cefTRIAXone (ROCEPHIN) 2 g in sodium chloride 0.9 % 100 mL IVPB        2 g 200 mL/hr over 30 Minutes Intravenous Once 09/07/23 1348 09/07/23 1419   09/07/23 1400  azithromycin (ZITHROMAX) 500 mg in sodium chloride 0.9 % 250 mL IVPB        500 mg 250 mL/hr over 60 Minutes Intravenous  Once 09/07/23 1348 09/07/23 1518         Component Value Date/Time   SDES  09/07/2023 1304    BLOOD Performed at [facility], [address], [place], [place] [address]    SPECREQUEST  09/07/2023 1304    BOTTLES DRAWN AEROBIC AND ANAEROBIC Blood Culture results may not be optimal due to an inadequate volume of blood received in culture bottles Performed at [facility], [address] [place], [place] [address]    CULT (A) 09/07/2023 1304    STAPHYLOCOCCUS EPIDERMIDIS THE SIGNIFICANCE OF ISOLATING THIS ORGANISM FROM A SINGLE SET OF BLOOD CULTURES WHEN MULTIPLE SETS ARE DRAWN IS UNCERTAIN. PLEASE NOTIFY THE MICROBIOLOGY DEPARTMENT WITHIN ONE WEEK IF SPECIATION AND SENSITIVITIES ARE REQUIRED. Performed at [facility], [address], [place], [place] [address]    REPTSTATUS 09/09/2023 FINAL 09/07/2023 1304    Radiology Studies: No results found.  LOS: 3 days   Total time spent in review of labs and imaging, patient evaluation, formulation of plan, documentation and communication with family: 50  minutes  Lanae Boast, MD  [facility]  09/10/2023, 11:38 AM

## 2023-09-11 ENCOUNTER — Other Ambulatory Visit (HOSPITAL_COMMUNITY): Payer: Self-pay

## 2023-09-11 DIAGNOSIS — N179 Acute kidney failure, unspecified: Secondary | ICD-10-CM | POA: Diagnosis not present

## 2023-09-11 LAB — BASIC METABOLIC PANEL
Anion gap: 12 (ref 5–15)
BUN: 50 mg/dL — ABNORMAL HIGH (ref 8–23)
CO2: 28 mmol/L (ref 22–32)
Calcium: 8.7 mg/dL — ABNORMAL LOW (ref 8.9–10.3)
Chloride: 101 mmol/L (ref 98–111)
Creatinine, Ser: 1.01 mg/dL (ref 0.61–1.24)
GFR, Estimated: >60 mL/min (ref >60)
Glucose, Bld: 181 mg/dL — ABNORMAL HIGH (ref 70–99)
Potassium: 3.9 mmol/L (ref 3.5–5.1)
Sodium: 141 mmol/L (ref 135–145)

## 2023-09-11 LAB — CBC
HCT: 37.3 % — ABNORMAL LOW (ref 39.0–52.0)
Hemoglobin: 12 g/dL — ABNORMAL LOW (ref 13.0–17.0)
MCH: 35.3 pg — ABNORMAL HIGH (ref 26.0–34.0)
MCHC: 32.2 g/dL (ref 30.0–36.0)
MCV: 109.7 fL — ABNORMAL HIGH (ref 80.0–100.0)
Platelets: 140 10*3/uL — ABNORMAL LOW (ref 150–400)
RBC: 3.4 MIL/uL — ABNORMAL LOW (ref 4.22–5.81)
RDW: 16.6 % — ABNORMAL HIGH (ref 11.5–15.5)
WBC: 7.9 10*3/uL (ref 4.0–10.5)
nRBC: 0.3 % — ABNORMAL HIGH (ref 0.0–0.2)

## 2023-09-11 LAB — CULTURE, RESPIRATORY W GRAM STAIN

## 2023-09-11 MED ORDER — IPRATROPIUM-ALBUTEROL 0.5-2.5 (3) MG/3ML IN SOLN: 3.0000 mL | Freq: Two times a day (BID) | RESPIRATORY_TRACT | Status: DC

## 2023-09-11 MED ORDER — OSELTAMIVIR PHOSPHATE 30 MG PO CAPS
30.0000 mg | ORAL_CAPSULE | Freq: Two times a day (BID) | ORAL | 0 refills | Status: AC
  Filled 2023-09-11: qty 2, 1d supply, fill #0

## 2023-09-11 MED ORDER — CARVEDILOL 3.125 MG PO TABS
3.1250 mg | ORAL_TABLET | Freq: Two times a day (BID) | ORAL | 0 refills | Status: DC
  Filled 2023-09-11: qty 60, 30d supply, fill #0

## 2023-09-11 MED ORDER — PREDNISONE 10 MG PO TABS
ORAL_TABLET | ORAL | 0 refills | Status: DC
  Filled 2023-09-11: qty 20, 8d supply, fill #0

## 2023-09-11 MED ORDER — AMOXICILLIN-POT CLAVULANATE 875-125 MG PO TABS
1.0000 | ORAL_TABLET | Freq: Two times a day (BID) | ORAL | 0 refills | Status: AC
  Filled 2023-09-11: qty 10, 5d supply, fill #0

## 2023-09-11 NOTE — Plan of Care (Signed)
   Problem: Education: Goal: Knowledge of General Education information will improve Description: Including pain rating scale, medication(s)/side effects and non-pharmacologic comfort measures Outcome: Progressing   Problem: Clinical Measurements: Goal: Ability to maintain clinical measurements within normal limits will improve Outcome: Progressing Goal: Respiratory complications will improve Outcome: Progressing   Problem: Nutrition: Goal: Adequate nutrition will be maintained Outcome: Progressing   Problem: Pain Managment: Goal: General experience of comfort will improve and/or be controlled Outcome: Progressing   Problem: Safety: Goal: Ability to remain free from injury will improve Outcome: Progressing

## 2023-09-11 NOTE — Discharge Summary (Signed)
 Physician Discharge Summary  Jeffrey Stewart UEA:[id number] DOB: [birth year] DOA: 09/07/2023  PCP: Fredrich Romans, FNP  Admit date: 09/07/2023 Discharge date: 09/11/2023 Recommendations for Outpatient Follow-up:  Follow up with PCP in 1 weeks-call for appointment Please obtain BMP/CBC in one week  Discharge Dispo: home Discharge Condition: Stable Code Status:   Code Status: Full Code Diet recommendation:  Diet Order             Diet 2 gram sodium Room service appropriate? Yes; Fluid consistency: Thin  Diet effective now                    Brief/Interim Summary: 89 year old male with history of interstitial lung disease/abnormal CT scan, COVID-19 in 2022, polymyalgia rheumatica/seronegative rheumatoid arthritis on chronic prednisone, chronic CHF, CAD, HTN presented with worsening cough with yellow sputum and worsening shortness of breath from baseline without chest pain palpitation. Patient was seen by primary care office the day of admission for above symptoms was hypoxic 84% on room air and transferred to ED.  Workup with influenza positive. In the ED: Vitals showed hypoxia needing 4 L nasal cannula tachypneic around 30/min, VBG with pH 7.4 pCO2 42 pO2 less than 31, CMP showed total bili 1.4 creatinine 1.5 CBC with leukocytosis Lactic acid 2.6 on recheck 1.9. Chest x-ray>> minimal bibasilar subsegmental atelectasis or possibly infiltrates. Patient was managed with IV antibiotics steroid and IV Lasix overall he is clinically improved.  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  Also immunosuppressed. Seen by [facility] with IV steroid few days of Lasix Tamiflu antibiotics and clinically improved.  No longer needing oxygen and doing well on ambulation. Will discharge him on oral steroid taper continue Tamiflu and antibiotics and continue home torsemide  AKI: Baseline creatinine 0.25 June 2023 on admission 1.5-1.6, improved to 1.0 Cont to avoid nephrotoxic medication encourage oral hydration  Polymyalgia rheumatica/seronegative rheumatoid arthritis on chronic prednisone; On steroid per #1  HLD HTN CAD: BP soft. Continue home aspirin and statin.  Coreg dose has been decreased from 6.25 to 3.125 mg twice daily  Chronic diastolic CHF X-ray with bibasilar atelectasis and small effusion: Patient IV Lasix 2/22 will continue on home torsemide Coreg as BP allows  Macrocytic anemia Mild thrombocytopenia: Hemoglobin around 10,  pmt >100k, B12 and folate level adequate.  Monitor labs  Consults: pccm Subjective: Alert and oriented resting comfortably, doing well with walking  Discharge Exam: Vitals:   09/11/23 0938 09/11/23 1253  BP:  (!) 105/54  Pulse:  64  Resp:  15  Temp:  98.1 F (36.7 C)  SpO2: 96% 97%   General: Pt is alert, awake, not in acute distress Cardiovascular: RRR, S1/S2 +, no rubs, no gallops Respiratory: CTA bilaterally, no wheezing, no rhonchi Abdominal: Soft, NT, ND, bowel sounds + Extremities:  no edema, no cyanosis  Discharge Instructions  Discharge Instructions     Discharge instructions   Complete by: As directed    Please call call MD or return to ER for similar or worsening recurring problem that brought you  to hospital or if any fever,nausea/vomiting,abdominal pain, uncontrolled pain, chest pain,  shortness of breath or any other alarming symptoms.  Please follow-up your doctor as instructed in a week time and call the office for appointment.  Please avoid alcohol, smoking, or any other illicit substance and maintain healthy habits including taking your regular medications as prescribed.  You were cared for by a hospitalist during your hospital stay. If you have any questions about your discharge medications or the care you received while you were in the hospital after you are discharged, you can call the unit and ask to speak with the hospitalist on call if the hospitalist that took care of you is not available.  Once you are discharged, your primary care physician will handle any further medical issues. Please note that NO REFILLS for any discharge medications will be authorized once you are discharged, as it is imperative that you return to your primary care physician (or establish a relationship with a primary care physician if you do not have one) for your aftercare needs so that they can reassess your need for medications and monitor your lab values   Increase activity slowly   Complete by: As directed       Allergies as of 09/11/2023       Reactions   Niaspan [niacin] Other (See Comments)   Intolerance  Flush feeling WHEN ON LARGE DOSE - IS ABLE TO TAKE SMALLER DOSAGE WITHOUT PROBLEM   Meloxicam    Disorientation         Medication List     PAUSE taking these medications    predniSONE 5 MG tablet Wait to take this until: September 18, 2023 Commonly known as: DELTASONE Take 5 mg by mouth daily with breakfast. You also have another medication with the same name that you may need to continue taking.   predniSONE 1 MG tablet Wait to take this until: September 18, 2023 Commonly known as: DELTASONE Take 1 mg by mouth at bedtime. You also have another medication with the same name that you  may need to continue taking.       TAKE these medications    acetaminophen 500 MG tablet Commonly known as: TYLENOL Take 1,000 mg by mouth every 6 (six) hours as needed for moderate pain.   albuterol 108 (90 Base) MCG/ACT inhaler Commonly known as: VENTOLIN HFA Inhale 2 puffs into the lungs every 4 (four) hours as needed for wheezing or shortness of breath.   amoxicillin-clavulanate 875-125 MG tablet Commonly known as: AUGMENTIN Take 1 tablet by mouth 2 (two) times daily for 5 days.   aspirin EC 81 MG tablet Take 81 mg by mouth every evening.   atorvastatin 40 MG tablet Commonly known as: LIPITOR TAKE 1 TABLET AT BEDTIME   brimonidine 0.2 % ophthalmic solution Commonly known as: ALPHAGAN Place 1 drop into both eyes 2 (two) times daily.   carvedilol 3.125 MG tablet Commonly known as: COREG Take 1 tablet (3.125 mg total) by mouth 2 (two) times daily. What changed:  medication strength how much to take   cetirizine 10 MG tablet Commonly known as: ZYRTEC Take 10 mg by mouth at bedtime.   fish oil-omega-3 fatty acids 1000 MG capsule Take 1 g by  mouth 2 (two) times daily.   fluticasone 50 MCG/ACT nasal spray Commonly known as: FLONASE Place 1-2 sprays into both nostrils daily.   Fluticasone-Salmeterol 250-50 MCG/DOSE Aepb Commonly known as: ADVAIR Inhale 1 puff into the lungs 2 (two) times daily.   glucosamine-chondroitin 500-400 MG tablet Take 1 tablet by mouth at bedtime.   ipratropium-albuterol 0.5-2.5 (3) MG/3ML Soln Commonly known as: DUONEB Take 3 mLs by nebulization every 6 (six) hours as needed.   latanoprost 0.005 % ophthalmic solution Commonly known as: XALATAN Place 1 drop into both eyes at bedtime.   metroNIDAZOLE 0.75 % cream Commonly known as: METROCREAM Apply 1 application  topically 2 (two) times daily as needed (for rosacea).   minocycline 50 MG tablet Commonly known as: DYNACIN Take 50 mg by mouth 2 (two) times daily.   mycophenolate  500 MG tablet Commonly known as: CELLCEPT Take 2 tablets (1,000 mg total) by mouth 2 (two) times daily.   NEOMYCIN-POLYMYXIN-HYDROCORTISONE 1 % Soln OTIC solution Commonly known as: CORTISPORIN Place 1 drop into both ears as needed (when ears are "stopped up").   nitroGLYCERIN 0.4 MG SL tablet Commonly known as: NITROSTAT Place 1 tablet (0.4 mg total) under the tongue every 5 (five) minutes as needed for chest pain.   oseltamivir 30 MG capsule Commonly known as: TAMIFLU Take 1 capsule (30 mg total) by mouth 2 (two) times daily for 1 day.   Polyethyl Glycol-Propyl Glycol 0.4-0.3 % Soln Place 1 drop into both eyes in the morning and at bedtime.   potassium chloride SA 20 MEQ tablet Commonly known as: KLOR-CON M TAKE 1 TABLET EVERY DAY   predniSONE 10 MG tablet Commonly known as: DELTASONE Take PO 4 tabs daily x 2 days,3 tabs daily x 2 days,2 tabs daily x 2 days,1 tab daily x 2 days then stop.  Hold home prednisone 6mg  while on steroid taper What changed: Another medication with the same name was paused. Ask your nurse or doctor if you should take this medication.   sodium chloride 0.65 % nasal spray Commonly known as: OCEAN Place 1 spray into the nose as needed for congestion.   torsemide 20 MG tablet Commonly known as: DEMADEX TAKE 1 TABLET BY MOUTH EVERY DAY   Vitamin D3 1000 units Caps Take 1,000 Units by mouth daily.        Follow-up Information     Fredrich Romans, FNP Follow up in 1 week(s).   Specialty: Nurse Practitioner Contact information: [address] [place] [address] [phone]                Allergies  Allergen Reactions   Niaspan [Niacin] Other (See Comments)    Intolerance  Flush feeling WHEN ON LARGE DOSE - IS ABLE TO TAKE SMALLER DOSAGE WITHOUT PROBLEM   Meloxicam     Disorientation     The results of significant diagnostics from this hospitalization (including imaging, microbiology, ancillary and laboratory) are listed  below for reference.    Microbiology: Recent Results (from the past 240 hours)  Expectorated Sputum Assessment w Gram Stain, Rflx to Resp Cult     Status: None   Collection Time: 09/07/23  6:15 AM   Specimen: Expectorated Sputum  Result Value Ref Range Status   Specimen Description EXPECTORATED SPUTUM  Final   Special Requests NONE  Final   Sputum evaluation   Final    THIS SPECIMEN IS ACCEPTABLE FOR SPUTUM CULTURE Performed at [facility], [address] [place], [place] [address]    Report Status 09/08/2023 FINAL  Final  Culture, Respiratory w Gram Stain     Status: None   Collection Time: 09/07/23  6:15 AM  Result Value Ref Range Status   Specimen Description   Final    EXPECTORATED SPUTUM Performed at [facility], [address], [place], [place] [address]    Special Requests   Final    NONE Reflexed from [id number] Performed at [facility], [address] [place], [place] [address]    Gram Stain   Final    ABUNDANT WBC PRESENT, PREDOMINANTLY PMN FEW SQUAMOUS EPITHELIAL CELLS PRESENT ABUNDANT GRAM POSITIVE COCCI RARE GRAM NEGATIVE RODS Performed at [facility], [address] [place], [place] [address]    Culture ABUNDANT STREPTOCOCCUS PNEUMONIAE  Final   Report Status 09/11/2023 FINAL  Final   Organism ID, Bacteria STREPTOCOCCUS PNEUMONIAE  Final      Susceptibility   Streptococcus pneumoniae - MIC*    ERYTHROMYCIN <=0.12 SENSITIVE Sensitive     LEVOFLOXACIN 2 SENSITIVE Sensitive     VANCOMYCIN 0.5 SENSITIVE Sensitive     PENICILLIN (meningitis) <=0.06 SENSITIVE Sensitive     PENO - penicillin <=0.06      PENICILLIN (non-meningitis) <=0.06 SENSITIVE Sensitive     PENICILLIN (oral) <=0.06 SENSITIVE Sensitive     CEFTRIAXONE (non-meningitis) <=0.12 SENSITIVE Sensitive     CEFTRIAXONE (meningitis) <=0.12 SENSITIVE Sensitive     * ABUNDANT STREPTOCOCCUS PNEUMONIAE  Blood Culture (routine x 2)      Status: None (Preliminary result)   Collection Time: 09/07/23 12:51 PM   Specimen: BLOOD  Result Value Ref Range Status   Specimen Description   Final    BLOOD RIGHT ANTECUBITAL Performed at [facility], [address] [place], [place] [address]    Special Requests   Final    BOTTLES DRAWN AEROBIC AND ANAEROBIC Blood Culture adequate volume Performed at [facility], [address], [place], [place] [address]    Culture   Final    NO GROWTH 4 DAYS Performed at [facility], [address], [place], [place] [address]    Report Status PENDING  Incomplete  Blood Culture (routine x 2)     Status: Abnormal   Collection Time: 09/07/23  1:04 PM   Specimen: BLOOD  Result Value Ref Range Status   Specimen Description   Final    BLOOD Performed at [facility], [address], [place], [place] [address]    Special Requests   Final    BOTTLES DRAWN AEROBIC AND ANAEROBIC Blood Culture results may not be optimal due to an inadequate volume of blood received in culture bottles Performed at [facility], [address] [place], [place] [address]    Culture  Setup Time   Final    GRAM POSITIVE COCCI AEROBIC BOTTLE ONLY CRITICAL RESULT CALLED TO, READ BACK BY AND VERIFIED WITH: PHARMD MORGAN GRAVLEY [id number] 1806 BY J RAZZAK, MT    Culture (A)  Final    STAPHYLOCOCCUS EPIDERMIDIS THE SIGNIFICANCE OF ISOLATING THIS ORGANISM FROM A SINGLE SET OF BLOOD CULTURES WHEN MULTIPLE SETS ARE DRAWN IS UNCERTAIN. PLEASE NOTIFY THE MICROBIOLOGY DEPARTMENT WITHIN ONE WEEK IF SPECIATION AND SENSITIVITIES ARE REQUIRED. Performed at [facility], [address] [place], [place] [address]    Report Status 09/09/2023 FINAL  Final  Blood Culture ID Panel (Reflexed)  Status: Abnormal   Collection Time: 09/07/23  1:04 PM  Result Value Ref Range Status   Enterococcus faecalis NOT DETECTED NOT DETECTED Final    Enterococcus Faecium NOT DETECTED NOT DETECTED Final   Listeria monocytogenes NOT DETECTED NOT DETECTED Final   Staphylococcus species DETECTED (A) NOT DETECTED Final    Comment: CRITICAL RESULT CALLED TO, READ BACK BY AND VERIFIED WITH: PHARMD MORGAN GRAVLEY [id number] 1806 BY J RAZZAK, MT    Staphylococcus aureus (BCID) NOT DETECTED NOT DETECTED Final   Staphylococcus epidermidis DETECTED (A) NOT DETECTED Final    Comment: CRITICAL RESULT CALLED TO, READ BACK BY AND VERIFIED WITH: PHARMD MORGAN GRAVLEY [phone] 1806 BY J RAZZAK, MT    Staphylococcus lugdunensis NOT DETECTED NOT DETECTED Final   Streptococcus species NOT DETECTED NOT DETECTED Final   Streptococcus agalactiae NOT DETECTED NOT DETECTED Final   Streptococcus pneumoniae NOT DETECTED NOT DETECTED Final   Streptococcus pyogenes NOT DETECTED NOT DETECTED Final   A.calcoaceticus-baumannii NOT DETECTED NOT DETECTED Final   Bacteroides fragilis NOT DETECTED NOT DETECTED Final   Enterobacterales NOT DETECTED NOT DETECTED Final   Enterobacter cloacae complex NOT DETECTED NOT DETECTED Final   Escherichia coli NOT DETECTED NOT DETECTED Final   Klebsiella aerogenes NOT DETECTED NOT DETECTED Final   Klebsiella oxytoca NOT DETECTED NOT DETECTED Final   Klebsiella pneumoniae NOT DETECTED NOT DETECTED Final   Proteus species NOT DETECTED NOT DETECTED Final   Salmonella species NOT DETECTED NOT DETECTED Final   Serratia marcescens NOT DETECTED NOT DETECTED Final   Haemophilus influenzae NOT DETECTED NOT DETECTED Final   Neisseria meningitidis NOT DETECTED NOT DETECTED Final   Pseudomonas aeruginosa NOT DETECTED NOT DETECTED Final   Stenotrophomonas maltophilia NOT DETECTED NOT DETECTED Final   Candida albicans NOT DETECTED NOT DETECTED Final   Candida auris NOT DETECTED NOT DETECTED Final   Candida glabrata NOT DETECTED NOT DETECTED Final   Candida krusei NOT DETECTED NOT DETECTED Final   Candida parapsilosis NOT DETECTED NOT DETECTED  Final   Candida tropicalis NOT DETECTED NOT DETECTED Final   Cryptococcus neoformans/gattii NOT DETECTED NOT DETECTED Final   Methicillin resistance mecA/C NOT DETECTED NOT DETECTED Final    Comment: Performed at [facility], [address], [place], [place] [address]  SARS Coronavirus 2 by RT PCR (hospital order, performed in [facility] hospital lab) *cepheid single result test* Anterior Nasal Swab     Status: None   Collection Time: 09/07/23  5:03 PM   Specimen: Anterior Nasal Swab  Result Value Ref Range Status   SARS Coronavirus 2 by RT PCR NEGATIVE NEGATIVE Final    Comment: (NOTE) SARS-CoV-2 target nucleic acids are NOT DETECTED.  The SARS-CoV-2 RNA is generally detectable in upper and lower respiratory specimens during the acute phase of infection. The lowest concentration of SARS-CoV-2 viral copies this assay can detect is 250 copies / mL. A negative result does not preclude SARS-CoV-2 infection and should not be used as the sole basis for treatment or other patient management decisions.  A negative result may occur with improper specimen collection / handling, submission of specimen other than nasopharyngeal swab, presence of viral mutation(s) within the areas targeted by this assay, and inadequate number of viral copies (<250 copies / mL). A negative result must be combined with clinical observations, patient history, and epidemiological information.  Fact Sheet for Patients:   RoadLapTop.co.za  Fact Sheet for Healthcare Providers: http://kim-miller.com/  This test is not yet approved or  cleared by  the [employer] and has been authorized for detection and/or diagnosis of SARS-CoV-2 by FDA under an Emergency Use Authorization (EUA).  This EUA will remain in effect (meaning this test can be used) for the duration of the COVID-19 declaration under Section 564(b)(1) of the Act, 21 U.S.C. section 360bbb-3(b)(1),  unless the authorization is terminated or revoked sooner.  Performed at [facility], [address], [place], [place] [address]     Procedures/Studies: DG Chest Port 1 View Result Date: 09/08/2023 CLINICAL DATA:  Short of breath EXAM: PORTABLE CHEST 1 VIEW COMPARISON:  Radiograph 09/07/2023, CT 06/04/2023 FINDINGS: Mildly enlarged cardiac silhouette. Bilateral pleural effusions and mild basilar atelectasis. Scarring in the lateral aspect of the RIGHT upper lobe is again noted. Cannot exclude RIGHT basilar infiltrate. IMPRESSION: 1. Bibasilar atelectasis and small effusions. 2. RIGHT lower lobe atelectasis versus infiltrate. 3. Scarring in the mid RIGHT lateral lung. Electronically Signed   By: Genevive Bi M.D.   On: 09/08/2023 11:29   DG Chest Port 1 View Result Date: 09/07/2023 CLINICAL DATA:  Shortness of breath. EXAM: PORTABLE CHEST 1 VIEW COMPARISON:  June 19, 2022. FINDINGS: Stable cardiomediastinal silhouette. Minimal bibasilar subsegmental atelectasis or possibly infiltrates are noted. Bony thorax is unremarkable. IMPRESSION: Minimal bibasilar subsegmental atelectasis or possibly infiltrates. Electronically Signed   By: Lupita Raider M.D.   On: 09/07/2023 14:07    Labs: BNP (last 3 results) Recent Labs    09/08/23 0733  BNP 77.7   Basic Metabolic Panel: Recent Labs  Lab 09/07/23 1250 09/07/23 1947 09/08/23 0733 09/09/23 0601 09/10/23 0945 09/11/23 0517  NA 138  --  137 139 138 141  K 3.9  --  4.0 4.1 3.6 3.9  CL 99  --  100 101 100 101  CO2 25  --  28 30 26 28   GLUCOSE 171*  --  154* 172* 204* 181*  BUN 41*  --  49* 51* 52* 50*  CREATININE 1.56* 1.63* 1.29* 1.12 1.15 1.01  CALCIUM 8.5*  --  8.3* 8.4* 8.6* 8.7*   Liver Function Tests: Recent Labs  Lab 09/07/23 1250  AST 24  ALT 15  ALKPHOS 42  BILITOT 1.4*  PROT 6.5  ALBUMIN 3.4*   No results for input(s): "LIPASE", "AMYLASE" in the last 168 hours. No results for input(s):  "AMMONIA" in the last 168 hours. CBC: Recent Labs  Lab 09/07/23 1250 09/07/23 1947 09/08/23 0733 09/09/23 0601 09/10/23 0512 09/11/23 0517  WBC 14.4* 15.7* 13.5* 8.2 7.9 7.9  NEUTROABS 13.2*  --   --   --   --   --   HGB 13.1 12.0* 11.0* 10.6* 11.1* 12.0*  HCT 40.6 37.6* 34.1* 32.7* 34.6* 37.3*  MCV 109.1* 109.9* 110.4* 109.0* 110.9* 109.7*  PLT 149* 132* 125* 115* 135* 140*   Cardiac Enzymes: No results for input(s): "CKTOTAL", "CKMB", "CKMBINDEX", "TROPONINI" in the last 168 hours. BNP: Invalid input(s): "POCBNP" CBG: No results for input(s): "GLUCAP" in the last 168 hours. D-Dimer No results for input(s): "DDIMER" in the last 72 hours. Hgb A1c No results for input(s): "HGBA1C" in the last 72 hours. Lipid Profile No results for input(s): "CHOL", "HDL", "LDLCALC", "TRIG", "CHOLHDL", "LDLDIRECT" in the last 72 hours. Thyroid function studies No results for input(s): "TSH", "T4TOTAL", "T3FREE", "THYROIDAB" in the last 72 hours.  Invalid input(s): "FREET3" Anemia work up Recent Labs    09/09/23 1005  VITAMINB12 1,795*  FOLATE >40.0   Urinalysis    Component Value Date/Time  COLORURINE YELLOW 09/07/2023 0615   APPEARANCEUR HAZY (A) 09/07/2023 0615   LABSPEC 1.023 09/07/2023 0615   PHURINE 5.0 09/07/2023 0615   GLUCOSEU NEGATIVE 09/07/2023 0615   HGBUR NEGATIVE 09/07/2023 0615   BILIRUBINUR NEGATIVE 09/07/2023 0615   KETONESUR NEGATIVE 09/07/2023 0615   PROTEINUR NEGATIVE 09/07/2023 0615   UROBILINOGEN 1.0 09/24/2013 0905   NITRITE NEGATIVE 09/07/2023 0615   LEUKOCYTESUR NEGATIVE 09/07/2023 0615   Sepsis Labs Recent Labs  Lab 09/08/23 0733 09/09/23 0601 09/10/23 0512 09/11/23 0517  WBC 13.5* 8.2 7.9 7.9   Microbiology Recent Results (from the past 240 hours)  Expectorated Sputum Assessment w Gram Stain, Rflx to Resp Cult     Status: None   Collection Time: 09/07/23  6:15 AM   Specimen: Expectorated Sputum  Result Value Ref Range Status   Specimen  Description EXPECTORATED SPUTUM  Final   Special Requests NONE  Final   Sputum evaluation   Final    THIS SPECIMEN IS ACCEPTABLE FOR SPUTUM CULTURE Performed at [facility], [address], [place], [place] [address]    Report Status 09/08/2023 FINAL  Final  Culture, Respiratory w Gram Stain     Status: None   Collection Time: 09/07/23  6:15 AM  Result Value Ref Range Status   Specimen Description   Final    EXPECTORATED SPUTUM Performed at [facility], [address], [address] [place], [place] [address]    Special Requests   Final    NONE Reflexed from [id number] Performed at [facility], [address], [place], [place] [address]    Gram Stain   Final    ABUNDANT WBC PRESENT, PREDOMINANTLY PMN FEW SQUAMOUS EPITHELIAL CELLS PRESENT ABUNDANT GRAM POSITIVE COCCI RARE GRAM NEGATIVE RODS Performed at [facility], [address] [place], [place] [address]    Culture ABUNDANT STREPTOCOCCUS PNEUMONIAE  Final   Report Status 09/11/2023 FINAL  Final   Organism ID, Bacteria STREPTOCOCCUS PNEUMONIAE  Final      Susceptibility   Streptococcus pneumoniae - MIC*    ERYTHROMYCIN <=0.12 SENSITIVE Sensitive     LEVOFLOXACIN 2 SENSITIVE Sensitive     VANCOMYCIN 0.5 SENSITIVE Sensitive     PENICILLIN (meningitis) <=0.06 SENSITIVE Sensitive     PENO - penicillin <=0.06      PENICILLIN (non-meningitis) <=0.06 SENSITIVE Sensitive     PENICILLIN (oral) <=0.06 SENSITIVE Sensitive     CEFTRIAXONE (non-meningitis) <=0.12 SENSITIVE Sensitive     CEFTRIAXONE (meningitis) <=0.12 SENSITIVE Sensitive     * ABUNDANT STREPTOCOCCUS PNEUMONIAE  Blood Culture (routine x 2)     Status: None (Preliminary result)   Collection Time: 09/07/23 12:51 PM   Specimen: BLOOD  Result Value Ref Range Status   Specimen Description   Final    BLOOD RIGHT ANTECUBITAL Performed at [facility], [address], [place], [place] [address]    Special  Requests   Final    BOTTLES DRAWN AEROBIC AND ANAEROBIC Blood Culture adequate volume Performed at [facility], [address] [place], [place] [address]    Culture   Final    NO GROWTH 4 DAYS Performed at [facility], [address], [place], [place] [address]    Report Status PENDING  Incomplete  Blood Culture (routine x 2)     Status: Abnormal   Collection Time: 09/07/23  1:04 PM   Specimen: BLOOD  Result Value Ref Range Status   Specimen Description  Final    BLOOD Performed at [facility], [address] [place], [place] [address]    Special Requests   Final    BOTTLES DRAWN AEROBIC AND ANAEROBIC Blood Culture results may not be optimal due to an inadequate volume of blood received in culture bottles Performed at [facility] [facility], [address], [place], [place] [address]    Culture  Setup Time   Final    GRAM POSITIVE COCCI AEROBIC BOTTLE ONLY CRITICAL RESULT CALLED TO, READ BACK BY AND VERIFIED WITH: PHARMD MORGAN GRAVLEY [phone] 1806 BY J RAZZAK, MT    Culture (A)  Final    STAPHYLOCOCCUS EPIDERMIDIS THE SIGNIFICANCE OF ISOLATING THIS ORGANISM FROM A SINGLE SET OF BLOOD CULTURES WHEN MULTIPLE SETS ARE DRAWN IS UNCERTAIN. PLEASE NOTIFY THE MICROBIOLOGY DEPARTMENT WITHIN ONE WEEK IF SPECIATION AND SENSITIVITIES ARE REQUIRED. Performed at [facility], [address] [place], [place] [address]    Report Status 09/09/2023 FINAL  Final  Blood Culture ID Panel (Reflexed)     Status: Abnormal   Collection Time: 09/07/23  1:04 PM  Result Value Ref Range Status   Enterococcus faecalis NOT DETECTED NOT DETECTED Final   Enterococcus Faecium NOT DETECTED NOT DETECTED Final   Listeria monocytogenes NOT DETECTED NOT DETECTED Final   Staphylococcus species DETECTED (A) NOT DETECTED Final    Comment: CRITICAL RESULT CALLED TO, READ BACK BY AND VERIFIED WITH: PHARMD MORGAN GRAVLEY [phone] 1806 BY J RAZZAK, MT     Staphylococcus aureus (BCID) NOT DETECTED NOT DETECTED Final   Staphylococcus epidermidis DETECTED (A) NOT DETECTED Final    Comment: CRITICAL RESULT CALLED TO, READ BACK BY AND VERIFIED WITH: PHARMD MORGAN GRAVLEY [id number] 1806 BY J RAZZAK, MT    Staphylococcus lugdunensis NOT DETECTED NOT DETECTED Final   Streptococcus species NOT DETECTED NOT DETECTED Final   Streptococcus agalactiae NOT DETECTED NOT DETECTED Final   Streptococcus pneumoniae NOT DETECTED NOT DETECTED Final   Streptococcus pyogenes NOT DETECTED NOT DETECTED Final   A.calcoaceticus-baumannii NOT DETECTED NOT DETECTED Final   Bacteroides fragilis NOT DETECTED NOT DETECTED Final   Enterobacterales NOT DETECTED NOT DETECTED Final   Enterobacter cloacae complex NOT DETECTED NOT DETECTED Final   Escherichia coli NOT DETECTED NOT DETECTED Final   Klebsiella aerogenes NOT DETECTED NOT DETECTED Final   Klebsiella oxytoca NOT DETECTED NOT DETECTED Final   Klebsiella pneumoniae NOT DETECTED NOT DETECTED Final   Proteus species NOT DETECTED NOT DETECTED Final   Salmonella species NOT DETECTED NOT DETECTED Final   Serratia marcescens NOT DETECTED NOT DETECTED Final   Haemophilus influenzae NOT DETECTED NOT DETECTED Final   Neisseria meningitidis NOT DETECTED NOT DETECTED Final   Pseudomonas aeruginosa NOT DETECTED NOT DETECTED Final   Stenotrophomonas maltophilia NOT DETECTED NOT DETECTED Final   Candida albicans NOT DETECTED NOT DETECTED Final   Candida auris NOT DETECTED NOT DETECTED Final   Candida glabrata NOT DETECTED NOT DETECTED Final   Candida krusei NOT DETECTED NOT DETECTED Final   Candida parapsilosis NOT DETECTED NOT DETECTED Final   Candida tropicalis NOT DETECTED NOT DETECTED Final   Cryptococcus neoformans/gattii NOT DETECTED NOT DETECTED Final   Methicillin resistance mecA/C NOT DETECTED NOT DETECTED Final    Comment: Performed at [facility], [address], [place], [place] [address]  SARS  Coronavirus 2 by RT PCR (hospital order, performed in [facility] hospital lab) *cepheid single result test* Anterior Nasal Swab     Status: None  Collection Time: 09/07/23  5:03 PM   Specimen: Anterior Nasal Swab  Result Value Ref Range Status   SARS Coronavirus 2 by RT PCR NEGATIVE NEGATIVE Final    Comment: (NOTE) SARS-CoV-2 target nucleic acids are NOT DETECTED.  The SARS-CoV-2 RNA is generally detectable in upper and lower respiratory specimens during the acute phase of infection. The lowest concentration of SARS-CoV-2 viral copies this assay can detect is 250 copies / mL. A negative result does not preclude SARS-CoV-2 infection and should not be used as the sole basis for treatment or other patient management decisions.  A negative result may occur with improper specimen collection / handling, submission of specimen other than nasopharyngeal swab, presence of viral mutation(s) within the areas targeted by this assay, and inadequate number of viral copies (<250 copies / mL). A negative result must be combined with clinical observations, patient history, and epidemiological information.  Fact Sheet for Patients:   RoadLapTop.co.za  Fact Sheet for Healthcare Providers: http://kim-miller.com/  This test is not yet approved or  cleared by the [place] FDA and has been authorized for detection and/or diagnosis of SARS-CoV-2 by FDA under an Emergency Use Authorization (EUA).  This EUA will remain in effect (meaning this test can be used) for the duration of the COVID-19 declaration under Section 564(b)(1) of the Act, 21 U.S.C. section 360bbb-3(b)(1), unless the authorization is terminated or revoked sooner.  Performed at [facility], [address], [place], [place] [address]   Family time coordinating discharge: 35 minutes  SIGNED: Lanae Boast, MD  [facility] 09/11/2023, 3:21 PM  If 7PM-7AM, please  contact night-coverage [email or web address]

## 2023-09-11 NOTE — Plan of Care (Signed)
  Problem: Clinical Measurements: Goal: Ability to maintain clinical measurements within normal limits will improve Outcome: Progressing   Problem: Clinical Measurements: Goal: Will remain free from infection Outcome: Progressing   Problem: Clinical Measurements: Goal: Respiratory complications will improve Outcome: Progressing   Problem: Activity: Goal: Risk for activity intolerance will decrease Outcome: Progressing   

## 2023-09-11 NOTE — Progress Notes (Signed)
 Mobility Specialist - Progress Note   09/11/23 1400  Mobility  Activity Ambulated with assistance in hallway  Level of Assistance Standby assist, set-up cues, supervision of patient - no hands on  Assistive Device Cane  Distance Ambulated (ft) 160 ft  Range of Motion/Exercises Active  Activity Response Tolerated well  Mobility Referral Yes  Mobility visit 1 Mobility  Mobility Specialist Start Time (ACUTE ONLY) 1422  Mobility Specialist Stop Time (ACUTE ONLY) 1434  Mobility Specialist Time Calculation (min) (ACUTE ONLY) 12 min   Received in bed and agreed to mobility. On Room air pt ambulated down the hall. SpO2% lowest point was 91% throughout session. No issues and returned to bed with all needs met. RN notified.   Marilynne Halsted Mobility Specialist

## 2023-09-11 NOTE — Progress Notes (Signed)
 TOC meds in a secure bag delivered to pt by this RN. AVS reviewed w/ pt who verbalized an understanding. No other questions at this time. Pt dressed for d/c to home. PIV removed by primary nurse. Pt's daughter enroute to hospital to pick up pt.

## 2023-09-12 LAB — CULTURE, BLOOD (ROUTINE X 2)
Culture: NO GROWTH
Special Requests: ADEQUATE

## 2023-09-15 ENCOUNTER — Other Ambulatory Visit: Payer: Self-pay | Admitting: Pulmonary Disease

## 2023-10-23 ENCOUNTER — Other Ambulatory Visit: Payer: Self-pay | Admitting: Physician Assistant

## 2023-10-23 DIAGNOSIS — D7589 Other specified diseases of blood and blood-forming organs: Secondary | ICD-10-CM

## 2023-10-23 DIAGNOSIS — D696 Thrombocytopenia, unspecified: Secondary | ICD-10-CM

## 2023-10-24 ENCOUNTER — Inpatient Hospital Stay: Payer: Medicare HMO | Attending: Physician Assistant

## 2023-10-24 ENCOUNTER — Inpatient Hospital Stay (HOSPITAL_BASED_OUTPATIENT_CLINIC_OR_DEPARTMENT_OTHER): Payer: Medicare HMO | Admitting: Physician Assistant

## 2023-10-24 VITALS — BP 106/49 | HR 72 | Temp 97.9°F | Resp 17 | Ht 72.0 in | Wt 203.3 lb

## 2023-10-24 DIAGNOSIS — D696 Thrombocytopenia, unspecified: Secondary | ICD-10-CM

## 2023-10-24 DIAGNOSIS — D7589 Other specified diseases of blood and blood-forming organs: Secondary | ICD-10-CM | POA: Insufficient documentation

## 2023-10-24 DIAGNOSIS — D539 Nutritional anemia, unspecified: Secondary | ICD-10-CM | POA: Diagnosis present

## 2023-10-24 DIAGNOSIS — Z801 Family history of malignant neoplasm of trachea, bronchus and lung: Secondary | ICD-10-CM | POA: Insufficient documentation

## 2023-10-24 LAB — CMP (CANCER CENTER ONLY)
ALT: 14 U/L (ref 0–44)
AST: 16 U/L (ref 15–41)
Albumin: 3.6 g/dL (ref 3.5–5.0)
Alkaline Phosphatase: 60 U/L (ref 38–126)
Anion gap: 4 — ABNORMAL LOW (ref 5–15)
BUN: 24 mg/dL — ABNORMAL HIGH (ref 8–23)
CO2: 32 mmol/L (ref 22–32)
Calcium: 8.9 mg/dL (ref 8.9–10.3)
Chloride: 102 mmol/L (ref 98–111)
Creatinine: 0.97 mg/dL (ref 0.61–1.24)
GFR, Estimated: >60 mL/min (ref >60)
Glucose, Bld: 106 mg/dL — ABNORMAL HIGH (ref 70–99)
Potassium: 4.1 mmol/L (ref 3.5–5.1)
Sodium: 138 mmol/L (ref 135–145)
Total Bilirubin: 0.7 mg/dL (ref 0.0–1.2)
Total Protein: 5.6 g/dL — ABNORMAL LOW (ref 6.5–8.1)

## 2023-10-24 LAB — CBC WITH DIFFERENTIAL (CANCER CENTER ONLY)
Abs Immature Granulocytes: 0.5 10*3/uL — ABNORMAL HIGH (ref 0.00–0.07)
Basophils Absolute: 0.1 10*3/uL (ref 0.0–0.1)
Basophils Relative: 1 %
Eosinophils Absolute: 0 10*3/uL (ref 0.0–0.5)
Eosinophils Relative: 0 %
HCT: 35.5 % — ABNORMAL LOW (ref 39.0–52.0)
Hemoglobin: 11.9 g/dL — ABNORMAL LOW (ref 13.0–17.0)
Immature Granulocytes: 5 %
Lymphocytes Relative: 20 %
Lymphs Abs: 2 10*3/uL (ref 0.7–4.0)
MCH: 35.8 pg — ABNORMAL HIGH (ref 26.0–34.0)
MCHC: 33.5 g/dL (ref 30.0–36.0)
MCV: 106.9 fL — ABNORMAL HIGH (ref 80.0–100.0)
Monocytes Absolute: 0.7 10*3/uL (ref 0.1–1.0)
Monocytes Relative: 7 %
Neutro Abs: 7 10*3/uL (ref 1.7–7.7)
Neutrophils Relative %: 67 %
Platelet Count: 164 10*3/uL (ref 150–400)
RBC: 3.32 MIL/uL — ABNORMAL LOW (ref 4.22–5.81)
RDW: 18.5 % — ABNORMAL HIGH (ref 11.5–15.5)
WBC Count: 10.3 10*3/uL (ref 4.0–10.5)
nRBC: 0.2 % (ref 0.0–0.2)

## 2023-10-24 NOTE — Progress Notes (Signed)
 [facility] [phone]   [phone]  PROGRESS NOTE  Patient Care Team: Fredrich Romans, FNP as PCP - General (Nurse Practitioner) Nahser, Deloris Ping, MD as PCP - Cardiology (Cardiology) Claud Kelp, MD as Consulting Physician (General Surgery)  Hematological/Oncological History # Elevated metamyelocytes # Thrombocytopenia # Macrocytosis 1) Labs from PCP, Jeffrey Stewart: -12/29/2020: WBC 9.8, Hgb 14.4, MCV 103.7, Plt 168,  Band Abs 0.8 (H), Myelo Abs 0.4 (H) -01/06/2021: WBC 9.8, Hgb 14.2, MCV 103.4, Plt 207, Mono Manuel Ab 1.1 (H), Meta Absolute 0.2 (H)  2) 01/21/2021: Establish care with Jeffrey Kaufmann PA-C  HISTORY OF PRESENTING ILLNESS:  Jeffrey Stewart 89 y.o. male returns for a follow up. Patient is accompanied by his wife for this visit. He was last seen in clinic on 04/25/2023. In the interim, he was recently hospitalized from 09/07/2023-09/11/2023 for acute hypoxic respiratory failure secondary influenza with possible bacterial pneumonia and underlying fibrotic ILD.   Today, Jeffrey Stewart reports his energy levels are slowly recovering after recent hospitalization. He reports his appetite is good. He denies nausea, vomiting or abdominal pain. He has occasional episodes of diarrhea that is chronic in nature. He does bruise easily that is more noticeable on his arms. He denies any overt signs of bleeding such as hematochezia or melena. He denies any fevers, chills, night sweats, chest pain or cough. He has no other complaints. Rest of the 10 point ROS is below.   MEDICAL HISTORY:  Past Medical History:  Diagnosis Date   Allergy    Arthritis    MINOR ARTHRITIS FEET AND TOES   Asthma    Cataract    CHF (congestive heart failure) (HCC) 11/2009   EF45-50%   Coronary artery disease    Hyperlipidemia    Myocardial infarction (HCC)    Pneumonia    HX OF PNEUMONIA 4 OR 5 YRS AGO   Rosacea    OF FACE   Shortness of breath    SOMETIMES SOB OR WHEEZING  WITH EXERTION    SURGICAL HISTORY: Past Surgical History:  Procedure Laterality Date   APPENDECTOMY     AGE 69   CARDIAC CATHETERIZATION  11/2009   mild / mod cad,MODERATE TIGHT STENOSIS IN THE DISTAL LEFT CIRCUMFLEX ARTERY   ELECTROCARDIOGRAM  11/2009   RBBB   EYE SURGERY     BILATERAL CATARACT EXTRACT EXTRACTION WITH IMPLANTS   INGUINAL HERNIA REPAIR Bilateral 09/29/2013   Procedure: LAPAROSCOPIC BILATERAL  INGUINAL HERNIA;  Surgeon: Ernestene Mention, MD;  Location: [facility];  Service: General;  Laterality: Bilateral;   INSERTION OF MESH Bilateral 09/29/2013   Procedure: INSERTION OF MESH;  Surgeon: Ernestene Mention, MD;  Location: [facility];  Service: General;  Laterality: Bilateral;   last echo 02/2009     last nuc 2009  12/16/2007   EF 33%. ABNORMAL. HE HAS APICAL DEFECT CONSISTENT WITH A SCAR.LV FUNCTION MODERATELY  DEPRESSED   US ECHOCARDIOGRAPHY  02/23/2009   EF 45-50%   US ECHOCARDIOGRAPHY  12/27/2007   EF 45-50%    SOCIAL HISTORY: Social History   Socioeconomic History   Marital status: Married    Spouse name: Not on file   Number of children: Not on file   Years of education: Not on file   Highest education level: Not on file  Occupational History   Not on file  Tobacco Use   Smoking status: Never   Smokeless tobacco: Never  Vaping Use   Vaping status: Never Used  Substance and Sexual Activity   Alcohol use: Not Currently    Comment: 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   Drug use: No   Sexual activity: Not on file  Other Topics Concern   Not on file  Social History Narrative   Not on file   Social Drivers of Health   Financial Resource Strain: Not on file  Food Insecurity: Low Risk  (09/19/2023)   Received from [facility]   Hunger Vital Sign    Worried About Running Out of Food in the Last Year: Never true    Ran Out of Food in the Last Year: Never true  Transportation Needs: No Transportation Needs (09/19/2023)   Received from [facility]    In  the past 12 months, has lack of reliable transportation kept you from medical appointments, meetings, work or from getting things needed for daily living? : No  Physical Activity: Not on file  Stress: Not on file  Social Connections: Moderately Integrated (09/07/2023)   Social Connection and Isolation Panel [NHANES]    Frequency of Communication with Friends and Family: More than three times a week    Frequency of Social Gatherings with Friends and Family: More than three times a week    Attends Religious Services: 1 to 4 times per year    Active Member of [employer] or Organizations: No    Attends Banker Meetings: Never    Marital Status: Married  Catering manager Violence: Not At Risk (09/07/2023)   Humiliation, Afraid, Rape, and Kick questionnaire    Fear of Current or Ex-Partner: No    Emotionally Abused: No    Physically Abused: No    Sexually Abused: No    FAMILY HISTORY: Family History  Problem Relation Age of Onset   Diabetes Sister    Lung cancer Sister        smoker    ALLERGIES:  is allergic to niaspan [niacin] and meloxicam.  MEDICATIONS:  Current Outpatient Medications  Medication Sig Dispense Refill   acetaminophen (TYLENOL) 500 MG tablet Take 1,000 mg by mouth every 6 (six) hours as needed for moderate pain.     albuterol (VENTOLIN HFA) 108 (90 Base) MCG/ACT inhaler Inhale 2 puffs into the lungs every 4 (four) hours as needed for wheezing or shortness of breath. 1 each 1   aspirin EC 81 MG tablet Take 81 mg by mouth every evening.     atorvastatin (LIPITOR) 40 MG tablet TAKE 1 TABLET AT BEDTIME 90 tablet 3   brimonidine (ALPHAGAN) 0.2 % ophthalmic solution Place 1 drop into both eyes 2 (two) times daily.     cetirizine (ZYRTEC) 10 MG tablet Take 10 mg by mouth at bedtime.     Cholecalciferol (VITAMIN D3) 1000 units CAPS Take 1,000 Units by mouth daily.     fish oil-omega-3 fatty acids 1000 MG capsule Take 1 g by mouth 2 (two) times daily.     fluticasone  (FLONASE) 50 MCG/ACT nasal spray Place 1-2 sprays into both nostrils daily.     Fluticasone-Salmeterol (ADVAIR) 250-50 MCG/DOSE AEPB Inhale 1 puff into the lungs 2 (two) times daily.     glucosamine-chondroitin 500-400 MG tablet Take 1 tablet by mouth at bedtime.     ipratropium-albuterol (DUONEB) 0.5-2.5 (3) MG/3ML SOLN Take 3 mLs by nebulization every 6 (six) hours as needed.     latanoprost (XALATAN) 0.005 % ophthalmic solution Place 1 drop into both eyes at bedtime.     metroNIDAZOLE (  METROCREAM) 0.75 % cream Apply 1 application  topically 2 (two) times daily as needed (for rosacea).     minocycline (DYNACIN) 50 MG tablet Take 50 mg by mouth 2 (two) times daily.     mycophenolate (CELLCEPT) 500 MG tablet TAKE 1 TABLET BY MOUTH TWICE A DAY 180 tablet 1   NEOMYCIN-POLYMYXIN-HYDROCORTISONE (CORTISPORIN) 1 % SOLN OTIC solution Place 1 drop into both ears as needed (when ears are "stopped up").     nitroGLYCERIN (NITROSTAT) 0.4 MG SL tablet Place 1 tablet (0.4 mg total) under the tongue every 5 (five) minutes as needed for chest pain. 25 tablet 5   Polyethyl Glycol-Propyl Glycol 0.4-0.3 % SOLN Place 1 drop into both eyes in the morning and at bedtime.     potassium chloride SA (KLOR-CON M) 20 MEQ tablet TAKE 1 TABLET EVERY DAY 90 tablet 3   predniSONE (DELTASONE) 1 MG tablet Take 1 mg by mouth at bedtime.     predniSONE (DELTASONE) 10 MG tablet Take 4 tabs daily x 2 days, 3 tabs daily x 2 days, 2 tabs daily x 2 days, 1 tab daily x 2 days then stop.  Hold home prednisone 6mg  while on steroid taper 20 tablet 0   predniSONE (DELTASONE) 5 MG tablet Take 5 mg by mouth daily with breakfast.     sodium chloride (OCEAN) 0.65 % nasal spray Place 1 spray into the nose as needed for congestion.     torsemide (DEMADEX) 20 MG tablet TAKE 1 TABLET BY MOUTH EVERY DAY 90 tablet 2   carvedilol (COREG) 3.125 MG tablet Take 1 tablet (3.125 mg total) by mouth 2 (two) times daily. 60 tablet 0   No current  facility-administered medications for this visit.    REVIEW OF SYSTEMS:   Constitutional: ( - ) fevers, ( - )  chills , ( - ) night sweats Eyes: ( - ) blurriness of vision, ( - ) double vision, ( - ) watery eyes Ears, nose, mouth, throat, and face: ( - ) mucositis, ( - ) sore throat Respiratory: ( -) cough, ( - ) dyspnea, ( -) wheezes  Cardiovascular: ( - ) palpitation, ( - ) chest discomfort, ( + ) lower extremity swelling Gastrointestinal:  ( - ) nausea, ( - ) heartburn, ( - ) change in bowel habits Skin: ( - ) abnormal skin rashes Lymphatics: ( - ) new lymphadenopathy, ( - ) easy bruising Neurological: ( - ) numbness, ( - ) tingling, ( - ) new weaknesses Behavioral/Psych: ( - ) mood change, ( - ) new changes  All other systems were reviewed with the patient and are negative.  PHYSICAL EXAMINATION: ECOG PERFORMANCE STATUS: 1 - Symptomatic but completely ambulatory  Vitals:   10/24/23 1325  BP: (!) 106/49  Pulse: 72  Resp: 17  Temp: 97.9 F (36.6 C)  SpO2: 100%   Filed Weights   10/24/23 1325  Weight: 203 lb 4.8 oz (92.2 kg)    GENERAL: well appearing male in NAD  SKIN: skin color, texture, turgor are normal, no rashes or significant lesions. Bruising over both arms.  EYES: conjunctiva are pink and non-injected, sclera clear LUNGS: clear to auscultation and percussion with normal breathing effort HEART: regular rate & rhythm and no murmurs. Edema in ankles and distal lower leg bilaterally.  Musculoskeletal: no cyanosis of digits and no clubbing  PSYCH: alert & oriented x 3, fluent speech NEURO: no focal motor/sensory deficits  LABORATORY DATA:  I have reviewed the data as  listed    Latest Ref Rng & Units 10/24/2023    1:02 PM 09/11/2023    5:17 AM 09/10/2023    5:12 AM  CBC  WBC 4.0 - 10.5 K/uL 10.3  7.9  7.9   Hemoglobin 13.0 - 17.0 g/dL 14.7  82.9  56.2   Hematocrit 39.0 - 52.0 % 35.5  37.3  34.6   Platelets 150 - 400 K/uL 164  140  135        Latest Ref Rng &  Units 10/24/2023    1:02 PM 09/11/2023    5:17 AM 09/10/2023    9:45 AM  CMP  Glucose 70 - 99 mg/dL 130  865  784   BUN 8 - 23 mg/dL 24  50  52   Creatinine 0.61 - 1.24 mg/dL 6.96  2.95  2.84   Sodium 135 - 145 mmol/L 138  141  138   Potassium 3.5 - 5.1 mmol/L 4.1  3.9  3.6   Chloride 98 - 111 mmol/L 102  101  100   CO2 22 - 32 mmol/L 32  28  26   Calcium 8.9 - 10.3 mg/dL 8.9  8.7  8.6   Total Protein 6.5 - 8.1 g/dL 5.6     Total Bilirubin 0.0 - 1.2 mg/dL 0.7     Alkaline Phos 38 - 126 U/L 60     AST 15 - 41 U/L 16     ALT 0 - 44 U/L 14       ASSESSMENT & PLAN Jeffrey Stewart is a 89 y.o. male that presents to the clinic for a follow up for the following:  #Macrocytic anemia: --Etiology unknown --Patient is not taking any medication that could cause macrocytosis --Patient has not consumed alcohol recently but reports prior heavy consumption --prior labs showed no evidence of vitamin B12 or folate deficiency. Most recently checked on 09/09/23.  --Today labs mild anemia with Hgb 11.9, MCV 106.9 --Monitor for now and consider bone marrow biopsy if anemia worsens (Hgb <10)   #Thrombocytopenia--resolved: -Mild and levels oscillate back and forth to normal. Today's Platelet count is 164K -Record review shows CT scan from 08/28/2013 that shows mild splenomegaly.  -Abdominal US confirms continued splenomegaly with no overt liver disease.    #Leukocytosis, neutrophil predominant--resolved --Labs today show normal WBC with neutrophilic predominance --He denies any signs of active infection at this time.  --Monitor for now.   Follow up:  -follow up visit in 6 months given mild anemia noted in labs today.    No orders of the defined types were placed in this encounter.  All questions were answered. The patient knows to call the clinic with any problems, questions or concerns.  I have spent a total of 30 minutes minutes of face-to-face and non-face-to-face time, preparing to see the  patient, performing a medically appropriate examination, counseling and educating the patient, ordering tests, documenting clinical information in the electronic health record,and care coordination.   Jeffrey Kaufmann PA-C Dept of Hematology and Oncology [facility] Phone: [phone]

## 2023-12-19 ENCOUNTER — Other Ambulatory Visit: Payer: Self-pay | Admitting: Cardiovascular Disease

## 2023-12-31 ENCOUNTER — Other Ambulatory Visit: Payer: Self-pay | Admitting: Nurse Practitioner

## 2024-01-02 MED ORDER — CARVEDILOL 3.125 MG PO TABS
3.1250 mg | ORAL_TABLET | Freq: Two times a day (BID) | ORAL | 1 refills | Status: DC
Start: 2024-01-02 — End: 2024-02-26

## 2024-01-31 ENCOUNTER — Other Ambulatory Visit: Payer: Self-pay | Admitting: Cardiovascular Disease

## 2024-02-08 ENCOUNTER — Ambulatory Visit: Admitting: Cardiology

## 2024-02-12 NOTE — Progress Notes (Deleted)
 Cardiology Office Note:    Date:  02/12/2024   ID:  Jeffrey Stewart, DOB 23-Mar-1935, MRN [id number]  PCP:  Anita Bernardino [name], FNP  Cardiologist:  Aleene Passe, MD (Inactive) { Click to update primary MD,subspecialty MD or APP then REFRESH:1}    Referring MD: Anita Bernardino [name], FNP   Chief Complaint: follow-up of CAD and CHF  History of Present Illness:    Jeffrey Stewart is a 89 y.o. male with a history of CAD with severe stenosis noted of PDA noted on cardiac catheterization in 2009 (medically treated), non-ischemic cardiomyopathy/ chronic HFreEF with EF as low as 35-40% in 2009 with subsequent normalization to 50% in 2023, RBBB, COPD, interstitial lung disease/ pulmonary fibrosis, hypertension complicated by orthostatic hypotension, hyperlipidemia, prediabetes, polymalgia rheumatica on chronic Prednisone , and macrocytic anemia who presents today for routine follow-up.   Patient has a history of CAD. Remote cardiac catheterization in 12/2007 during an admission for chest pain showed 40-50% stenosis of mid LAD, 50% stenosis of 1st Diag, and 80-90% stenosis of PDA. EF was 35-40% on LV gram and there was hypokinesis of the anterior wall, apex, and inferoapical wall. LVEF subsequently normalized. Last Echo in 08/2021 showed LVEF of 50% with global hypokinesis, severe LVH, and grade 1 diastolic dysfunction as well as moderately reduced RV function, mild to moderate AI, and mild dilatation of the ascending aorta measuring 40 mm. GDMT has been limited in recent years by hypotension.   He also has COPD and interstitial lung disease which is being treated with CellCept  and is managed by Pulmonology.   He was last seen by Dr. Passe in 06/2023 at which time he was stable form a cardiac standpoint. His BP remained low and he reported feeling very weak so Lisinopril  was discontinued.   Patient presents today for follow-up. ***  CAD Remote cath in 2009 showed severe stenosis of PDA and otherwise mild to  moderate disease. Medical therapy was recommended at that time. - No chest pain.  - Continue aspirin  and high-intensity statin.  Non-Ischemic Cardiomyopathy Chronic HFrEF with Recovered EF EF as low as 35-40% in 2009 but subsequently normalized. Las Echo in 08/2021 showed LVEF of 50% with global hypokinesis, severe LVH, and grade 1 diastolic dysfunction as well as moderately reduced RV function, mild to moderate AI, and mild dilatation of the ascending aorta measuring 40 mm. - Euvolemic on exam. *** - Continue Torsemide  20mg  daily.  - GDMT limited by hypotension.  - Continue Coreg  3.125mg  twice daily.  - Lisinopril  stopped at last visit due to persistent hypotension.   Hypotension History of Hypertension Patient has a history of hypertension but has struggled more with hypotension in recent years. - BP *** - Continue low dose Coreg  as above.  Hyperlipidemia Lipid panel in ***: - Continue Lipitor 40mg  daily.   EKGs/Labs/Other Studies Reviewed:    The following studies were reviewed:  Echocardiogram 08/25/2021: Impressions: 1. Left ventricular ejection fraction, by estimation, is 50%. The left  ventricle has low normal function. The left ventricle demonstrates global  hypokinesis. There is severe concentric left ventricular hypertrophy. Left  ventricular diastolic parameters  are consistent with Grade I diastolic dysfunction (impaired relaxation).   2. Right ventricular systolic function is moderately reduced. The right  ventricular size is normal.   3. The mitral valve is normal in structure. Trivial mitral valve  regurgitation. No evidence of mitral stenosis.   4. The aortic valve is normal in structure. Aortic valve regurgitation is  mild to  moderate. No aortic stenosis is present. Aortic regurgitation PHT  measures 478 msec.   5. Aortic dilatation noted. There is mild dilatation of the ascending  aorta, measuring 40 mm.    EKG:  EKG not ordered today.   Recent  Labs: 09/08/2023: B Natriuretic Peptide 77.7 10/24/2023: ALT 14; BUN 24; Creatinine 0.97; Hemoglobin 11.9; Platelet Count 164; Potassium 4.1; Sodium 138  Recent Lipid Panel    Component Value Date/Time   CHOL 109 02/20/2023 0944   TRIG 97 02/20/2023 0944   HDL 36 (L) 02/20/2023 0944   CHOLHDL 3.0 02/20/2023 0944   CHOLHDL 3.7 05/02/2016 0856   VLDL 15 05/02/2016 0856   LDLCALC 54 02/20/2023 0944    Physical Exam:    Vital Signs: There were no vitals taken for this visit.    Wt Readings from Last 3 Encounters:  10/24/23 203 lb 4.8 oz (92.2 kg)  07/05/23 209 lb 9.6 oz (95.1 kg)  07/05/23 206 lb 3.2 oz (93.5 kg)     General: 89 y.o. male in no acute distress. HEENT: Normocephalic and atraumatic. Sclera clear.  Neck: Supple. No carotid bruits. No JVD. Heart: *** RRR. Distinct S1 and S2. No murmurs, gallops, or rubs.  Lungs: No increased work of breathing. Clear to ausculation bilaterally. No wheezes, rhonchi, or rales.  Abdomen: Soft, non-distended, and non-tender to palpation.  Extremities: No lower extremity edema.  Radial and distal pedal pulses 2+ and equal bilaterally. Skin: Warm and dry. Neuro: No focal deficits. Psych: Normal affect. Responds appropriately.   Assessment:    No diagnosis found.  Plan:     Disposition: Follow up in ***   Signed, Aline FORBES Door, PA-C  02/12/2024 9:46 AM    [facility]

## 2024-02-25 NOTE — Progress Notes (Signed)
 Cardiology Office Note:    Date:  02/26/2024   ID:  Jeffrey Stewart, DOB Jan 26, 1935, MRN [id number]  PCP:  Anita Bernardino [name], FNP   [facility] Providers Cardiologist:  Oneil Parchment, MD     Referring MD: Anita Bernardino [name], FNP   Chief complaint: Follow-up  History of Present Illness:    Jeffrey Stewart is a 89 y.o. male with a hx of HFrEF, coronary artery disease, RBBB, hyperlipidemia, asthma, COPD, polymyalgia rheumatica and seronegative RA, interstitial lung disease, pulmonary fibrosis, PHTN with RV failure, macrocytic anemia, presents to the office today for his 23-month follow-up visit. Cardiac cath in 2009 showed mid LAD lesions 40-50%, D1 lesion at 50%, L PDA 80-90%, with recommendations to follow GDMT.  Left ventriculogram in 2009 revealed a moderately reduce LVSF, estimated EF at that time was 35-40%, with hypokinesis of the anterior wall, apex, and inferoapical wall. Echocardiogram on 08/25/2021 showed an LVEF at 50%, LV with low normal function and global hypokinesis, severe concentric left ventricular hypertrophy, G1 DD (impaired relaxation).  Normal RV structure, RV systolic function moderately reduced.  Mild to moderate aortic valve regurgitation, no aortic stenosis, normal valve structure.  EKG on 06/25/2023 showed NSR at 65 bpm, RBBB. Patient has chronic and worsening DOE likely secondary to his interstitial lung disease, for which he has recently seen [facility] for on 07/05/2023 and is currently on CellCept .  Patient also saw oncology on 10/24/2023 for macrocytic anemia, Hgb 11.9, MCV 106.9,  they decided to monitor for now and will consider bone marrow biopsy if anemia worsens with a Hgb <10 moving forward.  In the last cardiology visit patient's blood pressure has been running low, so they stopped his lisinopril .  Patient presents to the office alone today. Patient denies chest pain, change to his baseline dyspnea, new cough, palpitations, dizziness, syncope or near  syncope episodes. Reports that overall he does feel like he is improving.  States he was admitted to the hospital for pneumonia in 08/2023, then had bronchitis back in May, and states he has felt as though he has been weak since the start of these illnesses early in the year.  Reports his weakness is improving.  Patient lives at home with his wife and 34-year-old grandson, they are the primary care providers for the 32-year-old.  Patient states that over the last month his wife fell and broke her hip, and that he has had to be on his feet more providing care for her.  States that he has had less orthostatic hypotension and low blood pressure since discontinuing the lisinopril  back in December, and wishes to stay off that.  States that the doctor lessened his carvedilol  to 3.125 mg, but also says the pharmacy filled the prescription with 6.25 mg tablets, and states he has been taking the 6.25 mg tablets twice daily without issue.  States that since he has been on his feet more taking care of his wife, he has not had the opportunity to prop his legs up on pillows to help reduce the swelling as frequently as he used to.  States he does wear compression hose regularly.  States his appetite has improved, and that he tries to drink 2 protein shakes a day per the recommendations of another specialist.  Reports that the prednisone  he is taking for his arthritis that is managed by rheumatology has really helped to control the pain he feels in his joints, particularly his hands.  Discussed the risk of increased fluid  retention while on prednisone  with heart failure, patient states he has not noticed any significant fluctuations in his weight, but also states he has not weighing himself daily.  We discussed the importance of monitoring weight daily and when to contact the office regarding new weight gain. Weight today was 208 lb, down from 209 lb in December.  Patient reported to the CMA some confusion regarding which inhalers  she is on, but states she did use one prior to coming to his visit this morning that helped with his baseline wheezing. Patient does have +2 dependent pitting edema not extending past his ankles.    Past Medical History:  Diagnosis Date   Allergy    Arthritis    MINOR ARTHRITIS FEET AND TOES   Asthma    Cataract    CHF (congestive heart failure) (HCC) 11/2009   EF45-50%   Coronary artery disease    Hyperlipidemia    Myocardial infarction (HCC)    Pneumonia    HX OF PNEUMONIA 4 OR 5 YRS AGO   Rosacea    OF FACE   Shortness of breath    SOMETIMES SOB OR WHEEZING WITH EXERTION    Past Surgical History:  Procedure Laterality Date   APPENDECTOMY     AGE 37   CARDIAC CATHETERIZATION  11/2009   mild / mod cad,MODERATE TIGHT STENOSIS IN THE DISTAL LEFT CIRCUMFLEX ARTERY   ELECTROCARDIOGRAM  11/2009   RBBB   EYE SURGERY     BILATERAL CATARACT EXTRACT EXTRACTION WITH IMPLANTS   INGUINAL HERNIA REPAIR Bilateral 09/29/2013   Procedure: LAPAROSCOPIC BILATERAL  INGUINAL HERNIA;  Surgeon: Elon CHRISTELLA Pacini, MD;  Location: [facility];  Service: General;  Laterality: Bilateral;   INSERTION OF MESH Bilateral 09/29/2013   Procedure: INSERTION OF MESH;  Surgeon: Elon CHRISTELLA Pacini, MD;  Location: [facility];  Service: General;  Laterality: Bilateral;   last echo 02/2009     last nuc 2009  12/16/2007   EF 33%. ABNORMAL. HE HAS APICAL DEFECT CONSISTENT WITH A SCAR.LV FUNCTION MODERATELY  DEPRESSED   US  ECHOCARDIOGRAPHY  02/23/2009   EF 45-50%   US  ECHOCARDIOGRAPHY  12/27/2007   EF 45-50%    Current Medications: Current Meds  Medication Sig   acetaminophen  (TYLENOL ) 500 MG tablet Take 1,000 mg by mouth every 6 (six) hours as needed for moderate pain. (Patient taking differently: Take 1,000 mg by mouth as needed for moderate pain (pain score 4-6).)   albuterol  (VENTOLIN  HFA) 108 (90 Base) MCG/ACT inhaler Inhale 2 puffs into the lungs every 4 (four) hours as needed for wheezing or shortness of breath.    atorvastatin  (LIPITOR) 40 MG tablet TAKE 1 TABLET AT BEDTIME   brimonidine  (ALPHAGAN ) 0.2 % ophthalmic solution Place 1 drop into both eyes 2 (two) times daily.   carvedilol  (COREG ) 6.25 MG tablet Take 6.25 mg by mouth 2 (two) times daily with a meal.   cetirizine (ZYRTEC) 10 MG tablet Take 10 mg by mouth at bedtime.   Cholecalciferol  (VITAMIN D3) 1000 units CAPS Take 1,000 Units by mouth daily.   fish oil-omega-3 fatty acids  1000 MG capsule Take 1 g by mouth 2 (two) times daily.   fluticasone  (FLONASE ) 50 MCG/ACT nasal spray Place 1-2 sprays into both nostrils daily.   fluticasone -salmeterol (WIXELA INHUB) 250-50 MCG/ACT AEPB Inhale 1 puff into the lungs in the morning and at bedtime.   glucosamine-chondroitin 500-400 MG tablet Take 1 tablet by mouth at bedtime. (Patient taking differently: Take 1 tablet by mouth 2 (  two) times daily.)   ipratropium-albuterol  (DUONEB) 0.5-2.5 (3) MG/3ML SOLN Take 3 mLs by nebulization every 6 (six) hours as needed.   latanoprost  (XALATAN ) 0.005 % ophthalmic solution Place 1 drop into both eyes at bedtime.   metroNIDAZOLE  (METROCREAM ) 0.75 % cream Apply 1 application  topically 2 (two) times daily as needed (for rosacea).   minocycline  (DYNACIN ) 50 MG tablet Take 50 mg by mouth 2 (two) times daily. (Patient taking differently: Take 50 mg by mouth 2 (two) times daily. Takes as needed - mostly takes one tablet daily)   mycophenolate  (CELLCEPT ) 500 MG tablet TAKE 1 TABLET BY MOUTH TWICE A DAY (Patient taking differently: Take 1,000 mg by mouth 2 (two) times daily.)   NEOMYCIN -POLYMYXIN-HYDROCORTISONE  (CORTISPORIN) 1 % SOLN OTIC solution Place 1 drop into both ears as needed (when ears are stopped up).   nitroGLYCERIN  (NITROSTAT ) 0.4 MG SL tablet Place 1 tablet (0.4 mg total) under the tongue every 5 (five) minutes as needed for chest pain.   Polyethyl Glycol-Propyl Glycol 0.4-0.3 % SOLN Place 1 drop into both eyes in the morning and at bedtime.   potassium chloride  SA  (KLOR-CON  M) 20 MEQ tablet TAKE 1 TABLET EVERY DAY   predniSONE  (DELTASONE ) 1 MG tablet Take 1 mg by mouth at bedtime.   predniSONE  (DELTASONE ) 5 MG tablet Take 5 mg by mouth daily with breakfast.   sodium chloride  (OCEAN) 0.65 % nasal spray Place 1 spray into the nose as needed for congestion.   torsemide  (DEMADEX ) 20 MG tablet Take 1 tablet (20 mg total) by mouth daily.   [DISCONTINUED] carvedilol  (COREG ) 3.125 MG tablet Take 1 tablet (3.125 mg total) by mouth 2 (two) times daily.     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   Drug use: No   Sexual activity: Not on file  Other Topics Concern   Not on file  Social History Narrative   Not on file   Social Drivers of Health   Financial Resource Strain: Not on file  Food Insecurity: Low Risk  (11/30/2023)   Received from [facility]   Hunger Vital Sign    Within the past 12 months, you worried that your food would run out before you got money to buy more: Never true    Within the past 12 months, the food you bought just didn't last and you didn't have money to get more. : Never true  Transportation Needs: No Transportation Needs (11/30/2023)   Received from [facility]    In the past 12 months, has lack of reliable transportation kept you from medical appointments, meetings, work or from getting things needed for daily living? : No  Physical Activity: Not on file  Stress: Not on file  Social Connections: Moderately Integrated (09/07/2023)   Social Connection and  Isolation Panel    Frequency of Communication with Friends and Family: More than three times a week  Frequency of Social Gatherings with Friends and Family: More than three times a week    Attends Religious Services: 1 to 4 times per year    Active Member of [employer] or Organizations: No    Attends Banker Meetings: Never    Marital Status: Married     Family History: The patient's family history includes Diabetes in his sister; Lung cancer in his sister.  ROS:   Please see the history of present illness.     All other systems reviewed and are negative.  EKGs/Labs/Other Studies Reviewed:    The following studies were reviewed today:  Recent Labs: 09/08/2023: B Natriuretic Peptide 77.7 10/24/2023: ALT 14; BUN 24; Creatinine 0.97; Hemoglobin 11.9; Platelet Count 164; Potassium 4.1; Sodium 138  Recent Lipid Panel    Component Value Date/Time   CHOL 109 02/20/2023 0944   TRIG 97 02/20/2023 0944   HDL 36 (L) 02/20/2023 0944   CHOLHDL 3.0 02/20/2023 0944   CHOLHDL 3.7 05/02/2016 0856   VLDL 15 05/02/2016 0856   LDLCALC 54 02/20/2023 0944     Physical Exam:    VS:  BP 112/60   Pulse 72   Ht 6' (1.829 m)   Wt 208 lb 3.2 oz (94.4 kg)   SpO2 93%   BMI 28.24 kg/m     Wt Readings from Last 3 Encounters:  02/26/24 208 lb 3.2 oz (94.4 kg)  10/24/23 203 lb 4.8 oz (92.2 kg)  07/05/23 209 lb 9.6 oz (95.1 kg)   GEN:  Well nourished, well developed in no acute distress HEENT: Normal NECK: No carotid bruits CARDIAC: S1-S2 normal, RRR, no murmurs, rubs, gallops. 2+ raidal pulses, unable to palpate DP/PT pulses d/t gross swelling, feet warm. RESPIRATORY:  Expiratory wheezing across all posterior lung fields, no crackles, rales, rhonchi, does not appear to have an increased work of breathing at rest. MUSCULOSKELETAL:  2+ pitting dependent edema at bilateral ankles. No deformity  SKIN: Warm and dry NEUROLOGIC:  Alert and oriented x 3 PSYCHIATRIC:  Normal affect    ASSESSMENT:    1. HFrEF (heart failure with reduced ejection fraction) (HCC)   2. Coronary artery disease involving native coronary artery of native heart without angina pectoris   3. Aortic valve insufficiency, etiology of cardiac valve disease unspecified   4. Macrocytic anemia   5. Hyperlipidemia LDL goal <70   6. Interstitial lung disease (HCC)    PLAN:    In order of problems listed above:  Chronic HFrEF with improved EF - Left ventriculogram in 2009 revealed a moderately reduce LVSF, estimated EF at that time was 35-40%, with hypokinesis of the anterior wall, apex, and inferoapical wall. -Last echocardiogram in 08/2021 with reduced EF at 50%, G1 DD, moderately reduced RVSF.   -GDMT limited d/t history of hypotension, could consider in the future if BP's improve. - Lisinopril  stopped in December by cardiology due to hypotension - Patient was prescribed carvedilol  3.125 twice daily at his last visit, however has been taking 6.25 twice daily instead of splitting the pills.  Patient appears to be stable from a cardiovascular standpoint, orthostatic hypotension has improved per patient.  Will continue patient's current regimen of Coreg  6.25 twice daily. -Chronic bilateral LE edema unchanged -Continue torsemide  20 mg daily -Patient instructed to monitor for new weight gain of 3 pounds overnight, or 5 pounds in 1 week, and instructed to alert the office if he notices these changes. CAD without angina -Continue aspirin  81 mg daily -Denies chest pain Aortic  insufficiency  -Moderate aortic regurg on echo, with PHT measuring 478 msec. -Discussed with patient the possibility of repeating the echo to monitor valve function, we decided together to hold off unless symptoms worsen. With patient's extensive lung history he likely would not be a surgical candidate. Patient is in agreeable to plan and not currently interested in surgical interventions. -Will reevaluate patient's status and  condition at next visit. Macrocytic anemia -Will order CBC to monitor anemia and reassess WBCs following bronchitis -Will order fasting lipid and LFTs Hyperlipidemia LDL goal less than 70  -Continue atorvastatin  40 mg Interstitial lung disease - Currently being managed by [facility] pulmonology, most recent visit 07/05/2023. - On CellCept , currently stable in office.       Medication Adjustments/Labs and Tests Ordered: Current medicines are reviewed at length with the patient today.  Concerns regarding medicines are outlined above.  Orders Placed This Encounter  Procedures   CBC   Lipid Profile   Hepatic function panel   No orders of the defined types were placed in this encounter.   Patient Instructions  Medication Instructions:  Your physician recommends that you continue on your current medications as directed. Please refer to the Current Medication list given to you today.  *If you need a refill on your cardiac medications before your next appointment, please call your pharmacy*  Lab Work: TODAY:  CBC, LIPID, &  LFT  If you have labs (blood work) drawn today and your tests are completely normal, you will receive your results only by: MyChart Message (if you have MyChart) OR A paper copy in the mail If you have any lab test that is abnormal or we need to change your treatment, we will call you to review the results.  Testing/Procedures: None ordered  Follow-Up: At [facility], you and your health needs are our priority.  As part of our continuing mission to provide you with exceptional heart care, our providers are all part of one team.  This team includes your primary Cardiologist (physician) and Advanced Practice Providers or APPs (Physician Assistants and Nurse Practitioners) who all work together to provide you with the care you need, when you need it.  Your next appointment:   6 month(s)  Provider:   Oneil Parchment, MD    We recommend signing up for the  patient portal called MyChart.  Sign up information is provided on this After Visit Summary.  MyChart is used to connect with patients for Virtual Visits (Telemedicine).  Patients are able to view lab/test results, encounter notes, upcoming appointments, etc.  Non-urgent messages can be sent to your provider as well.   To learn more about what you can do with MyChart, go to [email or web address].   Other Instructions        Signed, Miriam FORBES Shams, NP  02/26/2024 1:30 PM    [facility]

## 2024-02-26 ENCOUNTER — Ambulatory Visit: Attending: Student | Admitting: Emergency Medicine

## 2024-02-26 ENCOUNTER — Encounter: Payer: Self-pay | Admitting: Emergency Medicine

## 2024-02-26 VITALS — BP 112/60 | HR 72 | Ht 72.0 in | Wt 208.2 lb

## 2024-02-26 DIAGNOSIS — D539 Nutritional anemia, unspecified: Secondary | ICD-10-CM | POA: Diagnosis not present

## 2024-02-26 DIAGNOSIS — I351 Nonrheumatic aortic (valve) insufficiency: Secondary | ICD-10-CM

## 2024-02-26 DIAGNOSIS — J849 Interstitial pulmonary disease, unspecified: Secondary | ICD-10-CM

## 2024-02-26 DIAGNOSIS — E785 Hyperlipidemia, unspecified: Secondary | ICD-10-CM

## 2024-02-26 DIAGNOSIS — I2510 Atherosclerotic heart disease of native coronary artery without angina pectoris: Secondary | ICD-10-CM

## 2024-02-26 DIAGNOSIS — I5020 Unspecified systolic (congestive) heart failure: Secondary | ICD-10-CM

## 2024-02-26 NOTE — Patient Instructions (Signed)
 Medication Instructions:  Your physician recommends that you continue on your current medications as directed. Please refer to the Current Medication list given to you today.  *If you need a refill on your cardiac medications before your next appointment, please call your pharmacy*  Lab Work: TODAY:  CBC, LIPID, &  LFT  If you have labs (blood work) drawn today and your tests are completely normal, you will receive your results only by: MyChart Message (if you have MyChart) OR A paper copy in the mail If you have any lab test that is abnormal or we need to change your treatment, we will call you to review the results.  Testing/Procedures: None ordered  Follow-Up: At [facility], you and your health needs are our priority.  As part of our continuing mission to provide you with exceptional heart care, our providers are all part of one team.  This team includes your primary Cardiologist (physician) and Advanced Practice Providers or APPs (Physician Assistants and Nurse Practitioners) who all work together to provide you with the care you need, when you need it.  Your next appointment:   6 month(s)  Provider:   Oneil Parchment, MD    We recommend signing up for the patient portal called MyChart.  Sign up information is provided on this After Visit Summary.  MyChart is used to connect with patients for Virtual Visits (Telemedicine).  Patients are able to view lab/test results, encounter notes, upcoming appointments, etc.  Non-urgent messages can be sent to your provider as well.   To learn more about what you can do with MyChart, go to [email or web address].   Other Instructions

## 2024-02-27 LAB — CBC
Hematocrit: 40.7 % (ref 37.5–51.0)
Hemoglobin: 13.1 g/dL (ref 13.0–17.7)
MCH: 34.9 pg — ABNORMAL HIGH (ref 26.6–33.0)
MCHC: 32.2 g/dL (ref 31.5–35.7)
MCV: 109 fL — ABNORMAL HIGH (ref 79–97)
Platelets: 172 x10E3/uL (ref 150–450)
RBC: 3.75 x10E6/uL — ABNORMAL LOW (ref 4.14–5.80)
RDW: 16.2 % — ABNORMAL HIGH (ref 11.6–15.4)
WBC: 9.4 x10E3/uL (ref 3.4–10.8)

## 2024-02-27 LAB — HEPATIC FUNCTION PANEL
ALT: 11 IU/L (ref 0–44)
AST: 18 IU/L (ref 0–40)
Albumin: 4.2 g/dL (ref 3.7–4.7)
Alkaline Phosphatase: 64 IU/L (ref 44–121)
Bilirubin Total: 1.1 mg/dL (ref 0.0–1.2)
Bilirubin, Direct: 0.38 mg/dL (ref 0.00–0.40)
Total Protein: 6 g/dL (ref 6.0–8.5)

## 2024-02-27 LAB — LIPID PANEL
Chol/HDL Ratio: 3.3 ratio (ref 0.0–5.0)
Cholesterol, Total: 109 mg/dL (ref 100–199)
HDL: 33 mg/dL — ABNORMAL LOW (ref >39)
LDL Chol Calc (NIH): 51 mg/dL (ref 0–99)
Triglycerides: 147 mg/dL (ref 0–149)
VLDL Cholesterol Cal: 25 mg/dL (ref 5–40)

## 2024-02-28 ENCOUNTER — Ambulatory Visit: Payer: Self-pay | Admitting: Emergency Medicine

## 2024-02-29 NOTE — Telephone Encounter (Signed)
 pt aware of results

## 2024-03-11 ENCOUNTER — Ambulatory Visit: Admitting: Pulmonary Disease

## 2024-03-11 ENCOUNTER — Encounter: Payer: Self-pay | Admitting: Pulmonary Disease

## 2024-03-11 VITALS — BP 106/66 | HR 77 | Temp 99.1°F | Ht 72.0 in | Wt 197.0 lb

## 2024-03-11 DIAGNOSIS — Z5181 Encounter for therapeutic drug level monitoring: Secondary | ICD-10-CM | POA: Diagnosis not present

## 2024-03-11 DIAGNOSIS — J849 Interstitial pulmonary disease, unspecified: Secondary | ICD-10-CM | POA: Diagnosis not present

## 2024-03-11 NOTE — Patient Instructions (Signed)
  VISIT SUMMARY: Today, we reviewed your interstitial lung disease and polymyalgia rheumatica. Your lung condition remains stable, and your joint pain is being managed with your current medications. We discussed your recent history of pneumonia and your current treatment plan.  YOUR PLAN: -INTERSTITIAL LUNG DISEASE: Interstitial lung disease is a condition where the tissue in your lungs becomes scarred and stiff, making it hard to breathe. Your condition is stable, and you should continue taking CellCept  as prescribed. We will schedule a follow-up CT scan and lung function test in six months to monitor your condition. If there are any signs of worsening, we may consider adding an anti-fibrotic medication.  -POLYMYALGIA RHEUMATICA: Polymyalgia rheumatica is an inflammatory disorder that causes muscle pain and stiffness, especially in the shoulders. Your condition is being managed with prednisone . You should continue taking 6 mg of prednisone  daily, divided into 5 mg in the morning and 1 mg at night, as reducing the dose has led to increased joint pain in the past.  INSTRUCTIONS: Please continue your current medications as prescribed. We will schedule a follow-up CT scan and lung function test in six months. If you experience any worsening of symptoms or new issues, please contact our office.

## 2024-03-11 NOTE — Progress Notes (Signed)
 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  with history of CHF, coronary artery disease.   Complains of worsening dyspnea on exertion for the past year.  Has occasional cough with mucus.  Denies any wheezing, fevers, chills He was treated for a community-acquired pneumonia in July 2022.  A follow-up CT showed interstitial changes with basilar fibrosis and mild groundglass opacity in the right upper lobe.  He has been referred here for further evaluation.  Developed COVID-19 and November 2022 treated with molnupiravir .  He also got a Z-Pak and prednisone  around that time Follows with Dr. Alveta for heart failure.  He is following with Dr. Mai, rheumatology for polymyalgia rheumatica and seronegative rheumatoid arthritis.  He is on chronic prednisone  at 6 mg from rheumatology  Started on CellCept  at 500 mg/day in January 2024.  Dose increased to 1gm daily in sept 2024  Received note from Dr. Mai dated 01/31/2022 Patient is being followed for polymyalgia rheumatica and has features of seronegative rheumatoid arthritis.  He had failed a trial of methotrexate previously Joint symptoms are stable at present Unable to taper prednisone  below 6 mg.   Interval history: Discussed the use of AI scribe software for clinical note transcription with the patient, who gave verbal consent to proceed.  History of Present Illness Jeffrey Stewart is an 89 year old male with interstitial lung disease and polymyalgia rheumatica who presents for follow-up of his lung condition.  Dyspnea and interstitial lung disease - Interstitial lung disease with stable symptoms since last CT scan in December 2024 -  Breathing is variable, with some days better than others - No current worsening of dyspnea - He tells me today that he recalls history of asbestos exposure from work with clutches and brake shoes - Removed down pillows and comforters in 2022  Arthralgia and polymyalgia rheumatica - Neck and joint pain present today, described as popping and creaking - Pain fluctuates, with some days pain-free - On mycophenolate  (CellCept ) 1000 mg twice daily and prednisone  6 mg daily (5 mg in the morning, 1 mg at night) - Attempts to reduce prednisone  below 6 mg result in increased joint pain  Infectious pulmonary complications - Two episodes of pneumonia in the past year - First episode required five-day hospitalization and was complicated by heart failure - Second episode treated with antibiotics and an injection    Relevant pulmonary history Pets: No pets Occupation: Used to work in [employer] when he was young.  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  No mold, hot tub, Jacuzzi.  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  He has feather pillows and comforters that he is used for many years.  He got rid of down pillows and comforters in September 2022 Smoking history: Never smoker Travel history: No significant travel history Relevant family history: No family history of lung disease  Outpatient Encounter Medications as of 03/11/2024  Medication Sig   acetaminophen  (TYLENOL ) 500 MG tablet Take 1,000 mg by mouth every 6 (six) hours as needed for moderate pain.   albuterol  (VENTOLIN  HFA) 108 (90 Base) MCG/ACT inhaler Inhale 2 puffs into the  lungs every 4 (four) hours as needed for wheezing or shortness of breath.   aspirin  EC 81 MG tablet Take 81 mg by mouth every evening.   atorvastatin  (LIPITOR) 40 MG tablet TAKE 1 TABLET AT BEDTIME   brimonidine  (ALPHAGAN ) 0.2 % ophthalmic solution Place 1 drop into both eyes 2 (two)  times daily.   cetirizine (ZYRTEC) 10 MG tablet Take 10 mg by mouth at bedtime.   Cholecalciferol  (VITAMIN D3) 1000 units CAPS Take 1,000 Units by mouth daily.   fish oil-omega-3 fatty acids  1000 MG capsule Take 1 g by mouth 2 (two) times daily.   fluticasone  (FLONASE ) 50 MCG/ACT nasal spray Place 1-2 sprays into both nostrils daily.   Fluticasone -Salmeterol (ADVAIR) 250-50 MCG/DOSE AEPB Inhale 1 puff into the lungs 2 (two) times daily.   fluticasone -salmeterol (WIXELA INHUB) 250-50 MCG/ACT AEPB Inhale 1 puff into the lungs in the morning and at bedtime.   glucosamine-chondroitin 500-400 MG tablet Take 1 tablet by mouth at bedtime.   ipratropium-albuterol  (DUONEB) 0.5-2.5 (3) MG/3ML SOLN Take 3 mLs by nebulization every 6 (six) hours as needed.   latanoprost  (XALATAN ) 0.005 % ophthalmic solution Place 1 drop into both eyes at bedtime.   metroNIDAZOLE  (METROCREAM ) 0.75 % cream Apply 1 application  topically 2 (two) times daily as needed (for rosacea).   minocycline  (DYNACIN ) 50 MG tablet Take 50 mg by mouth 2 (two) times daily.   NEOMYCIN -POLYMYXIN-HYDROCORTISONE  (CORTISPORIN) 1 % SOLN OTIC solution Place 1 drop into both ears as needed (when ears are stopped up).   Polyethyl Glycol-Propyl Glycol 0.4-0.3 % SOLN Place 1 drop into both eyes in the morning and at bedtime.   potassium chloride  SA (KLOR-CON  M) 20 MEQ tablet TAKE 1 TABLET EVERY DAY   predniSONE  (DELTASONE ) 1 MG tablet Take 1 mg by mouth at bedtime.   predniSONE  (DELTASONE ) 5 MG tablet Take 5 mg by mouth daily with breakfast.   sodium chloride  (OCEAN) 0.65 % nasal spray Place 1 spray into the nose as needed for congestion.   carvedilol  (COREG ) 6.25 MG tablet Take 6.25 mg by mouth 2 (two) times daily with a meal.   mycophenolate  (CELLCEPT ) 500 MG tablet TAKE 1 TABLET BY MOUTH TWICE A DAY (Patient taking differently: Take 1,000 mg by mouth 2 (two) times daily.)   nitroGLYCERIN  (NITROSTAT ) 0.4 MG SL tablet Place 1 tablet (0.4 mg total)  under the tongue every 5 (five) minutes as needed for chest pain.   torsemide  (DEMADEX ) 20 MG tablet Take 1 tablet (20 mg total) by mouth daily.   No facility-administered encounter medications on file as of 03/11/2024.    Vitals:   03/11/24 1542  BP: 106/66  Pulse: 77  Temp: 99.1 F (37.3 C)  Height: 6' (1.829 m)  Weight: 197 lb (89.4 kg)  SpO2: 97%  TempSrc: Oral  BMI (Calculated): 26.71    Physical Exam GEN: No acute distress CV: Regular rate and rhythm, no murmurs LUNGS: Wheeze present, otherwise clear to auscultation bilaterally, normal respiratory effort SKIN JOINTS: Warm and dry, no rash    Data Reviewed: Imaging: CT abdomen pelvis 08/28/2013-mild reticular changes at the base CT chest 01/31/2021-basal fibrotic changes with mild traction bronchiectasis, scattered groundglass pulmonary infiltrates in the posterior right upper lobe and the left lower lobe. 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  Probable bronchopneumonia in the right upper lobe with areas of consolidation and pre-existing region interstitial lung disease High-resolution CT 07/28/2021-improvement in inflammatory changes of pneumonia.  Stable probable UIP pattern pulmonary fibrosis  CT high-resolution 03/30/2022-minimal worsening of pulmonary fibrosis and probable UIP pattern CT high resolution 06/04/2023-stable pattern of interstitial lung disease and probable UIP pattern.  I have reviewed the images personally.  PFTs: 05/06/2021 FVC 2.39 (59%), FEV1 1.70 60%, F/F 71, TLC 4.67 64%, DLCO 12.91 52% Severe restriction, diffusion defect  07/28/2022 FVC 2.47 [62%], FEV1 1.76 [62%], F/F 71, TLC 4.81 [56%], DLCO 11.82 [48%] Severe restriction, diffusion defect  07/05/2023 FVC 2.43 (4%), FEV1 1.75 (6%), F/F72, TLC 4.90 (69%], DLCO 13.08 [55%] Moderate restriction, diffusion defect  Labs: CTD serologies 03/15/2021-  SCL 100- 25  Cardiac: Echocardiogram 09/18/2013 LV cavity is  dilated, EF 50-55%, moderate dilatation of RV cavity with mild reduction in systolic function.  Echocardiogram 08/25/2021 LVEF 50%, grade 1 diastolic dysfunction, normal RV systolic size and function.  Assessment & Plan Interstitial lung disease with history of asbestos exposure and seronegative inflammatory arthritis Differential diagnosis at this point is connective tissue disease, chronic hypersensitivity pneumonitis asbestos exposure.  IPF is also possible. He has gotten rid of his down comforter and pillows since September 2022 CTD serologies are negative borderline elevation in SCL 100 which is likely nonspecific as he does not have any symptoms.  However he is following with Dr. Mai, rheumatology for seronegative inflammatory arthritis and is on chronic prednisone  at 6 mg.  Currently on CellCept  which seems to have stabilized the disease.  Last CT scan in December 2024 showed stability. Liver function and blood counts from two weeks ago are within normal limits. - Continue CellCept  as current regimen - Order follow-up CT scan in six months - Order follow-up lung function test and breathing test in six months - Consider adding anti-fibrotic medication if future tests show worsening  Polymyalgia rheumatica on chronic immunosuppression Polymyalgia rheumatica is well-managed with chronic prednisone . Attempts to taper below 6 mg resulted in increased joint pain. Current regimen is 6 mg daily, divided into 5 mg in the morning and 1 mg at night. - Continue prednisone  6 mg daily, divided into 5 mg in the morning and 1 mg at night   Plan/Recommendations: Continue CellCept  1 g twice daily Monitor labs CT, PFTs in 6 months  Lonna Coder MD [facility] 03/11/2024, 4:12 PM  CC: Anita Bernardino [name], FNP  Addendum:

## 2024-03-18 ENCOUNTER — Telehealth: Payer: Self-pay | Admitting: Cardiovascular Disease

## 2024-03-18 MED ORDER — POTASSIUM CHLORIDE CRYS ER 20 MEQ PO TBCR: 20.0000 meq | EXTENDED_RELEASE_TABLET | Freq: Every day | ORAL | 3 refills | Status: AC

## 2024-03-18 NOTE — Telephone Encounter (Signed)
*  STAT* If patient is at the pharmacy, call can be transferred to refill team.   1. Which medications need to be refilled? (please list name of each medication and dose if known) potassium chloride  SA (KLOR-CON  M) 20 MEQ tablet    2. Would you like to learn more about the convenience, safety, & potential cost savings by using the [facility]?     3. Are you open to using the [facility] (Type [facility]. ).   4. Which pharmacy/location (including street and city if local pharmacy) is medication to be sent to? [facility] - [place], [place] - [address]    5. Do they need a 30 day or 90 day supply? 90 day

## 2024-03-18 NOTE — Telephone Encounter (Signed)
 Pt's medication was sent to pt's pharmacy as requested. Confirmation received.

## 2024-04-02 ENCOUNTER — Other Ambulatory Visit: Payer: Self-pay

## 2024-04-02 MED ORDER — CARVEDILOL 6.25 MG PO TABS: 6.2500 mg | ORAL_TABLET | Freq: Two times a day (BID) | ORAL | 3 refills | Status: DC

## 2024-04-06 ENCOUNTER — Emergency Department (HOSPITAL_COMMUNITY)

## 2024-04-06 ENCOUNTER — Encounter (HOSPITAL_COMMUNITY): Payer: Self-pay

## 2024-04-06 ENCOUNTER — Inpatient Hospital Stay (HOSPITAL_COMMUNITY)
Admission: EM | Admit: 2024-04-06 | Discharge: 2024-04-12 | DRG: 481 | Disposition: A | Discharge status: Home Health | Attending: Internal Medicine | Admitting: Internal Medicine

## 2024-04-06 ENCOUNTER — Other Ambulatory Visit: Payer: Self-pay

## 2024-04-06 DIAGNOSIS — M542 Cervicalgia: Secondary | ICD-10-CM | POA: Diagnosis present

## 2024-04-06 DIAGNOSIS — Z7951 Long term (current) use of inhaled steroids: Secondary | ICD-10-CM

## 2024-04-06 DIAGNOSIS — J849 Interstitial pulmonary disease, unspecified: Secondary | ICD-10-CM | POA: Diagnosis present

## 2024-04-06 DIAGNOSIS — Y92009 Unspecified place in unspecified non-institutional (private) residence as the place of occurrence of the external cause: Secondary | ICD-10-CM | POA: Diagnosis not present

## 2024-04-06 DIAGNOSIS — D72829 Elevated white blood cell count, unspecified: Secondary | ICD-10-CM | POA: Diagnosis present

## 2024-04-06 DIAGNOSIS — H409 Unspecified glaucoma: Secondary | ICD-10-CM | POA: Diagnosis present

## 2024-04-06 DIAGNOSIS — I252 Old myocardial infarction: Secondary | ICD-10-CM

## 2024-04-06 DIAGNOSIS — S72141A Displaced intertrochanteric fracture of right femur, initial encounter for closed fracture: Principal | ICD-10-CM | POA: Diagnosis present

## 2024-04-06 DIAGNOSIS — E440 Moderate protein-calorie malnutrition: Secondary | ICD-10-CM | POA: Diagnosis present

## 2024-04-06 DIAGNOSIS — E782 Mixed hyperlipidemia: Secondary | ICD-10-CM | POA: Diagnosis not present

## 2024-04-06 DIAGNOSIS — Z79899 Other long term (current) drug therapy: Secondary | ICD-10-CM

## 2024-04-06 DIAGNOSIS — Z77090 Contact with and (suspected) exposure to asbestos: Secondary | ICD-10-CM | POA: Diagnosis present

## 2024-04-06 DIAGNOSIS — K118 Other diseases of salivary glands: Secondary | ICD-10-CM | POA: Diagnosis present

## 2024-04-06 DIAGNOSIS — S0990XA Unspecified injury of head, initial encounter: Secondary | ICD-10-CM

## 2024-04-06 DIAGNOSIS — Z6826 Body mass index (BMI) 26.0-26.9, adult: Secondary | ICD-10-CM | POA: Diagnosis not present

## 2024-04-06 DIAGNOSIS — Z7952 Long term (current) use of systemic steroids: Secondary | ICD-10-CM

## 2024-04-06 DIAGNOSIS — I2510 Atherosclerotic heart disease of native coronary artery without angina pectoris: Secondary | ICD-10-CM | POA: Diagnosis present

## 2024-04-06 DIAGNOSIS — W1830XA Fall on same level, unspecified, initial encounter: Secondary | ICD-10-CM | POA: Diagnosis present

## 2024-04-06 DIAGNOSIS — Z7982 Long term (current) use of aspirin: Secondary | ICD-10-CM | POA: Diagnosis not present

## 2024-04-06 DIAGNOSIS — Z833 Family history of diabetes mellitus: Secondary | ICD-10-CM

## 2024-04-06 DIAGNOSIS — D62 Acute posthemorrhagic anemia: Secondary | ICD-10-CM | POA: Insufficient documentation

## 2024-04-06 DIAGNOSIS — E785 Hyperlipidemia, unspecified: Secondary | ICD-10-CM | POA: Diagnosis present

## 2024-04-06 DIAGNOSIS — Z7401 Bed confinement status: Secondary | ICD-10-CM | POA: Diagnosis not present

## 2024-04-06 DIAGNOSIS — Z888 Allergy status to other drugs, medicaments and biological substances status: Secondary | ICD-10-CM | POA: Diagnosis not present

## 2024-04-06 DIAGNOSIS — I110 Hypertensive heart disease with heart failure: Secondary | ICD-10-CM | POA: Diagnosis present

## 2024-04-06 DIAGNOSIS — J45909 Unspecified asthma, uncomplicated: Secondary | ICD-10-CM | POA: Diagnosis present

## 2024-04-06 DIAGNOSIS — W19XXXA Unspecified fall, initial encounter: Secondary | ICD-10-CM | POA: Diagnosis present

## 2024-04-06 DIAGNOSIS — S72009A Fracture of unspecified part of neck of unspecified femur, initial encounter for closed fracture: Secondary | ICD-10-CM | POA: Diagnosis present

## 2024-04-06 DIAGNOSIS — Z801 Family history of malignant neoplasm of trachea, bronchus and lung: Secondary | ICD-10-CM

## 2024-04-06 DIAGNOSIS — Z79624 Long term (current) use of inhibitors of nucleotide synthesis: Secondary | ICD-10-CM

## 2024-04-06 DIAGNOSIS — L719 Rosacea, unspecified: Secondary | ICD-10-CM | POA: Diagnosis present

## 2024-04-06 DIAGNOSIS — I5042 Chronic combined systolic (congestive) and diastolic (congestive) heart failure: Secondary | ICD-10-CM | POA: Diagnosis present

## 2024-04-06 DIAGNOSIS — I4510 Unspecified right bundle-branch block: Secondary | ICD-10-CM | POA: Diagnosis present

## 2024-04-06 DIAGNOSIS — S72001A Fracture of unspecified part of neck of right femur, initial encounter for closed fracture: Secondary | ICD-10-CM

## 2024-04-06 DIAGNOSIS — R531 Weakness: Secondary | ICD-10-CM | POA: Diagnosis not present

## 2024-04-06 LAB — COMPREHENSIVE METABOLIC PANEL WITH GFR
ALT: 19 U/L (ref 0–44)
AST: 21 U/L (ref 15–41)
Albumin: 3.3 g/dL — ABNORMAL LOW (ref 3.5–5.0)
Alkaline Phosphatase: 58 U/L (ref 38–126)
Anion gap: 13 (ref 5–15)
BUN: 28 mg/dL — ABNORMAL HIGH (ref 8–23)
CO2: 27 mmol/L (ref 22–32)
Calcium: 8.9 mg/dL (ref 8.9–10.3)
Chloride: 101 mmol/L (ref 98–111)
Creatinine, Ser: 1.03 mg/dL (ref 0.61–1.24)
GFR, Estimated: >60 mL/min (ref >60)
Glucose, Bld: 110 mg/dL — ABNORMAL HIGH (ref 70–99)
Potassium: 4.1 mmol/L (ref 3.5–5.1)
Sodium: 141 mmol/L (ref 135–145)
Total Bilirubin: 1.3 mg/dL — ABNORMAL HIGH (ref 0.0–1.2)
Total Protein: 5.6 g/dL — ABNORMAL LOW (ref 6.5–8.1)

## 2024-04-06 LAB — CBC WITH DIFFERENTIAL/PLATELET
Abs Immature Granulocytes: 0.24 K/uL — ABNORMAL HIGH (ref 0.00–0.07)
Basophils Absolute: 0.1 K/uL (ref 0.0–0.1)
Basophils Relative: 0 %
Eosinophils Absolute: 0 K/uL (ref 0.0–0.5)
Eosinophils Relative: 0 %
HCT: 38.9 % — ABNORMAL LOW (ref 39.0–52.0)
Hemoglobin: 12.9 g/dL — ABNORMAL LOW (ref 13.0–17.0)
Immature Granulocytes: 2 %
Lymphocytes Relative: 10 %
Lymphs Abs: 1.6 K/uL (ref 0.7–4.0)
MCH: 36.1 pg — ABNORMAL HIGH (ref 26.0–34.0)
MCHC: 33.2 g/dL (ref 30.0–36.0)
MCV: 109 fL — ABNORMAL HIGH (ref 80.0–100.0)
Monocytes Absolute: 0.9 K/uL (ref 0.1–1.0)
Monocytes Relative: 6 %
Neutro Abs: 13.4 K/uL — ABNORMAL HIGH (ref 1.7–7.7)
Neutrophils Relative %: 82 %
Platelets: 113 K/uL — ABNORMAL LOW (ref 150–400)
RBC: 3.57 MIL/uL — ABNORMAL LOW (ref 4.22–5.81)
RDW: 17.2 % — ABNORMAL HIGH (ref 11.5–15.5)
WBC: 16.2 K/uL — ABNORMAL HIGH (ref 4.0–10.5)
nRBC: 0 % (ref 0.0–0.2)

## 2024-04-06 LAB — TYPE AND SCREEN
ABO/RH(D): O POS
Antibody Screen: NEGATIVE

## 2024-04-06 LAB — PROTIME-INR
INR: 1 (ref 0.8–1.2)
Prothrombin Time: 13.8 s (ref 11.4–15.2)

## 2024-04-06 MED ORDER — ATORVASTATIN CALCIUM 40 MG PO TABS
40.0000 mg | ORAL_TABLET | Freq: Every day | ORAL | Status: DC
  Administered 2024-04-06 – 2024-04-11 (×6): 40 mg via ORAL
  Filled 2024-04-06 (×6): qty 1

## 2024-04-06 MED ORDER — IPRATROPIUM-ALBUTEROL 0.5-2.5 (3) MG/3ML IN SOLN
3.0000 mL | Freq: Once | RESPIRATORY_TRACT | Status: AC
  Administered 2024-04-06: 3 mL via RESPIRATORY_TRACT
  Filled 2024-04-06: qty 3

## 2024-04-06 MED ORDER — OXYCODONE HCL 5 MG PO TABS
5.0000 mg | ORAL_TABLET | ORAL | Status: DC | PRN
  Administered 2024-04-06 – 2024-04-07 (×3): 10 mg via ORAL
  Filled 2024-04-06 (×3): qty 2

## 2024-04-06 MED ORDER — DOCUSATE SODIUM 100 MG PO CAPS
100.0000 mg | ORAL_CAPSULE | Freq: Two times a day (BID) | ORAL | Status: DC
Start: 2024-04-06 — End: 2024-04-07
  Administered 2024-04-06 – 2024-04-07 (×2): 100 mg via ORAL
  Filled 2024-04-06 (×2): qty 1

## 2024-04-06 MED ORDER — FLUTICASONE PROPIONATE 50 MCG/ACT NA SUSP
1.0000 | Freq: Every day | NASAL | Status: DC
  Administered 2024-04-07: 2 via NASAL
  Administered 2024-04-08 – 2024-04-11 (×4): 1 via NASAL
  Filled 2024-04-06: qty 16

## 2024-04-06 MED ORDER — BRIMONIDINE TARTRATE 0.2 % OP SOLN
1.0000 [drp] | Freq: Two times a day (BID) | OPHTHALMIC | Status: DC
  Administered 2024-04-07 – 2024-04-12 (×9): 1 [drp] via OPHTHALMIC
  Filled 2024-04-06 (×2): qty 5

## 2024-04-06 MED ORDER — PREDNISONE 5 MG PO TABS
5.0000 mg | ORAL_TABLET | Freq: Every day | ORAL | Status: DC
  Administered 2024-04-07 – 2024-04-12 (×6): 5 mg via ORAL
  Filled 2024-04-06 (×6): qty 1

## 2024-04-06 MED ORDER — ALBUTEROL SULFATE (2.5 MG/3ML) 0.083% IN NEBU
2.5000 mg | INHALATION_SOLUTION | RESPIRATORY_TRACT | Status: DC | PRN
  Administered 2024-04-11: 2.5 mg via RESPIRATORY_TRACT
  Filled 2024-04-06: qty 3

## 2024-04-06 MED ORDER — BISACODYL 5 MG PO TBEC: 5.0000 mg | DELAYED_RELEASE_TABLET | Freq: Every day | ORAL | Status: DC | PRN

## 2024-04-06 MED ORDER — PREDNISONE 1 MG PO TABS
1.0000 mg | ORAL_TABLET | Freq: Every day | ORAL | Status: DC
  Administered 2024-04-06 – 2024-04-11 (×6): 1 mg via ORAL
  Filled 2024-04-06 (×8): qty 1

## 2024-04-06 MED ORDER — FLUTICASONE FUROATE-VILANTEROL 100-25 MCG/ACT IN AEPB
1.0000 | INHALATION_SPRAY | Freq: Two times a day (BID) | RESPIRATORY_TRACT | Status: DC
Start: 2024-04-07 — End: 2024-04-12
  Administered 2024-04-07 – 2024-04-12 (×10): 1 via RESPIRATORY_TRACT
  Filled 2024-04-06 (×2): qty 28

## 2024-04-06 MED ORDER — POTASSIUM CHLORIDE CRYS ER 20 MEQ PO TBCR
20.0000 meq | EXTENDED_RELEASE_TABLET | Freq: Every day | ORAL | Status: DC
Start: 2024-04-07 — End: 2024-04-12
  Administered 2024-04-07 – 2024-04-12 (×6): 20 meq via ORAL
  Filled 2024-04-06 (×6): qty 1

## 2024-04-06 MED ORDER — LORATADINE 10 MG PO TABS
10.0000 mg | ORAL_TABLET | Freq: Every day | ORAL | Status: DC
  Administered 2024-04-06 – 2024-04-11 (×6): 10 mg via ORAL
  Filled 2024-04-06 (×6): qty 1

## 2024-04-06 MED ORDER — MORPHINE SULFATE (PF) 4 MG/ML IV SOLN
2.0000 mg | Freq: Once | INTRAVENOUS | Status: AC
  Administered 2024-04-06: 2 mg via INTRAVENOUS
  Filled 2024-04-06: qty 1

## 2024-04-06 MED ORDER — ONDANSETRON HCL 4 MG/2ML IJ SOLN
4.0000 mg | Freq: Once | INTRAMUSCULAR | Status: AC
  Administered 2024-04-06: 4 mg via INTRAVENOUS
  Filled 2024-04-06: qty 2

## 2024-04-06 MED ORDER — ONDANSETRON HCL 4 MG/2ML IJ SOLN: 4.0000 mg | Freq: Four times a day (QID) | INTRAMUSCULAR | Status: DC | PRN

## 2024-04-06 MED ORDER — CARVEDILOL 6.25 MG PO TABS
6.2500 mg | ORAL_TABLET | Freq: Two times a day (BID) | ORAL | Status: DC
Start: 2024-04-07 — End: 2024-04-10
  Administered 2024-04-07 – 2024-04-10 (×4): 6.25 mg via ORAL
  Filled 2024-04-06 (×5): qty 1

## 2024-04-06 MED ORDER — SALINE SPRAY 0.65 % NA SOLN: 1.0000 | NASAL | Status: DC | PRN

## 2024-04-06 MED ORDER — ACETAMINOPHEN 325 MG PO TABS: 650.0000 mg | ORAL_TABLET | Freq: Four times a day (QID) | ORAL | Status: DC | PRN

## 2024-04-06 MED ORDER — MYCOPHENOLATE MOFETIL 250 MG PO CAPS
1000.0000 mg | ORAL_CAPSULE | Freq: Two times a day (BID) | ORAL | Status: DC
Start: 2024-04-06 — End: 2024-04-12
  Administered 2024-04-06 – 2024-04-12 (×12): 1000 mg via ORAL
  Filled 2024-04-06 (×15): qty 4

## 2024-04-06 MED ORDER — LATANOPROST 0.005 % OP SOLN
1.0000 [drp] | Freq: Every day | OPHTHALMIC | Status: DC
  Administered 2024-04-06 – 2024-04-11 (×6): 1 [drp] via OPHTHALMIC
  Filled 2024-04-06: qty 2.5

## 2024-04-06 MED ORDER — TORSEMIDE 20 MG PO TABS
20.0000 mg | ORAL_TABLET | Freq: Every day | ORAL | Status: DC
  Administered 2024-04-08 – 2024-04-12 (×5): 20 mg via ORAL
  Filled 2024-04-06 (×6): qty 1

## 2024-04-06 MED ORDER — CEFAZOLIN SODIUM-DEXTROSE 2-4 GM/100ML-% IV SOLN
2.0000 g | INTRAVENOUS | Status: DC
  Filled 2024-04-06: qty 100

## 2024-04-06 MED ORDER — MORPHINE SULFATE (PF) 2 MG/ML IV SOLN
2.0000 mg | INTRAVENOUS | Status: DC | PRN
  Administered 2024-04-06 (×2): 2 mg via INTRAVENOUS
  Filled 2024-04-06 (×2): qty 1

## 2024-04-06 MED ORDER — IPRATROPIUM-ALBUTEROL 0.5-2.5 (3) MG/3ML IN SOLN: 3.0000 mL | Freq: Four times a day (QID) | RESPIRATORY_TRACT | Status: DC | PRN

## 2024-04-06 MED ORDER — ASPIRIN 81 MG PO TBEC
81.0000 mg | DELAYED_RELEASE_TABLET | Freq: Every day | ORAL | Status: DC
  Administered 2024-04-06: 81 mg via ORAL
  Filled 2024-04-06: qty 1

## 2024-04-06 MED ORDER — METHOCARBAMOL 1000 MG/10ML IJ SOLN: 500.0000 mg | Freq: Four times a day (QID) | INTRAMUSCULAR | Status: DC | PRN

## 2024-04-06 MED ORDER — TRANEXAMIC ACID-NACL 1000-0.7 MG/100ML-% IV SOLN
1000.0000 mg | INTRAVENOUS | Status: AC
Start: 2024-04-07 — End: 2024-04-08
  Administered 2024-04-07: 1000 mg via INTRAVENOUS
  Filled 2024-04-06: qty 100

## 2024-04-06 MED ORDER — METHOCARBAMOL 500 MG PO TABS
500.0000 mg | ORAL_TABLET | Freq: Four times a day (QID) | ORAL | Status: DC | PRN
  Administered 2024-04-06 – 2024-04-07 (×2): 500 mg via ORAL
  Filled 2024-04-06 (×2): qty 1

## 2024-04-06 MED ORDER — POLYETHYLENE GLYCOL 3350 17 G PO PACK: 17.0000 g | PACK | Freq: Every day | ORAL | Status: DC | PRN

## 2024-04-06 NOTE — Consult Note (Signed)
 Orthopedic Surgery Consult Note  Assessment: Patient is a 89 y.o. male with right intertrochanteric femur fracture   Plan: -Planning for operative fixation evening of 9/22 -Diet: NPO for procedure -DVT ppx: aspirin  81mg  BID post-operatively -Ancef  and TXA on call to OR -Weight bearing status: NWB RLE -PT evaluate and treat post-operatively -Pain control -Dispo: pending completion of operative plans   Discussed recommendation for operative intervention in the form of right intertrochanteric femur fracture open reduction internal fixation with cephalomedullary rod. Explained the risks of this procedure included, but were not limited to: nonunion, malunion, hardware failure/malposition, infection, bleeding, stiffness, screw cut out, need for additional procedures, deep vein thrombosis, pulmonary embolism, and death. The benefits of this procedure would be to promote fracture healing by providing stability, to heal the fracture in the appropriate alignment, and to allow for early mobilization. The alternatives of this surgery would be to treat the fracture with traction, bedrest, or to do no intervention. The patient's questions were answered to his satisfaction. After this discussion, patient elected to proceed with surgery. Informed consent was obtained.   ___________________________________________________________________________   Reason for consult: right intertrochanteric femur fracture  History:  Patient is a 89 y.o. male who had a ground-level fall at his home on 04/06/2024.  He noted immediate onset of right hip pain.  He presented to [facility] emergency department.  He was found to have a right intertrochanteric femur fracture.  He was admitted to the medicine service and transferred to [facility] for definitive surgical management.  Patient is reporting right hip pain but no pain elsewhere.  Review of systems: General: denies fevers and chills, myalgias Neurologic:  denies recent changes in vision, slurred speech Abdomen: denies nausea, vomiting, hematemesis Respiratory: denies cough, shortness of breath  Past medical history:  CHF CAD History of MI HLD SOB Rosacea  Allergies: meloxicam, niacin    Past surgical history:  Appendectomy Hernia repair Cardiac cath   Social history: Denies use of nicotine-containing products (cigarettes, vaping, smokeless, etc.) Alcohol  use: denies Denies use of recreational drugs  Family history: -reviewed and not pertinent to intertrochanteric hip fracture   Physical Exam:  BMI of 26.6  General: no acute distress, appears stated age Neurologic: alert, answering questions appropriately, following commands Cardiovascular: regular rate, no cyanosis Respiratory: unlabored breathing on room air, symmetric chest rise Psychiatric: appropriate affect, normal cadence to speech  MSK:   -Bilateral upper extremities  No tenderness to palpation over extremity, no gross deformity, no open wounds Fires deltoid, biceps, triceps, wrist extensors, wrist flexors, finger extensors, finger flexors  AIN/PIN/IO intact  Palpable radial pulse  Sensation intact to light touch in median/ulnar/radial/axillary nerve distributions  Hand warm and well perfused  -Right lower extremity  TTP around the hip, no other tenderness palpation over the remainder of the extremity, pain with logroll, no open wounds Does not fire hip flexors, hamstrings, or quadriceps due to pain.  EHL/TA/GSC intact Plantarflexes and dorsiflexes toes Sensation intact to light touch in sural, saphenous, tibial, deep peroneal, and superficial peroneal nerve distributions Foot warm and well perfused  -Left lower extremity  No tenderness to palpation over extremity, no gross deformity, no pain with logroll, no open wounds Fires hip flexors, quadriceps, hamstrings, tibialis anterior, gastrocnemius and soleus, extensor hallucis longus Plantarflexes and  dorsiflexes toes Sensation intact to light touch in sural, saphenous, tibial, deep peroneal, and superficial peroneal nerve distributions Foot warm and well perfused  Imaging: XRs of the right hip from 04/06/2024 were independently reviewed and interpreted,  showing a minimally displaced right intertrochanteric femur fracture.  The fracture is oblique and seen extending through the midportion of the greater and lesser trochanters.  No other fracture seen.  No dislocation seen.   Patient name: Jeffrey Stewart Patient MRN: [id number] Date: 04/07/2024

## 2024-04-06 NOTE — Op Note (Signed)
 Orthopedic Surgery Operative Report   Procedure: Right intertrochanteric fracture open reduction internal fixation with intramedullary rodding   Modifier: none   Date of procedure: 04/07/2024   Patient name: Jeffrey Stewart MRN: [id number] DOB: January 27, 1935  Surgeon: Ozell Ada, MD Assistant: none Pre-operative diagnosis: right intertrochanteric hip fracture Post-operative diagnosis: same as above Findings: right intertrochanteric femur fracture   Specimens: none Anesthesia: general EBL: 250cc Complications: none Pre-incision antibiotic: ancef  TXA was given prior to incision as well   Implants:  Implant Name Type Inv. Item Serial No. Manufacturer Lot No. LRB No. Used Action  NAIL LESTER GAILS [id number] - [id number] Nail NAIL TRIGEN INTERTAN [id number]  [employer] [id number] Right 1 Implanted  SCREW LAG COMPR KIT 110/105 - [id number] Screw SCREW LAG COMPR KIT 110/105  [employer] [id number] Right 1 Implanted  SCREW TRIGEN LOW PROF 5.0X32.5 - [id number] Screw SCREW TRIGEN LOW PROF 5.0X32.5  [employer] [id number] Right 1 Implanted      Indication for procedure: Patient is a 89 y.o. male who presented to the ER after a  ground level fall at home. The patient had right hip pain and x-rays revealed an intertrochanteric femur fracture. The patient was admitted to a medicine service with orthopedics consulted. I met the patient and discussed the fracture. I recommended operative management in the form of intramedullary rodding to stabilize the fracture and allow for mobilization. Explained the risks of this procedure included, but were not limited to: nonunion, malunion, fixation failure, infection, bleeding, stiffness, need for additional procedures, deep vein thrombosis, pulmonary embolism, MI, arrhythmia, and death. The alternatives of this surgery would be to treat the fracture with traction, bedrest, or to perform no intervention. After our  discussion, patient elected to proceed with surgery.    Procedure Description: The patient was met in the pre-operative holding area. The patient's identity and consent were verified. The operative site was marked by myself. The patient's remaining questions about the surgery were answered. The patient was brought back to the operating room. General anesthesia was induced and an endotracheal tube was placed by the anesthesia staff. The patient was transferred to the [facility] table. All bony prominences were well padded. Traction was applied and reduction was attempted with manipulation. Fluoroscopy confirmed a satisfactory reduction. The surgical area was cleansed with alcohol . Ancef  and TXA were administered by anesthesia. The patient's skin was then prepped and draped in a standard, sterile fashion. A time out was performed that identified the patient, the procedure, and the operative site. All team members agreed with what was stated in the time out.    An incision was made just proximal and inferior to the greater trochanter. The incision was taken sharply down through the fascia. A guide pin was inserted into the wound onto the top of the greater trochanter. Fluoroscopy was used to place the guide pin at the starting point at the tip of the greater trochanter and in line with the middle of the femoral neck. The wire was then advanced to a point just past the lesser trochanter. A soft tissue sleeve was advanced over the wire onto the greater trochanter. An entry reamer was used to open the proximal femoral canal under fluoroscopic guidance. The pin and reamer were removed. A long guide wire was placed down the femoral canal. A 10x18cm nail was advanced over the guidewire under fluoroscopic guidance. The guidewire was removed.    An incision was made sharply through the skin, dermis, and fascia  over the lateral thigh in the area where the lag screws would be inserted. The lag screw targeter was placed through  the jig onto the lateral femoral cortex. A guide wire was advanced through the lag screw targeter into the femoral head under fluoroscopic guidance. It was found to be in acceptable position on the AP and lateral views. The length of the lag screw was estimated off of the guide wire. A screw was selected. The inferior lag screw was drilled through the guide. The derotation device was placed through the targeter. The proximal lag screw hole was then drilled over the guide wire. The screw was inserted over the wire under fluoroscopic guidance. The derotation bar was removed and the inferior lag screw was inserted. The inferior lag screw was tightened to get 5mm of compression. AP and lateral fluoroscopic images confirmed satisfactory position of the screws in the femoral head.   An incision was made over the distal interlocking screw of the nail. Incision was taken sharply down through the skin, dermis, and fascia. A targeter was placed through the jig onto the lateral femoral cortex. A drill was used to drill the femur bicortically through the nail. The length of the screw was estimated off the drill. A 32.29mm screw was selected and inserted through the femur and distal hole of the nail. The jig was removed from the nail. Final AP and lateral fluoroscopic images confirmed satisfactory reduction and position of the fixation.    The wounds were copiously irrigated with sterile saline. Vancomycin  powder was placed into the wounds. The fascia was closed with 0 vicryl. The deep dermal layer was closed with 2-0 vicryl. The skin was closed with staples. Dressings were applied. All counts were correct at the end of the case. Patient was transferred back to a hospital bed. The patient was awakened from anesthesia and brought back to the post-anesthesia care unit in stable condition.     Post-operative plan: The patient will recover in the post-anesthesia care unit and then go to the floor on the medicine service.  The patient will receive two post-operative doses of ancef . He will get another dose of TXA. The patient will be weight bearing as tolerated. The patient will work with physical therapy. The patient's disposition will be determined by the medicine service.        Ozell Ada, MD Orthopedic Surgeon

## 2024-04-06 NOTE — ED Triage Notes (Signed)
 Pt [facility] EMS for witnessed fall; pt states he tripped over trash can. Denies LOC. Bruise noted on right side, occipital. c/o rt hip pain, radiating down the leg. Pt states 4/10. Pt denise N/V/D, headache. A&Ox4

## 2024-04-06 NOTE — ED Provider Notes (Signed)
 [place] EMERGENCY DEPARTMENT AT [facility] Provider Note   CSN: [id number] Arrival date & time: 04/06/24  1426     Patient presents with: Jeffrey Stewart is a 89 y.o. male.   Patient is an 89 year old male who presents emergency department after a fall which occurred just prior to arrival.  Patient notes that he was in his garage when he tripped over a trash can.  He notes that he fell onto his right side and did strike his head.  He is currently complaining of pain to the posterior aspect of the head as well as to the right hip.  He denies any other long bone or joint pain at this time.  He denies any pain to his chest or abdomen.  He notes that there was no associated dizziness, lightheadedness or syncope.  He denies any associated nausea or vomiting.   Fall Associated symptoms include headaches.       Prior to Admission medications   Medication Sig Start Date End Date Taking? Authorizing Provider  acetaminophen  (TYLENOL ) 500 MG tablet Take 1,000 mg by mouth every 6 (six) hours as needed for moderate pain.    [provider]  albuterol  (VENTOLIN  HFA) 108 (90 Base) MCG/ACT inhaler Inhale 2 puffs into the lungs every 4 (four) hours as needed for wheezing or shortness of breath. 06/19/22   Steinl, Kevin, MD  aspirin  EC 81 MG tablet Take 81 mg by mouth every evening.    [provider]  atorvastatin  (LIPITOR) 40 MG tablet TAKE 1 TABLET AT BEDTIME 08/01/23   Nahser, Aleene PARAS, MD  brimonidine  (ALPHAGAN ) 0.2 % ophthalmic solution Place 1 drop into both eyes 2 (two) times daily. 07/20/21   [provider]  carvedilol  (COREG ) 6.25 MG tablet Take 1 tablet (6.25 mg total) by mouth 2 (two) times daily with a meal. 04/02/24   Patwardhan, Manish J, MD  cetirizine (ZYRTEC) 10 MG tablet Take 10 mg by mouth at bedtime.    [provider]  Cholecalciferol  (VITAMIN D3) 1000 units CAPS Take 1,000 Units by mouth daily.    [provider]  fish  oil-omega-3 fatty acids  1000 MG capsule Take 1 g by mouth 2 (two) times daily.    [provider]  fluticasone  (FLONASE ) 50 MCG/ACT nasal spray Place 1-2 sprays into both nostrils daily.    [provider]  Fluticasone -Salmeterol (ADVAIR) 250-50 MCG/DOSE AEPB Inhale 1 puff into the lungs 2 (two) times daily. 05/12/19   [provider]  fluticasone -salmeterol (WIXELA INHUB) 250-50 MCG/ACT AEPB Inhale 1 puff into the lungs in the morning and at bedtime.    [provider]  glucosamine-chondroitin 500-400 MG tablet Take 1 tablet by mouth at bedtime.    [provider]  ipratropium-albuterol  (DUONEB) 0.5-2.5 (3) MG/3ML SOLN Take 3 mLs by nebulization every 6 (six) hours as needed. 07/11/21   [provider]  latanoprost  (XALATAN ) 0.005 % ophthalmic solution Place 1 drop into both eyes at bedtime. 06/27/21   [provider]  metroNIDAZOLE  (METROCREAM ) 0.75 % cream Apply 1 application  topically 2 (two) times daily as needed (for rosacea).    [provider]  minocycline  (DYNACIN ) 50 MG tablet Take 50 mg by mouth 2 (two) times daily.    [provider]  mycophenolate  (CELLCEPT ) 500 MG tablet TAKE 1 TABLET BY MOUTH TWICE A DAY Patient taking differently: Take 1,000 mg by mouth 2 (two) times daily. 09/17/23   Mannam, Praveen, MD  NEOMYCIN -POLYMYXIN-HYDROCORTISONE  (CORTISPORIN) 1 % SOLN OTIC solution Place 1 drop into both ears as needed (when ears are stopped up). 05/13/19   [provider]  nitroGLYCERIN  (NITROSTAT ) 0.4 MG SL tablet Place 1 tablet (0.4 mg total) under the tongue every 5 (five) minutes as needed for chest pain. 02/14/22   Nahser, Aleene PARAS, MD  Polyethyl Glycol-Propyl Glycol 0.4-0.3 % SOLN Place 1 drop into both eyes in the morning and at bedtime.    [provider]  potassium chloride  SA (KLOR-CON  M) 20 MEQ tablet Take 1 tablet (20 mEq total) by mouth daily. 03/18/24   Campbell, Kenzie E, NP   predniSONE  (DELTASONE ) 1 MG tablet Take 1 mg by mouth at bedtime. 05/19/23   [provider]  predniSONE  (DELTASONE ) 5 MG tablet Take 5 mg by mouth daily with breakfast.    [provider]  sodium chloride  (OCEAN) 0.65 % nasal spray Place 1 spray into the nose as needed for congestion.    [provider]  torsemide  (DEMADEX ) 20 MG tablet Take 1 tablet (20 mg total) by mouth daily. 01/31/24   Nahser, Aleene PARAS, MD    Allergies: Niaspan  [niacin ] and Meloxicam    Review of Systems  Musculoskeletal:        Pain to right hip  Neurological:  Positive for headaches.  All other systems reviewed and are negative.   Updated Vital Signs BP 121/68 (BP Location: Right Arm)   Pulse 94   Temp 98.2 F (36.8 C) (Oral)   Resp (!) 25   Ht 6' (1.829 m)   Wt 88.9 kg   SpO2 96%   BMI 26.58 kg/m   Physical Exam Vitals and nursing note reviewed.  Constitutional:      General: He is not in acute distress.    Appearance: Normal appearance. He is not ill-appearing.  HENT:     Head: Normocephalic and atraumatic.     Comments: Small hematoma noted to right occipital region    Nose: Nose normal.     Mouth/Throat:     Mouth: Mucous membranes are moist.  Eyes:     Extraocular Movements: Extraocular movements intact.     Conjunctiva/sclera: Conjunctivae normal.     Pupils: Pupils are equal, round, and reactive to light.  Cardiovascular:     Rate and Rhythm: Normal rate and regular rhythm.     Pulses: Normal pulses.     Heart sounds: Normal heart sounds. No murmur heard.    No gallop.  Pulmonary:     Effort: Pulmonary effort is normal. No respiratory distress.     Breath sounds: Normal breath sounds. No stridor. No wheezing, rhonchi or rales.  Chest:     Chest wall: No tenderness.  Abdominal:     General: Abdomen is flat. Bowel sounds are normal. There is no distension.     Palpations: Abdomen is soft.     Tenderness: There is no abdominal tenderness. There is no  guarding.     Comments: No abdominal bruising  Musculoskeletal:        General: Normal range of motion.     Cervical back: Normal range of motion and neck supple. No rigidity or tenderness.     Comments: Tender to palpation noted over the right hip, pelvis stable to AP and lateral compression, nontender palpation of remainder bilateral extremities, DP and PT pulses are 2+ distally, sensation intact distally, full range of motion noted throughout, nontender palpation over bilateral upper extremities, radial pulse 2+ distally, sensation  intact distally, nontender palpation of the thoracic or lumbar spine, no step-off or deformity  Skin:    General: Skin is warm and dry.     Findings: No rash.  Neurological:     General: No focal deficit present.     Mental Status: He is alert and oriented to person, place, and time. Mental status is at baseline.     Cranial Nerves: No cranial nerve deficit.     Sensory: No sensory deficit.     Motor: No weakness.     Coordination: Coordination normal.  Psychiatric:        Mood and Affect: Mood normal.        Behavior: Behavior normal.        Thought Content: Thought content normal.        Judgment: Judgment normal.     (all labs ordered are listed, but only abnormal results are displayed) Labs Reviewed - No data to display  EKG: None  Radiology: No results found.   Procedures   Medications Ordered in the ED - No data to display  Clinical Course as of 04/06/24 1801  Sun Apr 06, 2024  1642 Patient made aware of the right sided parotid nodule.  Discussed the importance of close follow-up with primary care doctor for MRI for further evaluation.  Copy of CT scan report provided. [CR]  1750 Discussed patient case with Dr. Georgina with orthopedics who did recommend admission at [facility] and plan for the OR tomorrow. [CR]    Clinical Course User Index [CR] Daralene Lonni BIRCH, PA-C                                 Medical Decision  Making Amount and/or Complexity of Data Reviewed Labs: ordered. Radiology: ordered.  Risk Prescription drug management. Decision regarding hospitalization.   This patient presents to the ED for concern of fall, headache, right hip pain, this involves an extensive number of treatment options, and is a complaint that carries with it a high risk of complications and morbidity.  The differential diagnosis includes intracranial hemorrhage, closed head injury, concussion, vertebral fracture, hip fracture, contusion, sprain, strain   Co morbidities that complicate the patient evaluation  CAD, CHF, asthma, interstitial lung disease   Additional history obtained:  Additional history obtained from medical records External records from outside source obtained and reviewed including medical records   Lab Tests:  I Ordered, and personally interpreted labs.  The pertinent results include: Leukocytosis, anemia at baseline, thrombocytopenia at baseline, normal electrolytes, normal kidney function liver function, normal INR   Imaging Studies ordered:  I ordered imaging studies including CT scan head, CT cervical spine, x-ray of right hip, knee, chest x-ray I independently visualized and interpreted imaging which showed no acute intracranial hemorrhage, no cervical spine fracture, right parotid nodule, right intertrochanteric hip fracture, no acute osseous injury of the right knee, small bilateral pleural effusions, atelectasis I agree with the radiologist interpretation   Cardiac Monitoring: / EKG:  The patient was maintained on a cardiac monitor.  I personally viewed and interpreted the cardiac monitored which showed an underlying rhythm of: Sinus rhythm, no ST/T wave changes, no ischemic changes, no STEMI, right bundle branch block   Consultations Obtained:  I requested consultation with the orthopedics, Dr. Georgina,  and discussed lab and imaging findings as well as pertinent plan - they  recommend: Admission at [facility], OR tomorrow  Problem List / ED Course / Critical interventions / Medication management  Patient is doing well at this time and does remain stable.  Discussed with patient that he does have a right sided intertrochanteric hip fracture.  Did discuss this with Dr. Georgina with orthopedics who did recommend transfer to [facility] and plan for the OR tomorrow.  Blood work is otherwise been stable at this point.  Do suspect his leukocytosis is more likely secondary to demand from his injury.  He does have some small pleural effusions on chest x-ray.  Low suspicion for pneumonia at this point.  He has had no cold-like symptoms or productive cough.  He is afebrile in the emergency department as well.  Have discussed patient case with Dr. Barbarann with the hospitalist service who has excepted for admission. I ordered medication including morphine , Zofran , DuoNeb for acute traumatic pain, interstitial lung disease Reevaluation of the patient after these medicines showed that the patient improved I have reviewed the patients home medicines and have made adjustments as needed   Social Determinants of Health:  None   Test / Admission - Considered:  Admission     Final diagnoses:  None    ED Discharge Orders     None          Daralene Lonni JONETTA DEVONNA 04/06/24 1807    Cleotilde Rogue, MD 04/06/24 1843

## 2024-04-06 NOTE — Progress Notes (Signed)
 Orthopedic Note  Received consult from ER for right intertrochanteric femur fracture. Admit to medicine and transfer to [facility]. Will plan for operative fixation tomorrow evening (9/22). NPO at midnight (okay for sips with meds). NWB RLE. Ancef  and TXA on call to OR. Full consult to follow in AM once he gets to [facility].   Jeffrey DELENA Ada, MD Orthopedic Surgeon

## 2024-04-06 NOTE — Progress Notes (Signed)
 Orthopedic Tech Progress Note Patient Details:  Jeffrey Stewart 10/17/1934 [id number]  Patient ID: Jeffrey Stewart, male   DOB: 1935/02/26, 89 y.o.   MRN: [id number] Pt doesn't meet criteria for ohf. Pt must be under 70 to get ohf. Chandra Dorn PARAS 04/06/2024, 9:21 PM

## 2024-04-06 NOTE — Discharge Instructions (Addendum)
 Orthopedic Surgery Discharge Instructions  Patient name: Jeffrey Stewart Fracture: right intertrochanteric femur fracture Procedure Performed: right hip cephalomedullary nail Date of Surgery: 04/07/2024 Surgeon: Ozell Ada, MD  Activity: You are allowed to put as much weight on your leg as you would like. You can walk as much as you would like. You can perform household activities such as cleaning dishes, doing laundry, vacuuming, etc.  Incision Care: Your incision site has a dressing over it. That dressing should remain in place and dry at all times for a total of one week after surgery. After one week, you can remove the dressing. Underneath the dressing, you will find skin staples. You should leave these staples in place. They will be taken out in the office when the wound has healed. Do not pick, rub, or scrub at them. Do not put cream or lotion over the surgical area. After one week and once the dressing is off, it is okay to let soap and water run over your incision. Again, do not pick, scrub, or rub at the staples when bathing. Do not submerge (e.g., take a bath, swim, go in a hot tub, etc.) until six weeks after surgery. There may be some bloody drainage from the incision into the dressing after surgery. This is normal. You do not need to replace the dressing. Continue to leave it in place for the one week as instructed above. Should the dressing become saturated with blood or drainage, please call the office for further instructions.   Medications: You have been prescribed oxycodone . This is a narcotic pain medication and should only be taken as prescribed. You should not drink alcohol  or operate heavy machinery (including driving) while taking this medication. The oxycodone  can cause constipation as a side effect. For that reason, you have been prescribed senna and miralax . These are both laxatives. You do not need to take this medication if you develop diarrhea. Should you remain constipated  even while taking the senna and miralax , please use the miralax  twice daily. Tylenol  has been prescribed to be taken every 8 hours, which will give you additional pain relief.   You have been prescribed aspirin  as a blood thinner. This medication is to be taken to prevent blood clots. Take 81 milligrams twice daily. You should refrain from using other blood thinners (warfarin, apixaban, plavix, xarelto, etc.) while using the aspirin . You will need to take this medication for a total of 6 weeks after your surgery.   You should not use over-the-counter NSAIDs (ibuprofen, Aleve, Celebrex, naproxen, meloxicam, etc.) for pain relief because aspirin  is a similar medication. There can be side effects including but not limited to kidney injury and ulcers if you take these type of medications with the aspirin .  In order to set expectations for opioid prescriptions, you will only be prescribed opioids for a total of six weeks after surgery and, at two-weeks after surgery, your opioid prescription will start to tapered (decreased dosage and number of pills). If you have ongoing need for opioid medication six weeks after surgery, you will be referred to pain management. If you are already established with a provider that is giving you opioid medications, you should schedule an appointment with them for six weeks after surgery if you feel you are going to need another prescription. State law only allows for opioid prescriptions one week at a time. If you are running out of opioid medication near the end of the week, please call the office during business hours before  running out so I can send you another prescription.   Driving: You should not drive while taking narcotic pain medications. You should start getting back to driving slowly and you may want to try driving in a parking lot before doing anything more.   Diet: You are safe to resume your regular diet after surgery.   Reasons to Call the Office After Surgery:  You should feel free to call the office with any concerns or questions you have in the post-operative period, but you should definitely notify the office if you develop: -shortness of breath, chest pain, or trouble breathing -excessive bleeding, drainage, redness, or swelling around the surgical site -fevers, chills, or pain that is getting worse with each passing day -persistent nausea or vomiting -new weakness in the right lower extremity, new or worsening numbness or tingling in the right lower extremity -other concerns about your surgery  Follow Up Appointments: You have a follow up appointment scheduled with Jeffrey Stewart on 04/24/2024 at 9:15am. The office location and phone number are listed below. Please arrive on time to your appointment.   Office Information:  -Ozell Georgina, MD -Phone number: [phone] -Address: [address]       [place], [place] [address]

## 2024-04-06 NOTE — H&P (Signed)
 History and Physical    Patient: Jeffrey Stewart FMW:[id number] DOB: Mar 20, 1935 DOA: 04/06/2024 DOS: the patient was seen and examined on 04/06/2024 PCP: Anita Bernardino [name], FNP  Patient coming from: Home - lives with wife and jannis liberty; [place]: Wife, [phone]   Chief Complaint: Fall  HPI: Jeffrey Stewart is a 89 y.o. male with medical history significant of chronic HFrEF, CAD, and HLD who presented on 9/21 with a mechanical fall.  He reports that he got up this AM and decided to work in the garage.  He rarely goes outside, has ILD.  He was working on his golf cart, got out to move a garbage can and it rolled and he fell.  He felt good.  No recent chest pain.  Last MI was several years ago, maybe 5-6 years ago.  He was never a smoker, from RA or automobile asbestos exposure.  He does not normally wear O2, uses neb 2-6 times a day.  Last stress test was several years ago.   He does have neck discomfort chronically, he wonders if because he sleeps in a recliner due to his breathing.    ER Course:   Fall today, tripped.  R intertrochanteric fracture.  Dr. Georgina requests transfer to [facility] for repair tomorrow.  Incidental parotid nodule on imaging.     Review of Systems: As mentioned in the history of present illness. All other systems reviewed and are negative. Past Medical History:  Diagnosis Date   Allergy    Arthritis    MINOR ARTHRITIS FEET AND TOES   Asthma    Cataract    CHF (congestive heart failure) (HCC) 11/2009   EF45-50%   Coronary artery disease    Glaucoma (increased eye pressure) 04/06/2024   Hyperlipidemia    Myocardial infarction (HCC)    Pneumonia    HX OF PNEUMONIA 4 OR 5 YRS AGO   Rosacea    OF FACE   Shortness of breath    SOMETIMES SOB OR WHEEZING WITH EXERTION   Past Surgical History:  Procedure Laterality Date   APPENDECTOMY     AGE 55   CARDIAC CATHETERIZATION  11/2009   mild / mod cad,MODERATE TIGHT STENOSIS IN THE DISTAL LEFT CIRCUMFLEX ARTERY    ELECTROCARDIOGRAM  11/2009   RBBB   EYE SURGERY     BILATERAL CATARACT EXTRACT EXTRACTION WITH IMPLANTS   INGUINAL HERNIA REPAIR Bilateral 09/29/2013   Procedure: LAPAROSCOPIC BILATERAL  INGUINAL HERNIA;  Surgeon: Elon CHRISTELLA Pacini, MD;  Location: [facility];  Service: General;  Laterality: Bilateral;   INSERTION OF MESH Bilateral 09/29/2013   Procedure: INSERTION OF MESH;  Surgeon: Elon CHRISTELLA Pacini, MD;  Location: [facility];  Service: General;  Laterality: Bilateral;   last echo 02/2009     last nuc 2009  12/16/2007   EF 33%. ABNORMAL. HE HAS APICAL DEFECT CONSISTENT WITH A SCAR.LV FUNCTION MODERATELY  DEPRESSED   US  ECHOCARDIOGRAPHY  02/23/2009   EF 45-50%   US  ECHOCARDIOGRAPHY  12/27/2007   EF 45-50%   Social History:  reports that he has never smoked. He has never used smokeless tobacco. He reports that he does not currently use alcohol . He reports that he does not use drugs.  Allergies  Allergen Reactions   Niaspan  [Niacin ] Other (See Comments)    Intolerance flush feeling when on large dose - is able to take smaller dosage without problem    Meloxicam Other (See Comments)    Disorientation     Family History  Problem Relation Age of Onset   Diabetes Sister    Lung cancer Sister        smoker    Prior to Admission medications   Medication Sig Start Date End Date Taking? Authorizing Provider  acetaminophen  (TYLENOL ) 500 MG tablet Take 1,000 mg by mouth every 6 (six) hours as needed for moderate pain.    [provider]  albuterol  (VENTOLIN  HFA) 108 (90 Base) MCG/ACT inhaler Inhale 2 puffs into the lungs every 4 (four) hours as needed for wheezing or shortness of breath. 06/19/22   Bernard Drivers, MD  aspirin  EC 81 MG tablet Take 81 mg by mouth every evening.    Pr-ovider, Historical, MD  atorvastatin  (LIPITOR) 40 MG tablet TAKE 1 TABLET AT BEDTIME 08/01/23   Nahser, Aleene PARAS, MD  brimonidine  (ALPHAGAN ) 0.2 % ophthalmic solution Place 1 drop into both eyes 2 (two) times daily. 07/20/21    [provider]  carvedilol  (COREG ) 6.25 MG tablet Take 1 tablet (6.25 mg total) by mouth 2 (two) times daily with a meal. 04/02/24   Patwardhan, Manish J, MD  cetirizine (ZYRTEC) 10 MG tablet Take 10 mg by mouth at bedtime.    [provider]  Cholecalciferol  (VITAMIN D3) 1000 units CAPS Take 1,000 Units by mouth daily.    [provider]  fish oil-omega-3 fatty acids  1000 MG capsule Take 1 g by mouth 2 (two) times daily.    [provider]  fluticasone  (FLONASE ) 50 MCG/ACT nasal spray Place 1-2 sprays into both nostrils daily.    [provider]  Fluticasone -Salmeterol (ADVAIR) 250-50 MCG/DOSE AEPB Inhale 1 puff into the lungs 2 (two) times daily. 05/12/19   [provider]  fluticasone -salmeterol (WIXELA INHUB) 250-50 MCG/ACT AEPB Inhale 1 puff into the lungs in the morning and at bedtime.    [provider]  glucosamine-chondroitin 500-400 MG tablet Take 1 tablet by mouth at bedtime.    [provider]  ipratropium-albuterol  (DUONEB) 0.5-2.5 (3) MG/3ML SOLN Take 3 mLs by nebulization every 6 (six) hours as needed. 07/11/21   [provider]  latanoprost  (XALATAN ) 0.005 % ophthalmic solution Place 1 drop into both eyes at bedtime. 06/27/21   [provider]  metroNIDAZOLE  (METROCREAM ) 0.75 % cream Apply 1 application  topically 2 (two) times daily as needed (for rosacea).    [provider]  minocycline  (DYNACIN ) 50 MG tablet Take 50 mg by mouth 2 (two) times daily.    [provider]  mycophenolate  (CELLCEPT ) 500 MG tablet TAKE 1 TABLET BY MOUTH TWICE A DAY Patient taking differently: Take 1,000 mg by mouth 2 (two) times daily. 09/17/23   Mannam, Praveen, MD  NEOMYCIN -POLYMYXIN-HYDROCORTISONE  (CORTISPORIN) 1 % SOLN OTIC solution Place 1 drop into both ears as needed (when ears are stopped up). 05/13/19   [provider]  nitroGLYCERIN  (NITROSTAT ) 0.4 MG SL tablet Place 1 tablet  (0.4 mg total) under the tongue every 5 (five) minutes as needed for chest pain. 02/14/22   Nahser, Aleene PARAS, MD  Polyethyl Glycol-Propyl Glycol 0.4-0.3 % SOLN Place 1 drop into both eyes in the morning and at bedtime.    [provider]  potassium chloride  SA (KLOR-CON  M) 20 MEQ tablet Take 1 tablet (20 mEq total) by mouth daily. 03/18/24   Campbell, Kenzie E, NP  predniSONE  (DELTASONE ) 1 MG tablet Take 1 mg by mouth at bedtime. 05/19/23   [provider]  predniSONE  (DELTASONE ) 5 MG tablet Take 5 mg by mouth daily with  breakfast.    [provider]  sodium chloride  (OCEAN) 0.65 % nasal spray Place 1 spray into the nose as needed for congestion.    [provider]  torsemide  (DEMADEX ) 20 MG tablet Take 1 tablet (20 mg total) by mouth daily. 01/31/24   Nahser, Aleene PARAS, MD    Physical Exam: Vitals:   04/06/24 1700 04/06/24 1726 04/06/24 1737 04/06/24 1842  BP: 125/63 (!) 122/53    Pulse: 95 91 92   Resp: (!) 32 16 19   Temp:    98.1 F (36.7 C)  TempSrc:    Oral  SpO2: (!) 89% 100% 95%   Weight:      Height:       General:  Appears calm and comfortable and is in NAD; hip pain with movement Eyes:  EOMI, normal lids, iris ENT: hard of hearing,  grossly normal lips & tongue, mmm Neck:  no LAD, masses or thyromegaly, parotid mass not appreciated Cardiovascular:  RRR. 1+ LE edema despite compression stockings.  Respiratory:   CTA bilaterally with no wheezes/rales/rhonchi.  Normal respiratory effort.   Abdomen:  soft, NT, ND Skin:  no rash or induration seen on limited exam Musculoskeletal:  R leg is shortened and externally rotated Psychiatric:  grossly normal mood and affect, speech fluent and appropriate, A&O x 3 Neurologic:  CN 2-12 grossly intact, moves all extremities in coordinated fashion other than RLE    Radiological Exams on Admission: Independently reviewed - see discussion in A/P where applicable  DG Chest Port 1 View Result Date:  04/06/2024 CLINICAL DATA:  Pre-surgical. EXAM: RIGHT KNEE - 1-2 VIEW; PORTABLE CHEST - 1 VIEW COMPARISON:  09/08/2023, 04/06/2024. FINDINGS: Right knee: No acute fracture or dislocation is seen. There is a bony exostosis along the lateral aspect of the mid femur mild to moderate tricompartmental degenerative changes are noted at the knee. There is a trace joint effusion. Vascular calcifications are present in the soft tissues. Chest: The heart is enlarged and the mediastinal contour is within normal limits for supine positioning. Atherosclerotic calcification of the aorta is noted. Lung volumes are low with atelectasis or infiltrate at the lung bases. There small bilateral pleural effusions. No pneumothorax is seen. No acute osseous abnormality. IMPRESSION: 1. No acute fracture or dislocation at the right knee. 2. Small bilateral pleural effusions with atelectasis or infiltrate at the lung bases. 3. Cardiomegaly. Electronically Signed   By: Leita Birmingham M.D.   On: 04/06/2024 17:29   DG Knee 1-2 Views Right Result Date: 04/06/2024 CLINICAL DATA:  Pre-surgical. EXAM: RIGHT KNEE - 1-2 VIEW; PORTABLE CHEST - 1 VIEW COMPARISON:  09/08/2023, 04/06/2024. FINDINGS: Right knee: No acute fracture or dislocation is seen. There is a bony exostosis along the lateral aspect of the mid femur mild to moderate tricompartmental degenerative changes are noted at the knee. There is a trace joint effusion. Vascular calcifications are present in the soft tissues. Chest: The heart is enlarged and the mediastinal contour is within normal limits for supine positioning. Atherosclerotic calcification of the aorta is noted. Lung volumes are low with atelectasis or infiltrate at the lung bases. There small bilateral pleural effusions. No pneumothorax is seen. No acute osseous abnormality. IMPRESSION: 1. No acute fracture or dislocation at the right knee. 2. Small bilateral pleural effusions with atelectasis or infiltrate at the lung bases.  3. Cardiomegaly. Electronically Signed   By: Leita Birmingham M.D.   On: 04/06/2024 17:29   DG Hip Unilat W or Wo Pelvis  2-3 Views Right Result Date: 04/06/2024 CLINICAL DATA:  Fall with diffuse pain. EXAM: DG HIP (WITH OR WITHOUT PELVIS) 2-3V RIGHT COMPARISON:  None Available. FINDINGS: There is diffusely decreased mineralization of the bones. There is a nondisplaced intratrochanteric fracture of the proximal right femur. The remaining bony structures are intact and there is no dislocation. Degenerative changes are present at the bilateral hips, sacroiliac joints and lower lumbar spine. Vascular calcifications are seen in the soft tissues. IMPRESSION: Minimally displaced intertrochanteric fracture of the proximal right femur. Electronically Signed   By: Leita Birmingham M.D.   On: 04/06/2024 16:22   CT Head Wo Contrast Result Date: 04/06/2024 CLINICAL DATA:  Fall with head injury. EXAM: CT HEAD WITHOUT CONTRAST CT CERVICAL SPINE WITHOUT CONTRAST TECHNIQUE: Multidetector CT imaging of the head and cervical spine was performed following the standard protocol without intravenous contrast. Multiplanar CT image reconstructions of the cervical spine were also generated. RADIATION DOSE REDUCTION: This exam was performed according to the departmental dose-optimization program which includes automated exposure control, adjustment of the mA and/or kV according to patient size and/or use of iterative reconstruction technique. COMPARISON:  CT cervical spine 07/21/2017. FINDINGS: CT HEAD FINDINGS Brain: Ventricles, cisterns and other CSF spaces are normal. Minimal chronic ischemic microvascular disease. No mass, mass effect, shift of midline structures or acute hemorrhage. No evidence of acute infarction. Vascular: No hyperdense vessel or unexpected calcification. Skull: Normal. Negative for fracture or focal lesion. Sinuses/Orbits: Orbits are normal. Minimal opacification over the right sphenoid sinus compatible mild chronic  inflammatory change. Other: None. CT CERVICAL SPINE FINDINGS Alignment: Subtle anterior stairstep subluxations over the mid to lower cervical spine unchanged. No posttraumatic subluxation. Skull base and vertebrae: Vertebral body heights are normal. There is mild to moderate spondylosis throughout the cervical spine to include uncovertebral joint spurring and facet arthropathy. Mild to moderate left-sided neural foraminal narrowing at the C2-3 level. Mild bilateral neural foraminal narrowing at the C3-4 level and moderate right-sided neural foraminal narrowing at the C4-5 level. Minimal left-sided neural foraminal narrowing at the C5-6 level. No acute fracture. Prevertebral soft tissues are normal. Soft tissues and spinal canal: No spinal canal abnormality. Prevertebral soft tissues normal. Disc levels: Disc space narrowing at the C5-6 and C6-7 levels. Also disc space narrowing at the C7-T1 level. Upper chest: No acute findings. Other: Oval mass over the posterior aspect of the right parotid gland measuring 1.2 cm in short axis with significant enlargement compared to the prior exam. IMPRESSION: 1. No acute brain injury. 2. Minimal chronic ischemic microvascular disease. 3. No acute cervical spine injury. 4. Mild to moderate spondylosis throughout the cervical spine with disc disease at the C5-6, C6-7 and C7-T1 levels. Multilevel neural foraminal narrowing as described. 5. 1.2 cm oval mass over the posterior aspect of the right parotid gland with significant enlargement compared to the prior exam. This may represent an enlarged lymph node versus a primary parotid neoplasm. Consider MRI with without contrast for further evaluation. Electronically Signed   By: Toribio Agreste M.D.   On: 04/06/2024 16:15   CT Cervical Spine Wo Contrast Result Date: 04/06/2024 CLINICAL DATA:  Fall with head injury. EXAM: CT HEAD WITHOUT CONTRAST CT CERVICAL SPINE WITHOUT CONTRAST TECHNIQUE: Multidetector CT imaging of the head and  cervical spine was performed following the standard protocol without intravenous contrast. Multiplanar CT image reconstructions of the cervical spine were also generated. RADIATION DOSE REDUCTION: This exam was performed according to the departmental dose-optimization program which includes automated exposure control, adjustment  of the mA and/or kV according to patient size and/or use of iterative reconstruction technique. COMPARISON:  CT cervical spine 07/21/2017. FINDINGS: CT HEAD FINDINGS Brain: Ventricles, cisterns and other CSF spaces are normal. Minimal chronic ischemic microvascular disease. No mass, mass effect, shift of midline structures or acute hemorrhage. No evidence of acute infarction. Vascular: No hyperdense vessel or unexpected calcification. Skull: Normal. Negative for fracture or focal lesion. Sinuses/Orbits: Orbits are normal. Minimal opacification over the right sphenoid sinus compatible mild chronic inflammatory change. Other: None. CT CERVICAL SPINE FINDINGS Alignment: Subtle anterior stairstep subluxations over the mid to lower cervical spine unchanged. No posttraumatic subluxation. Skull base and vertebrae: Vertebral body heights are normal. There is mild to moderate spondylosis throughout the cervical spine to include uncovertebral joint spurring and facet arthropathy. Mild to moderate left-sided neural foraminal narrowing at the C2-3 level. Mild bilateral neural foraminal narrowing at the C3-4 level and moderate right-sided neural foraminal narrowing at the C4-5 level. Minimal left-sided neural foraminal narrowing at the C5-6 level. No acute fracture. Prevertebral soft tissues are normal. Soft tissues and spinal canal: No spinal canal abnormality. Prevertebral soft tissues normal. Disc levels: Disc space narrowing at the C5-6 and C6-7 levels. Also disc space narrowing at the C7-T1 level. Upper chest: No acute findings. Other: Oval mass over the posterior aspect of the right parotid gland  measuring 1.2 cm in short axis with significant enlargement compared to the prior exam. IMPRESSION: 1. No acute brain injury. 2. Minimal chronic ischemic microvascular disease. 3. No acute cervical spine injury. 4. Mild to moderate spondylosis throughout the cervical spine with disc disease at the C5-6, C6-7 and C7-T1 levels. Multilevel neural foraminal narrowing as described. 5. 1.2 cm oval mass over the posterior aspect of the right parotid gland with significant enlargement compared to the prior exam. This may represent an enlarged lymph node versus a primary parotid neoplasm. Consider MRI with without contrast for further evaluation. Electronically Signed   By: Toribio Agreste M.D.   On: 04/06/2024 16:15    EKG: Independently reviewed.  NSR with rate 98; RBBB; nonspecific ST changes with no evidence of acute ischemia, NSCSLT   Labs on Admission: I have personally reviewed the available labs and imaging studies at the time of the admission.  Pertinent labs:     Glucose 110 BUN 28/Creatinine 1.03/GFR >60, stable Albumin  3.3 Bili 1.3, stable WBC 16.2 Hgb 12.9 Platelets 113   Assessment and Plan: Principal Problem:   Hip fracture (HCC) Active Problems:   CAD (coronary artery disease)   Hyperlipidemia   Chronic combined systolic (congestive) and diastolic (congestive) heart failure (HCC)   ILD (interstitial lung disease) (HCC)   Glaucoma (increased eye pressure)   Parotid mass   Neck pain    Hip fracture Apparently mechanical fall resulting in hip fracture Orthopedics consulted NPO after midnight in anticipation of surgical repair tomorrow at [facility] SCDs overnight, start Lovenox  post-operatively (or as per ortho) Pain control with Tylenol , Robaxin , Oxycodone , and Morphine  prn TOC team consult for rehab placement, although patient strongly prefers home if possible Will need PT consult post-operatively Hip fracture order set utilized TXA per orthopedics Fascia iliacus block ordered  per anesthesia  Pre-operative stratification Orthopedic/spinal surgery is associated with an intermediate (1-5%) cardiovascular risk for cardiac death and nonfatal MI With his h/o CHF and CAD, his revised cardiac index gives a risk estimate of 5% Because of this risk, he is recommended to have pre-operative EKG testing prior to surgery; this was done in the  ER His Detsky's Modified Cardiac Risk Index score is Class I, with a low cardiac risk Given his moderate functional capacity, it is reasonable for him to go to the OR without additional evaluation  Parotid gland mass Incidental finding on imaging today Consider further imaging with MRI, recommend in the next few weeks post-hospitalization Will need outpatient ENT follow up  Neck pain Chronic CT with degenerative changes Also sleeps upright in a recliner Also with parotid mass Recommend outpatient f/u  Chronic combined CHF Appears compensated Most recent echo was in 2023 with low normal EF (50%), grade 1 DD Continue torsemide /KCl  CAD Continue ASA 81 mg daily  HLD Continue atorvastatin  Good lipid control at most recent check Hold fish oil while hospitalized  HTN Continue carvedilol   ILD Not on home O2 Continue prednisone ; there is not an apparent need for stress dosed steroids at this time Continue mycophenolate  Continue Albuterol , Claritin  (in lieu of Zyrtec), Flonase , Breo (in lieu of Advair), Duonebs  Glaucoma Continue brimonidine , latanoprost       Advance Care Planning:   Code Status: Full Code - Code status was discussed with the patient and/or family at the time of admission.  The patient would want to receive full resuscitative measures at this time.   Consults: Nutrition, TOC team; will need PT post-operatively  DVT Prophylaxis: SCDs until approved for Lovenox  by orthopedics  Family Communication: Wife and daughter were present throughout  Severity of Illness: The appropriate patient status for  this patient is INPATIENT. Inpatient status is judged to be reasonable and necessary in order to provide the required intensity of service to ensure the patient's safety. The patient's presenting symptoms, physical exam findings, and initial radiographic and laboratory data in the context of their chronic comorbidities is felt to place them at high risk for further clinical deterioration. Furthermore, it is not anticipated that the patient will be medically stable for discharge from the hospital within 2 midnights of admission.   * I certify that at the point of admission it is my clinical judgment that the patient will require inpatient hospital care spanning beyond 2 midnights from the point of admission due to high intensity of service, high risk for further deterioration and high frequency of surveillance required.*  Author: Delon Herald, MD 04/06/2024 7:09 PM  For on call review [email or web address].

## 2024-04-07 ENCOUNTER — Inpatient Hospital Stay (HOSPITAL_COMMUNITY)

## 2024-04-07 ENCOUNTER — Other Ambulatory Visit: Payer: Self-pay

## 2024-04-07 ENCOUNTER — Encounter (HOSPITAL_COMMUNITY): Payer: Self-pay | Admitting: Internal Medicine

## 2024-04-07 ENCOUNTER — Inpatient Hospital Stay (HOSPITAL_COMMUNITY): Admitting: Anesthesiology

## 2024-04-07 ENCOUNTER — Encounter (HOSPITAL_COMMUNITY)
Admission: EM | Disposition: A | Discharge status: Home Health | Payer: Self-pay | Source: Home / Self Care | Attending: Internal Medicine

## 2024-04-07 DIAGNOSIS — E440 Moderate protein-calorie malnutrition: Secondary | ICD-10-CM | POA: Insufficient documentation

## 2024-04-07 DIAGNOSIS — S72141A Displaced intertrochanteric fracture of right femur, initial encounter for closed fracture: Secondary | ICD-10-CM

## 2024-04-07 DIAGNOSIS — S72001A Fracture of unspecified part of neck of right femur, initial encounter for closed fracture: Secondary | ICD-10-CM | POA: Diagnosis not present

## 2024-04-07 DIAGNOSIS — E782 Mixed hyperlipidemia: Secondary | ICD-10-CM

## 2024-04-07 DIAGNOSIS — E785 Hyperlipidemia, unspecified: Secondary | ICD-10-CM | POA: Diagnosis not present

## 2024-04-07 DIAGNOSIS — I2510 Atherosclerotic heart disease of native coronary artery without angina pectoris: Secondary | ICD-10-CM | POA: Diagnosis not present

## 2024-04-07 DIAGNOSIS — I5042 Chronic combined systolic (congestive) and diastolic (congestive) heart failure: Secondary | ICD-10-CM | POA: Diagnosis not present

## 2024-04-07 HISTORY — PX: INTRAMEDULLARY (IM) NAIL INTERTROCHANTERIC: SHX5875

## 2024-04-07 LAB — SURGICAL PCR SCREEN
MRSA, PCR: POSITIVE — AB
Staphylococcus aureus: POSITIVE — AB

## 2024-04-07 SURGERY — FIXATION, FRACTURE, INTERTROCHANTERIC, WITH INTRAMEDULLARY ROD
Anesthesia: General | Laterality: Right

## 2024-04-07 MED ORDER — ACETAMINOPHEN 500 MG PO TABS
1000.0000 mg | ORAL_TABLET | Freq: Three times a day (TID) | ORAL | Status: DC
  Administered 2024-04-07 – 2024-04-12 (×15): 1000 mg via ORAL
  Filled 2024-04-07 (×15): qty 2

## 2024-04-07 MED ORDER — VANCOMYCIN HCL 1000 MG IV SOLR
INTRAVENOUS | Status: AC
  Filled 2024-04-07: qty 20

## 2024-04-07 MED ORDER — ALBUMIN HUMAN 5 % IV SOLN
12.5000 g | Freq: Once | INTRAVENOUS | Status: AC
  Administered 2024-04-07: 12.5 g via INTRAVENOUS

## 2024-04-07 MED ORDER — PHENYLEPHRINE 80 MCG/ML (10ML) SYRINGE FOR IV PUSH (FOR BLOOD PRESSURE SUPPORT)
PREFILLED_SYRINGE | INTRAVENOUS | Status: DC | PRN
  Administered 2024-04-07: 160 ug via INTRAVENOUS
  Administered 2024-04-07: 80 ug via INTRAVENOUS
  Administered 2024-04-07 (×4): 160 ug via INTRAVENOUS

## 2024-04-07 MED ORDER — PROPOFOL 500 MG/50ML IV EMUL
INTRAVENOUS | Status: DC | PRN
  Administered 2024-04-07: 75 ug/kg/min via INTRAVENOUS

## 2024-04-07 MED ORDER — CEFAZOLIN SODIUM-DEXTROSE 2-4 GM/100ML-% IV SOLN
2.0000 g | Freq: Three times a day (TID) | INTRAVENOUS | Status: AC
  Administered 2024-04-07 – 2024-04-08 (×3): 2 g via INTRAVENOUS
  Filled 2024-04-07 (×4): qty 100

## 2024-04-07 MED ORDER — GLYCOPYRROLATE PF 0.2 MG/ML IJ SOSY
PREFILLED_SYRINGE | INTRAMUSCULAR | Status: DC | PRN
  Administered 2024-04-07: .1 mg via INTRAVENOUS

## 2024-04-07 MED ORDER — SENNA 8.6 MG PO TABS
1.0000 | ORAL_TABLET | Freq: Two times a day (BID) | ORAL | Status: DC
  Administered 2024-04-07 – 2024-04-12 (×9): 8.6 mg via ORAL
  Filled 2024-04-07 (×9): qty 1

## 2024-04-07 MED ORDER — PHENYLEPHRINE HCL-NACL 20-0.9 MG/250ML-% IV SOLN
INTRAVENOUS | Status: DC | PRN
  Administered 2024-04-07: 25 ug/min via INTRAVENOUS

## 2024-04-07 MED ORDER — VANCOMYCIN HCL 1000 MG IV SOLR
INTRAVENOUS | Status: DC | PRN
  Administered 2024-04-07: 1000 mg via TOPICAL

## 2024-04-07 MED ORDER — CHLORHEXIDINE GLUCONATE 0.12 % MT SOLN: 15.0000 mL | Freq: Once | OROMUCOSAL | Status: AC

## 2024-04-07 MED ORDER — LIDOCAINE 2% (20 MG/ML) 5 ML SYRINGE
INTRAMUSCULAR | Status: DC | PRN
Start: 2024-04-07 — End: 2024-04-07
  Administered 2024-04-07: 80 mg via INTRAVENOUS

## 2024-04-07 MED ORDER — ASPIRIN 81 MG PO TBEC
81.0000 mg | DELAYED_RELEASE_TABLET | Freq: Two times a day (BID) | ORAL | Status: DC
  Administered 2024-04-07 – 2024-04-12 (×10): 81 mg via ORAL
  Filled 2024-04-07 (×10): qty 1

## 2024-04-07 MED ORDER — POLYETHYLENE GLYCOL 3350 17 G PO PACK
17.0000 g | PACK | Freq: Every day | ORAL | Status: DC
  Administered 2024-04-08 – 2024-04-11 (×4): 17 g via ORAL
  Filled 2024-04-07 (×5): qty 1

## 2024-04-07 MED ORDER — 0.9 % SODIUM CHLORIDE (POUR BTL) OPTIME
TOPICAL | Status: DC | PRN
  Administered 2024-04-07: 1000 mL

## 2024-04-07 MED ORDER — DEXAMETHASONE SODIUM PHOSPHATE 4 MG/ML IJ SOLN
INTRAMUSCULAR | Status: DC | PRN
  Administered 2024-04-07: 10 mg via INTRAVENOUS

## 2024-04-07 MED ORDER — FENTANYL CITRATE (PF) 100 MCG/2ML IJ SOLN: 25.0000 ug | INTRAMUSCULAR | Status: DC | PRN

## 2024-04-07 MED ORDER — FENTANYL CITRATE (PF) 250 MCG/5ML IJ SOLN
INTRAMUSCULAR | Status: AC
  Filled 2024-04-07: qty 5

## 2024-04-07 MED ORDER — ONDANSETRON HCL 4 MG/2ML IJ SOLN
INTRAMUSCULAR | Status: DC | PRN
  Administered 2024-04-07: 4 mg via INTRAVENOUS

## 2024-04-07 MED ORDER — CHLORHEXIDINE GLUCONATE CLOTH 2 % EX PADS
6.0000 | MEDICATED_PAD | Freq: Every day | CUTANEOUS | Status: AC
Start: 2024-04-07 — End: 2024-04-12
  Administered 2024-04-08 – 2024-04-11 (×2): 6 via TOPICAL

## 2024-04-07 MED ORDER — EPHEDRINE SULFATE-NACL 50-0.9 MG/10ML-% IV SOSY
PREFILLED_SYRINGE | INTRAVENOUS | Status: DC | PRN
  Administered 2024-04-07: 5 mg via INTRAVENOUS

## 2024-04-07 MED ORDER — CEFAZOLIN SODIUM-DEXTROSE 2-3 GM-%(50ML) IV SOLR
INTRAVENOUS | Status: DC | PRN
  Administered 2024-04-07: 2 g via INTRAVENOUS

## 2024-04-07 MED ORDER — GLUCERNA SHAKE PO LIQD
237.0000 mL | Freq: Two times a day (BID) | ORAL | Status: DC
  Administered 2024-04-08 – 2024-04-11 (×7): 237 mL via ORAL

## 2024-04-07 MED ORDER — OXYCODONE HCL 5 MG PO TABS
5.0000 mg | ORAL_TABLET | ORAL | Status: DC | PRN
  Administered 2024-04-08 (×2): 10 mg via ORAL
  Administered 2024-04-09: 5 mg via ORAL
  Administered 2024-04-09: 10 mg via ORAL
  Administered 2024-04-09: 5 mg via ORAL
  Administered 2024-04-10: 10 mg via ORAL
  Administered 2024-04-11 – 2024-04-12 (×3): 5 mg via ORAL
  Filled 2024-04-07: qty 1
  Filled 2024-04-07 (×2): qty 2
  Filled 2024-04-07 (×3): qty 1
  Filled 2024-04-07 (×2): qty 2
  Filled 2024-04-07: qty 1

## 2024-04-07 MED ORDER — CHLORHEXIDINE GLUCONATE 0.12 % MT SOLN
OROMUCOSAL | Status: AC
  Administered 2024-04-07: 15 mL via OROMUCOSAL
  Filled 2024-04-07: qty 15

## 2024-04-07 MED ORDER — MORPHINE SULFATE (PF) 2 MG/ML IV SOLN: 2.0000 mg | INTRAVENOUS | Status: AC | PRN

## 2024-04-07 MED ORDER — TRANEXAMIC ACID-NACL 1000-0.7 MG/100ML-% IV SOLN: 1000.0000 mg | Freq: Once | INTRAVENOUS | Status: DC

## 2024-04-07 MED ORDER — ROCURONIUM BROMIDE 10 MG/ML (PF) SYRINGE
PREFILLED_SYRINGE | INTRAVENOUS | Status: DC | PRN
Start: 2024-04-07 — End: 2024-04-07
  Administered 2024-04-07: 50 mg via INTRAVENOUS

## 2024-04-07 MED ORDER — LACTATED RINGERS IV SOLN
INTRAVENOUS | Status: DC
Start: 2024-04-07 — End: 2024-04-07

## 2024-04-07 MED ORDER — KETAMINE HCL 50 MG/5ML IJ SOSY
PREFILLED_SYRINGE | INTRAMUSCULAR | Status: AC
  Filled 2024-04-07: qty 5

## 2024-04-07 MED ORDER — ALBUMIN HUMAN 5 % IV SOLN
INTRAVENOUS | Status: AC
Start: 2024-04-07 — End: 2024-04-07
  Filled 2024-04-07: qty 250

## 2024-04-07 MED ORDER — ORAL CARE MOUTH RINSE: 15.0000 mL | Freq: Once | OROMUCOSAL | Status: AC

## 2024-04-07 MED ORDER — SUGAMMADEX SODIUM 200 MG/2ML IV SOLN
INTRAVENOUS | Status: DC | PRN
  Administered 2024-04-07: 200 mg via INTRAVENOUS

## 2024-04-07 MED ORDER — ACETAMINOPHEN 10 MG/ML IV SOLN
INTRAVENOUS | Status: AC
  Filled 2024-04-07: qty 100

## 2024-04-07 MED ORDER — PROPOFOL 10 MG/ML IV BOLUS
INTRAVENOUS | Status: DC | PRN
  Administered 2024-04-07: 80 mg via INTRAVENOUS

## 2024-04-07 MED ORDER — FENTANYL CITRATE (PF) 250 MCG/5ML IJ SOLN
INTRAMUSCULAR | Status: DC | PRN
  Administered 2024-04-07: 100 ug via INTRAVENOUS

## 2024-04-07 MED ORDER — MUPIROCIN 2 % EX OINT
1.0000 | TOPICAL_OINTMENT | Freq: Two times a day (BID) | CUTANEOUS | Status: AC
Start: 2024-04-07 — End: 2024-04-12
  Administered 2024-04-07 – 2024-04-12 (×10): 1 via NASAL
  Filled 2024-04-07: qty 22

## 2024-04-07 MED ORDER — ALBUMIN HUMAN 5 % IV SOLN: INTRAVENOUS | Status: DC | PRN

## 2024-04-07 SURGICAL SUPPLY — 30 items
BIT DRILL INTERTAN LAG SCREW (BIT) IMPLANT
BIT DRILL LONG 4.0 (BIT) IMPLANT
BNDG COHESIVE 6X5 TAN NS LF (GAUZE/BANDAGES/DRESSINGS) ×1 IMPLANT
COVER PERINEAL POST (MISCELLANEOUS) ×1 IMPLANT
COVER SURGICAL LIGHT HANDLE (MISCELLANEOUS) ×1 IMPLANT
DRAPE C-ARM 42X72 X-RAY (DRAPES) ×1 IMPLANT
DRAPE C-ARMOR (DRAPES) ×1 IMPLANT
DRAPE STERI IOBAN 125X83 (DRAPES) ×1 IMPLANT
DRSG TEGADERM 4X4.75 (GAUZE/BANDAGES/DRESSINGS) ×5 IMPLANT
DRSG XEROFORM 1X8 (GAUZE/BANDAGES/DRESSINGS) IMPLANT
DURAPREP 26ML APPLICATOR (WOUND CARE) ×1 IMPLANT
GAUZE SPONGE 4X4 12PLY STRL (GAUZE/BANDAGES/DRESSINGS) ×1 IMPLANT
GAUZE XEROFORM 1X8 LF (GAUZE/BANDAGES/DRESSINGS) ×1 IMPLANT
GLOVE INDICATOR 7.5 STRL GRN (GLOVE) ×1 IMPLANT
GLOVE SS BIOGEL STRL SZ 7.5 (GLOVE) ×1 IMPLANT
GOWN STRL SURGICAL XL XLNG (GOWN DISPOSABLE) ×1 IMPLANT
GUIDEROD BALL TIP 3.0X800 (ORTHOPEDIC DISPOSABLE SUPPLIES) IMPLANT
KIT BASIN OR (CUSTOM PROCEDURE TRAY) ×1 IMPLANT
KIT TURNOVER KIT B (KITS) ×1 IMPLANT
NAIL TRIGEN INTERTAN 10X18CM (Nail) IMPLANT
NS IRRIG 1000ML POUR BTL (IV SOLUTION) ×1 IMPLANT
PACK GENERAL/GYN (CUSTOM PROCEDURE TRAY) ×1 IMPLANT
PAD ARMBOARD POSITIONER FOAM (MISCELLANEOUS) ×1 IMPLANT
PIN GUIDE 3.2X343MM (PIN) IMPLANT
SCREW LAG COMPR KIT 110/105 (Screw) IMPLANT
SCREW TRIGEN LOW PROF 5.0X32.5 (Screw) IMPLANT
STAPLER VISISTAT (STAPLE) ×1 IMPLANT
SUT VIC AB 0 CT1 18XCR BRD8 (SUTURE) ×1 IMPLANT
SUT VIC AB 2-0 CT1 18 (SUTURE) ×1 IMPLANT
WATER STERILE IRR 1000ML POUR (IV SOLUTION) ×1 IMPLANT

## 2024-04-07 NOTE — Progress Notes (Signed)
 Initial Nutrition Assessment  DOCUMENTATION CODES:   Non-severe (moderate) malnutrition in context of social or environmental circumstances (aging, decreased appetite)  INTERVENTION:  Once pt out of surgery and diet ordered, recommend: Liberalized regular diet to provide increased options and promote adequate intake of calories and protein Ensure Plus High Protein po BID, each supplement provides 350 kcal and 20 grams of protein. Magic cup TID with meals, each supplement provides 290 kcal and 9 grams of protein  Encouraged adequate intake of meals and supplements to meet increased calorie and protein needs for post op recovery   NUTRITION DIAGNOSIS:   Moderate Malnutrition related to social / environmental circumstances (aging, decreased appetite) as evidenced by mild fat depletion, mild muscle depletion, moderate muscle depletion.  GOAL:   Patient will meet greater than or equal to 90% of their needs  MONITOR:   PO intake, Supplement acceptance, Diet advancement  REASON FOR ASSESSMENT:   Consult Assessment of nutrition requirement/status, Hip fracture protocol  ASSESSMENT:   Pt with hx heart failure, CAD, arthritis and HLD. Admitted after fall, diagnosed R intertrochanteric fx.  Plan for surgery today, pt currently NPO. Spoke with pt who was resting in bed. Pt reports pain has been well-managed. Pt reports he is slightly hungry and is not looking forward to having to wait all day for impending surgery. Discussed importance of adequate nutrition following surgery and encouraged pt to drink ONS if appetite is poor tonight after surgery.  PTA, pt reports good appetite. Pt reports he eats 3x per day but has definitely decreased his portions over the years. Breakfast: sausage biscuit or sausage and eggs Lunch: sandwich (sometimes with deli meat, sometimes without) Dinner: meat, potatoes/rice, and vegetable Pt reports wife cooks most meals. Pt reports no GI discomforts PTA and was  able to be independent with his mobility. Pt reports no recent wt loss and states his wt has remained stable. Pt reports he monitors any fluid retention since he has heart failure and knows the signs of when he is holding onto fluid. Nutrition focused physical exam shows some mild to moderate fat depletions and mild to moderate muscle depletions, indicative of malnutrition. Suspect malnutrition related to overall decreased intake in the context of aging.   Encouraged adequate intake of calories and protein to aid in post op recovery. Discussed ONS like Ensure to supplement calories and protein, pt agreeable.  Medications reviewed and include:  Colace Potassium chloride  20 mEq Cefazolin  Cyklokapron    Labs reviewed   NUTRITION - FOCUSED PHYSICAL EXAM:  Flowsheet Row Most Recent Value  Orbital Region Mild depletion  Upper Arm Region Mild depletion  Thoracic and Lumbar Region No depletion  Buccal Region No depletion  Temple Region Moderate depletion  Clavicle Bone Region Moderate depletion  Clavicle and Acromion Bone Region Moderate depletion  Scapular Bone Region Moderate depletion  Dorsal Hand Mild depletion  Patellar Region Mild depletion  Anterior Thigh Region Mild depletion  Posterior Calf Region Mild depletion  Edema (RD Assessment) None  Hair Reviewed  Eyes Reviewed  Mouth Reviewed  Skin Reviewed  Nails Reviewed    Diet Order:   Diet Order             Diet NPO time specified Except for: Sips with Meds  Diet effective now                   EDUCATION NEEDS:   Education needs have been addressed  Skin:  Skin Assessment: Reviewed RN Assessment  Last BM:  PTA  Height:   Ht Readings from Last 1 Encounters:  04/06/24 6' (1.829 m)    Weight:   Wt Readings from Last 1 Encounters:  04/06/24 (P) 91.6 kg    Ideal Body Weight:  80.9 kg  BMI:  Body mass index is 27.39 kg/m (pended).  Estimated Nutritional Needs:   Kcal:  2000-2200  Protein:   95-110g  Fluid:  >/= 2L    Josette Glance, MS, RDN, LDN Clinical Dietitian I Please reach out via secure chat

## 2024-04-07 NOTE — Anesthesia Postprocedure Evaluation (Signed)
 Anesthesia Post Note  Patient: Jeffrey Stewart  Procedure(s) Performed: FIXATION, FRACTURE, INTERTROCHANTERIC, WITH INTRAMEDULLARY ROD (Right)     Patient location during evaluation: PACU Anesthesia Type: General Level of consciousness: awake and alert, oriented and patient cooperative Pain management: pain level controlled Vital Signs Assessment: post-procedure vital signs reviewed and stable Respiratory status: spontaneous breathing, nonlabored ventilation, respiratory function stable and patient connected to nasal cannula oxygen Cardiovascular status: blood pressure returned to baseline and stable Postop Assessment: no apparent nausea or vomiting Anesthetic complications: no   No notable events documented.  Last Vitals:  Vitals:   04/07/24 1945 04/07/24 2006  BP: (!) 99/58 (!) 106/56  Pulse: 98 (!) 105  Resp: (!) 23 20  Temp: 36.9 C 36.5 C  SpO2: 93% 95%    Last Pain:  Vitals:   04/07/24 2006  TempSrc: Axillary  PainSc:                  Milca Sytsma,E. Harout Scheurich

## 2024-04-07 NOTE — Anesthesia Procedure Notes (Signed)
 Procedure Name: Intubation Date/Time: 04/07/2024 5:32 PM  Performed by: Mollie Olivia SAUNDERS, CRNAPre-anesthesia Checklist: Patient identified, Emergency Drugs available, Suction available and Patient being monitored Patient Re-evaluated:Patient Re-evaluated prior to induction Oxygen Delivery Method: Circle system utilized Preoxygenation: Pre-oxygenation with 100% oxygen Induction Type: IV induction Ventilation: Oral airway inserted - appropriate to patient size and Two handed mask ventilation required Laryngoscope Size: Glidescope and 4 Tube type: Oral Tube size: 7.5 mm Number of attempts: 1 Airway Equipment and Method: Stylet and Oral airway Placement Confirmation: ETT inserted through vocal cords under direct vision, positive ETCO2 and breath sounds checked- equal and bilateral Tube secured with: Tape Dental Injury: Teeth and Oropharynx as per pre-operative assessment

## 2024-04-07 NOTE — Transfer of Care (Signed)
 Immediate Anesthesia Transfer of Care Note  Patient: Jeffrey Stewart  Procedure(s) Performed: FIXATION, FRACTURE, INTERTROCHANTERIC, WITH INTRAMEDULLARY ROD (Right)  Patient Location: PACU  Anesthesia Type:General  Level of Consciousness: awake  Airway & Oxygen Therapy: Patient Spontanous Breathing and Patient connected to face mask oxygen  Post-op Assessment: Report given to RN and Post -op Vital signs reviewed and stable  Post vital signs: Reviewed and stable  Last Vitals:  Vitals Value Taken Time  BP 93/61 04/07/24 19:11  Temp    Pulse 110 04/07/24 19:15  Resp 42 04/07/24 19:15  SpO2 100 % 04/07/24 19:15  Vitals shown include unfiled device data.  Last Pain:  Vitals:   04/07/24 1627  TempSrc:   PainSc: 1          Complications: No notable events documented.

## 2024-04-07 NOTE — Progress Notes (Signed)
 Progress Note   Patient: Jeffrey Stewart DOB: Sep 12, 1934 DOA: 04/06/2024     1 DOS: the patient was seen and examined on 04/07/2024   Brief hospital course: Jeffrey Stewart is a 89 y.o. male with medical history significant of chronic HFrEF, CAD, and HLD who presented on 9/21 with a mechanical fall. He is found to have right intertrochanteric femur fracture admitted to [facility] service with orthopedic surgery consultation.  Assessment and Plan: Right intertrochanteric femur fracture Apparently mechanical fall resulting in hip fracture Orthopedics consult appreciated. NPO for orthopedic surgical intervention. Continue pain control.  Aspirin  for DVT prophylaxis.   Parotid gland mass Incidental finding on imaging today Consider further imaging with MRI, recommend in the next few weeks post-hospitalization Will need outpatient ENT follow up   Chronic combined CHF Appears compensated Continue torsemide /Kcl. Monitor daily weights, strict input and output.   CAD Continue ASA 81 mg daily   HLD Continue atorvastatin  Good lipid control at most recent check Hold fish oil while hospitalized   HTN Continue carvedilol    ILD Not on home O2 Continue home dose prednisone ; mycophenolate  Continue Albuterol , Claritin  , Duonebs   Glaucoma Continue brimonidine , latanoprost .     Out of bed to chair. Incentive spirometry. Nursing supportive care. Fall, aspiration precautions. Diet:  Diet Orders (From admission, onward)     Start     Ordered   04/07/24 2133  Diet regular Fluid consistency: Thin  Diet effective now       Question:  Fluid consistency:  Answer:  Thin   04/07/24 2132           DVT prophylaxis: SCDs Start: 04/06/24 1904  Level of care: Med-Surg   Code Status: Full Code  Subjective: Patient is seen and examined today morning. He is lying comfortably. Has pain better controlled.  Physical Exam: Vitals:   04/07/24 1945 04/07/24 2006 04/07/24 2024  04/07/24 2133  BP: (!) 99/58 (!) 106/56  (!) 90/56  Pulse: 98 (!) 105  (!) 109  Resp: (!) 23 20  17   Temp: 98.5 F (36.9 C) 97.7 F (36.5 C)    TempSrc:  Axillary    SpO2: 93% 95% 92% 93%  Weight:      Height:        General - Elderly Caucasian male, no apparent distress HEENT - PERRLA, EOMI, atraumatic head, non tender sinuses. Lung - Clear, basal rales, rhonchi, wheezes. Heart - S1, S2 heard, no murmurs, rubs, trace pedal edema. Abdomen - Soft, non tender, bowel sounds good Neuro - Alert, awake and oriented x 3, non focal exam. Skin - Warm and dry.  Data Reviewed:      Latest Ref Rng & Units 04/06/2024    4:58 PM 02/26/2024   10:10 AM 10/24/2023    1:02 PM  CBC  WBC 4.0 - 10.5 K/uL 16.2  9.4  10.3   Hemoglobin 13.0 - 17.0 g/dL 87.0  86.8  88.0   Hematocrit 39.0 - 52.0 % 38.9  40.7  35.5   Platelets 150 - 400 K/uL 113  172  164       Latest Ref Rng & Units 04/06/2024    4:58 PM 10/24/2023    1:02 PM 09/11/2023    5:17 AM  BMP  Glucose 70 - 99 mg/dL 889  893  818   BUN 8 - 23 mg/dL 28  24  50   Creatinine 0.61 - 1.24 mg/dL 8.96  9.02  8.98   Sodium 135 -  145 mmol/L 141  138  141   Potassium 3.5 - 5.1 mmol/L 4.1  4.1  3.9   Chloride 98 - 111 mmol/L 101  102  101   CO2 22 - 32 mmol/L 27  32  28   Calcium  8.9 - 10.3 mg/dL 8.9  8.9  8.7    DG C-Arm 1-60 Min-No Report Result Date: 04/07/2024 Fluoroscopy was utilized by the requesting physician.  No radiographic interpretation.   DG Chest Port 1 View Result Date: 04/06/2024 CLINICAL DATA:  Pre-surgical. EXAM: RIGHT KNEE - 1-2 VIEW; PORTABLE CHEST - 1 VIEW COMPARISON:  09/08/2023, 04/06/2024. FINDINGS: Right knee: No acute fracture or dislocation is seen. There is a bony exostosis along the lateral aspect of the mid femur mild to moderate tricompartmental degenerative changes are noted at the knee. There is a trace joint effusion. Vascular calcifications are present in the soft tissues. Chest: The heart is enlarged and the  mediastinal contour is within normal limits for supine positioning. Atherosclerotic calcification of the aorta is noted. Lung volumes are low with atelectasis or infiltrate at the lung bases. There small bilateral pleural effusions. No pneumothorax is seen. No acute osseous abnormality. IMPRESSION: 1. No acute fracture or dislocation at the right knee. 2. Small bilateral pleural effusions with atelectasis or infiltrate at the lung bases. 3. Cardiomegaly. Electronically Signed   By: Leita Birmingham M.D.   On: 04/06/2024 17:29   DG Knee 1-2 Views Right Result Date: 04/06/2024 CLINICAL DATA:  Pre-surgical. EXAM: RIGHT KNEE - 1-2 VIEW; PORTABLE CHEST - 1 VIEW COMPARISON:  09/08/2023, 04/06/2024. FINDINGS: Right knee: No acute fracture or dislocation is seen. There is a bony exostosis along the lateral aspect of the mid femur mild to moderate tricompartmental degenerative changes are noted at the knee. There is a trace joint effusion. Vascular calcifications are present in the soft tissues. Chest: The heart is enlarged and the mediastinal contour is within normal limits for supine positioning. Atherosclerotic calcification of the aorta is noted. Lung volumes are low with atelectasis or infiltrate at the lung bases. There small bilateral pleural effusions. No pneumothorax is seen. No acute osseous abnormality. IMPRESSION: 1. No acute fracture or dislocation at the right knee. 2. Small bilateral pleural effusions with atelectasis or infiltrate at the lung bases. 3. Cardiomegaly. Electronically Signed   By: Leita Birmingham M.D.   On: 04/06/2024 17:29   DG Hip Unilat W or Wo Pelvis 2-3 Views Right Result Date: 04/06/2024 CLINICAL DATA:  Fall with diffuse pain. EXAM: DG HIP (WITH OR WITHOUT PELVIS) 2-3V RIGHT COMPARISON:  None Available. FINDINGS: There is diffusely decreased mineralization of the bones. There is a nondisplaced intratrochanteric fracture of the proximal right femur. The remaining bony structures are intact  and there is no dislocation. Degenerative changes are present at the bilateral hips, sacroiliac joints and lower lumbar spine. Vascular calcifications are seen in the soft tissues. IMPRESSION: Minimally displaced intertrochanteric fracture of the proximal right femur. Electronically Signed   By: Leita Birmingham M.D.   On: 04/06/2024 16:22   CT Head Wo Contrast Result Date: 04/06/2024 CLINICAL DATA:  Fall with head injury. EXAM: CT HEAD WITHOUT CONTRAST CT CERVICAL SPINE WITHOUT CONTRAST TECHNIQUE: Multidetector CT imaging of the head and cervical spine was performed following the standard protocol without intravenous contrast. Multiplanar CT image reconstructions of the cervical spine were also generated. RADIATION DOSE REDUCTION: This exam was performed according to the departmental dose-optimization program which includes automated exposure control, adjustment of the mA and/or  kV according to patient size and/or use of iterative reconstruction technique. COMPARISON:  CT cervical spine 07/21/2017. FINDINGS: CT HEAD FINDINGS Brain: Ventricles, cisterns and other CSF spaces are normal. Minimal chronic ischemic microvascular disease. No mass, mass effect, shift of midline structures or acute hemorrhage. No evidence of acute infarction. Vascular: No hyperdense vessel or unexpected calcification. Skull: Normal. Negative for fracture or focal lesion. Sinuses/Orbits: Orbits are normal. Minimal opacification over the right sphenoid sinus compatible mild chronic inflammatory change. Other: None. CT CERVICAL SPINE FINDINGS Alignment: Subtle anterior stairstep subluxations over the mid to lower cervical spine unchanged. No posttraumatic subluxation. Skull base and vertebrae: Vertebral body heights are normal. There is mild to moderate spondylosis throughout the cervical spine to include uncovertebral joint spurring and facet arthropathy. Mild to moderate left-sided neural foraminal narrowing at the C2-3 level. Mild bilateral  neural foraminal narrowing at the C3-4 level and moderate right-sided neural foraminal narrowing at the C4-5 level. Minimal left-sided neural foraminal narrowing at the C5-6 level. No acute fracture. Prevertebral soft tissues are normal. Soft tissues and spinal canal: No spinal canal abnormality. Prevertebral soft tissues normal. Disc levels: Disc space narrowing at the C5-6 and C6-7 levels. Also disc space narrowing at the C7-T1 level. Upper chest: No acute findings. Other: Oval mass over the posterior aspect of the right parotid gland measuring 1.2 cm in short axis with significant enlargement compared to the prior exam. IMPRESSION: 1. No acute brain injury. 2. Minimal chronic ischemic microvascular disease. 3. No acute cervical spine injury. 4. Mild to moderate spondylosis throughout the cervical spine with disc disease at the C5-6, C6-7 and C7-T1 levels. Multilevel neural foraminal narrowing as described. 5. 1.2 cm oval mass over the posterior aspect of the right parotid gland with significant enlargement compared to the prior exam. This may represent an enlarged lymph node versus a primary parotid neoplasm. Consider MRI with without contrast for further evaluation. Electronically Signed   By: Toribio Agreste M.D.   On: 04/06/2024 16:15   CT Cervical Spine Wo Contrast Result Date: 04/06/2024 CLINICAL DATA:  Fall with head injury. EXAM: CT HEAD WITHOUT CONTRAST CT CERVICAL SPINE WITHOUT CONTRAST TECHNIQUE: Multidetector CT imaging of the head and cervical spine was performed following the standard protocol without intravenous contrast. Multiplanar CT image reconstructions of the cervical spine were also generated. RADIATION DOSE REDUCTION: This exam was performed according to the departmental dose-optimization program which includes automated exposure control, adjustment of the mA and/or kV according to patient size and/or use of iterative reconstruction technique. COMPARISON:  CT cervical spine 07/21/2017.  FINDINGS: CT HEAD FINDINGS Brain: Ventricles, cisterns and other CSF spaces are normal. Minimal chronic ischemic microvascular disease. No mass, mass effect, shift of midline structures or acute hemorrhage. No evidence of acute infarction. Vascular: No hyperdense vessel or unexpected calcification. Skull: Normal. Negative for fracture or focal lesion. Sinuses/Orbits: Orbits are normal. Minimal opacification over the right sphenoid sinus compatible mild chronic inflammatory change. Other: None. CT CERVICAL SPINE FINDINGS Alignment: Subtle anterior stairstep subluxations over the mid to lower cervical spine unchanged. No posttraumatic subluxation. Skull base and vertebrae: Vertebral body heights are normal. There is mild to moderate spondylosis throughout the cervical spine to include uncovertebral joint spurring and facet arthropathy. Mild to moderate left-sided neural foraminal narrowing at the C2-3 level. Mild bilateral neural foraminal narrowing at the C3-4 level and moderate right-sided neural foraminal narrowing at the C4-5 level. Minimal left-sided neural foraminal narrowing at the C5-6 level. No acute fracture. Prevertebral soft tissues are normal.  Soft tissues and spinal canal: No spinal canal abnormality. Prevertebral soft tissues normal. Disc levels: Disc space narrowing at the C5-6 and C6-7 levels. Also disc space narrowing at the C7-T1 level. Upper chest: No acute findings. Other: Oval mass over the posterior aspect of the right parotid gland measuring 1.2 cm in short axis with significant enlargement compared to the prior exam. IMPRESSION: 1. No acute brain injury. 2. Minimal chronic ischemic microvascular disease. 3. No acute cervical spine injury. 4. Mild to moderate spondylosis throughout the cervical spine with disc disease at the C5-6, C6-7 and C7-T1 levels. Multilevel neural foraminal narrowing as described. 5. 1.2 cm oval mass over the posterior aspect of the right parotid gland with significant  enlargement compared to the prior exam. This may represent an enlarged lymph node versus a primary parotid neoplasm. Consider MRI with without contrast for further evaluation. Electronically Signed   By: Toribio Agreste M.D.   On: 04/06/2024 16:15    Family Communication: Discussed with patient, understand and agree. All questions answered.  Disposition: Status is: Inpatient Remains inpatient appropriate because: orthopedic intervention.  Planned Discharge Destination: Home with Home Health     Time spent: 43 minutes  Author: Concepcion Riser, MD 04/07/2024 9:35 PM Secure chat 7am to 7pm For on call review [email or web address].

## 2024-04-07 NOTE — TOC CAGE-AID Note (Signed)
 Transition of Care [facility]) - CAGE-AID Screening   Patient Details  Name: Jeffrey Stewart MRN: [id number] Date of Birth: 04/08/1935  [facility] CM/SW Contact:    Cayla Wiegand E Dorthy Hustead, LCSW Phone Number: 04/07/2024, 10:00 AM   Clinical Narrative: No SA noted.   CAGE-AID Screening:    Have You Ever Felt You Ought to Cut Down on Your Drinking or Drug Use?: No Have People Annoyed You By Critizing Your Drinking Or Drug Use?: No Have You Felt Bad Or Guilty About Your Drinking Or Drug Use?: No Have You Ever Had a Drink or Used Drugs First Thing In The Morning to Steady Your Nerves or to Get Rid of a Hangover?: No CAGE-AID Score: 0  Substance Abuse Education Offered: No

## 2024-04-07 NOTE — Anesthesia Preprocedure Evaluation (Addendum)
 Anesthesia Evaluation  Patient identified by MRN, date of birth, ID band Patient awake    Reviewed: Allergy & Precautions, NPO status , Patient's Chart, lab work & pertinent test results  Airway Mallampati: III  TM Distance: >3 FB Neck ROM: Full    Dental  (+) Edentulous Upper, Edentulous Lower   Pulmonary shortness of breath, asthma , pneumonia   Pulmonary exam normal breath sounds clear to auscultation       Cardiovascular + CAD, + Past MI and +CHF  + dysrhythmias + Valvular Problems/Murmurs AI  Rhythm:Regular Rate:Normal  Echo 08/2021  1. Left ventricular ejection fraction, by estimation, is 50%. The left  ventricle has low normal function. The left ventricle demonstrates global  hypokinesis. There is severe concentric left ventricular hypertrophy. Left  ventricular diastolic parameters  are consistent with Grade I diastolic dysfunction (impaired relaxation).   2. Right ventricular systolic function is moderately reduced. The right  ventricular size is normal.   3. The mitral valve is normal in structure. Trivial mitral valve  regurgitation. No evidence of mitral stenosis.   4. The aortic valve is normal in structure. Aortic valve regurgitation is  mild to moderate. No aortic stenosis is present. Aortic regurgitation PHT  measures 478 msec.   5. Aortic dilatation noted. There is mild dilatation of the ascending  aorta, measuring 40 mm.     Neuro/Psych negative neurological ROS     GI/Hepatic negative GI ROS, Neg liver ROS,,,  Endo/Other  negative endocrine ROS    Renal/GU Renal disease     Musculoskeletal  (+) Arthritis ,    Abdominal   Peds  Hematology negative hematology ROS (+)   Anesthesia Other Findings   Reproductive/Obstetrics                              Anesthesia Physical Anesthesia Plan  ASA: 4  Anesthesia Plan: General   Post-op Pain Management: Ofirmev  IV  (intra-op)*   Induction: Intravenous  PONV Risk Score and Plan: 3 and Ondansetron , Dexamethasone  and Treatment may vary due to age or medical condition  Airway Management Planned: Oral ETT  Additional Equipment:   Intra-op Plan:   Post-operative Plan: Extubation in OR  Informed Consent: I have reviewed the patients History and Physical, chart, labs and discussed the procedure including the risks, benefits and alternatives for the proposed anesthesia with the patient or authorized representative who has indicated his/her understanding and acceptance.     Dental advisory given  Plan Discussed with: CRNA  Anesthesia Plan Comments:          Anesthesia Quick Evaluation

## 2024-04-07 NOTE — Progress Notes (Addendum)
 Orthopedic Surgery Progress Note   Assessment: Patient is a 89 y.o. male with right intertrochanteric femur fracture status post CMN   Plan: -Operative plans: complete -Diet: regular -DVT ppx: aspirin  81mg  BID -Antibiotics: ancef  x2 post-op doses -Weight bearing status: as tolerated -PT/OT evaluate and treat -Pain control -Dispo: to floor from PACU  ___________________________________________________________________________  Subjective: No acute events since surgery. Recovering in PACU. Pain controlled.    Physical Exam:  General: no acute distress, appears stated age Neurologic: alert, answering questions appropriately, following commands Respiratory: unlabored breathing on nasal canula  MSK:   -Right lower extremity  Dressings over hip c/d/i EHL/TA/GSC intact Plantarflexes and dorsiflexes toes Sensation intact to light touch in sural, saphenous, tibial, deep peroneal, and superficial peroneal nerve distributions Foot warm and well perfused   Yesterday's total administered Morphine  Milligram Equivalents: 33   Patient name: Jeffrey Stewart Patient MRN: [id number] Date: 04/07/24

## 2024-04-07 NOTE — Progress Notes (Signed)
 Dr Kelly Mace at bedside to round on patient.  Patient has denied pain. Baseline mentation questionable, no family present to verify baseline. Patient has answered his name and birth date appropriately.  Satisfied with  patient's blood pressure after receiving albumin , and approved return to [address] [place]. 1950 Dr. Georgina at bedside to speak with and  assess patient as well. No concerns voiced per Dr. Georgina. Report called to receiving nurse Norlene, RN. Assured nurse that both Dr. Mace and Dr. Georgina  have assessed the patient and cleared him for transfer back to [facility]. No further questions or concerns voiced per Norlene, RN. Patient prepared for transport back to [facility] [place] and transported via hospital bed by PACU RN's Beckey Chard, RN and Bernardino Breslow, RN.

## 2024-04-07 NOTE — Plan of Care (Signed)

## 2024-04-08 ENCOUNTER — Encounter (HOSPITAL_COMMUNITY): Payer: Self-pay | Admitting: Orthopedic Surgery

## 2024-04-08 DIAGNOSIS — S72141A Displaced intertrochanteric fracture of right femur, initial encounter for closed fracture: Secondary | ICD-10-CM | POA: Diagnosis not present

## 2024-04-08 DIAGNOSIS — S72001A Fracture of unspecified part of neck of right femur, initial encounter for closed fracture: Secondary | ICD-10-CM | POA: Diagnosis not present

## 2024-04-08 DIAGNOSIS — I5042 Chronic combined systolic (congestive) and diastolic (congestive) heart failure: Secondary | ICD-10-CM | POA: Diagnosis not present

## 2024-04-08 DIAGNOSIS — E782 Mixed hyperlipidemia: Secondary | ICD-10-CM | POA: Diagnosis not present

## 2024-04-08 LAB — CBC
HCT: 27.1 % — ABNORMAL LOW (ref 39.0–52.0)
Hemoglobin: 9 g/dL — ABNORMAL LOW (ref 13.0–17.0)
MCH: 35.9 pg — ABNORMAL HIGH (ref 26.0–34.0)
MCHC: 33.2 g/dL (ref 30.0–36.0)
MCV: 108 fL — ABNORMAL HIGH (ref 80.0–100.0)
Platelets: 116 K/uL — ABNORMAL LOW (ref 150–400)
RBC: 2.51 MIL/uL — ABNORMAL LOW (ref 4.22–5.81)
RDW: 17.3 % — ABNORMAL HIGH (ref 11.5–15.5)
WBC: 11.9 K/uL — ABNORMAL HIGH (ref 4.0–10.5)
nRBC: 0.3 % — ABNORMAL HIGH (ref 0.0–0.2)

## 2024-04-08 NOTE — Progress Notes (Signed)
 Orthopedic Surgery Progress Note   Assessment: Patient is a 89 y.o. male with right intertrochanteric femur fracture status post CMN   Plan: -Operative plans: complete -Diet: regular -DVT ppx: aspirin  81mg  BID -Antibiotics: ancef  x2 post-op doses -Weight bearing status: as tolerated -PT/OT evaluate and treat -Pain control -Dispo: per primary  ___________________________________________________________________________  Subjective: No acute events overnight. Pain well controlled. Not having much pain in the hip this morning. Has not tried mobilizing yet.   Physical Exam:  General: no acute distress, appears stated age Neurologic: alert, answering questions appropriately, following commands Respiratory: unlabored breathing on nasal canula  MSK:   -Right lower extremity  Dressings over hip with small amount of blood but otherwise c/d/i EHL/TA/GSC intact Plantarflexes and dorsiflexes toes Sensation intact to light touch in sural, saphenous, tibial, deep peroneal, and superficial peroneal nerve distributions Foot warm and well perfused   Yesterday's total administered Morphine  Milligram Equivalents: 60   Patient name: Jeffrey Stewart Patient MRN: [id number] Date: 04/08/24

## 2024-04-08 NOTE — Evaluation (Signed)
 Physical Therapy Evaluation  Patient Details Name: Jeffrey Stewart MRN: [id number] DOB: 11-02-1934 Today's Date: 04/08/2024  History of Present Illness  Pt is an 89 y.o. male presenting 9/21 after fall at home. Found to have R intertrochanteric hip fx. S/p cephalomedullary nail with intramedullary rodding 9/22. PMH: HFrEF, CAD, RA, and HLD  Clinical Impression  Pt admitted with above diagnosis. Pt currently with functional limitations due to the deficits listed below (see PT Problem List). At the time of PT eval pt was able to perform transfers with up to heavy +2 assist. Pt unable to manage transfers with RW and required 2 person HHA. Unable to progress to gait training this session. Based on performance today recommend continued skilled PT services <3 hours/day. Pt will benefit from acute skilled PT to increase their independence and safety with mobility to allow discharge.           If plan is discharge home, recommend the following: Two people to help with walking and/or transfers;A lot of help with bathing/dressing/bathroom;Assistance with cooking/housework;Assist for transportation;Help with stairs or ramp for entrance   Can travel by private vehicle   No    Equipment Recommendations None recommended by PT  Recommendations for Other Services       Functional Status Assessment Patient has had a recent decline in their functional status and demonstrates the ability to make significant improvements in function in a reasonable and predictable amount of time.     Precautions / Restrictions Precautions Precautions: Fall Recall of Precautions/Restrictions: Intact Restrictions Weight Bearing Restrictions Per Provider Order: Yes RLE Weight Bearing Per Provider Order: Weight bearing as tolerated      Mobility  Bed Mobility Overal bed mobility: Needs Assistance Bed Mobility: Supine to Sit     Supine to sit: Mod assist, +2 for safety/equipment, +2 for physical assistance      General bed mobility comments: Multimodal cues for transition to EOB. Pt required assist to advance RLE, and elevate trunk to full sitting position.    Transfers Overall transfer level: Needs assistance Equipment used: Rolling walker (2 wheels), 2 person hand held assist Transfers: Sit to/from Stand, Bed to chair/wheelchair/BSC Sit to Stand: Mod assist, +2 safety/equipment, +2 physical assistance (with/to RW for STS) Stand pivot transfers: Mod assist, +2 physical assistance, +2 safety/equipment (+2 HHA)         General transfer comment: Pt required up to +2 mod A for rise and cues for safety; pt with RLE buckling with attempts to take steps with RW. Returned to sit with removal of RW and 2 person HHA utilized for stand pivot transfer bed>chair.    Ambulation/Gait               General Gait Details: Unable to progress to gait training at this time.  Stairs            Wheelchair Mobility     Tilt Bed    Modified Rankin (Stroke Patients Only)       Balance Overall balance assessment: Needs assistance Sitting-balance support: No upper extremity supported, Feet supported   Sitting balance - Comments: poor progressing to fair; initially with heavy posterior lean   Standing balance support: Bilateral upper extremity supported, During functional activity, Reliant on assistive device for balance Standing balance-Leahy Scale: Poor Standing balance comment: reliant on device and external support  Pertinent Vitals/Pain Pain Assessment Pain Assessment: 0-10 Pain Score: 1  Pain Location: hip Pain Descriptors / Indicators: Operative site guarding Pain Intervention(s): Limited activity within patient's tolerance, Monitored during session, Repositioned    Home Living Family/patient expects to be discharged to:: Private residence Living Arrangements: Spouse/significant other Available Help at Discharge: Family;Available 24  hours/day Type of Home: House Home Access: Stairs to enter Entrance Stairs-Rails: Right Entrance Stairs-Number of Steps: 3   Home Layout: One level Home Equipment: BSC/3in1;Grab bars - tub/shower;Rolling Walker (2 wheels);Tub bench;Cane - single point      Prior Function Prior Level of Function : Independent/Modified Independent             Mobility Comments: Uses a cane when going out until he can get to the shopping cart ADLs Comments: Independent     Extremity/Trunk Assessment   Upper Extremity Assessment Upper Extremity Assessment: Overall WFL for tasks assessed    Lower Extremity Assessment Lower Extremity Assessment: RLE deficits/detail RLE Deficits / Details: Acute pain, decreased strength and AROM consistent with pre-op diagnosis and subsequent surgery. RLE: Unable to fully assess due to pain    Cervical / Trunk Assessment Cervical / Trunk Assessment: Other exceptions Cervical / Trunk Exceptions: Forward head posture with rounded shoulders  Communication   Communication Communication: No apparent difficulties    Cognition Arousal: Alert Behavior During Therapy: WFL for tasks assessed/performed                             Following commands: Intact       Cueing Cueing Techniques: Verbal cues, Gestural cues, Tactile cues     General Comments General comments (skin integrity, edema, etc.): VSS. HR up to 127 with minimal mobility; pt with DOE, but no noted SpO2 drop on RA    Exercises     Assessment/Plan    PT Assessment Patient needs continued PT services  PT Problem List Decreased strength;Decreased range of motion;Decreased activity tolerance;Decreased balance;Decreased mobility;Decreased knowledge of use of DME;Decreased safety awareness;Decreased knowledge of precautions;Pain       PT Treatment Interventions DME instruction;Gait training;Stair training;Functional mobility training;Therapeutic activities;Therapeutic exercise;Balance  training;Patient/family education    PT Goals (Current goals can be found in the Care Plan section)  Acute Rehab PT Goals Patient Stated Goal: Home at d/c PT Goal Formulation: With patient/family Time For Goal Achievement: 04/15/24 Potential to Achieve Goals: Good    Frequency Min 3X/week     Co-evaluation PT/OT/SLP Co-Evaluation/Treatment: Yes Reason for Co-Treatment: For patient/therapist safety;To address functional/ADL transfers PT goals addressed during session: Mobility/safety with mobility OT goals addressed during session: ADL's and self-care       AM-PAC PT 6 Clicks Mobility  Outcome Measure Help needed turning from your back to your side while in a flat bed without using bedrails?: Total Help needed moving from lying on your back to sitting on the side of a flat bed without using bedrails?: Total Help needed moving to and from a bed to a chair (including a wheelchair)?: Total Help needed standing up from a chair using your arms (e.g., wheelchair or bedside chair)?: Total Help needed to walk in hospital room?: Total Help needed climbing 3-5 steps with a railing? : Total 6 Click Score: 6    End of Session Equipment Utilized During Treatment: Gait belt Activity Tolerance: Patient limited by fatigue Patient left: in chair;with call bell/phone within reach;with chair alarm set;with family/visitor present (OT present) Nurse Communication: Mobility status PT  Visit Diagnosis: Unsteadiness on feet (R26.81);Pain Pain - Right/Left: Right Pain - part of body: Hip    Time: 8896-8873 PT Time Calculation (min) (ACUTE ONLY): 23 min   Charges:   PT Evaluation $PT Eval Moderate Complexity: 1 Mod PT Treatments $Gait Training: 8-22 mins PT General Charges $$ ACUTE PT VISIT: 1 Visit         Leita Sable, PT, DPT Acute Rehabilitation Services Secure Chat Preferred Office: [phone]   Leita JONETTA Sable 04/08/2024, 12:42 PM

## 2024-04-08 NOTE — Evaluation (Signed)
 Occupational Therapy Evaluation Patient Details Name: Jeffrey Stewart MRN: [id number] DOB: 1935/03/04 Today's Date: 04/08/2024   History of Present Illness   Pt is an 89 y.o. male presenting 9/21 after fall at home. Found to have R intertrochanteric hip fx. S/p OFIC with intramedullary rodding 9/22. PMH: HFrEF, CAD, RA, and HLD     Clinical Impressions PTA, pt lived with wife and was independent in ADL and IADL. Upon eval, pt presents with decreased RLE strength, generalized weakness, balance, knowledge of precautions and use of compensatory techniques. Pt needing up to set-up for UB ADL and max A +2 for LB ADL. Pt to continue to benefit from acute OT services. Patient will benefit from continued inpatient follow up therapy, <3 hours/day       If plan is discharge home, recommend the following:   Two people to help with walking and/or transfers;A lot of help with bathing/dressing/bathroom;Two people to help with bathing/dressing/bathroom;Assistance with cooking/housework;Assist for transportation;Help with stairs or ramp for entrance     Functional Status Assessment   Patient has had a recent decline in their functional status and demonstrates the ability to make significant improvements in function in a reasonable and predictable amount of time.     Equipment Recommendations   Other (comment) (defer)     Recommendations for Other Services         Precautions/Restrictions   Precautions Precautions: Fall Restrictions Weight Bearing Restrictions Per Provider Order: Yes     Mobility Bed Mobility Overal bed mobility: Needs Assistance Bed Mobility: Supine to Sit     Supine to sit: Mod assist, +2 for safety/equipment, +2 for physical assistance     General bed mobility comments: assist for all portions as well as dense cues for problem solving to move R in light of new deficits    Transfers Overall transfer level: Needs assistance Equipment used: Rolling walker  (2 wheels), 2 person hand held assist Transfers: Sit to/from Stand, Bed to chair/wheelchair/BSC Sit to Stand: Mod assist, +2 safety/equipment, +2 physical assistance (with/to RW for STS) Stand pivot transfers: Mod assist, +2 physical assistance, +2 safety/equipment (+2 HHA)         General transfer comment: mod AI 2 for rise and cues for safety; pt with RLE buckling with attempts to take steps. returned to sit with removal of RW and 2 person HHA with set-up of tsf with chair right next to pt. Mod+2 for stand pivot      Balance Overall balance assessment: Needs assistance Sitting-balance support: No upper extremity supported, Feet supported   Sitting balance - Comments: poor progressing to fair; initially with heavy posterior lean   Standing balance support: Bilateral upper extremity supported, During functional activity, Reliant on assistive device for balance Standing balance-Leahy Scale: Poor Standing balance comment: reliant on device and external support                           ADL either performed or assessed with clinical judgement   ADL Overall ADL's : Needs assistance/impaired Eating/Feeding: Set up;Sitting Eating/Feeding Details (indicate cue type and reason): with back support in recliner Grooming: Contact guard assist;Sitting   Upper Body Bathing: Contact guard assist;Sitting   Lower Body Bathing: Moderate assistance;+2 for safety/equipment;+2 for physical assistance;Sit to/from stand   Upper Body Dressing : Sitting;Contact guard assist   Lower Body Dressing: Maximal assistance;+2 for safety/equipment;+2 for physical assistance;Sit to/from stand Lower Body Dressing Details (indicate cue type and reason): pt able  to thread pants with mod I sitting EOB, but ultimately max A due to need to manage pants in standing and assist for rise Toilet Transfer: Moderate assistance;+2 for safety/equipment;+2 for physical assistance;Stand-pivot Toilet Transfer Details  (indicate cue type and reason): +2 HHA         Functional mobility during ADLs: Moderate assistance;+2 for physical assistance;+2 for safety/equipment       Vision Ability to See in Adequate Light: 0 Adequate Patient Visual Report: No change from baseline Vision Assessment?: No apparent visual deficits     Perception Perception: Not tested       Praxis Praxis: Not tested       Pertinent Vitals/Pain       Extremity/Trunk Assessment Upper Extremity Assessment Upper Extremity Assessment: Overall WFL for tasks assessed   Lower Extremity Assessment Lower Extremity Assessment: Defer to PT evaluation       Communication Communication Communication: No apparent difficulties   Cognition Arousal: Alert Behavior During Therapy: WFL for tasks assessed/performed Cognition: No apparent impairments                               Following commands: Intact       Cueing  General Comments   Cueing Techniques: Verbal cues;Gestural cues;Tactile cues  VSS. HR up to 127 with minimal mobility; pt with DOE, but no noted SpO2 drop on RA   Exercises Exercises: Other exercises Other Exercises Other Exercises: encouraged pt to perform 20x ankle pumps/hour, quad sets, and glute sets   Shoulder Instructions      Home Living Family/patient expects to be discharged to:: Private residence Living Arrangements: Spouse/significant other Available Help at Discharge: Family;Available 24 hours/day Type of Home: House Home Access: Stairs to enter [employer] of Steps: 3 Entrance Stairs-Rails: Right Home Layout: One level     Bathroom Shower/Tub: Tub/shower unit         Home Equipment: BSC/3in1;Grab bars - tub/shower;Rolling Walker (2 wheels);Tub bench;Cane - single point          Prior Functioning/Environment Prior Level of Function : Independent/Modified Independent             Mobility Comments: Uses a cane when going out until he can get to  the shopping cart ADLs Comments: Independent    OT Problem List: Decreased strength;Decreased activity tolerance;Impaired balance (sitting and/or standing);Decreased safety awareness;Decreased knowledge of use of DME or AE;Decreased knowledge of precautions;Pain   OT Treatment/Interventions: Self-care/ADL training;Therapeutic exercise;DME and/or AE instruction;Therapeutic activities;Patient/family education;Balance training      OT Goals(Current goals can be found in the care plan section)   Acute Rehab OT Goals Patient Stated Goal: get better OT Goal Formulation: With patient Time For Goal Achievement: 04/22/24 Potential to Achieve Goals: Good   OT Frequency:  Min 2X/week    Co-evaluation PT/OT/SLP Co-Evaluation/Treatment: Yes Reason for Co-Treatment: For patient/therapist safety;To address functional/ADL transfers PT goals addressed during session: Mobility/safety with mobility OT goals addressed during session: ADL's and self-care      AM-PAC OT 6 Clicks Daily Activity     Outcome Measure Help from another person eating meals?: A Little Help from another person taking care of personal grooming?: A Little Help from another person toileting, which includes using toliet, bedpan, or urinal?: A Lot Help from another person bathing (including washing, rinsing, drying)?: A Lot Help from another person to put on and taking off regular upper body clothing?: A Little Help from another person to  put on and taking off regular lower body clothing?: A Lot 6 Click Score: 15   End of Session Equipment Utilized During Treatment: Gait belt;Rolling walker (2 wheels) Nurse Communication: Mobility status  Activity Tolerance: Patient tolerated treatment well Patient left: in chair;with call bell/phone within reach;with chair alarm set;with family/visitor present  OT Visit Diagnosis: Unsteadiness on feet (R26.81);Muscle weakness (generalized) (M62.81);History of falling (Z91.81);Pain Pain  - Right/Left: Right Pain - part of body: Hip                Time: 8889-8861 OT Time Calculation (min): 28 min Charges:  OT General Charges $OT Visit: 1 Visit OT Evaluation $OT Eval Moderate Complexity: 1 Mod  Elma JONETTA Lebron FREDERICK, OTR/L [facility] Acute Rehabilitation Office: [phone]   Elma JONETTA Lebron 04/08/2024, 11:55 AM

## 2024-04-08 NOTE — TOC Initial Note (Signed)
 Transition of Care [facility]) - Initial/Assessment Note    Patient Details  Name: Jeffrey Stewart MRN: [id number] Date of Birth: 06-Apr-1935  Transition of Care [facility]) CM/SW Contact:    Bridget Cordella Simmonds, LCSW Phone Number: 04/08/2024, 12:12 PM  Clinical Narrative:    CSW spoke with pt, wife Maceo, daughter Marval regarding PT recommendation for SNF.  Pt from home with wife, Marval lives across the street.  Pt has 4 daughters who are available to assist.  Pt declining SNF, wife just completed HH and he would like HH instead.  DME in home: walker, cane, shower chair: no DME requested.  RNCM informed.               Expected Discharge Plan: Home w Home Health Services Barriers to Discharge: Continued Medical Work up   Patient Goals and CMS Choice Patient states their goals for this hospitalization and ongoing recovery are:: do sommersaults          Expected Discharge Plan and Services In-house Referral: Clinical Social Work   Post Acute Care Choice: Home Health Living arrangements for the past 2 months: Single Family Home                                      Prior Living Arrangements/Services Living arrangements for the past 2 months: Single Family Home Lives with:: Spouse Patient language and need for interpreter reviewed:: Yes Do you feel safe going back to the place where you live?: Yes      Need for Family Participation in Patient Care: Yes (Comment) Care giver support system in place?: Yes (comment) Current home services: Other (comment) (none) Criminal Activity/Legal Involvement Pertinent to Current Situation/Hospitalization: No - Comment as needed  Activities of Daily Living   ADL Screening (condition at time of admission) Independently performs ADLs?: No Does the patient have a NEW difficulty with bathing/dressing/toileting/self-feeding that is expected to last >3 days?: Yes (Initiates electronic notice to provider for possible OT consult) Does the patient have  a NEW difficulty with getting in/out of bed, walking, or climbing stairs that is expected to last >3 days?: Yes (Initiates electronic notice to provider for possible PT consult) Does the patient have a NEW difficulty with communication that is expected to last >3 days?: No Is the patient deaf or have difficulty hearing?: No Does the patient have difficulty seeing, even when wearing glasses/contacts?: No Does the patient have difficulty concentrating, remembering, or making decisions?: No  Permission Sought/Granted Permission sought to share information with : Family Supports Permission granted to share information with : Yes, Verbal Permission Granted  Share Information with NAME: wife Maceo, all 4 daughters           Emotional Assessment Appearance:: Appears stated age Attitude/Demeanor/Rapport: Engaged Affect (typically observed): Appropriate, Pleasant Orientation: : Oriented to Self, Oriented to Place, Oriented to  Time, Oriented to Situation      Admission diagnosis:  Hip fracture (HCC) [S72.009A] Parotid nodule [K11.8] Minor head injury, initial encounter [S09.90XA] Fall, initial encounter [W19.XXXA] Closed displaced intertrochanteric fracture of right femur, initial encounter [facility]) [S72.141A] Patient Active Problem List   Diagnosis Date Noted   Closed displaced intertrochanteric fracture of right femur (HCC) 04/07/2024   Malnutrition of moderate degree 04/07/2024   Hip fracture (HCC) 04/06/2024   Chronic combined systolic (congestive) and diastolic (congestive) heart failure (HCC) 04/06/2024   ILD (interstitial lung disease) (HCC) 04/06/2024   Glaucoma (  increased eye pressure) 04/06/2024   Parotid mass 04/06/2024   Neck pain 04/06/2024   Pneumonia 09/07/2023   Rheumatoid arthritis (HCC) 09/07/2023   AKI (acute kidney injury) 09/07/2023   Thrombocytopenia 04/25/2021   Macrocytosis 01/21/2021   Bilateral inguinal hernia 09/19/2013   RBBB (right bundle branch block)  05/08/2011   CAD (coronary artery disease) 10/27/2010   CHF (congestive heart failure) (HCC) 10/27/2010   Hyperlipidemia 10/27/2010   PCP:  Anita Bernardino [name], FNP Pharmacy:   [facility] [phone] - [place], [place] - [address] [place] [address] Phone: [phone] Fax: [phone]  [place] [facility] [address] [place] [address] Phone: [phone] Fax: [phone]  [facility] - [place], [place] - [address] [place] [address] Phone: [phone] Fax: [phone]     Social Drivers of Health (SDOH) Social History: SDOH Screenings   Food Insecurity: No Food Insecurity (04/07/2024)  Housing: Low Risk  (04/07/2024)  Transportation Needs: No Transportation Needs (04/07/2024)  Utilities: Not At Risk (04/07/2024)  Social Connections: Moderately Integrated (04/07/2024)  Tobacco Use: Low Risk  (04/07/2024)   SDOH Interventions:     Readmission Risk Interventions    09/10/2023   12:52 PM  Readmission Risk Prevention Plan  Transportation Screening Complete  PCP or Specialist Appt within 5-7 Days Complete  Home Care Screening Complete  Medication Review (RN CM) Complete

## 2024-04-08 NOTE — Progress Notes (Signed)
 Progress Note   Patient: Jeffrey Stewart FMW:[id number] DOB: 13-Aug-1934 DOA: 04/06/2024     2 DOS: the patient was seen and examined on 04/08/2024   Brief hospital course: Jeffrey Stewart is a 89 y.o. male with medical history significant of chronic HFrEF, CAD, and HLD who presented on 9/21 with a mechanical fall. He is found to have right intertrochanteric femur fracture admitted to [facility] service with orthopedic surgery consultation.  Assessment and Plan: Right intertrochanteric femur fracture S/p ORIF with intramedullary rodding. Orthopedics follow up appreciated. WBAT with PT/ OT. Continue pain control.  Aspirin  for DVT prophylaxis.   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 Consider further imaging with MRI, recommend in the next few weeks post-hospitalization Will need outpatient ENT follow up   Chronic combined CHF Appears compensated Continue torsemide /Kcl. Monitor daily weights, strict input and output.   CAD Continue ASA 81 mg daily   HLD Continue atorvastatin  Hold fish oil while hospitalized   HTN Continue carvedilol    ILD Not on home O2 Continue home dose prednisone ; mycophenolate  Continue Albuterol , Claritin , Duonebs. Pulmonary follow up outpatient.   Glaucoma Continue brimonidine , latanoprost .     Out of bed to chair. Incentive spirometry. Nursing supportive care. Fall, aspiration precautions. Diet:  Diet Orders (From admission, onward)     Start     Ordered   04/07/24 2133  Diet regular Fluid consistency: Thin  Diet effective now       Question:  Fluid consistency:  Answer:  Thin   04/07/24 2132           DVT prophylaxis: SCDs Start: 04/06/24 1904  Level of care: Med-Surg   Code Status: Full Code  Subjective: Patient is seen and examined today morning. He is lying comfortably. Family at bedside. Advised to work with PT/ OT.  Physical Exam: Vitals:   04/08/24 0422 04/08/24 0722 04/08/24 0903 04/08/24 1513  BP: (!) 116/58 (!)  109/57  (!) 102/55  Pulse: (!) 103 98    Resp: 18 17  18   Temp: 97.9 F (36.6 C) 98.2 F (36.8 C)  98.2 F (36.8 C)  TempSrc: Oral Oral    SpO2: 94% 95% 95% 98%  Weight:      Height:        General - Elderly Caucasian male, no apparent distress HEENT - PERRLA, EOMI, atraumatic head, non tender sinuses. Lung - Clear, basal rales, rhonchi, wheezes. Heart - S1, S2 heard, no murmurs, rubs, trace pedal edema. Abdomen - Soft, non tender, bowel sounds good Neuro - Alert, awake and oriented x 3, non focal exam. Skin - Warm and dry.  Data Reviewed:      Latest Ref Rng & Units 04/08/2024    4:48 AM 04/06/2024    4:58 PM 02/26/2024   10:10 AM  CBC  WBC 4.0 - 10.5 K/uL 11.9  16.2  9.4   Hemoglobin 13.0 - 17.0 g/dL 9.0  87.0  86.8   Hematocrit 39.0 - 52.0 % 27.1  38.9  40.7   Platelets 150 - 400 K/uL 116  113  172       Latest Ref Rng & Units 04/06/2024    4:58 PM 10/24/2023    1:02 PM 09/11/2023    5:17 AM  BMP  Glucose 70 - 99 mg/dL 889  893  818   BUN 8 - 23 mg/dL 28  24  50   Creatinine 0.61 - 1.24 mg/dL 8.96  9.02  8.98   Sodium 135 - 145  mmol/L 141  138  141   Potassium 3.5 - 5.1 mmol/L 4.1  4.1  3.9   Chloride 98 - 111 mmol/L 101  102  101   CO2 22 - 32 mmol/L 27  32  28   Calcium  8.9 - 10.3 mg/dL 8.9  8.9  8.7    DG HIP UNILAT WITH PELVIS 2-3 VIEWS RIGHT Result Date: 04/07/2024 CLINICAL DATA:  Postop internal fixation EXAM: DG HIP (WITH OR WITHOUT PELVIS) 2-3V RIGHT COMPARISON:  04/06/2024 FINDINGS: Changes of internal fixation across the right femoral intertrochanteric fracture. Anatomic alignment. No hardware complicating feature. IMPRESSION: Internal fixation.  No hardware complicating feature. Electronically Signed   By: Franky Crease M.D.   On: 04/07/2024 23:15   DG HIP UNILAT WITH PELVIS 2-3 VIEWS RIGHT Result Date: 04/07/2024 CLINICAL DATA:  Right hip internal fixation. EXAM: DG HIP (WITH OR WITHOUT PELVIS) 2-3V RIGHT COMPARISON:  04/06/2024 FINDINGS: Multiple  intraoperative spot images demonstrate internal fixation across the right femoral intertrochanteric fracture. No hardware complicating feature. IMPRESSION: Internal fixation with anatomic alignment.  No complicating feature. Electronically Signed   By: Franky Crease M.D.   On: 04/07/2024 23:07   DG C-Arm 1-60 Min-No Report Result Date: 04/07/2024 Fluoroscopy was utilized by the requesting physician.  No radiographic interpretation.    Family Communication: Discussed with patient, family at bedside, they understand and agree. All questions answered.  Disposition: Status is: Inpatient Remains inpatient appropriate because: orthopedic intervention.  Planned Discharge Destination: Home with Home Health     Time spent: 44 minutes  Author: Concepcion Riser, MD 04/08/2024 7:15 PM Secure chat 7am to 7pm For on call review [email or web address].

## 2024-04-08 NOTE — Plan of Care (Signed)

## 2024-04-09 ENCOUNTER — Other Ambulatory Visit (HOSPITAL_COMMUNITY): Payer: Self-pay

## 2024-04-09 DIAGNOSIS — I5042 Chronic combined systolic (congestive) and diastolic (congestive) heart failure: Secondary | ICD-10-CM | POA: Diagnosis not present

## 2024-04-09 DIAGNOSIS — S72001A Fracture of unspecified part of neck of right femur, initial encounter for closed fracture: Secondary | ICD-10-CM | POA: Diagnosis not present

## 2024-04-09 DIAGNOSIS — S72141A Displaced intertrochanteric fracture of right femur, initial encounter for closed fracture: Secondary | ICD-10-CM | POA: Diagnosis not present

## 2024-04-09 DIAGNOSIS — D62 Acute posthemorrhagic anemia: Secondary | ICD-10-CM | POA: Insufficient documentation

## 2024-04-09 DIAGNOSIS — E782 Mixed hyperlipidemia: Secondary | ICD-10-CM | POA: Diagnosis not present

## 2024-04-09 LAB — CBC
HCT: 20.4 % — ABNORMAL LOW (ref 39.0–52.0)
Hemoglobin: 6.9 g/dL — CL (ref 13.0–17.0)
MCH: 35.9 pg — ABNORMAL HIGH (ref 26.0–34.0)
MCHC: 33.8 g/dL (ref 30.0–36.0)
MCV: 106.3 fL — ABNORMAL HIGH (ref 80.0–100.0)
Platelets: 106 K/uL — ABNORMAL LOW (ref 150–400)
RBC: 1.92 MIL/uL — ABNORMAL LOW (ref 4.22–5.81)
RDW: 17.7 % — ABNORMAL HIGH (ref 11.5–15.5)
WBC: 12.3 K/uL — ABNORMAL HIGH (ref 4.0–10.5)
nRBC: 0.3 % — ABNORMAL HIGH (ref 0.0–0.2)

## 2024-04-09 LAB — PREPARE RBC (CROSSMATCH)

## 2024-04-09 LAB — HEMOGLOBIN AND HEMATOCRIT, BLOOD
HCT: 23.3 % — ABNORMAL LOW (ref 39.0–52.0)
Hemoglobin: 7.9 g/dL — ABNORMAL LOW (ref 13.0–17.0)

## 2024-04-09 MED ORDER — ASPIRIN 81 MG PO TBEC
81.0000 mg | DELAYED_RELEASE_TABLET | Freq: Two times a day (BID) | ORAL | 0 refills | Status: DC
  Filled 2024-04-09: qty 84, 42d supply, fill #0

## 2024-04-09 MED ORDER — ASPIRIN 81 MG PO TBEC: 81.0000 mg | DELAYED_RELEASE_TABLET | Freq: Two times a day (BID) | ORAL | 0 refills | Status: DC

## 2024-04-09 MED ORDER — SENNA 8.6 MG PO TABS
1.0000 | ORAL_TABLET | Freq: Two times a day (BID) | ORAL | 0 refills | Status: DC
  Filled 2024-04-09: qty 28, 14d supply, fill #0

## 2024-04-09 MED ORDER — POLYETHYLENE GLYCOL 3350 17 GM/SCOOP PO POWD
17.0000 g | Freq: Every day | ORAL | 0 refills | Status: DC
  Filled 2024-04-09: qty 238, 14d supply, fill #0

## 2024-04-09 MED ORDER — ACETAMINOPHEN 500 MG PO TABS: 1000.0000 mg | ORAL_TABLET | Freq: Three times a day (TID) | ORAL | 0 refills | Status: DC

## 2024-04-09 MED ORDER — OXYCODONE HCL 5 MG PO TABS
5.0000 mg | ORAL_TABLET | ORAL | 0 refills | Status: DC | PRN
  Filled 2024-04-09: qty 30, 3d supply, fill #0

## 2024-04-09 MED ORDER — POLYETHYLENE GLYCOL 3350 17 G PO PACK: 17.0000 g | PACK | Freq: Every day | ORAL | 0 refills | Status: DC

## 2024-04-09 MED ORDER — ACETAMINOPHEN 500 MG PO TABS
1000.0000 mg | ORAL_TABLET | Freq: Three times a day (TID) | ORAL | 0 refills | Status: AC
  Filled 2024-04-09: qty 84, 14d supply, fill #0

## 2024-04-09 MED ORDER — OXYCODONE HCL 5 MG PO TABS: 5.0000 mg | ORAL_TABLET | ORAL | 0 refills | Status: DC | PRN

## 2024-04-09 MED ORDER — SENNA 8.6 MG PO TABS: 1.0000 | ORAL_TABLET | Freq: Two times a day (BID) | ORAL | 0 refills | Status: DC

## 2024-04-09 MED ORDER — SODIUM CHLORIDE 0.9% IV SOLUTION: Freq: Once | INTRAVENOUS | Status: DC

## 2024-04-09 NOTE — Progress Notes (Signed)
 Physical Therapy Treatment Patient Details Name: Jeffrey Stewart MRN: [id number] DOB: 07-16-1935 Today's Date: 04/09/2024   History of Present Illness Pt is an 89 y.o. male presenting 9/21 after fall at home. Found to have R intertrochanteric hip fx. S/p cephalomedullary nail with intramedullary rodding 9/22. PMH: HFrEF, CAD, RA, and HLD    PT Comments  Pt resting in bed on arrival, agreeable to session and demonstrating continued progress towards acute goals. Pt continues to be limited in safe mobility by decreased activity tolerance, poor balance/postural reactions and general weakness. Pt requiring mod A +2 to come to sitting EOB and min A +2 to boost to stand with RW support with increased time to extend bil knees. Pt able to progress ambulation for short in room distance with up to mod A to maintain balance and chair follow for safety as pt with noted R knee buckling in stance throughout. Patient will benefit from continued inpatient follow up therapy, <3 hours/day to address deficits and maximize functional independence and decrease caregiver burden. Pt continues to benefit from skilled PT services to progress toward functional mobility goals.     If plan is discharge home, recommend the following: Two people to help with walking and/or transfers;A lot of help with bathing/dressing/bathroom;Assistance with cooking/housework;Assist for transportation;Help with stairs or ramp for entrance   Can travel by private vehicle     No  Equipment Recommendations  None recommended by PT    Recommendations for Other Services       Precautions / Restrictions Precautions Precautions: Fall Recall of Precautions/Restrictions: Intact Restrictions Weight Bearing Restrictions Per Provider Order: Yes RLE Weight Bearing Per Provider Order: Weight bearing as tolerated     Mobility  Bed Mobility Overal bed mobility: Needs Assistance Bed Mobility: Supine to Sit     Supine to sit: Mod assist, +2 for  safety/equipment, +2 for physical assistance     General bed mobility comments: Multimodal cues for transition to EOB. Pt required assist to advance RLE, and elevate trunk to full sitting position.    Transfers Overall transfer level: Needs assistance Equipment used: Rolling walker (2 wheels), 2 person hand held assist Transfers: Sit to/from Stand, Bed to chair/wheelchair/BSC Sit to Stand: +2 physical assistance, Min assist           General transfer comment: min A +2 to boost to stand with increased time needed to fully extend knees    Ambulation/Gait Ambulation/Gait assistance: Mod assist, +2 safety/equipment (chair follow) Gait Distance (Feet): 8 Feet Assistive device: Rolling walker (2 wheels) Gait Pattern/deviations: Step-to pattern, Decreased stance time - right, Decreased stance time - left, Decreased stride length, Decreased dorsiflexion - right, Knee flexed in stance - right, Knees buckling, Antalgic Gait velocity: decr     General Gait Details: pt with heavy reliance on UE support on RW, cues for LE sequencing with R knee flexing/buckling in stance, distance limited by fatigue   Stairs             Wheelchair Mobility     Tilt Bed    Modified Rankin (Stroke Patients Only)       Balance Overall balance assessment: Needs assistance Sitting-balance support: No upper extremity supported, Feet supported   Sitting balance - Comments: poor progressing to fair; initially with heavy posterior lean   Standing balance support: Bilateral upper extremity supported, During functional activity, Reliant on assistive device for balance Standing balance-Leahy Scale: Poor Standing balance comment: reliant on device and external support  Communication Communication Communication: No apparent difficulties  Cognition Arousal: Alert Behavior During Therapy: WFL for tasks assessed/performed                              Following commands: Intact      Cueing Cueing Techniques: Verbal cues, Gestural cues, Tactile cues  Exercises Other Exercises Other Exercises: encouraged pt to perform 20x ankle pumps/hour, quad sets, and glute sets    General Comments General comments (skin integrity, edema, etc.): VSS. pt with DOE, but no noted SpO2 drop on RA      Pertinent Vitals/Pain Pain Assessment Pain Assessment: Faces Faces Pain Scale: Hurts little more Pain Location: hip Pain Descriptors / Indicators: Operative site guarding Pain Intervention(s): Monitored during session, Limited activity within patient's tolerance, Repositioned    Home Living                          Prior Function            PT Goals (current goals can now be found in the care plan section) Acute Rehab PT Goals Patient Stated Goal: Home at d/c PT Goal Formulation: With patient/family Time For Goal Achievement: 04/15/24 Progress towards PT goals: Progressing toward goals    Frequency    Min 3X/week      PT Plan      Co-evaluation              AM-PAC PT 6 Clicks Mobility   Outcome Measure  Help needed turning from your back to your side while in a flat bed without using bedrails?: Total Help needed moving from lying on your back to sitting on the side of a flat bed without using bedrails?: Total Help needed moving to and from a bed to a chair (including a wheelchair)?: A Lot Help needed standing up from a chair using your arms (e.g., wheelchair or bedside chair)?: A Lot Help needed to walk in hospital room?: Total Help needed climbing 3-5 steps with a railing? : Total 6 Click Score: 8    End of Session Equipment Utilized During Treatment: Gait belt Activity Tolerance: Patient limited by fatigue;Patient tolerated treatment well Patient left: in chair;with call bell/phone within reach;with family/visitor present Nurse Communication: Mobility status PT Visit Diagnosis: Unsteadiness on feet  (R26.81);Pain Pain - Right/Left: Right Pain - part of body: Hip     Time: 8943-8886 PT Time Calculation (min) (ACUTE ONLY): 17 min  Charges:    $Gait Training: 8-22 mins PT General Charges $$ ACUTE PT VISIT: 1 Visit                     Daine Gunther R. PTA Acute Rehabilitation Services Office: [phone]   Therisa CHRISTELLA Boor 04/09/2024, 12:19 PM

## 2024-04-09 NOTE — Progress Notes (Signed)
 Progress Note   Patient: Jeffrey Stewart FMW:[id number] DOB: 10-31-1934 DOA: 04/06/2024     3 DOS: the patient was seen and examined on 04/09/2024   Brief hospital course: HOMAR WEINKAUF is a 89 y.o. male with medical history significant of chronic HFrEF, CAD, and HLD who presented on 9/21 with a mechanical fall. He is found to have right intertrochanteric femur fracture admitted to [facility] service with orthopedic surgery consultation.  Assessment and Plan: Right intertrochanteric femur fracture S/p ORIF with intramedullary rodding. Orthopedics follow up appreciated. WBAT with PT/ OT. Continue pain control.  Aspirin  for DVT prophylaxis.  Acute blood loss anemia Due to ortho procedure- Hb 6.9 today, baseline around 12. 1 unit PRBC ordered. Monitor daily CBC. Transfuse for Hb<7.   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 Consider further imaging with MRI, recommend in the next few weeks post-hospitalization Outpatient ENT follow up suggested. Family aware and agreed.   Chronic combined CHF Appears compensated Continue torsemide /Kcl. Monitor daily weights, strict input and output.   CAD Continue ASA 81 mg daily   HLD Continue atorvastatin  Hold fish oil while hospitalized   HTN Continue carvedilol    ILD Not on home O2 Continue home dose prednisone ; mycophenolate  Continue Albuterol , Claritin , Duonebs. Pulmonary follow up outpatient.   Glaucoma Continue brimonidine , latanoprost .     Out of bed to chair. Incentive spirometry. Nursing supportive care. Fall, aspiration precautions. Diet:  Diet Orders (From admission, onward)     Start     Ordered   04/07/24 2133  Diet regular Fluid consistency: Thin  Diet effective now       Question:  Fluid consistency:  Answer:  Thin   04/07/24 2132           DVT prophylaxis: SCDs Start: 04/06/24 1904  Level of care: Med-Surg   Code Status: Full Code  Subjective: Patient is seen and examined today morning. He is  lying comfortably. Family at bedside. Currently on 2L o2 via [place]. Low Hb noted this AM.  Physical Exam: Vitals:   04/09/24 0745 04/09/24 0814 04/09/24 0915 04/09/24 1507  BP: (!) 110/51  (!) 103/47 (!) 104/52  Pulse: 91  83 90  Resp: 19  19 17   Temp: 98.2 F (36.8 C)  98.3 F (36.8 C) 97.6 F (36.4 C)  TempSrc:   Oral   SpO2:  100% 95% 96%  Weight:      Height:        General - Elderly Caucasian male, no apparent distress HEENT - PERRLA, EOMI, atraumatic head, non tender sinuses. Lung - Clear, basal rales, rhonchi, wheezes. Heart - S1, S2 heard, no murmurs, rubs, trace pedal edema. Abdomen - Soft, non tender, bowel sounds good Neuro - Alert, awake and oriented x 3, non focal exam. Skin - Warm and dry.  Data Reviewed:      Latest Ref Rng & Units 04/09/2024   11:27 AM 04/09/2024    4:37 AM 04/08/2024    4:48 AM  CBC  WBC 4.0 - 10.5 K/uL  12.3  11.9   Hemoglobin 13.0 - 17.0 g/dL 7.9  6.9  9.0   Hematocrit 39.0 - 52.0 % 23.3  20.4  27.1   Platelets 150 - 400 K/uL  106  116       Latest Ref Rng & Units 04/06/2024    4:58 PM 10/24/2023    1:02 PM 09/11/2023    5:17 AM  BMP  Glucose 70 - 99 mg/dL 889  893  818  BUN 8 - 23 mg/dL 28  24  50   Creatinine 0.61 - 1.24 mg/dL 8.96  9.02  8.98   Sodium 135 - 145 mmol/L 141  138  141   Potassium 3.5 - 5.1 mmol/L 4.1  4.1  3.9   Chloride 98 - 111 mmol/L 101  102  101   CO2 22 - 32 mmol/L 27  32  28   Calcium  8.9 - 10.3 mg/dL 8.9  8.9  8.7    DG HIP UNILAT WITH PELVIS 2-3 VIEWS RIGHT Result Date: 04/07/2024 CLINICAL DATA:  Postop internal fixation EXAM: DG HIP (WITH OR WITHOUT PELVIS) 2-3V RIGHT COMPARISON:  04/06/2024 FINDINGS: Changes of internal fixation across the right femoral intertrochanteric fracture. Anatomic alignment. No hardware complicating feature. IMPRESSION: Internal fixation.  No hardware complicating feature. Electronically Signed   By: Franky Crease M.D.   On: 04/07/2024 23:15   DG HIP UNILAT WITH PELVIS 2-3 VIEWS  RIGHT Result Date: 04/07/2024 CLINICAL DATA:  Right hip internal fixation. EXAM: DG HIP (WITH OR WITHOUT PELVIS) 2-3V RIGHT COMPARISON:  04/06/2024 FINDINGS: Multiple intraoperative spot images demonstrate internal fixation across the right femoral intertrochanteric fracture. No hardware complicating feature. IMPRESSION: Internal fixation with anatomic alignment.  No complicating feature. Electronically Signed   By: Franky Crease M.D.   On: 04/07/2024 23:07   DG C-Arm 1-60 Min-No Report Result Date: 04/07/2024 Fluoroscopy was utilized by the requesting physician.  No radiographic interpretation.    Family Communication: Discussed with patient, family at bedside, they understand and agree. All questions answered.  Disposition: Status is: Inpatient Remains inpatient appropriate because: orthopedic intervention.  Planned Discharge Destination: Home with Home Health     Time spent: 46 minutes  Author: Concepcion Riser, MD 04/09/2024 3:26 PM Secure chat 7am to 7pm For on call review [email or web address].

## 2024-04-09 NOTE — Care Management Important Message (Signed)
 Important Message  Patient Details  Name: Jeffrey Stewart MRN: [id number] Date of Birth: [birth year]   Important Message Given:  Yes - Medicare IM     Jon Cruel 04/09/2024, 3:44 PM

## 2024-04-10 DIAGNOSIS — E782 Mixed hyperlipidemia: Secondary | ICD-10-CM | POA: Diagnosis not present

## 2024-04-10 DIAGNOSIS — S72001A Fracture of unspecified part of neck of right femur, initial encounter for closed fracture: Secondary | ICD-10-CM | POA: Diagnosis not present

## 2024-04-10 DIAGNOSIS — S72141A Displaced intertrochanteric fracture of right femur, initial encounter for closed fracture: Secondary | ICD-10-CM | POA: Diagnosis not present

## 2024-04-10 DIAGNOSIS — I5042 Chronic combined systolic (congestive) and diastolic (congestive) heart failure: Secondary | ICD-10-CM | POA: Diagnosis not present

## 2024-04-10 LAB — CBC
HCT: 21.4 % — ABNORMAL LOW (ref 39.0–52.0)
Hemoglobin: 7.2 g/dL — ABNORMAL LOW (ref 13.0–17.0)
MCH: 35.3 pg — ABNORMAL HIGH (ref 26.0–34.0)
MCHC: 33.6 g/dL (ref 30.0–36.0)
MCV: 104.9 fL — ABNORMAL HIGH (ref 80.0–100.0)
Platelets: 104 K/uL — ABNORMAL LOW (ref 150–400)
RBC: 2.04 MIL/uL — ABNORMAL LOW (ref 4.22–5.81)
RDW: 18.7 % — ABNORMAL HIGH (ref 11.5–15.5)
WBC: 8.9 K/uL (ref 4.0–10.5)
nRBC: 0.4 % — ABNORMAL HIGH (ref 0.0–0.2)

## 2024-04-10 LAB — TYPE AND SCREEN
ABO/RH(D): O POS
Antibody Screen: NEGATIVE
Unit division: 0

## 2024-04-10 LAB — BPAM RBC
Blood Product Expiration Date: 202510212359
ISSUE DATE / TIME: 202509240617
Unit Type and Rh: 5100

## 2024-04-10 MED ORDER — CARVEDILOL 3.125 MG PO TABS
3.1250 mg | ORAL_TABLET | Freq: Two times a day (BID) | ORAL | Status: DC
  Administered 2024-04-11 (×2): 3.125 mg via ORAL
  Filled 2024-04-10 (×2): qty 1

## 2024-04-10 NOTE — Progress Notes (Signed)
 Orthopedic Surgery Progress Note   Assessment: Patient is a 89 y.o. male with right intertrochanteric femur fracture status post CMN   Plan: -Operative plans: complete -Dressings replaced this morning -Diet: regular -DVT ppx: aspirin  81mg  BID -Antibiotics: ancef  x2 post-op doses -Weight bearing status: as tolerated -PT/OT evaluate and treat -Pain control -Dispo: per primary  ___________________________________________________________________________  Subjective: No acute events overnight. Has been walking around the room. Has not done more walking than that. Feels pain in hip is tolerable when laying in bed. Is more painful when weight bearing.    Physical Exam:  General: no acute distress, appears stated age Neurologic: alert, answering questions appropriately, following commands Respiratory: unlabored breathing on nasal canula  MSK:   -Right lower extremity  Dressings over hip  saturated with blood but intact EHL/TA/GSC intact Plantarflexes and dorsiflexes toes Sensation intact to light touch in sural, saphenous, tibial, deep peroneal, and superficial peroneal nerve distributions Foot warm and well perfused   Yesterday's total administered Morphine  [place] Equivalents: 30   Patient name: Jeffrey Stewart Patient MRN: [id number] Date: 04/10/24

## 2024-04-10 NOTE — Progress Notes (Signed)
 Progress Note   Patient: Jeffrey Stewart FMW:[id number] DOB: [birth year] DOA: 04/06/2024     4 DOS: the patient was seen and examined on 04/10/2024   Brief hospital course: Jeffrey Stewart is a 89 y.o. male with medical history significant of chronic HFrEF, CAD, and HLD who presented on 9/21 with a mechanical fall. He is found to have right intertrochanteric femur fracture admitted to [facility] service with orthopedic surgery consultation.  Assessment and Plan: Right intertrochanteric femur fracture S/p ORIF with intramedullary rodding. Orthopedics follow up appreciated. WBAT with PT/ OT. Continue pain control.  Aspirin  for DVT prophylaxis. PT/ OT recommended SNF placement.  Acute blood loss anemia Post procedural.  Hb 7.2 s/p 1 unit PRBC transfusion. Monitor daily CBC. Transfuse for Hb<7.   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 Consider further imaging with MRI, recommend in the next few weeks post-hospitalization Outpatient ENT follow up suggested. Family aware and agreed.   Chronic combined CHF Appears compensated Continue torsemide /Kcl. Monitor daily weights, strict input and output.   CAD Continue ASA, statin.   HLD Continue atorvastatin  Hold fish oil while hospitalized   HTN Continue carvedilol    ILD Not on home O2 Continue home dose prednisone ; mycophenolate  Continue Albuterol , Claritin , Duonebs. Pulmonary follow up outpatient.   Glaucoma Continue brimonidine , latanoprost .     Out of bed to chair. Incentive spirometry. Nursing supportive care. Fall, aspiration precautions. Diet:  Diet Orders (From admission, onward)     Start     Ordered   04/07/24 2133  Diet regular Fluid consistency: Thin  Diet effective now       Question:  Fluid consistency:  Answer:  Thin   04/07/24 2132           DVT prophylaxis: SCDs Start: 04/06/24 1904  Level of care: Med-Surg   Code Status: Full Code  Subjective: Patient is seen and examined today morning.  He is lying comfortably. Has pain while working with PT, getting out of bed. No family at bedside. Currently on 2L o2 via [place].  Physical Exam: Vitals:   04/10/24 0805 04/10/24 0835 04/10/24 1309 04/10/24 1515  BP: (!) 120/53  (!) 120/51 (!) 104/55  Pulse: 87  96 88  Resp: 19  16 16   Temp: 98.2 F (36.8 C)  97.9 F (36.6 C) 97.8 F (36.6 C)  TempSrc:      SpO2: 97% 96% 99% 96%  Weight:      Height:        General - Elderly Caucasian male, no apparent distress HEENT - PERRLA, EOMI, atraumatic head, non tender sinuses. Lung - Clear, basal rales, rhonchi, wheezes. Heart - S1, S2 heard, no murmurs, rubs, trace pedal edema. Abdomen - Soft, non tender, bowel sounds good Neuro - Alert, awake and oriented x 3, non focal exam. Skin - Warm and dry. Right hip dressing noted.  Data Reviewed:      Latest Ref Rng & Units 04/10/2024    5:19 AM 04/09/2024   11:27 AM 04/09/2024    4:37 AM  CBC  WBC 4.0 - 10.5 K/uL 8.9   12.3   Hemoglobin 13.0 - 17.0 g/dL 7.2  7.9  6.9   Hematocrit 39.0 - 52.0 % 21.4  23.3  20.4   Platelets 150 - 400 K/uL 104   106       Latest Ref Rng & Units 04/06/2024    4:58 PM 10/24/2023    1:02 PM 09/11/2023    5:17 AM  BMP  Glucose  70 - 99 mg/dL 889  893  818   BUN 8 - 23 mg/dL 28  24  50   Creatinine 0.61 - 1.24 mg/dL 8.96  9.02  8.98   Sodium 135 - 145 mmol/L 141  138  141   Potassium 3.5 - 5.1 mmol/L 4.1  4.1  3.9   Chloride 98 - 111 mmol/L 101  102  101   CO2 22 - 32 mmol/L 27  32  28   Calcium  8.9 - 10.3 mg/dL 8.9  8.9  8.7    No results found.   Family Communication: Discussed with patient, he understand and agree. All questions answered.  Disposition: Status is: Inpatient Remains inpatient appropriate because: s/p orthopedic intervention, monitor Hb, pain control.  Planned Discharge Destination: Skilled nursing facility     Time spent: 43 minutes  Author: Concepcion Riser, MD 04/10/2024 5:01 PM Secure chat 7am to 7pm For on call review  [email or web address].

## 2024-04-10 NOTE — Progress Notes (Signed)
 Occupational Therapy Treatment Patient Details Name: Jeffrey Stewart MRN: [id number] DOB: 08-31-1934 Today's Date: 04/10/2024   History of present illness Pt is an 89 y.o. male presenting 9/21 after fall at home. Found to have R intertrochanteric hip fx. S/p cephalomedullary nail with intramedullary rodding 9/22. PMH: HFrEF, CAD, RA, and HLD   OT comments  Pt making slow but steady progress with adls and adl mobility. Pt continues to require mod assist with most LE adls and has not walked more than a few steps. Pt has wife and daughter to assist at home and they are getting an estimate to have a ramp built.  House has 5 steps to enter and pt does not want to go to SNF at d/c.  Feel family could help him at home IF they can get him in the home.  Unsure when ramp is to be finished. Feel pt would benefit from less than three hours of therapy a day but if pt refuses, rec HHOT and HHAid to start. Will continue to see with focus on mobility and adls.       If plan is discharge home, recommend the following:  Two people to help with walking and/or transfers;A lot of help with bathing/dressing/bathroom;Two people to help with bathing/dressing/bathroom;Assistance with cooking/housework;Assist for transportation;Help with stairs or ramp for entrance   Equipment Recommendations  BSC/3in1;Tub/shower bench    Recommendations for Other Services      Precautions / Restrictions Precautions Precautions: Fall Recall of Precautions/Restrictions: Intact Restrictions Weight Bearing Restrictions Per Provider Order: Yes RLE Weight Bearing Per Provider Order: Weight bearing as tolerated       Mobility Bed Mobility Overal bed mobility: Needs Assistance Bed Mobility: Sit to Supine     Supine to sit: Mod assist     General bed mobility comments: assist with getting R LE into bed    Transfers Overall transfer level: Needs assistance Equipment used: Rolling walker (2 wheels), 2 person hand held  assist Transfers: Sit to/from Stand, Bed to chair/wheelchair/BSC Sit to Stand: Min assist, From elevated surface Stand pivot transfers: Mod assist         General transfer comment: Cues for hand placement.     Balance Overall balance assessment: Needs assistance Sitting-balance support: No upper extremity supported, Feet supported Sitting balance-Leahy Scale: Good     Standing balance support: Bilateral upper extremity supported, During functional activity, Reliant on assistive device for balance Standing balance-Leahy Scale: Poor Standing balance comment: reliant on device and external support                           ADL either performed or assessed with clinical judgement   ADL Overall ADL's : Needs assistance/impaired Eating/Feeding: Independent;Sitting   Grooming: Wash/dry hands;Wash/dry face;Oral care;Set up;Sitting   Upper Body Bathing: Set up;Sitting   Lower Body Bathing: Moderate assistance;+2 for safety/equipment;+2 for physical assistance;Sit to/from stand   Upper Body Dressing : Set up;Sitting   Lower Body Dressing: Maximal assistance;+2 for safety/equipment;+2 for physical assistance;Sit to/from stand   Toilet Transfer: Moderate assistance;+2 for safety/equipment;+2 for physical assistance;Stand-pivot Toilet Transfer Details (indicate cue type and reason): used walker. Encouraged pt to pick feet up insteady of shuffling feet across floor Toileting- Clothing Manipulation and Hygiene: Moderate assistance;Sit to/from stand;Cueing for compensatory techniques       Functional mobility during ADLs: Moderate assistance;+2 for physical assistance;+2 for safety/equipment General ADL Comments: Pt limited most with LE adls and adl transfers due to pain  Extremity/Trunk Assessment Upper Extremity Assessment Upper Extremity Assessment: Overall WFL for tasks assessed   Lower Extremity Assessment Lower Extremity Assessment: Defer to PT evaluation         Vision   Vision Assessment?: No apparent visual deficits   Perception     Praxis     Communication Communication Communication: No apparent difficulties   Cognition Arousal: Alert Behavior During Therapy: WFL for tasks assessed/performed Cognition: No apparent impairments                               Following commands: Intact        Cueing   Cueing Techniques: Verbal cues, Gestural cues, Tactile cues  Exercises      Shoulder Instructions       General Comments Pt limited by pain and SOB.  Took O2 off with O2 sats staying 91% and above with SOB    Pertinent Vitals/ Pain       Pain Assessment Pain Assessment: Faces Faces Pain Scale: Hurts little more Pain Location: R laterl LE Pain Descriptors / Indicators: Operative site guarding Pain Intervention(s): Limited activity within patient's tolerance, Monitored during session, Repositioned  Home Living                                          Prior Functioning/Environment              Frequency  Min 2X/week        Progress Toward Goals  OT Goals(current goals can now be found in the care plan section)  Progress towards OT goals: Progressing toward goals  Acute Rehab OT Goals Patient Stated Goal: to go home OT Goal Formulation: With patient Time For Goal Achievement: 04/22/24 Potential to Achieve Goals: Good ADL Goals Pt Will Perform Grooming: with contact guard assist;standing Pt Will Perform Lower Body Dressing: with min assist;sit to/from stand Pt Will Transfer to Toilet: with min assist;ambulating  Plan      Co-evaluation                 AM-PAC OT 6 Clicks Daily Activity     Outcome Measure   Help from another person eating meals?: None Help from another person taking care of personal grooming?: A Little Help from another person toileting, which includes using toliet, bedpan, or urinal?: A Lot Help from another person bathing (including washing,  rinsing, drying)?: A Lot Help from another person to put on and taking off regular upper body clothing?: A Little Help from another person to put on and taking off regular lower body clothing?: A Lot 6 Click Score: 16    End of Session Equipment Utilized During Treatment: Gait belt;Rolling walker (2 wheels)  OT Visit Diagnosis: Unsteadiness on feet (R26.81);Muscle weakness (generalized) (M62.81);History of falling (Z91.81);Pain Pain - Right/Left: Right Pain - part of body: Hip   Activity Tolerance Patient tolerated treatment well   Patient Left in bed;with call bell/phone within reach;with bed alarm set;with family/visitor present   Nurse Communication Mobility status        Time: 1351-1419 OT Time Calculation (min): 28 min  Charges: OT General Charges $OT Visit: 1 Visit OT Treatments $Self Care/Home Management : 23-37 mins   Joshua Silvano Dragon 04/10/2024, 2:32 PM

## 2024-04-10 NOTE — Progress Notes (Signed)
 Physical Therapy Treatment Patient Details Name: Jeffrey Stewart MRN: [id number] DOB: 1934-11-14 Today's Date: 04/10/2024   History of Present Illness Pt is an 89 y.o. male presenting 9/21 after fall at home. Found to have R intertrochanteric hip fx. S/p cephalomedullary nail with intramedullary rodding 9/22. PMH: HFrEF, CAD, RA, and HLD    PT Comments  Pt resting in bed on arrival, pleasant and agreeable to session with slow but steady progress towards acute goals as pt continues to be limited by decreased activity tolerance, weakness and poor balance/postural reactions. Pt requiring mod A to come to sitting EOB, although pt stating that he normally sleeps in his lift chair. Pt able to rise with min A from slightly elevate EOB and low recliner with light cues for hand placement and increased time to rise. Pt limited in gait progression this session, with pt ambulating ~6'  with min A to maintain balance and RW for support a pt quick to fatigue and with increased RLE pain with weight bearing. Pt up in chair at end of session and performing seated exercise for increased ROM and strength. Pt continues to benefit from skilled PT services to progress toward functional mobility goals.     If plan is discharge home, recommend the following: Two people to help with walking and/or transfers;A lot of help with bathing/dressing/bathroom;Assistance with cooking/housework;Assist for transportation;Help with stairs or ramp for entrance   Can travel by private vehicle     No  Equipment Recommendations  None recommended by PT    Recommendations for Other Services       Precautions / Restrictions Precautions Precautions: Fall Recall of Precautions/Restrictions: Intact Restrictions Weight Bearing Restrictions Per Provider Order: Yes RLE Weight Bearing Per Provider Order: Weight bearing as tolerated     Mobility  Bed Mobility Overal bed mobility: Needs Assistance Bed Mobility: Supine to Sit      Supine to sit: Mod assist     General bed mobility comments: Multimodal cues for transition to EOB. Pt required assist to advance RLE, and elevate trunk to full sitting position.    Transfers Overall transfer level: Needs assistance Equipment used: Rolling walker (2 wheels), 2 person hand held assist Transfers: Sit to/from Stand, Bed to chair/wheelchair/BSC Sit to Stand: Min assist, From elevated surface   Step pivot transfers: Min assist       General transfer comment: min A to boost to stand from elevate EOB and low recliner, cues for hand placement, min A to steady and manage RW during step pivot    Ambulation/Gait Ambulation/Gait assistance: Mod assist (chair follow) Gait Distance (Feet): 6 Feet Assistive device: Rolling walker (2 wheels) Gait Pattern/deviations: Step-to pattern, Decreased stance time - right, Decreased stance time - left, Decreased stride length, Decreased dorsiflexion - right, Knee flexed in stance - right, Knees buckling, Antalgic Gait velocity: decr     General Gait Details: pt with heavy reliance on UE support on RW, cues for LE sequencing and muscle enagement with R knee flexing/buckling in stance, distance limited by fatigue with pt needing to sit   Stairs             Wheelchair Mobility     Tilt Bed    Modified Rankin (Stroke Patients Only)       Balance Overall balance assessment: Needs assistance Sitting-balance support: No upper extremity supported, Feet supported   Sitting balance - Comments: poor progressing to fair; initially with heavy posterior lean   Standing balance support: Bilateral upper extremity supported,  During functional activity, Reliant on assistive device for balance Standing balance-Leahy Scale: Poor Standing balance comment: reliant on device and external support                            Communication Communication Communication: No apparent difficulties  Cognition Arousal: Alert Behavior  During Therapy: WFL for tasks assessed/performed                             Following commands: Intact      Cueing Cueing Techniques: Verbal cues, Gestural cues, Tactile cues  Exercises General Exercises - Lower Extremity Ankle Circles/Pumps: AROM, Right, 10 reps Quad Sets: AROM, Right, 10 reps Gluteal Sets: AROM, Both, 5 reps Long Arc Quad: AAROM, Right, 10 reps, Seated Hip Flexion/Marching: AAROM, Right, 5 reps, Seated    General Comments General comments (skin integrity, edema, etc.): VSS, pt with noted DOE but no drop in SpO2      Pertinent Vitals/Pain Pain Assessment Pain Assessment: Faces Faces Pain Scale: Hurts little more Pain Location: R laterl LE Pain Descriptors / Indicators: Operative site guarding Pain Intervention(s): Monitored during session, Limited activity within patient's tolerance, Repositioned    Home Living                          Prior Function            PT Goals (current goals can now be found in the care plan section) Acute Rehab PT Goals Patient Stated Goal: Home at d/c PT Goal Formulation: With patient/family Time For Goal Achievement: 04/15/24 Progress towards PT goals: Progressing toward goals (slowly)    Frequency    Min 3X/week      PT Plan      Co-evaluation              AM-PAC PT 6 Clicks Mobility   Outcome Measure  Help needed turning from your back to your side while in a flat bed without using bedrails?: Total Help needed moving from lying on your back to sitting on the side of a flat bed without using bedrails?: Total Help needed moving to and from a bed to a chair (including a wheelchair)?: A Lot Help needed standing up from a chair using your arms (e.g., wheelchair or bedside chair)?: A Lot Help needed to walk in hospital room?: Total Help needed climbing 3-5 steps with a railing? : Total 6 Click Score: 8    End of Session Equipment Utilized During Treatment: Gait belt Activity  Tolerance: Patient limited by fatigue;Patient tolerated treatment well Patient left: in chair;with call bell/phone within reach Nurse Communication: Mobility status PT Visit Diagnosis: Unsteadiness on feet (R26.81);Pain Pain - Right/Left: Right Pain - part of body: Hip     Time: 1012-1030 PT Time Calculation (min) (ACUTE ONLY): 18 min  Charges:    $Gait Training: 8-22 mins PT General Charges $$ ACUTE PT VISIT: 1 Visit                     Suren Payne R. PTA Acute Rehabilitation Services Office: [phone]   Therisa CHRISTELLA Boor 04/10/2024, 1:17 PM

## 2024-04-11 LAB — CBC
HCT: 21.1 % — ABNORMAL LOW (ref 39.0–52.0)
Hemoglobin: 7.1 g/dL — ABNORMAL LOW (ref 13.0–17.0)
MCH: 35.7 pg — ABNORMAL HIGH (ref 26.0–34.0)
MCHC: 33.6 g/dL (ref 30.0–36.0)
MCV: 106 fL — ABNORMAL HIGH (ref 80.0–100.0)
Platelets: 109 K/uL — ABNORMAL LOW (ref 150–400)
RBC: 1.99 MIL/uL — ABNORMAL LOW (ref 4.22–5.81)
RDW: 18.7 % — ABNORMAL HIGH (ref 11.5–15.5)
WBC: 8.4 K/uL (ref 4.0–10.5)
nRBC: 0.4 % — ABNORMAL HIGH (ref 0.0–0.2)

## 2024-04-11 LAB — BASIC METABOLIC PANEL WITH GFR
Anion gap: 6 (ref 5–15)
BUN: 48 mg/dL — ABNORMAL HIGH (ref 8–23)
CO2: 28 mmol/L (ref 22–32)
Calcium: 8.2 mg/dL — ABNORMAL LOW (ref 8.9–10.3)
Chloride: 103 mmol/L (ref 98–111)
Creatinine, Ser: 1.23 mg/dL (ref 0.61–1.24)
GFR, Estimated: 57 mL/min — ABNORMAL LOW (ref >60)
Glucose, Bld: 125 mg/dL — ABNORMAL HIGH (ref 70–99)
Potassium: 4.7 mmol/L (ref 3.5–5.1)
Sodium: 137 mmol/L (ref 135–145)

## 2024-04-11 MED ORDER — ENSURE PLUS HIGH PROTEIN PO LIQD
237.0000 mL | Freq: Two times a day (BID) | ORAL | Status: DC
  Administered 2024-04-11 – 2024-04-12 (×3): 237 mL via ORAL

## 2024-04-11 NOTE — Progress Notes (Signed)
 Nutrition Follow Up  DOCUMENTATION CODES:   Non-severe (moderate) malnutrition in context of social or environmental circumstances (aging, decreased appetite)  INTERVENTION:  Continue regular diet to promote adequate intake of calories and protein  Ensure Plus High Protein po BID, each supplement provides 350 kcal and 20 grams of protein.  Encouraged adequate intake of meals and supplements to meet increased calorie and protein needs for post op recovery   Monitor for need to add additions to bowel regimen if constipation continues  NUTRITION DIAGNOSIS:   Moderate Malnutrition related to social / environmental circumstances (aging, decreased appetite) as evidenced by mild fat depletion, mild muscle depletion, moderate muscle depletion. Remains applicable  GOAL:   Patient will meet greater than or equal to 90% of their needs Progressing  MONITOR:   PO intake, Supplement acceptance, Diet advancement  REASON FOR ASSESSMENT:   Consult Assessment of nutrition requirement/status, Hip fracture protocol  ASSESSMENT:   Pt with hx heart failure, CAD, arthritis and HLD. Admitted after fall, diagnosed R intertrochanteric fx.  9/21 admitted 9/22 ORIF w/ IM of R hip fx  Spoke with pt who was working with therapy at time of assessment. Pt reports good appetite, states he feels like his appetite is normal for what he was eating prior to admission. Per diet summary documentation, pt consuming on average 50% of last 8 meals. Pt states the portions here are big and he can't always finish what he orders. Pt endorses drinking Glucerna shakes but states he prefers Ensure. Pt reports he has never had an adverse reaction to Ensure and would drink them at home. Pt reports some issues with constipation since surgery but has been taking a laxative and feels like it may be helping, still has not had bowel movement since 9/21. Encouraged continued adequate intake of meals and supplements to meet needs.  Encouraged continued participation in therapy to help with recovery.  Medications reviewed and include:  Miralax   Potassium chloride  20 mEq Senna    Labs reviewed   Diet Order:   Diet Order             Diet regular Fluid consistency: Thin  Diet effective now                   EDUCATION NEEDS:   Education needs have been addressed  Skin:  Skin Assessment: Reviewed RN Assessment  Last BM:  9/21  Height:   Ht Readings from Last 1 Encounters:  04/07/24 6' (1.829 m)    Weight:   Wt Readings from Last 1 Encounters:  04/07/24 88.5 kg    Ideal Body Weight:  80.9 kg  BMI:  Body mass index is 26.45 kg/m.  Estimated Nutritional Needs:   Kcal:  2000-2200  Protein:  95-110g  Fluid:  >/= 2L    Josette Glance, MS, RDN, LDN Clinical Dietitian I Please reach out via secure chat

## 2024-04-11 NOTE — TOC Progression Note (Signed)
 Transition of Care [facility]) - Progression Note    Patient Details  Name: Jeffrey Stewart MRN: [id number] Date of Birth: 06-18-1935  [facility] CM/SW Contact  Rosalva Jon Bloch, RN Phone Number: 04/11/2024, 2:38 PM  Clinical Narrative:    Pt agreeable to home health services. Declined SNF placement. Preference for home health services : [facility]. Referral made with [facility] and accepted pending MD order face to face. Referral made with [employer] for DME : bsc. Equipment will be delivered to bedside prior to discharge.  IP CM will continue to monitor and assist with needs...   Expected Discharge Plan: Home w Home Health Services Barriers to Discharge: Continued Medical Work up               Expected Discharge Plan and Services In-house Referral: Clinical Social Work Discharge Planning Services: CM Consult Post Acute Care Choice: Home Health Living arrangements for the past 2 months: Single Family Home                 DME Arranged: Bedside commode DME Agency: [employer] Date DME Agency Contacted: 04/11/24 Time DME Agency Contacted: [phone] Representative spoke with at DME Agency: Darlyn HH Arranged: PT, OT, NA HH Agency: [facility] Date [facility] Agency Contacted: 04/11/24 Time HH Agency Contacted: 1437 Representative spoke with at [facility] Agency: Arna   Social Drivers of Health (SDOH) Interventions SDOH Screenings   Food Insecurity: No Food Insecurity (04/07/2024)  Housing: Low Risk  (04/07/2024)  Transportation Needs: No Transportation Needs (04/07/2024)  Utilities: Not At Risk (04/07/2024)  Social Connections: Moderately Integrated (04/07/2024)  Tobacco Use: Low Risk  (04/07/2024)    Readmission Risk Interventions    09/10/2023   12:52 PM  Readmission Risk Prevention Plan  Transportation Screening Complete  PCP or Specialist Appt within 5-7 Days Complete  Home Care Screening Complete  Medication Review (RN CM) Complete

## 2024-04-11 NOTE — Progress Notes (Signed)
 Progress Note   Patient: Jeffrey Stewart FMW:[id number] DOB: 06/03/1935 DOA: 04/06/2024     5 DOS: the patient was seen and examined on 04/11/2024   Brief hospital course: MANNY VITOLO is a 89 y.o. male with medical history significant of chronic HFrEF, CAD, and HLD who presented on 9/21 with a mechanical fall. He is found to have right intertrochanteric femur fracture admitted to [facility] service with orthopedic surgery consultation.  Assessment and Plan: Right intertrochanteric femur fracture S/p ORIF with intramedullary rodding. Orthopedics follow up appreciated. WBAT with PT/ OT. Continue pain control.  Aspirin  for DVT prophylaxis. PT/ OT recommended SNF placement. Patient refused SNF wishes to go home with [facility] PT/ OT  Acute blood loss anemia Post procedural.  Hb 7.1 s/p 1 unit PRBC transfusion this admission. Monitor daily CBC. Transfuse for Hb<7.   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 Consider further imaging with MRI, recommend in the next few weeks post-hospitalization Outpatient ENT follow up suggested. Family aware and agreed.   Chronic combined CHF Appears compensated Continue torsemide /Kcl. Monitor daily weights, strict input and output.   CAD Continue ASA, statin.   HLD Continue atorvastatin  Hold fish oil while hospitalized   HTN Continue carvedilol    ILD Not on home O2 Continue home dose prednisone ; mycophenolate  Continue Albuterol , Claritin , Duonebs. Pulmonary follow up outpatient.   Glaucoma Continue brimonidine , latanoprost .     Out of bed to chair. Incentive spirometry. Nursing supportive care. Fall, aspiration precautions. Diet:  Diet Orders (From admission, onward)     Start     Ordered   04/07/24 2133  Diet regular Fluid consistency: Thin  Diet effective now       Question:  Fluid consistency:  Answer:  Thin   04/07/24 2132           DVT prophylaxis: SCDs Start: 04/06/24 1904  Level of care: Med-Surg   Code Status:  Full Code  Subjective: Patient is seen and examined today morning. He is weak requiring 2 people assistance. Currently on 2L o2 via [place]. Wishes to go home with [facility].  Physical Exam: Vitals:   04/11/24 0717 04/11/24 0807 04/11/24 0817 04/11/24 1454  BP: (!) 119/56  (!) 102/50 (!) 122/55  Pulse: 95  (!) 104 96  Resp: 16  16 16   Temp: 97.9 F (36.6 C)  98.3 F (36.8 C) 98.5 F (36.9 C)  TempSrc:      SpO2: 99% 100% 99% 100%  Weight:      Height:        General - Elderly Caucasian male, no apparent distress HEENT - PERRLA, EOMI, atraumatic head, non tender sinuses. Lung - Clear, basal rales, rhonchi, wheezes. Heart - S1, S2 heard, no murmurs, rubs, trace pedal edema. Abdomen - Soft, non tender, bowel sounds good Neuro - Alert, awake and oriented x 3, non focal exam. Skin - Warm and dry. Right hip dressing noted.  Data Reviewed:      Latest Ref Rng & Units 04/11/2024    4:06 AM 04/10/2024    5:19 AM 04/09/2024   11:27 AM  CBC  WBC 4.0 - 10.5 K/uL 8.4  8.9    Hemoglobin 13.0 - 17.0 g/dL 7.1  7.2  7.9   Hematocrit 39.0 - 52.0 % 21.1  21.4  23.3   Platelets 150 - 400 K/uL 109  104        Latest Ref Rng & Units 04/11/2024    4:06 AM 04/06/2024    4:58 PM 10/24/2023  1:02 PM  BMP  Glucose 70 - 99 mg/dL 874  889  893   BUN 8 - 23 mg/dL 48  28  24   Creatinine 0.61 - 1.24 mg/dL 8.76  8.96  9.02   Sodium 135 - 145 mmol/L 137  141  138   Potassium 3.5 - 5.1 mmol/L 4.7  4.1  4.1   Chloride 98 - 111 mmol/L 103  101  102   CO2 22 - 32 mmol/L 28  27  32   Calcium  8.9 - 10.3 mg/dL 8.2  8.9  8.9    No results found.   Family Communication: Discussed with patient, wife over phone. They understand and agree. All questions answered.  Disposition: Status is: Inpatient Remains inpatient appropriate because: s/p orthopedic intervention, monitor Hb, pain control.  Planned Discharge Destination: Home with Home Health     Time spent: 44 minutes  Author: Concepcion Riser,  MD 04/11/2024 3:00 PM Secure chat 7am to 7pm For on call review [email or web address].

## 2024-04-11 NOTE — TOC Progression Note (Signed)
 Transition of Care [facility]) - Progression Note    Patient Details  Name: Jeffrey Stewart MRN: [id number] Date of Birth: 1935-01-24  [facility]) CM/SW Contact  Bridget Cordella Simmonds, LCSW Phone Number: 04/11/2024, 2:18 PM  Clinical Narrative:   CSW spoke with pt wife Maceo, who confirmed again that they do plan for pt to DC home with [facility].    Expected Discharge Plan: Home w Home Health Services Barriers to Discharge: Continued Medical Work up               Expected Discharge Plan and Services In-house Referral: Clinical Social Work   Post Acute Care Choice: Home Health Living arrangements for the past 2 months: Single Family Home                                       Social Drivers of Health (SDOH) Interventions SDOH Screenings   Food Insecurity: No Food Insecurity (04/07/2024)  Housing: Low Risk  (04/07/2024)  Transportation Needs: No Transportation Needs (04/07/2024)  Utilities: Not At Risk (04/07/2024)  Social Connections: Moderately Integrated (04/07/2024)  Tobacco Use: Low Risk  (04/07/2024)    Readmission Risk Interventions    09/10/2023   12:52 PM  Readmission Risk Prevention Plan  Transportation Screening Complete  PCP or Specialist Appt within 5-7 Days Complete  Home Care Screening Complete  Medication Review (RN CM) Complete

## 2024-04-11 NOTE — Progress Notes (Signed)
    Durable Medical Equipment  (From admission, onward)           Start     Ordered   04/11/24 1445  For home use only DME Bedside commode  Once       Comments: Confine to one room  Question:  Patient needs a bedside commode to treat with the following condition  Answer:  Gait instability   04/11/24 1445

## 2024-04-11 NOTE — Progress Notes (Signed)
 Physical Therapy Treatment Patient Details Name: Jeffrey Stewart MRN: [id number] DOB: 1935-06-04 Today's Date: 04/11/2024   History of Present Illness Pt is an 89 y.o. male presenting 9/21 after fall at home. Found to have R intertrochanteric hip fx. S/p cephalomedullary nail with intramedullary rodding 9/22. PMH: HFrEF, CAD, RA, and HLD    PT Comments  Pt resting in bed on arrival, eager for OOB with urinary urgency. Pt needing mod A to come to sitting EOB with improved sequencing. Pt standing from slightly elevated EOB and BSC with CGA for safety with good recall for hand placement. Pt continues to be limited in gait progression by fatigue and pain, limiting weight bearing and standing tolerance. Pt unable to progress stair training this session. Pt family present and stating they are trying to have a ramp built prior to pt returning home but may not be done in time, discussed PTAR transport home as safe option with family in agreement. Patient will benefit from continued inpatient follow up therapy, <3 hours/day, however if pt declines will benefit from HHPT. Pt continues to benefit from skilled PT services to progress toward functional mobility goals.     If plan is discharge home, recommend the following: Two people to help with walking and/or transfers;A lot of help with bathing/dressing/bathroom;Assistance with cooking/housework;Assist for transportation;Help with stairs or ramp for entrance   Can travel by private vehicle     No  Equipment Recommendations  None recommended by PT    Recommendations for Other Services       Precautions / Restrictions Precautions Precautions: Fall Recall of Precautions/Restrictions: Intact Restrictions Weight Bearing Restrictions Per Provider Order: Yes RLE Weight Bearing Per Provider Order: Weight bearing as tolerated     Mobility  Bed Mobility Overal bed mobility: Needs Assistance Bed Mobility: Supine to Sit     Supine to sit: Mod assist      General bed mobility comments: assist for all aspects as pt does not sleep in bed at baseline    Transfers Overall transfer level: Needs assistance Equipment used: Rolling walker (2 wheels), 2 person hand held assist Transfers: Sit to/from Stand, Bed to chair/wheelchair/BSC Sit to Stand: From elevated surface, Contact guard assist   Step pivot transfers: Contact guard assist       General transfer comment: CGA from elevated EOB and BSC    Ambulation/Gait Ambulation/Gait assistance: Min assist Gait Distance (Feet): 5 Feet Assistive device: Rolling walker (2 wheels) Gait Pattern/deviations: Step-to pattern, Decreased stance time - right, Decreased stance time - left, Decreased stride length, Decreased dorsiflexion - right, Knee flexed in stance - right, Knees buckling, Antalgic Gait velocity: decr     General Gait Details: pt with heavy reliance on UE support on RW, cues for LE sequencing and muscle enagement in stance, distance limited by fatigue with pt needing to sit and declining further trials   Stairs             Wheelchair Mobility     Tilt Bed    Modified Rankin (Stroke Patients Only)       Balance Overall balance assessment: Needs assistance Sitting-balance support: No upper extremity supported, Feet supported Sitting balance-Leahy Scale: Good Sitting balance - Comments: poor progressing to fair; initially with heavy posterior lean   Standing balance support: Bilateral upper extremity supported, During functional activity, Reliant on assistive device for balance Standing balance-Leahy Scale: Poor Standing balance comment: reliant on device and external support  Communication Communication Communication: No apparent difficulties  Cognition Arousal: Alert Behavior During Therapy: WFL for tasks assessed/performed                             Following commands: Intact      Cueing Cueing  Techniques: Verbal cues, Gestural cues, Tactile cues  Exercises General Exercises - Lower Extremity Ankle Circles/Pumps: AROM, Right, 10 reps Quad Sets: AROM, Right, 10 reps Long Arc Quad: AAROM, Right, 10 reps, Seated Heel Slides: AAROM, Right, 5 reps Other Exercises Other Exercises: HEP issused and reviewed with pt and fmaily    General Comments        Pertinent Vitals/Pain Pain Assessment Pain Assessment: Faces Faces Pain Scale: Hurts even more Pain Location: RLE Pain Descriptors / Indicators: Operative site guarding Pain Intervention(s): Monitored during session, Limited activity within patient's tolerance, Repositioned    Home Living                          Prior Function            PT Goals (current goals can now be found in the care plan section) Acute Rehab PT Goals Patient Stated Goal: Home at d/c PT Goal Formulation: With patient/family Time For Goal Achievement: 04/15/24 Progress towards PT goals: Progressing toward goals    Frequency    Min 3X/week      PT Plan      Co-evaluation              AM-PAC PT 6 Clicks Mobility   Outcome Measure  Help needed turning from your back to your side while in a flat bed without using bedrails?: Total Help needed moving from lying on your back to sitting on the side of a flat bed without using bedrails?: Total Help needed moving to and from a bed to a chair (including a wheelchair)?: A Lot Help needed standing up from a chair using your arms (e.g., wheelchair or bedside chair)?: A Lot Help needed to walk in hospital room?: Total Help needed climbing 3-5 steps with a railing? : Total 6 Click Score: 8    End of Session Equipment Utilized During Treatment: Gait belt Activity Tolerance: Patient limited by fatigue;Patient tolerated treatment well Patient left: in chair;with call bell/phone within reach;with family/visitor present Nurse Communication: Mobility status PT Visit Diagnosis:  Unsteadiness on feet (R26.81);Pain Pain - Right/Left: Right Pain - part of body: Hip     Time: 8952-8884 PT Time Calculation (min) (ACUTE ONLY): 28 min  Charges:    $Gait Training: 8-22 mins $Therapeutic Activity: 8-22 mins PT General Charges $$ ACUTE PT VISIT: 1 Visit                     Christophr Calix R. PTA Acute Rehabilitation Services Office: [phone]   Therisa CHRISTELLA Boor 04/11/2024, 1:08 PM

## 2024-04-11 NOTE — Plan of Care (Signed)
   Problem: Education: Goal: Knowledge of General Education information will improve Description Including pain rating scale, medication(s)/side effects and non-pharmacologic comfort measures Outcome: Progressing   Problem: Health Behavior/Discharge Planning: Goal: Ability to manage health-related needs will improve Outcome: Progressing

## 2024-04-12 ENCOUNTER — Other Ambulatory Visit (HOSPITAL_COMMUNITY): Payer: Self-pay

## 2024-04-12 DIAGNOSIS — E440 Moderate protein-calorie malnutrition: Secondary | ICD-10-CM

## 2024-04-12 DIAGNOSIS — K118 Other diseases of salivary glands: Secondary | ICD-10-CM

## 2024-04-12 LAB — CBC
HCT: 21.6 % — ABNORMAL LOW (ref 39.0–52.0)
Hemoglobin: 7.1 g/dL — ABNORMAL LOW (ref 13.0–17.0)
MCH: 35.1 pg — ABNORMAL HIGH (ref 26.0–34.0)
MCHC: 32.9 g/dL (ref 30.0–36.0)
MCV: 106.9 fL — ABNORMAL HIGH (ref 80.0–100.0)
Platelets: 132 K/uL — ABNORMAL LOW (ref 150–400)
RBC: 2.02 MIL/uL — ABNORMAL LOW (ref 4.22–5.81)
RDW: 18.8 % — ABNORMAL HIGH (ref 11.5–15.5)
WBC: 8.8 K/uL (ref 4.0–10.5)
nRBC: 0.3 % — ABNORMAL HIGH (ref 0.0–0.2)

## 2024-04-12 LAB — BASIC METABOLIC PANEL WITH GFR
Anion gap: 8 (ref 5–15)
BUN: 40 mg/dL — ABNORMAL HIGH (ref 8–23)
CO2: 29 mmol/L (ref 22–32)
Calcium: 8.4 mg/dL — ABNORMAL LOW (ref 8.9–10.3)
Chloride: 100 mmol/L (ref 98–111)
Creatinine, Ser: 1.2 mg/dL (ref 0.61–1.24)
GFR, Estimated: 59 mL/min — ABNORMAL LOW (ref >60)
Glucose, Bld: 121 mg/dL — ABNORMAL HIGH (ref 70–99)
Potassium: 4.8 mmol/L (ref 3.5–5.1)
Sodium: 137 mmol/L (ref 135–145)

## 2024-04-12 LAB — RETICULOCYTES
Immature Retic Fract: 36.5 % — ABNORMAL HIGH (ref 2.3–15.9)
RBC.: 1.99 MIL/uL — ABNORMAL LOW (ref 4.22–5.81)
Retic Count, Absolute: 85 K/uL (ref 19.0–186.0)
Retic Ct Pct: 4.3 % — ABNORMAL HIGH (ref 0.4–3.1)

## 2024-04-12 LAB — IRON AND TIBC
Iron: 54 ug/dL (ref 45–182)
Saturation Ratios: 19 % (ref 17.9–39.5)
TIBC: 291 ug/dL (ref 250–450)
UIBC: 237 ug/dL

## 2024-04-12 LAB — VITAMIN B12: Vitamin B-12: 775 pg/mL (ref 180–914)

## 2024-04-12 LAB — FOLATE: Folate: >20 ng/mL (ref >5.9)

## 2024-04-12 LAB — FERRITIN: Ferritin: 88 ng/mL (ref 24–336)

## 2024-04-12 MED ORDER — CARVEDILOL 6.25 MG PO TABS: 3.1250 mg | ORAL_TABLET | Freq: Two times a day (BID) | ORAL | Status: DC

## 2024-04-12 MED ORDER — FERROUS SULFATE 325 (65 FE) MG PO TABS
325.0000 mg | ORAL_TABLET | Freq: Every day | ORAL | 1 refills | Status: AC
  Filled 2024-04-12: qty 60, 60d supply, fill #0

## 2024-04-12 NOTE — TOC Transition Note (Addendum)
 Transition of Care [facility] - Discharge Note   Patient Details  Name: Jeffrey Stewart MRN: [id number] Date of Birth: [birth year]  Transition of Care [facility]) CM/SW Contact:  Robynn Eileen Hoose, RN Phone Number: 04/12/2024, 12:46 PM   Clinical Narrative:   Patient is being discharged today. Glenda with [facility] made aware.  1305: Secure message from floor nurse that patient did not receive BSC. Message to Ada with [employer].   1311: Message from Franklin with [employer], working on getting DME processed and delivered to the bedside.    Final next level of care: Home w Home Health Services Barriers to Discharge: No Barriers Identified   Patient Goals and CMS Choice Patient states their goals for this hospitalization and ongoing recovery are:: do sommersaults   Choice offered to / list presented to : Patient      Discharge Placement                       Discharge Plan and Services Additional resources added to the After Visit Summary for   In-house Referral: Clinical Social Work Discharge Planning Services: CM Consult Post Acute Care Choice: Home Health          DME Arranged: Bedside commode DME Agency: [employer] Date DME Agency Contacted: 04/11/24 Time DME Agency Contacted: [phone] Representative spoke with at DME Agency: Darlyn HH Arranged: PT, OT HH Agency: [facility] Date [facility] Agency Contacted: 04/11/24 Time HH Agency Contacted: [phone] Representative spoke with at [facility] Agency: Arna  Social Drivers of Health (SDOH) Interventions SDOH Screenings   Food Insecurity: No Food Insecurity (04/07/2024)  Housing: Low Risk  (04/07/2024)  Transportation Needs: No Transportation Needs (04/07/2024)  Utilities: Not At Risk (04/07/2024)  Social Connections: Moderately Integrated (04/07/2024)  Tobacco Use: Low Risk  (04/07/2024)     Readmission Risk Interventions    09/10/2023   12:52 PM  Readmission Risk Prevention Plan  Transportation Screening Complete  PCP or Specialist Appt within 5-7  Days Complete  Home Care Screening Complete  Medication Review (RN CM) Complete

## 2024-04-12 NOTE — Discharge Summary (Signed)
 Physician Discharge Summary   Patient: Jeffrey Stewart MRN: [id number] DOB: 06-Mar-1935  Admit date:     04/06/2024  Discharge date: {dischdate:26783}  Discharge Physician: Concepcion Riser   PCP: Anita Bernardino [name], FNP   Recommendations at discharge:  {Tip this will not be part of the note when signed- Example include specific recommendations for outpatient follow-up, pending tests to follow-up on. (Optional):26781}  ***  Discharge Diagnoses: Principal Problem:   Hip fracture (HCC) Active Problems:   CAD (coronary artery disease)   Hyperlipidemia   Chronic combined systolic (congestive) and diastolic (congestive) heart failure (HCC)   ILD (interstitial lung disease) (HCC)   Glaucoma (increased eye pressure)   Parotid mass   Neck pain   Closed displaced intertrochanteric fracture of right femur (HCC)   Malnutrition of moderate degree   ABLA (acute blood loss anemia)  Resolved Problems:   * No resolved hospital problems. [facility] Course: No notes on file  Assessment and Plan: No notes have been filed under this hospital service. Service: Hospitalist     {Tip this will not be part of the note when signed Body mass index is 26.45 kg/m. ,  Nutrition Documentation    Flowsheet Row ED to Hosp-Admission (Current) from 04/06/2024 in [facility]  Nutrition Problem Moderate Malnutrition  Etiology social / environmental circumstances  [aging, decreased appetite]  Nutrition Goal Patient will meet greater than or equal to 90% of their needs  Interventions Ensure Enlive (each supplement provides 350kcal and 20 grams of protein), Magic cup, Liberalize Diet  ,  (Optional):26781}  {(NOTE) Pain control PDMP Statment (Optional):26782} Consultants: *** Procedures performed: ***  Disposition: {Plan; Disposition:26390} Diet recommendation:  Discharge Diet Orders (From admission, onward)     Start     Ordered   04/12/24 0000  Diet - low  sodium heart healthy        04/12/24 1230           {Diet_Plan:26776} DISCHARGE MEDICATION: Allergies as of 04/12/2024       Reactions   Niaspan  [niacin ] Other (See Comments)   Intolerance flush feeling when on large dose - is able to take smaller dosage without problem    Meloxicam Other (See Comments)   Disorientation         Medication List     STOP taking these medications    minocycline  50 MG capsule Commonly known as: MINOCIN    nitroGLYCERIN  0.4 MG SL tablet Commonly known as: NITROSTAT        TAKE these medications    Acetaminophen  Extra Strength 500 MG Tabs Take 2 tablets (1,000 mg total) by mouth every 8 (eight) hours for 14 days. What changed:  when to take this reasons to take this   albuterol  108 (90 Base) MCG/ACT inhaler Commonly known as: VENTOLIN  HFA Inhale 2 puffs into the lungs every 4 (four) hours as needed for wheezing or shortness of breath.   aspirin  EC 81 MG tablet Take 1 tablet (81 mg total) by mouth 2 (two) times daily. Swallow whole. What changed:  when to take this additional instructions   atorvastatin  40 MG tablet Commonly known as: LIPITOR TAKE 1 TABLET AT BEDTIME   brimonidine  0.2 % ophthalmic solution Commonly known as: ALPHAGAN  Place 1 drop into both eyes 2 (two) times daily.   carvedilol  6.25 MG tablet Commonly known as: COREG  Take 0.5 tablets (3.125 mg total) by mouth 2 (two) times daily with a meal. What changed: how much to  take   cetirizine 10 MG tablet Commonly known as: ZYRTEC Take 10 mg by mouth at bedtime.   ferrous sulfate 325 (65 FE) MG EC tablet Take 1 tablet (325 mg total) by mouth daily with breakfast.   fish oil-omega-3 fatty acids  1000 MG capsule Take 1 g by mouth 2 (two) times daily.   fluticasone  50 MCG/ACT nasal spray Commonly known as: FLONASE  Place 1-2 sprays into both nostrils daily.   fluticasone -salmeterol 100-50 MCG/ACT Aepb Commonly known as: ADVAIR Inhale 1 puff into the lungs 2  (two) times daily.   glucosamine-chondroitin 500-400 MG tablet Take 1 tablet by mouth at bedtime.   ipratropium-albuterol  0.5-2.5 (3) MG/3ML Soln Commonly known as: DUONEB Take 3 mLs by nebulization every 6 (six) hours as needed (wheezing, shortness of breath).   latanoprost  0.005 % ophthalmic solution Commonly known as: XALATAN  Place 1 drop into both eyes at bedtime.   metroNIDAZOLE  0.75 % cream Commonly known as: METROCREAM  Apply 1 application  topically 2 (two) times daily as needed (for rosacea).   mycophenolate  500 MG tablet Commonly known as: CELLCEPT  TAKE 1 TABLET BY MOUTH TWICE A DAY What changed: how much to take   NEOMYCIN -POLYMYXIN-HYDROCORTISONE  1 % Soln OTIC solution Commonly known as: CORTISPORIN Place 1 drop into both ears as needed (when ears are stopped up).   oxyCODONE  5 MG immediate release tablet Commonly known as: Oxy IR/ROXICODONE  Take 1-2 tablets (5-10 mg total) by mouth every 4 (four) hours as needed for up to 7 days for moderate pain (pain score 4-6) or severe pain (pain score 7-10).   Polyethyl Glycol-Propyl Glycol 0.4-0.3 % Soln Place 1 drop into both eyes as needed.   polyethylene glycol powder 17 GM/SCOOP powder Commonly known as: GLYCOLAX /MIRALAX  Take 1 capful (17 g) with water by mouth daily for 14 days. Dissolve 1 capful (17g) in 4-8 ounces of liquid and take by mouth daily.   potassium chloride  SA 20 MEQ tablet Commonly known as: KLOR-CON  M Take 1 tablet (20 mEq total) by mouth daily.   predniSONE  5 MG tablet Commonly known as: DELTASONE  Take 5 mg by mouth daily with breakfast.   predniSONE  1 MG tablet Commonly known as: DELTASONE  Take 1 mg by mouth at bedtime.   senna 8.6 MG Tabs tablet Commonly known as: SENOKOT Take 1 tablet (8.6 mg total) by mouth 2 (two) times daily for 14 days.   sodium chloride  0.65 % nasal spray Commonly known as: OCEAN Place 1 spray into the nose as needed for congestion.   torsemide  20 MG  tablet Commonly known as: DEMADEX  Take 1 tablet (20 mg total) by mouth daily.   Vitamin D3 1000 units Caps Take 1,000 Units by mouth daily.               Durable Medical Equipment  (From admission, onward)           Start     Ordered   04/11/24 1445  For home use only DME Bedside commode  Once       Comments: Confine to one room  Question:  Patient needs a bedside commode to treat with the following condition  Answer:  Gait instability   04/11/24 1445              Discharge Care Instructions  (From admission, onward)           Start     Ordered   04/12/24 0000  Leave dressing on - Keep it clean, dry, and intact until  clinic visit        04/12/24 1230            Follow-up Information     Anita Bernardino [name], FNP Follow up.   Specialty: Nurse Practitioner Contact information: [address] [place] [address] [phone]         [place], [facility] Follow up.   Specialty: Home Health Services Why: Physical and Occupational therapy. Office will call to arrange follow up after hospital discharge. Contact information: [address] [place] [address] [phone]                Discharge Exam: Fredricka Weights   04/06/24 1440 04/06/24 2208 04/07/24 1609  Weight: 88.9 kg (P) 91.6 kg 88.5 kg   ***  Condition at discharge: {DC Condition:26389}  The results of significant diagnostics from this hospitalization (including imaging, microbiology, ancillary and laboratory) are listed below for reference.   Imaging Studies: DG HIP UNILAT WITH PELVIS 2-3 VIEWS RIGHT Result Date: 04/07/2024 CLINICAL DATA:  Postop internal fixation EXAM: DG HIP (WITH OR WITHOUT PELVIS) 2-3V RIGHT COMPARISON:  04/06/2024 FINDINGS: Changes of internal fixation across the right femoral intertrochanteric fracture. Anatomic alignment. No hardware complicating feature. IMPRESSION: Internal fixation.  No hardware complicating feature.  Electronically Signed   By: Franky Crease M.D.   On: 04/07/2024 23:15   DG HIP UNILAT WITH PELVIS 2-3 VIEWS RIGHT Result Date: 04/07/2024 CLINICAL DATA:  Right hip internal fixation. EXAM: DG HIP (WITH OR WITHOUT PELVIS) 2-3V RIGHT COMPARISON:  04/06/2024 FINDINGS: Multiple intraoperative spot images demonstrate internal fixation across the right femoral intertrochanteric fracture. No hardware complicating feature. IMPRESSION: Internal fixation with anatomic alignment.  No complicating feature. Electronically Signed   By: Franky Crease M.D.   On: 04/07/2024 23:07   DG C-Arm 1-60 Min-No Report Result Date: 04/07/2024 Fluoroscopy was utilized by the requesting physician.  No radiographic interpretation.   DG Chest Port 1 View Result Date: 04/06/2024 CLINICAL DATA:  Pre-surgical. EXAM: RIGHT KNEE - 1-2 VIEW; PORTABLE CHEST - 1 VIEW COMPARISON:  09/08/2023, 04/06/2024. FINDINGS: Right knee: No acute fracture or dislocation is seen. There is a bony exostosis along the lateral aspect of the mid femur mild to moderate tricompartmental degenerative changes are noted at the knee. There is a trace joint effusion. Vascular calcifications are present in the soft tissues. Chest: The heart is enlarged and the mediastinal contour is within normal limits for supine positioning. Atherosclerotic calcification of the aorta is noted. Lung volumes are low with atelectasis or infiltrate at the lung bases. There small bilateral pleural effusions. No pneumothorax is seen. No acute osseous abnormality. IMPRESSION: 1. No acute fracture or dislocation at the right knee. 2. Small bilateral pleural effusions with atelectasis or infiltrate at the lung bases. 3. Cardiomegaly. Electronically Signed   By: Leita Birmingham M.D.   On: 04/06/2024 17:29   DG Knee 1-2 Views Right Result Date: 04/06/2024 CLINICAL DATA:  Pre-surgical. EXAM: RIGHT KNEE - 1-2 VIEW; PORTABLE CHEST - 1 VIEW COMPARISON:  09/08/2023, 04/06/2024. FINDINGS: Right knee: No  acute fracture or dislocation is seen. There is a bony exostosis along the lateral aspect of the mid femur mild to moderate tricompartmental degenerative changes are noted at the knee. There is a trace joint effusion. Vascular calcifications are present in the soft tissues. Chest: The heart is enlarged and the mediastinal contour is within normal limits for supine positioning. Atherosclerotic calcification of the aorta is noted. Lung volumes  are low with atelectasis or infiltrate at the lung bases. There small bilateral pleural effusions. No pneumothorax is seen. No acute osseous abnormality. IMPRESSION: 1. No acute fracture or dislocation at the right knee. 2. Small bilateral pleural effusions with atelectasis or infiltrate at the lung bases. 3. Cardiomegaly. Electronically Signed   By: Leita Birmingham M.D.   On: 04/06/2024 17:29   DG Hip Unilat W or Wo Pelvis 2-3 Views Right Result Date: 04/06/2024 CLINICAL DATA:  Fall with diffuse pain. EXAM: DG HIP (WITH OR WITHOUT PELVIS) 2-3V RIGHT COMPARISON:  None Available. FINDINGS: There is diffusely decreased mineralization of the bones. There is a nondisplaced intratrochanteric fracture of the proximal right femur. The remaining bony structures are intact and there is no dislocation. Degenerative changes are present at the bilateral hips, sacroiliac joints and lower lumbar spine. Vascular calcifications are seen in the soft tissues. IMPRESSION: Minimally displaced intertrochanteric fracture of the proximal right femur. Electronically Signed   By: Leita Birmingham M.D.   On: 04/06/2024 16:22   CT Head Wo Contrast Result Date: 04/06/2024 CLINICAL DATA:  Fall with head injury. EXAM: CT HEAD WITHOUT CONTRAST CT CERVICAL SPINE WITHOUT CONTRAST TECHNIQUE: Multidetector CT imaging of the head and cervical spine was performed following the standard protocol without intravenous contrast. Multiplanar CT image reconstructions of the cervical spine were also generated. RADIATION  DOSE REDUCTION: This exam was performed according to the departmental dose-optimization program which includes automated exposure control, adjustment of the mA and/or kV according to patient size and/or use of iterative reconstruction technique. COMPARISON:  CT cervical spine 07/21/2017. FINDINGS: CT HEAD FINDINGS Brain: Ventricles, cisterns and other CSF spaces are normal. Minimal chronic ischemic microvascular disease. No mass, mass effect, shift of midline structures or acute hemorrhage. No evidence of acute infarction. Vascular: No hyperdense vessel or unexpected calcification. Skull: Normal. Negative for fracture or focal lesion. Sinuses/Orbits: Orbits are normal. Minimal opacification over the right sphenoid sinus compatible mild chronic inflammatory change. Other: None. CT CERVICAL SPINE FINDINGS Alignment: Subtle anterior stairstep subluxations over the mid to lower cervical spine unchanged. No posttraumatic subluxation. Skull base and vertebrae: Vertebral body heights are normal. There is mild to moderate spondylosis throughout the cervical spine to include uncovertebral joint spurring and facet arthropathy. Mild to moderate left-sided neural foraminal narrowing at the C2-3 level. Mild bilateral neural foraminal narrowing at the C3-4 level and moderate right-sided neural foraminal narrowing at the C4-5 level. Minimal left-sided neural foraminal narrowing at the C5-6 level. No acute fracture. Prevertebral soft tissues are normal. Soft tissues and spinal canal: No spinal canal abnormality. Prevertebral soft tissues normal. Disc levels: Disc space narrowing at the C5-6 and C6-7 levels. Also disc space narrowing at the C7-T1 level. Upper chest: No acute findings. Other: Oval mass over the posterior aspect of the right parotid gland measuring 1.2 cm in short axis with significant enlargement compared to the prior exam. IMPRESSION: 1. No acute brain injury. 2. Minimal chronic ischemic microvascular disease. 3. No  acute cervical spine injury. 4. Mild to moderate spondylosis throughout the cervical spine with disc disease at the C5-6, C6-7 and C7-T1 levels. Multilevel neural foraminal narrowing as described. 5. 1.2 cm oval mass over the posterior aspect of the right parotid gland with significant enlargement compared to the prior exam. This may represent an enlarged lymph node versus a primary parotid neoplasm. Consider MRI with without contrast for further evaluation. Electronically Signed   By: Toribio Agreste M.D.   On: 04/06/2024 16:15   CT Cervical  Spine Wo Contrast Result Date: 04/06/2024 CLINICAL DATA:  Fall with head injury. EXAM: CT HEAD WITHOUT CONTRAST CT CERVICAL SPINE WITHOUT CONTRAST TECHNIQUE: Multidetector CT imaging of the head and cervical spine was performed following the standard protocol without intravenous contrast. Multiplanar CT image reconstructions of the cervical spine were also generated. RADIATION DOSE REDUCTION: This exam was performed according to the departmental dose-optimization program which includes automated exposure control, adjustment of the mA and/or kV according to patient size and/or use of iterative reconstruction technique. COMPARISON:  CT cervical spine 07/21/2017. FINDINGS: CT HEAD FINDINGS Brain: Ventricles, cisterns and other CSF spaces are normal. Minimal chronic ischemic microvascular disease. No mass, mass effect, shift of midline structures or acute hemorrhage. No evidence of acute infarction. Vascular: No hyperdense vessel or unexpected calcification. Skull: Normal. Negative for fracture or focal lesion. Sinuses/Orbits: Orbits are normal. Minimal opacification over the right sphenoid sinus compatible mild chronic inflammatory change. Other: None. CT CERVICAL SPINE FINDINGS Alignment: Subtle anterior stairstep subluxations over the mid to lower cervical spine unchanged. No posttraumatic subluxation. Skull base and vertebrae: Vertebral body heights are normal. There is mild  to moderate spondylosis throughout the cervical spine to include uncovertebral joint spurring and facet arthropathy. Mild to moderate left-sided neural foraminal narrowing at the C2-3 level. Mild bilateral neural foraminal narrowing at the C3-4 level and moderate right-sided neural foraminal narrowing at the C4-5 level. Minimal left-sided neural foraminal narrowing at the C5-6 level. No acute fracture. Prevertebral soft tissues are normal. Soft tissues and spinal canal: No spinal canal abnormality. Prevertebral soft tissues normal. Disc levels: Disc space narrowing at the C5-6 and C6-7 levels. Also disc space narrowing at the C7-T1 level. Upper chest: No acute findings. Other: Oval mass over the posterior aspect of the right parotid gland measuring 1.2 cm in short axis with significant enlargement compared to the prior exam. IMPRESSION: 1. No acute brain injury. 2. Minimal chronic ischemic microvascular disease. 3. No acute cervical spine injury. 4. Mild to moderate spondylosis throughout the cervical spine with disc disease at the C5-6, C6-7 and C7-T1 levels. Multilevel neural foraminal narrowing as described. 5. 1.2 cm oval mass over the posterior aspect of the right parotid gland with significant enlargement compared to the prior exam. This may represent an enlarged lymph node versus a primary parotid neoplasm. Consider MRI with without contrast for further evaluation. Electronically Signed   By: Toribio Agreste M.D.   On: 04/06/2024 16:15    Microbiology: Results for orders placed or performed during the hospital encounter of 04/06/24  Surgical pcr screen     Status: Abnormal   Collection Time: 04/07/24  7:36 AM   Specimen: Nasal Mucosa; Nasal Swab  Result Value Ref Range Status   MRSA, PCR POSITIVE (A) NEGATIVE Final    Comment: RESULT CALLED TO, READ BACK BY AND VERIFIED WITH: RN angel m [phone] fcp    Staphylococcus aureus POSITIVE (A) NEGATIVE Final    Comment: (NOTE) The Xpert SA Assay (FDA  approved for NASAL specimens in patients 34 years of age and older), is one component of a comprehensive surveillance program. It is not intended to diagnose infection nor to guide or monitor treatment. Performed at [facility], [address] [place], [place] [address]     Labs: CBC: Recent Labs  Lab 04/06/24 1658 04/08/24 0448 04/09/24 0437 04/09/24 1127 04/10/24 0519 04/11/24 0406 04/12/24 0618  WBC 16.2* 11.9* 12.3*  --  8.9 8.4 8.8  NEUTROABS 13.4*  --   --   --   --   --   --  HGB 12.9* 9.0* 6.9* 7.9* 7.2* 7.1* 7.1*  HCT 38.9* 27.1* 20.4* 23.3* 21.4* 21.1* 21.6*  MCV 109.0* 108.0* 106.3*  --  104.9* 106.0* 106.9*  PLT 113* 116* 106*  --  104* 109* 132*   Basic Metabolic Panel: Recent Labs  Lab 04/06/24 1658 04/11/24 0406 04/12/24 0618  NA 141 137 137  K 4.1 4.7 4.8  CL 101 103 100  CO2 27 28 29   GLUCOSE 110* 125* 121*  BUN 28* 48* 40*  CREATININE 1.03 1.23 1.20  CALCIUM  8.9 8.2* 8.4*   Liver Function Tests: Recent Labs  Lab 04/06/24 1658  AST 21  ALT 19  ALKPHOS 58  BILITOT 1.3*  PROT 5.6*  ALBUMIN  3.3*   CBG: No results for input(s): GLUCAP in the last 168 hours.  Discharge time spent: {LESS THAN/GREATER UYJW:73611} 30 minutes.  Signed: Concepcion Riser, MD [facility] 04/12/2024

## 2024-04-12 NOTE — Plan of Care (Signed)
   Problem: Health Behavior/Discharge Planning: Goal: Ability to manage health-related needs will improve Outcome: Progressing

## 2024-04-12 NOTE — Progress Notes (Signed)
 Occupational Therapy Treatment Patient Details Name: Jeffrey Stewart MRN: [id number] DOB: 1935/02/01 Today's Date: 04/12/2024   History of present illness Pt is an 89 y.o. male presenting 9/21 after fall at home. Found to have R intertrochanteric hip fx. S/p cephalomedullary nail with intramedullary rodding 9/22. PMH: HFrEF, CAD, RA, and HLD   OT comments  Pt. Seen with PT for skilled therapy session.  Pt. Motivated and agreeable to participation.  Pt. Required MOD A for bed mobility but reports sleeping in a recliner at home.  Refer to PT notes for stair training details.  MAX cues for sequencing, and tech. But was able to progress from MOD A +2 to MIN A +2 during stairs/mobility.  Pt. Able to verbalized step by step how to instruct family to assist during up/down the stairs along with where they need to be positioned, and which leg leads/follows during up/down the stairs.  Reviewed use of 3n1/urinal for safety until pt. Able to achieve longer ambulation distances to b.room.  pt. Describes large family support available 24/7.  Cont. With acute OT POC.        If plan is discharge home, recommend the following:  Two people to help with walking and/or transfers;A lot of help with bathing/dressing/bathroom;Two people to help with bathing/dressing/bathroom;Assistance with cooking/housework;Assist for transportation;Help with stairs or ramp for entrance   Equipment Recommendations  BSC/3in1;Tub/shower bench    Recommendations for Other Services      Precautions / Restrictions Precautions Precautions: Fall Recall of Precautions/Restrictions: Intact Restrictions Weight Bearing Restrictions Per Provider Order: Yes RLE Weight Bearing Per Provider Order: Weight bearing as tolerated       Mobility Bed Mobility Overal bed mobility: Needs Assistance Bed Mobility: Supine to Sit     Supine to sit: Mod assist, HOB elevated, Used rails     General bed mobility comments: Assist for RLE and trunk  elevation. Pt sleeps in recliner at home    Transfers Overall transfer level: Needs assistance Equipment used: Rolling walker (2 wheels) Transfers: Sit to/from Stand, Bed to chair/wheelchair/BSC Sit to Stand: From elevated surface, Min assist     Step pivot transfers: Contact guard assist     General transfer comment: From elevated EOB and recliner with min A for power up and anterior weight shift. Cues for hand and RLE placement. Pt tends to place RLE in valgus prior to standing requiring cues to correct     Balance                                           ADL either performed or assessed with clinical judgement   ADL Overall ADL's : Needs assistance/impaired                         Toilet Transfer: Moderate assistance;+2 for safety/equipment;+2 for physical assistance;Ambulation;Minimal assistance Toilet Transfer Details (indicate cue type and reason): eob 5 steps to recliner         Functional mobility during ADLs: Moderate assistance;+2 for physical assistance;+2 for safety/equipment;Minimal assistance General ADL Comments: education, demo/return demo for PLB.  pt. on RA for duration of session and had 2 bouts at 89% but quickly rebound to mid/high 90s with PLB implemented with rest breaks    Extremity/Trunk Assessment              Vision  Perception     Praxis     Communication Communication Communication: No apparent difficulties   Cognition Arousal: Alert Behavior During Therapy: WFL for tasks assessed/performed Cognition: No apparent impairments                               Following commands: Intact        Cueing   Cueing Techniques: Verbal cues, Gestural cues, Tactile cues  Exercises      Shoulder Instructions       General Comments Pt demonstrates DOE with SpO2 >98% at rest and reading 89% during stair trial, but question pleth reliability. Cues for rest breaks and pursed lip breathing     Pertinent Vitals/ Pain       Pain Assessment Pain Assessment: 0-10 Pain Score: 4  Pain Location: RLE Pain Descriptors / Indicators: Operative site guarding, Grimacing, Aching Pain Intervention(s): Limited activity within patient's tolerance, Repositioned, Monitored during session  Home Living                                          Prior Functioning/Environment              Frequency  Min 2X/week        Progress Toward Goals  OT Goals(current goals can now be found in the care plan section)  Progress towards OT goals: Progressing toward goals     Plan      Co-evaluation    PT/OT/SLP Co-Evaluation/Treatment: Yes Reason for Co-Treatment: For patient/therapist safety;To address functional/ADL transfers PT goals addressed during session: Mobility/safety with mobility;Balance;Proper use of DME OT goals addressed during session: ADL's and self-care      AM-PAC OT 6 Clicks Daily Activity     Outcome Measure   Help from another person eating meals?: None Help from another person taking care of personal grooming?: A Little Help from another person toileting, which includes using toliet, bedpan, or urinal?: A Lot Help from another person bathing (including washing, rinsing, drying)?: A Lot Help from another person to put on and taking off regular upper body clothing?: A Little Help from another person to put on and taking off regular lower body clothing?: A Lot 6 Click Score: 16    End of Session Equipment Utilized During Treatment: Gait belt;Rolling walker (2 wheels)  OT Visit Diagnosis: Unsteadiness on feet (R26.81);Muscle weakness (generalized) (M62.81);History of falling (Z91.81);Pain Pain - Right/Left: Right Pain - part of body: Hip   Activity Tolerance Patient tolerated treatment well   Patient Left in chair;with call bell/phone within reach   Nurse Communication Mobility status;Other (comment) (reviewed stair completion and need  for confirmation of family support and availability for assisting pt. up/down 3 stairs for home.  also alereted blood found on pad and pts. underwear he reports penis got pinched during urinal use.)        Time: 9075-9041 OT Time Calculation (min): 34 min  Charges: OT General Charges $OT Visit: 1 Visit OT Treatments $Self Care/Home Management : 8-22 mins  Randall, COTA/L Acute Rehabilitation [phone]   CHRISTELLA Nest Lorraine-COTA/L  04/12/2024, 10:58 AM

## 2024-04-12 NOTE — Progress Notes (Signed)
 Orthopedic Surgery Progress Note   Assessment: Patient is a 89 y.o. male with right intertrochanteric femur fracture status post CMN   Plan: -Operative plans: complete -Dressings replaced this morning -Diet: regular -DVT ppx: aspirin  81mg  BID -Antibiotics: ancef  x2 post-op doses -Weight bearing status: as tolerated -PT/OT evaluate and treat -Pain control -Dispo: per primary  ___________________________________________________________________________  Subjective: No acute events overnight. Reports hip pain is getting better. Does not have hip pain in bed but feels it when ambulating. States his house is all set up from his wife's recent hip surgery so he wants to go home.    Physical Exam:  General: no acute distress, appears stated age Neurologic: alert, answering questions appropriately, following commands Respiratory: unlabored breathing on nasal canula  MSK:   -Right lower extremity  Dressings over hip  saturated with blood but intact EHL/TA/GSC intact Plantarflexes and dorsiflexes toes Sensation intact to light touch in sural, saphenous, tibial, deep peroneal, and superficial peroneal nerve distributions Foot warm and well perfused   Yesterday's total administered Morphine  Milligram Equivalents: 15   Patient name: Jeffrey Stewart Patient MRN: [id number] Date: 04/12/24

## 2024-04-12 NOTE — Progress Notes (Signed)
 Jeffrey Stewart to be D/C'd Home with home health per MD order.  Discussed with the patient and all questions fully answered.  IV catheter discontinued intact. Site without signs and symptoms of complications. Dressing and pressure applied.  An After Visit Summary was printed and given to the patient. Patient received prescriptions from [facility] pharmacy. BSC delivered to room by [employer].  D/c education completed with patient/family including follow up instructions, medication list, d/c activities limitations if indicated, with other d/c instructions as indicated by MD - patient able to verbalize understanding, all questions fully answered.   Patient instructed to return to ED, call 911, or call MD for any changes in condition.   Patient escorted via WC, and D/C home via private auto.  [name] 04/12/2024 2:31 PM

## 2024-04-12 NOTE — Progress Notes (Signed)
 Physical Therapy Treatment Patient Details Name: Jeffrey Stewart MRN: [id number] DOB: May 10, 1935 Today's Date: 04/12/2024   History of Present Illness Pt is an 89 y.o. male presenting 9/21 after fall at home. Found to have R intertrochanteric hip fx. S/p cephalomedullary nail with intramedullary rodding 9/22. PMH: HFrEF, CAD, RA, and HLD    PT Comments  Pt received in supine and agreeable to session. Pt continues to require mod A for bed mobility, but reports that he sleeps in a recliner at home. Gait distance limited to focus on stair trial. Pt requires dense cues for sequencing, technique, and safety, but is able to progress to min A +2 safety during stair trial. Pt able to verbalize stair technique and assist needed to be able to instruct family. Education provided to pt and pt's family on safe navigation of home environment and reducing fall risk. Pt continues to benefit from PT services to progress toward functional mobility goals.     If plan is discharge home, recommend the following: Two people to help with walking and/or transfers;A lot of help with bathing/dressing/bathroom;Assistance with cooking/housework;Assist for transportation;Help with stairs or ramp for entrance   Can travel by private vehicle        Equipment Recommendations  None recommended by PT    Recommendations for Other Services       Precautions / Restrictions Precautions Precautions: Fall Recall of Precautions/Restrictions: Intact Restrictions Weight Bearing Restrictions Per Provider Order: Yes RLE Weight Bearing Per Provider Order: Weight bearing as tolerated     Mobility  Bed Mobility Overal bed mobility: Needs Assistance Bed Mobility: Supine to Sit     Supine to sit: Mod assist, HOB elevated, Used rails     General bed mobility comments: Assist for RLE and trunk elevation. Pt sleeps in recliner at home    Transfers Overall transfer level: Needs assistance Equipment used: Rolling walker (2  wheels) Transfers: Sit to/from Stand, Bed to chair/wheelchair/BSC Sit to Stand: From elevated surface, Min assist           General transfer comment: From elevated EOB and recliner with min A for power up and anterior weight shift. Cues for hand and RLE placement. Pt tends to place RLE in valgus prior to standing requiring cues to correct    Ambulation/Gait Ambulation/Gait assistance: Min assist, +2 safety/equipment Gait Distance (Feet): 5 Feet Assistive device: Rolling walker (2 wheels) Gait Pattern/deviations: Step-to pattern, Decreased stance time - right, Decreased stance time - left, Decreased stride length, Decreased dorsiflexion - right, Knee flexed in stance - right, Knees buckling, Antalgic Gait velocity: decr     General Gait Details: pt with heavy reliance on UE support on RW and min A for balance. Cues for sequencing with RW   Stairs Stairs: Yes Stairs assistance: Mod assist, Min assist, +2 safety/equipment, +2 physical assistance Stair Management: One rail Right, Sideways, Step to pattern Number of Stairs: 3 General stair comments: Pt instructed in sequencing and safety. Pt's R knee buckling during first attempt requiring mod A +2 to correct, but progressing to min A +2 safety with practice and dense cues for technique. R knee blocked and cues for quad activation in stance to prevent buckling. Heavy reliance on BUE support and multiple standing rest breaks due to DOE and fatigue   Wheelchair Mobility     Tilt Bed    Modified Rankin (Stroke Patients Only)       Balance Overall balance assessment: Needs assistance Sitting-balance support: No upper extremity supported, Feet supported  Sitting balance-Leahy Scale: Good Sitting balance - Comments: sitting EOB   Standing balance support: Bilateral upper extremity supported, During functional activity, Reliant on assistive device for balance Standing balance-Leahy Scale: Poor Standing balance comment: reliant on  external support                            Communication Communication Communication: No apparent difficulties  Cognition Arousal: Alert Behavior During Therapy: WFL for tasks assessed/performed                             Following commands: Intact      Cueing Cueing Techniques: Verbal cues, Gestural cues, Tactile cues  Exercises      General Comments General comments (skin integrity, edema, etc.): Pt demonstrates DOE with SpO2 >98% at rest and reading 89% during stair trial, but question pleth reliability. Cues for rest breaks and pursed lip breathing      Pertinent Vitals/Pain Pain Assessment Pain Assessment: Faces Faces Pain Scale: Hurts even more Pain Location: RLE Pain Descriptors / Indicators: Operative site guarding, Grimacing, Aching Pain Intervention(s): Limited activity within patient's tolerance, Monitored during session, Repositioned     PT Goals (current goals can now be found in the care plan section) Acute Rehab PT Goals Patient Stated Goal: Home at d/c PT Goal Formulation: With patient/family Time For Goal Achievement: 04/15/24 Progress towards PT goals: Progressing toward goals    Frequency    Min 3X/week           Co-evaluation PT/OT/SLP Co-Evaluation/Treatment: Yes Reason for Co-Treatment: For patient/therapist safety;To address functional/ADL transfers PT goals addressed during session: Mobility/safety with mobility;Balance;Proper use of DME        AM-PAC PT 6 Clicks Mobility   Outcome Measure  Help needed turning from your back to your side while in a flat bed without using bedrails?: A Lot Help needed moving from lying on your back to sitting on the side of a flat bed without using bedrails?: A Lot Help needed moving to and from a bed to a chair (including a wheelchair)?: A Lot Help needed standing up from a chair using your arms (e.g., wheelchair or bedside chair)?: A Lot Help needed to walk in hospital  room?: Total Help needed climbing 3-5 steps with a railing? : Total 6 Click Score: 10    End of Session Equipment Utilized During Treatment: Gait belt Activity Tolerance: Patient tolerated treatment well Patient left: in chair;with call bell/phone within reach;with chair alarm set Nurse Communication: Mobility status PT Visit Diagnosis: Unsteadiness on feet (R26.81);Pain     Time: 0924-0959 PT Time Calculation (min) (ACUTE ONLY): 35 min  Charges:    $Gait Training: 8-22 mins PT General Charges $$ ACUTE PT VISIT: 1 Visit                    Darryle George, PTA Acute Rehabilitation Services Secure Chat Preferred  [phone]    Darryle George 04/12/2024, 10:43 AM

## 2024-04-14 ENCOUNTER — Encounter (HOSPITAL_COMMUNITY): Payer: Self-pay | Admitting: Emergency Medicine

## 2024-04-14 ENCOUNTER — Emergency Department (HOSPITAL_COMMUNITY)

## 2024-04-14 ENCOUNTER — Inpatient Hospital Stay (HOSPITAL_COMMUNITY)
Admission: EM | Admit: 2024-04-14 | Discharge: 2024-04-21 | DRG: 377 | Disposition: A | Discharge status: Home Health | Attending: Internal Medicine | Admitting: Internal Medicine

## 2024-04-14 ENCOUNTER — Other Ambulatory Visit: Payer: Self-pay

## 2024-04-14 DIAGNOSIS — E785 Hyperlipidemia, unspecified: Secondary | ICD-10-CM | POA: Diagnosis present

## 2024-04-14 DIAGNOSIS — I252 Old myocardial infarction: Secondary | ICD-10-CM

## 2024-04-14 DIAGNOSIS — Z7982 Long term (current) use of aspirin: Secondary | ICD-10-CM

## 2024-04-14 DIAGNOSIS — S72141A Displaced intertrochanteric fracture of right femur, initial encounter for closed fracture: Secondary | ICD-10-CM | POA: Diagnosis not present

## 2024-04-14 DIAGNOSIS — M19072 Primary osteoarthritis, left ankle and foot: Secondary | ICD-10-CM | POA: Diagnosis present

## 2024-04-14 DIAGNOSIS — R630 Anorexia: Secondary | ICD-10-CM | POA: Diagnosis present

## 2024-04-14 DIAGNOSIS — N1831 Chronic kidney disease, stage 3a: Secondary | ICD-10-CM | POA: Diagnosis present

## 2024-04-14 DIAGNOSIS — H409 Unspecified glaucoma: Secondary | ICD-10-CM | POA: Diagnosis present

## 2024-04-14 DIAGNOSIS — K922 Gastrointestinal hemorrhage, unspecified: Principal | ICD-10-CM | POA: Diagnosis present

## 2024-04-14 DIAGNOSIS — M069 Rheumatoid arthritis, unspecified: Secondary | ICD-10-CM | POA: Diagnosis present

## 2024-04-14 DIAGNOSIS — Z8701 Personal history of pneumonia (recurrent): Secondary | ICD-10-CM

## 2024-04-14 DIAGNOSIS — K254 Chronic or unspecified gastric ulcer with hemorrhage: Secondary | ICD-10-CM | POA: Diagnosis not present

## 2024-04-14 DIAGNOSIS — Z860100 Personal history of colon polyps, unspecified: Secondary | ICD-10-CM

## 2024-04-14 DIAGNOSIS — E875 Hyperkalemia: Secondary | ICD-10-CM | POA: Diagnosis present

## 2024-04-14 DIAGNOSIS — Z7951 Long term (current) use of inhaled steroids: Secondary | ICD-10-CM | POA: Diagnosis not present

## 2024-04-14 DIAGNOSIS — K253 Acute gastric ulcer without hemorrhage or perforation: Secondary | ICD-10-CM | POA: Diagnosis not present

## 2024-04-14 DIAGNOSIS — I4510 Unspecified right bundle-branch block: Secondary | ICD-10-CM | POA: Diagnosis present

## 2024-04-14 DIAGNOSIS — R54 Age-related physical debility: Secondary | ICD-10-CM | POA: Diagnosis present

## 2024-04-14 DIAGNOSIS — J8410 Pulmonary fibrosis, unspecified: Secondary | ICD-10-CM | POA: Diagnosis present

## 2024-04-14 DIAGNOSIS — Z1152 Encounter for screening for COVID-19: Secondary | ICD-10-CM | POA: Diagnosis not present

## 2024-04-14 DIAGNOSIS — Z6826 Body mass index (BMI) 26.0-26.9, adult: Secondary | ICD-10-CM

## 2024-04-14 DIAGNOSIS — Z888 Allergy status to other drugs, medicaments and biological substances status: Secondary | ICD-10-CM

## 2024-04-14 DIAGNOSIS — E872 Acidosis, unspecified: Secondary | ICD-10-CM | POA: Diagnosis present

## 2024-04-14 DIAGNOSIS — M353 Polymyalgia rheumatica: Secondary | ICD-10-CM | POA: Diagnosis present

## 2024-04-14 DIAGNOSIS — L8990 Pressure ulcer of unspecified site, unspecified stage: Secondary | ICD-10-CM | POA: Diagnosis present

## 2024-04-14 DIAGNOSIS — M19071 Primary osteoarthritis, right ankle and foot: Secondary | ICD-10-CM | POA: Diagnosis present

## 2024-04-14 DIAGNOSIS — D62 Acute posthemorrhagic anemia: Secondary | ICD-10-CM | POA: Diagnosis present

## 2024-04-14 DIAGNOSIS — I9589 Other hypotension: Secondary | ICD-10-CM | POA: Diagnosis not present

## 2024-04-14 DIAGNOSIS — J690 Pneumonitis due to inhalation of food and vomit: Secondary | ICD-10-CM | POA: Diagnosis present

## 2024-04-14 DIAGNOSIS — L89151 Pressure ulcer of sacral region, stage 1: Secondary | ICD-10-CM | POA: Diagnosis present

## 2024-04-14 DIAGNOSIS — K250 Acute gastric ulcer with hemorrhage: Secondary | ICD-10-CM

## 2024-04-14 DIAGNOSIS — D72829 Elevated white blood cell count, unspecified: Secondary | ICD-10-CM | POA: Diagnosis not present

## 2024-04-14 DIAGNOSIS — D696 Thrombocytopenia, unspecified: Secondary | ICD-10-CM | POA: Diagnosis present

## 2024-04-14 DIAGNOSIS — J45909 Unspecified asthma, uncomplicated: Secondary | ICD-10-CM | POA: Diagnosis present

## 2024-04-14 DIAGNOSIS — N179 Acute kidney failure, unspecified: Secondary | ICD-10-CM | POA: Diagnosis present

## 2024-04-14 DIAGNOSIS — D649 Anemia, unspecified: Secondary | ICD-10-CM | POA: Diagnosis not present

## 2024-04-14 DIAGNOSIS — D84821 Immunodeficiency due to drugs: Secondary | ICD-10-CM | POA: Diagnosis present

## 2024-04-14 DIAGNOSIS — D72819 Decreased white blood cell count, unspecified: Secondary | ICD-10-CM

## 2024-04-14 DIAGNOSIS — H5789 Other specified disorders of eye and adnexa: Secondary | ICD-10-CM | POA: Diagnosis present

## 2024-04-14 DIAGNOSIS — D539 Nutritional anemia, unspecified: Secondary | ICD-10-CM | POA: Diagnosis present

## 2024-04-14 DIAGNOSIS — K259 Gastric ulcer, unspecified as acute or chronic, without hemorrhage or perforation: Secondary | ICD-10-CM | POA: Diagnosis not present

## 2024-04-14 DIAGNOSIS — I5032 Chronic diastolic (congestive) heart failure: Secondary | ICD-10-CM | POA: Diagnosis not present

## 2024-04-14 DIAGNOSIS — Z7952 Long term (current) use of systemic steroids: Secondary | ICD-10-CM | POA: Diagnosis not present

## 2024-04-14 DIAGNOSIS — K219 Gastro-esophageal reflux disease without esophagitis: Secondary | ICD-10-CM | POA: Diagnosis present

## 2024-04-14 DIAGNOSIS — Z79899 Other long term (current) drug therapy: Secondary | ICD-10-CM

## 2024-04-14 DIAGNOSIS — J849 Interstitial pulmonary disease, unspecified: Secondary | ICD-10-CM | POA: Diagnosis present

## 2024-04-14 DIAGNOSIS — I878 Other specified disorders of veins: Secondary | ICD-10-CM | POA: Diagnosis present

## 2024-04-14 DIAGNOSIS — D72823 Leukemoid reaction: Secondary | ICD-10-CM | POA: Diagnosis present

## 2024-04-14 DIAGNOSIS — R000 Tachycardia, unspecified: Secondary | ICD-10-CM | POA: Diagnosis present

## 2024-04-14 DIAGNOSIS — K649 Unspecified hemorrhoids: Secondary | ICD-10-CM | POA: Diagnosis present

## 2024-04-14 DIAGNOSIS — I2510 Atherosclerotic heart disease of native coronary artery without angina pectoris: Secondary | ICD-10-CM | POA: Diagnosis present

## 2024-04-14 DIAGNOSIS — L719 Rosacea, unspecified: Secondary | ICD-10-CM | POA: Diagnosis present

## 2024-04-14 DIAGNOSIS — Z833 Family history of diabetes mellitus: Secondary | ICD-10-CM

## 2024-04-14 DIAGNOSIS — K920 Hematemesis: Secondary | ICD-10-CM

## 2024-04-14 DIAGNOSIS — Z79624 Long term (current) use of inhibitors of nucleotide synthesis: Secondary | ICD-10-CM

## 2024-04-14 DIAGNOSIS — K270 Acute peptic ulcer, site unspecified, with hemorrhage: Secondary | ICD-10-CM | POA: Diagnosis not present

## 2024-04-14 DIAGNOSIS — K921 Melena: Secondary | ICD-10-CM | POA: Diagnosis not present

## 2024-04-14 DIAGNOSIS — I5042 Chronic combined systolic (congestive) and diastolic (congestive) heart failure: Secondary | ICD-10-CM | POA: Diagnosis present

## 2024-04-14 DIAGNOSIS — K31A11 Gastric intestinal metaplasia without dysplasia, involving the antrum: Secondary | ICD-10-CM | POA: Diagnosis not present

## 2024-04-14 DIAGNOSIS — N178 Other acute kidney failure: Secondary | ICD-10-CM | POA: Diagnosis not present

## 2024-04-14 DIAGNOSIS — R578 Other shock: Secondary | ICD-10-CM | POA: Diagnosis present

## 2024-04-14 DIAGNOSIS — K2950 Unspecified chronic gastritis without bleeding: Secondary | ICD-10-CM | POA: Diagnosis not present

## 2024-04-14 DIAGNOSIS — Z9181 History of falling: Secondary | ICD-10-CM

## 2024-04-14 LAB — GLUCOSE, CAPILLARY: Glucose-Capillary: 175 mg/dL — ABNORMAL HIGH (ref 70–99)

## 2024-04-14 LAB — CBC WITH DIFFERENTIAL/PLATELET
Abs Immature Granulocytes: 0.6 K/uL — ABNORMAL HIGH (ref 0.00–0.07)
Basophils Absolute: 0 K/uL (ref 0.0–0.1)
Basophils Relative: 0 %
Eosinophils Absolute: 0 K/uL (ref 0.0–0.5)
Eosinophils Relative: 0 %
HCT: 19.7 % — ABNORMAL LOW (ref 39.0–52.0)
Hemoglobin: 6.3 g/dL — CL (ref 13.0–17.0)
Lymphocytes Relative: 6 %
Lymphs Abs: 1.7 K/uL (ref 0.7–4.0)
MCH: 36.2 pg — ABNORMAL HIGH (ref 26.0–34.0)
MCHC: 32 g/dL (ref 30.0–36.0)
MCV: 113.2 fL — ABNORMAL HIGH (ref 80.0–100.0)
Monocytes Absolute: 0 K/uL — ABNORMAL LOW (ref 0.1–1.0)
Monocytes Relative: 0 %
Myelocytes: 2 %
Neutro Abs: 25.9 K/uL — ABNORMAL HIGH (ref 1.7–7.7)
Neutrophils Relative %: 92 %
Platelets: 274 K/uL (ref 150–400)
RBC: 1.74 MIL/uL — ABNORMAL LOW (ref 4.22–5.81)
RDW: 20.1 % — ABNORMAL HIGH (ref 11.5–15.5)
Smear Review: NORMAL
WBC: 28.2 K/uL — ABNORMAL HIGH (ref 4.0–10.5)
nRBC: 1.3 % — ABNORMAL HIGH (ref 0.0–0.2)

## 2024-04-14 LAB — RESP PANEL BY RT-PCR (RSV, FLU A&B, COVID)  RVPGX2
Influenza A by PCR: NEGATIVE
Influenza B by PCR: NEGATIVE
Resp Syncytial Virus by PCR: NEGATIVE
SARS Coronavirus 2 by RT PCR: NEGATIVE

## 2024-04-14 LAB — COMPREHENSIVE METABOLIC PANEL WITH GFR
ALT: 13 U/L (ref 0–44)
AST: 26 U/L (ref 15–41)
Albumin: 2.6 g/dL — ABNORMAL LOW (ref 3.5–5.0)
Alkaline Phosphatase: 42 U/L (ref 38–126)
Anion gap: 13 (ref 5–15)
BUN: 72 mg/dL — ABNORMAL HIGH (ref 8–23)
CO2: 22 mmol/L (ref 22–32)
Calcium: 7.8 mg/dL — ABNORMAL LOW (ref 8.9–10.3)
Chloride: 101 mmol/L (ref 98–111)
Creatinine, Ser: 1.62 mg/dL — ABNORMAL HIGH (ref 0.61–1.24)
GFR, Estimated: 41 mL/min — ABNORMAL LOW (ref >60)
Glucose, Bld: 166 mg/dL — ABNORMAL HIGH (ref 70–99)
Potassium: 4.8 mmol/L (ref 3.5–5.1)
Sodium: 136 mmol/L (ref 135–145)
Total Bilirubin: 1.7 mg/dL — ABNORMAL HIGH (ref 0.0–1.2)
Total Protein: 4.7 g/dL — ABNORMAL LOW (ref 6.5–8.1)

## 2024-04-14 LAB — URINALYSIS, ROUTINE W REFLEX MICROSCOPIC
Bilirubin Urine: NEGATIVE
Glucose, UA: NEGATIVE mg/dL
Hgb urine dipstick: NEGATIVE
Ketones, ur: NEGATIVE mg/dL
Leukocytes,Ua: NEGATIVE
Nitrite: NEGATIVE
Protein, ur: NEGATIVE mg/dL
Specific Gravity, Urine: 1.02 (ref 1.005–1.030)
pH: 5 (ref 5.0–8.0)

## 2024-04-14 LAB — LIPASE, BLOOD: Lipase: 30 U/L (ref 11–51)

## 2024-04-14 LAB — LACTIC ACID, PLASMA
Lactic Acid, Venous: 3.9 mmol/L (ref 0.5–1.9)
Lactic Acid, Venous: 2.2 mmol/L (ref 0.5–1.9)

## 2024-04-14 LAB — PROTIME-INR
INR: 1.1 (ref 0.8–1.2)
Prothrombin Time: 15 s (ref 11.4–15.2)

## 2024-04-14 LAB — PREPARE RBC (CROSSMATCH)

## 2024-04-14 LAB — HEMOGLOBIN AND HEMATOCRIT, BLOOD
HCT: 22.4 % — ABNORMAL LOW (ref 39.0–52.0)
Hemoglobin: 7.3 g/dL — ABNORMAL LOW (ref 13.0–17.0)

## 2024-04-14 MED ORDER — SODIUM CHLORIDE 0.9 % IV SOLN
1.0000 g | Freq: Once | INTRAVENOUS | Status: AC
  Administered 2024-04-14: 1 g via INTRAVENOUS
  Filled 2024-04-14: qty 10

## 2024-04-14 MED ORDER — ONDANSETRON HCL 4 MG PO TABS: 4.0000 mg | ORAL_TABLET | Freq: Four times a day (QID) | ORAL | Status: DC | PRN

## 2024-04-14 MED ORDER — HYDROCORTISONE SOD SUC (PF) 100 MG IJ SOLR
40.0000 mg | Freq: Three times a day (TID) | INTRAMUSCULAR | Status: DC
  Administered 2024-04-14 – 2024-04-15 (×2): 40 mg via INTRAVENOUS
  Filled 2024-04-14 (×2): qty 2

## 2024-04-14 MED ORDER — ONDANSETRON HCL 4 MG/2ML IJ SOLN
4.0000 mg | Freq: Four times a day (QID) | INTRAMUSCULAR | Status: DC | PRN
  Administered 2024-04-15 – 2024-04-17 (×2): 4 mg via INTRAVENOUS
  Filled 2024-04-14 (×2): qty 2

## 2024-04-14 MED ORDER — MYCOPHENOLATE MOFETIL 250 MG PO CAPS
1000.0000 mg | ORAL_CAPSULE | Freq: Two times a day (BID) | ORAL | Status: DC
Start: 2024-04-14 — End: 2024-04-21
  Administered 2024-04-14 – 2024-04-21 (×13): 1000 mg via ORAL
  Filled 2024-04-14 (×15): qty 4

## 2024-04-14 MED ORDER — HYDROMORPHONE HCL 1 MG/ML IJ SOLN
0.5000 mg | Freq: Once | INTRAMUSCULAR | Status: AC
  Administered 2024-04-14: 0.5 mg via INTRAVENOUS
  Filled 2024-04-14: qty 0.5

## 2024-04-14 MED ORDER — SODIUM CHLORIDE 0.9% IV SOLUTION: Freq: Once | INTRAVENOUS | Status: AC

## 2024-04-14 MED ORDER — ACETAMINOPHEN 325 MG PO TABS
650.0000 mg | ORAL_TABLET | Freq: Four times a day (QID) | ORAL | Status: DC | PRN
  Administered 2024-04-16 – 2024-04-19 (×5): 650 mg via ORAL
  Filled 2024-04-14 (×5): qty 2

## 2024-04-14 MED ORDER — ACETAMINOPHEN 650 MG RE SUPP: 650.0000 mg | Freq: Four times a day (QID) | RECTAL | Status: DC | PRN

## 2024-04-14 MED ORDER — SODIUM CHLORIDE 0.9 % IV SOLN: INTRAVENOUS | Status: DC

## 2024-04-14 MED ORDER — SODIUM CHLORIDE 0.9% IV SOLUTION: Freq: Once | INTRAVENOUS | Status: DC

## 2024-04-14 MED ORDER — OXYCODONE HCL 5 MG PO TABS
5.0000 mg | ORAL_TABLET | Freq: Four times a day (QID) | ORAL | Status: DC | PRN
  Administered 2024-04-15: 10 mg via ORAL
  Administered 2024-04-16: 5 mg via ORAL
  Administered 2024-04-16: 10 mg via ORAL
  Administered 2024-04-17 – 2024-04-21 (×7): 5 mg via ORAL
  Filled 2024-04-14 (×4): qty 1
  Filled 2024-04-14: qty 2
  Filled 2024-04-14 (×5): qty 1
  Filled 2024-04-14: qty 2

## 2024-04-14 MED ORDER — PANTOPRAZOLE SODIUM 40 MG IV SOLR
40.0000 mg | Freq: Two times a day (BID) | INTRAVENOUS | Status: DC
  Administered 2024-04-14 – 2024-04-20 (×14): 40 mg via INTRAVENOUS
  Filled 2024-04-14 (×15): qty 10

## 2024-04-14 MED ORDER — CHLORHEXIDINE GLUCONATE CLOTH 2 % EX PADS
6.0000 | MEDICATED_PAD | Freq: Every day | CUTANEOUS | Status: DC
  Administered 2024-04-14 – 2024-04-20 (×6): 6 via TOPICAL

## 2024-04-14 MED ORDER — CALCIUM GLUCONATE-NACL 1-0.675 GM/50ML-% IV SOLN
1.0000 g | Freq: Once | INTRAVENOUS | Status: AC
Start: 2024-04-14 — End: 2024-04-14
  Administered 2024-04-14: 1000 mg via INTRAVENOUS
  Filled 2024-04-14: qty 50

## 2024-04-14 MED ORDER — SODIUM CHLORIDE 0.9 % IV SOLN
INTRAVENOUS | Status: DC
Start: 2024-04-14 — End: 2024-04-15

## 2024-04-14 NOTE — ED Notes (Signed)
 Pt in bed, family at bedside, pt awake and answering questions appropriately. Resps even and unlabored

## 2024-04-14 NOTE — Consult Note (Cosign Needed Addendum)
 Gastroenterology Consult   Referring Provider: Dr Melvenia, [facility] Primary Care Physician:  Anita Bernardino [name], FNP Primary Gastroenterologist:  Dr. Cindie, previously unassigned   Patient ID: MICKAEL MCNUTT; [id number]; July 27, 1934   Admit date: 04/14/2024  LOS: 0 days   Date of Consultation: 04/14/2024  Reason for Consultation:  Acute blood loss anemia, hematemesis.   History of Present Illness   Jeffrey Stewart is an 89 y.o. year old male with a history of chronic heart failure, CAD, HLD, ILD,  polymyalgia rheumatica and RA on chronic 6 mg prednisone  daily and chronic cellcept , pulmonary fibrosis, RBBB, mechanical fall last week and found to have right intertrochanteric femur fracture and underwent ORIF with intramedullary rod 04/07/24 at [facility], requiring 1 unit PRBCs while hospitalized due to acute blood loss anemia post procedure, sent home discharged 9/27 to home at family request instead of SNF, now presenting this morning to [facility] ED with acute episodes of hematemesis and melena. Hgb found to be 6.3, down from discharge of 7.1. GI consulted due to acute GI Bleed.    Initially, he was hypotensive with SBP in the 110s per EMS, which he notes his baseline, decreased to SBP in the 80s en route and currently 107/50 at time of consultation. He is receiving 1st unit of PRBCs at time of consultation. Wife at bedside.   In the ED: Hgb 6.3, down from 7.2. WBC count 28.2, platelets 274. Lactic acid 3.9. INR 1.1, BUN 72, Cr 1.62, Tbili 1.7, ASt 26, ALT 13, Alk Phos 42. Creatinine 1.62. blood cultures pending. CT pending.    Appears he has a chronic macrocytic anemia and fluctuating thrombocytopenia. Hgb baseline prior to fall around 10/12 range. He has seen Hematology for macrocytic anemia of unknown etiology, no B12 or folate deficiency, discussing bone marrow biopsy if worsening anemia. Known mild splenomegaly but normal-appearing liver on US  2023. No other recent imaging to compare. Denies  any known history of liver disease but did drink in the past, none in many years.    Today: he notes doing well since discharge until this morning when he woke up at 0500 with nausea and subsequently had 2 large episodes of hematemesis reported as dark with clots. Noted looser stool this morning that was dark. No abdominal pain. Denies any fever or chills. Intermittent GERD chronically but hasn't required any therapy. No PPI currently. Chronic prednisone . Denies any other NSAIDs other than 81 mg aspirin  BID since discharge from ORIF. No dysphagia. Poor appetite recently per wife. No known hx of PUD. No prior EGD that he is aware. No oxygen needed supplementally.   ECHO last in 2023 on file with EF 50%, severe left ventricular hypertrophy, no aortic stenosis  Colonoscopy 2005: small rectal polyp s/p removal, tiny proximal sigmoid polyp s/p cold biopsy, Path: benign colonic mucosa, no adenomatous change. Rectal polyp with fragments of tubular adenoma.   Hx of polyps in past.     Past Medical History:  Diagnosis Date   Allergy    Arthritis    MINOR ARTHRITIS FEET AND TOES   Asthma    Cataract    CHF (congestive heart failure) (HCC) 11/14/2009   EF45-50%   Coronary artery disease    Glaucoma (increased eye pressure) 04/06/2024   Hyperlipidemia    Myocardial infarction (HCC)    Pneumonia    HX OF PNEUMONIA 4 OR 5 YRS AGO   Rosacea    OF FACE   Shortness of breath  SOMETIMES SOB OR WHEEZING WITH EXERTION    Past Surgical History:  Procedure Laterality Date   APPENDECTOMY     AGE 25   CARDIAC CATHETERIZATION  11/2009   mild / mod cad,MODERATE TIGHT STENOSIS IN THE DISTAL LEFT CIRCUMFLEX ARTERY   ELECTROCARDIOGRAM  11/2009   RBBB   EYE SURGERY     BILATERAL CATARACT EXTRACT EXTRACTION WITH IMPLANTS   INGUINAL HERNIA REPAIR Bilateral 09/29/2013   Procedure: LAPAROSCOPIC BILATERAL  INGUINAL HERNIA;  Surgeon: Elon CHRISTELLA Pacini, MD;  Location: [facility];  Service: General;  Laterality:  Bilateral;   INSERTION OF MESH Bilateral 09/29/2013   Procedure: INSERTION OF MESH;  Surgeon: Elon CHRISTELLA Pacini, MD;  Location: [facility];  Service: General;  Laterality: Bilateral;   INTRAMEDULLARY (IM) NAIL INTERTROCHANTERIC Right 04/07/2024   Procedure: FIXATION, FRACTURE, INTERTROCHANTERIC, WITH INTRAMEDULLARY ROD;  Surgeon: Georgina Ozell LABOR, MD;  Location: [facility];  Service: Orthopedics;  Laterality: Right;   last echo 02/2009     last nuc 2009  12/16/2007   EF 33%. ABNORMAL. HE HAS APICAL DEFECT CONSISTENT WITH A SCAR.LV FUNCTION MODERATELY  DEPRESSED   US  ECHOCARDIOGRAPHY  02/23/2009   EF 45-50%   US  ECHOCARDIOGRAPHY  12/27/2007   EF 45-50%    Prior to Admission medications   Medication Sig Start Date End Date Taking? Authorizing Provider  acetaminophen  (TYLENOL ) 500 MG tablet Take 2 tablets (1,000 mg total) by mouth every 8 (eight) hours for 14 days. 04/09/24 04/23/24 Yes Georgina Ozell LABOR, MD  albuterol  (VENTOLIN  HFA) 108 [phone] Base) MCG/ACT inhaler Inhale 2 puffs into the lungs every 4 (four) hours as needed for wheezing or shortness of breath. 06/19/22  Yes Bernard Drivers, MD  aspirin  EC 81 MG tablet Take 1 tablet (81 mg total) by mouth 2 (two) times daily. Swallow whole. 04/09/24 05/21/24 Yes Georgina Ozell LABOR, MD  atorvastatin  (LIPITOR) 40 MG tablet TAKE 1 TABLET AT BEDTIME 08/01/23  Yes Nahser, Aleene PARAS, MD  brimonidine  (ALPHAGAN ) 0.2 % ophthalmic solution Place 1 drop into both eyes 2 (two) times daily. 07/20/21  Yes [provider]  carvedilol  (COREG ) 6.25 MG tablet Take 0.5 tablets (3.125 mg total) by mouth 2 (two) times daily with a meal. 04/12/24  Yes Darci Pore, MD  cetirizine (ZYRTEC) 10 MG tablet Take 10 mg by mouth at bedtime.   Yes [provider]  Cholecalciferol  (VITAMIN D3) 1000 units CAPS Take 1,000 Units by mouth daily.   Yes [provider]  ferrous sulfate 325 (65 FE) MG tablet Take 1 tablet (325 mg total) by mouth daily with breakfast. 04/12/24  04/12/25 Yes Darci Pore, MD  fish oil-omega-3 fatty acids  1000 MG capsule Take 1 g by mouth 2 (two) times daily.   Yes [provider]  fluticasone  (FLONASE ) 50 MCG/ACT nasal spray Place 1-2 sprays into both nostrils daily as needed for allergies or rhinitis.   Yes [provider]  fluticasone -salmeterol (ADVAIR) 100-50 MCG/ACT AEPB Inhale 1 puff into the lungs 2 (two) times daily.   Yes [provider]  glucosamine-chondroitin 500-400 MG tablet Take 1 tablet by mouth at bedtime.   Yes [provider]  ipratropium-albuterol  (DUONEB) 0.5-2.5 (3) MG/3ML SOLN Take 3 mLs by nebulization every 6 (six) hours as needed (wheezing, shortness of breath). 07/11/21  Yes [provider]  latanoprost  (XALATAN ) 0.005 % ophthalmic solution Place 1 drop into both eyes at bedtime. 06/27/21  Yes [provider]  loperamide (IMODIUM) 2 MG capsule Take 2 mg by mouth 2 (  two) times daily as needed for diarrhea or loose stools.   Yes [provider]  metroNIDAZOLE  (METROCREAM ) 0.75 % cream Apply 1 application  topically 2 (two) times daily as needed (for rosacea).   Yes [provider]  mycophenolate  (CELLCEPT ) 500 MG tablet TAKE 1 TABLET BY MOUTH TWICE A DAY Patient taking differently: Take 1,000 mg by mouth 2 (two) times daily. 09/17/23  Yes Mannam, Praveen, MD  NEOMYCIN -POLYMYXIN-HYDROCORTISONE  (CORTISPORIN) 1 % SOLN OTIC solution Place 1 drop into both ears as needed (when ears are stopped up). 05/13/19  Yes [provider]  oxyCODONE  (OXY IR/ROXICODONE ) 5 MG immediate release tablet Take 1-2 tablets (5-10 mg total) by mouth every 4 (four) hours as needed for up to 7 days for moderate pain (pain score 4-6) or severe pain (pain score 7-10). 04/09/24 04/16/24 Yes Georgina Ozell LABOR, MD  Polyethyl Glycol-Propyl Glycol 0.4-0.3 % SOLN Place 1 drop into both eyes as needed.   Yes [provider]  potassium chloride  SA (KLOR-CON  M) 20  MEQ tablet Take 1 tablet (20 mEq total) by mouth daily. 03/18/24  Yes Campbell, Kenzie E, NP  predniSONE  (DELTASONE ) 1 MG tablet Take 1 mg by mouth at bedtime. 05/19/23  Yes [provider]  predniSONE  (DELTASONE ) 5 MG tablet Take 5 mg by mouth daily with breakfast.   Yes [provider]  sodium chloride  (OCEAN) 0.65 % nasal spray Place 1 spray into the nose as needed for congestion.   Yes [provider]  torsemide  (DEMADEX ) 20 MG tablet Take 1 tablet (20 mg total) by mouth daily. 01/31/24  Yes Nahser, Aleene PARAS, MD  polyethylene glycol powder (GLYCOLAX /MIRALAX ) 17 GM/SCOOP powder Take 1 capful (17 g) with water by mouth daily for 14 days. Dissolve 1 capful (17g) in 4-8 ounces of liquid and take by mouth daily. Patient not taking: Reported on 04/14/2024 04/09/24 04/23/24  Georgina Ozell LABOR, MD  senna (SENOKOT) 8.6 MG TABS tablet Take 1 tablet (8.6 mg total) by mouth 2 (two) times daily for 14 days. Patient not taking: Reported on 04/14/2024 04/09/24 04/23/24  Georgina Ozell LABOR, MD    Current Facility-Administered Medications  Medication Dose Route Frequency Provider Last Rate Last Admin   0.9 %  sodium chloride  infusion (Manually program via Guardrails IV Fluids)   Intravenous Once Melvenia Motto, MD       0.9 %  sodium chloride  infusion   Intravenous Continuous Melvenia Motto, MD 100 mL/hr at 04/14/24 1453 New Bag at 04/14/24 1453   calcium  gluconate 1 g/ 50 mL sodium chloride  IVPB  1 g Intravenous Once Melvenia Motto, MD 50 mL/hr at 04/14/24 1549 1,000 mg at 04/14/24 1549   pantoprazole (PROTONIX) injection 40 mg  40 mg Intravenous Q12H Melvenia Motto, MD   40 mg at 04/14/24 1453   Current Outpatient Medications  Medication Sig Dispense Refill   acetaminophen  (TYLENOL ) 500 MG tablet Take 2 tablets (1,000 mg total) by mouth every 8 (eight) hours for 14 days. 84 tablet 0   albuterol  (VENTOLIN  HFA) 108 (90 Base) MCG/ACT inhaler Inhale 2 puffs into the lungs every 4 (four) hours as needed for  wheezing or shortness of breath. 1 each 1   aspirin  EC 81 MG tablet Take 1 tablet (81 mg total) by mouth 2 (two) times daily. Swallow whole. 84 tablet 0   atorvastatin  (LIPITOR) 40 MG tablet TAKE 1 TABLET AT BEDTIME 90 tablet 3   brimonidine  (ALPHAGAN ) 0.2 % ophthalmic solution Place 1 drop into both eyes 2 (  two) times daily.     carvedilol  (COREG ) 6.25 MG tablet Take 0.5 tablets (3.125 mg total) by mouth 2 (two) times daily with a meal.     cetirizine (ZYRTEC) 10 MG tablet Take 10 mg by mouth at bedtime.     Cholecalciferol  (VITAMIN D3) 1000 units CAPS Take 1,000 Units by mouth daily.     ferrous sulfate 325 (65 FE) MG tablet Take 1 tablet (325 mg total) by mouth daily with breakfast. 60 tablet 1   fish oil-omega-3 fatty acids  1000 MG capsule Take 1 g by mouth 2 (two) times daily.     fluticasone  (FLONASE ) 50 MCG/ACT nasal spray Place 1-2 sprays into both nostrils daily as needed for allergies or rhinitis.     fluticasone -salmeterol (ADVAIR) 100-50 MCG/ACT AEPB Inhale 1 puff into the lungs 2 (two) times daily.     glucosamine-chondroitin 500-400 MG tablet Take 1 tablet by mouth at bedtime.     ipratropium-albuterol  (DUONEB) 0.5-2.5 (3) MG/3ML SOLN Take 3 mLs by nebulization every 6 (six) hours as needed (wheezing, shortness of breath).     latanoprost  (XALATAN ) 0.005 % ophthalmic solution Place 1 drop into both eyes at bedtime.     loperamide (IMODIUM) 2 MG capsule Take 2 mg by mouth 2 (two) times daily as needed for diarrhea or loose stools.     metroNIDAZOLE  (METROCREAM ) 0.75 % cream Apply 1 application  topically 2 (two) times daily as needed (for rosacea).     mycophenolate  (CELLCEPT ) 500 MG tablet TAKE 1 TABLET BY MOUTH TWICE A DAY (Patient taking differently: Take 1,000 mg by mouth 2 (two) times daily.) 180 tablet 1   NEOMYCIN -POLYMYXIN-HYDROCORTISONE  (CORTISPORIN) 1 % SOLN OTIC solution Place 1 drop into both ears as needed (when ears are stopped up).     oxyCODONE  (OXY IR/ROXICODONE ) 5  MG immediate release tablet Take 1-2 tablets (5-10 mg total) by mouth every 4 (four) hours as needed for up to 7 days for moderate pain (pain score 4-6) or severe pain (pain score 7-10). 30 tablet 0   Polyethyl Glycol-Propyl Glycol 0.4-0.3 % SOLN Place 1 drop into both eyes as needed.     potassium chloride  SA (KLOR-CON  M) 20 MEQ tablet Take 1 tablet (20 mEq total) by mouth daily. 90 tablet 3   predniSONE  (DELTASONE ) 1 MG tablet Take 1 mg by mouth at bedtime.     predniSONE  (DELTASONE ) 5 MG tablet Take 5 mg by mouth daily with breakfast.     sodium chloride  (OCEAN) 0.65 % nasal spray Place 1 spray into the nose as needed for congestion.     torsemide  (DEMADEX ) 20 MG tablet Take 1 tablet (20 mg total) by mouth daily. 90 tablet 1   polyethylene glycol powder (GLYCOLAX /MIRALAX ) 17 GM/SCOOP powder Take 1 capful (17 g) with water by mouth daily for 14 days. Dissolve 1 capful (17g) in 4-8 ounces of liquid and take by mouth daily. (Patient not taking: Reported on 04/14/2024) 238 g 0   senna (SENOKOT) 8.6 MG TABS tablet Take 1 tablet (8.6 mg total) by mouth 2 (two) times daily for 14 days. (Patient not taking: Reported on 04/14/2024) 28 tablet 0    Allergies as of 04/14/2024 - Review Complete 04/14/2024  Allergen Reaction Noted   Niaspan  [niacin ] Other (See Comments) 05/08/2011   Meloxicam Other (See Comments) 12/20/2020    Family History  Problem Relation Age of Onset   Diabetes Sister    Lung cancer Sister        smoker  Social History   Socioeconomic History   Marital status: Married    Spouse name: Not on file   Number of children: Not on file   Years of education: Not on file   Highest education level: Not on file  Occupational History   Not on file  Tobacco Use   Smoking status: Never   Smokeless tobacco: Never  Vaping Use   Vaping status: Never Used  Substance and Sexual Activity   Alcohol  use: Not Currently    Comment: 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   Drug use: No   Sexual  activity: Not on file  Other Topics Concern   Not on file  Social History Narrative   Not on file   Social Drivers of Health   Financial Resource Strain: Not on file  Food Insecurity: Low Risk  (04/14/2024)   Received from [facility]   Hunger Vital Sign    Within the past 12 months, you worried that your food would run out before you got money to buy more: Never true    Within the past 12 months, the food you bought just didn't last and you didn't have money to get more. : Never true  Transportation Needs: No Transportation Needs (04/14/2024)   Received from [facility]    In the past 12 months, has lack of reliable transportation kept you from medical appointments, meetings, work or from getting things needed for daily living? : No  Physical Activity: Not on file  Stress: Not on file  Social Connections: Moderately Integrated (04/07/2024)   Social Connection and Isolation Panel    Frequency of Communication with Friends and Family: More than three times a week    Frequency of Social Gatherings with Friends and Family: More than three times a week    Attends Religious Services: 1 to 4 times per year    Active Member of [employer] or Organizations: No    Attends Banker Meetings: Never    Marital Status: Married  Catering manager Violence: Not At Risk (04/07/2024)   Humiliation, Afraid, Rape, and Kick questionnaire    Fear of Current or Ex-Partner: No    Emotionally Abused: No    Physically Abused: No    Sexually Abused: No     Review of Systems   Gen: Denies any fever, chills, loss of appetite, change in weight or weight loss CV: Denies chest pain, heart palpitations, syncope, edema  Resp: Denies shortness of breath with rest, cough, wheezing, coughing up blood, and pleurisy. GI: see HPI GU : Denies urinary burning, blood in urine, urinary frequency, and urinary incontinence. MS: Denies joint pain, limitation of movement, swelling, cramps, and  atrophy.  Derm: Denies rash, itching, dry skin, hives. Psych: Denies depression, anxiety, memory loss, hallucinations, and confusion. Heme: Denies bruising or bleeding Neuro:  Denies any headaches, dizziness, paresthesias, shaking  Physical Exam   Vital Signs in last 24 hours: Temp:  [97.9 F (36.6 C)-98.9 F (37.2 C)] 98.9 F (37.2 C) (09/29 1542) Pulse Rate:  [94-113] 104 (09/29 1542) Resp:  [23-35] 24 (09/29 1542) BP: (101-113)/(48-57) 108/57 (09/29 1542) SpO2:  [92 %-98 %] 94 % (09/29 1542) Weight:  [88 kg] 88 kg (09/29 1359)    General:   Alert,  chronically ill-appearing Head:  Normocephalic and atraumatic. Eyes:  Sclera clear, no icterus.   Ears:  Normal auditory acuity. Mouth:  edentulous Lungs:  Clear throughout to auscultation.    Heart:  S1 S2,  regular on telemetry, no obvious murmur Abdomen:  Soft, obese, nontender and nondistended. No masses, hepatosplenomegaly or hernias noted. Normal bowel sounds, without guarding, and without rebound.   Rectal: deferred   Msk:  Symmetrical without gross deformities. Normal posture. Extremities:  With chronic venous stasis changes, warm to touch Neurologic:  Alert and  oriented x4. Skin:  Intact without significant lesions or rashes. Psych:  Alert and cooperative. Normal mood and affect.  Intake/Output from previous day: No intake/output data recorded. Intake/Output this shift: No intake/output data recorded.  Labs/Studies   Recent Labs Recent Labs    04/12/24 0618 04/14/24 1422  WBC 8.8 28.2*  HGB 7.1* 6.3*  HCT 21.6* 19.7*  PLT 132* 274   BMET Recent Labs    04/12/24 0618 04/14/24 1422  NA 137 136  K 4.8 4.8  CL 100 101  CO2 29 22  GLUCOSE 121* 166*  BUN 40* 72*  CREATININE 1.20 1.62*  CALCIUM  8.4* 7.8*   LFT Recent Labs    04/14/24 1422  PROT 4.7*  ALBUMIN  2.6*  AST 26  ALT 13  ALKPHOS 42  BILITOT 1.7*   PT/INR Recent Labs    04/14/24 1422  LABPROT 15.0  INR 1.1      Assessment    TAHJ NJOKU is an 89 y.o. year old male with a history of chronic heart failure, CAD, HLD, ILD,  polymyalgia rheumatica and RA on chronic 6 mg prednisone  daily and chronic cellcept , pulmonary fibrosis, RBBB, mechanical fall last week and found to have right intertrochanteric femur fracture and underwent ORIF with intramedullary rod 04/07/24 at [facility], requiring 1 unit PRBCs while hospitalized due to acute blood loss anemia post procedure,  discharged 9/27 to home at family request instead of SNF, now presenting this morning to [facility] ED with acute episodes of hematemesis and melena. Hgb found to be 6.3, down from discharge of 7.1. GI consulted due to acute GI Bleed.   Acute on chronic anemia: in setting of hematemesis.  Hgb 6.3, down from 7.2 at discharge several days ago. Hx of chronic macrocytic anemia and fluctuating thrombocytopenia, previously evaluated by Hematology with plans for bone marrow biopsy if worsening anemia. Baseline Hgb 10-12 range. Splenomegaly in the past but liver appeared normal on imaging in 2023. Denies any hx of liver disease that he is aware. In setting of chronic prednisone , cellcept , increased 81 mg aspirin  to BID, recent acute hospitalization, suspect possible PUD. As unsure if he has any underlying liver disease, await CT findings and low threshold for starting abx and octreotide if evidence for cirrhosis. Agree with transfusion and plan on EGD tomorrow.   Leukocytosis: WBC count 28.2, afebrile, blood cultures pending. CT pending and CXR. Partly reactive. Awaiting imaging and blood cultures.   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       Plan / Recommendations    Agree with stat CT Agree with IV PPI BID If any evidence for cirrhosis on imaging, will start empiric IV antibiotics and sandostatin out of abundance of caution NPO Continue supportive measures, transfusion Anticipate EGD tomorrow barring any  acute findings on CT     04/14/2024, 4:24 PM  Therisa MICAEL Stager, PhD, ANP-BC [facility] Gastroenterology   Addendum: CT abd/pelvis without contrast without obvious liver disease. Spleen reported as normal. Loose stool throughout colon. No active inflammation. Can check stool studies if diarrhea but suspect moreso had laxative effect in light of acute GI bleeding. CXR  with bibasilar atelectasis.   NPO after midnight except sips with meds. Plan on EGD 9/30.   Therisa MICAEL Stager, PhD, ANP-BC [facility] Gastroenterology

## 2024-04-14 NOTE — ED Notes (Signed)
 Pt in bed, pt states that he is starting to feel a little bit better, denies itchiness or swelling in throat. No rashes noted

## 2024-04-14 NOTE — Progress Notes (Signed)
 [facility] Transitional Care Management     Situation   Jeffrey Stewart is a 89 y.o. male who was contacted today for a transitional care outreach.  Admission Date: 04/06/2024  Discharge Date: 04/12/2024   Institution: [facility]  Diagnosis:  displaced intertrochanteric fracture of the right femur  Is this visit eligible for TCM? Yes  Background   Since Discharge: HN phone call with pt's spouse. She reports pt is doing okay. Pt is using a walker. He is able to get up and move around and his spouse and daughters are there to help. Pt is nauseated today. Pt has thrown up this morning but he is eating crackers now to settle his stomach. Pt's wife states since he threw up pt has color to him and looks better. PT's wife states some thing was in the vomit that looked like blood but was ball shaped. They put it in a bag to bring to the office tomorrow. Pt passed blood when he used the bathroom per wife. Wife asked pt about it and he states it is an old hemorrhoid that he has had for years. Pt has seen it bleed before. They will monitor it. Pt has an appt with PCP tomorrow. HN and wife went over medications. Pt is now on ASA BID per d/c. Pt is not taking the narcotics and is taking tylenol  instead. Wife has not heard from PT yet. She is aware they should be calling. HN has advised that they take pt to the hospital if he vomits again or if he appears to decline. Pt is currently talking to wife/Hn in the background and states he feels some better. HN reminded wife that calling 911 would allow paramedics to come and assess pt. She is agreeable to call if he has any further issues. Pt's daughters are also in the home with her. They do plan to keep appt at the office if pt continues to do well today. HN will continue with outreach.   I reviewed current EMR med list and facility discharge medications. Please see note in EMR med list for findings.    Primary Care Provider on Record: Bernardino JAYSON Level,  FNP   Assessment    General Assessment     Type of Visit Telephone   Assessment Completed With Patient   Interpreter Used No   Preferred Language English   Appt Made 04/15/24   Living Arrangement Spouse   Support System Family   Type of Residence Private residence   Home Care Services Yes   Does Patient Have DME Yes   DME List Bedside commode; Walker   Does Patient Need DME No   Fall History Yes   Diet Regular   Inadequate Nutrition No   Medication Adherence Problem No   Medication Side Effects No   Difficulty Keeping Appointments No      Flowsheet Row Most Recent Value  Engagement   Call number 1  Two calls made/Contact successful Yes  Is the patient eligible? Yes  Medications   Appointments   Self Management   Patient Teaching   Wrap Up   Two calls made/Contact successful Yes   List of Assessments:  SDOH/COMMUNITY RESOURCES:    Current Resources: None Immediate needs or current concerns: None                 Recommendation    PCP/specialist notified: Yes  Referral Made: No  Referrals made to other disciplines: None   Future Appointments  Date Time Provider Department Center  04/15/2024 10:20 AM Bernardino JAYSON Level, FNP [facility] [address]   06/04/2024  8:00 AM Bernardino JAYSON Level, FNP [facility] [address]      Augustin Saupe, RN, [facility] [employer] [phone]    Electronically signed by: Augustin Saupe, RN 04/14/2024 10:01 AM

## 2024-04-14 NOTE — ED Triage Notes (Signed)
 Pt in by [facility] from home. C/o of bloody emesis and blood in stool. Pt had a few episodes of diarrhea and has thrown up blood clots.

## 2024-04-14 NOTE — H&P (Addendum)
 History and Physical    Jeffrey Stewart FMW:[id number] DOB: [birth year] DOA: 04/14/2024  PCP: Anita Bernardino [name], FNP   Patient coming from: Home  I have personally briefly reviewed patient's old medical records in [facility]  Chief Complaint: Vomiting blood, bloody stools  HPI: Jeffrey Stewart is a 89 y.o. male with medical history significant for CHF, interstitial lung disease, RBBB, rheumatoid arthritis. Patient was brought to the ED via EMS with reports of vomiting of blood, and blood in his stools that started this morning.  Spouse is at bedside and confirms that patients vomitus contained golf sized blood clots.  He is not aware of having any black stools.  Reports some lower abdominal pain earlier today that has since resolved.  He has history of hemorrhoids which occasionally bleed, but not to this extent, he has never vomited blood.  He denies NSAID use.  EMS reports blood pressure transiently down to 80s and route.  Recently hospitalized 9/29 to 9/27 for hip fracture-underwent ORIF 03/2021.  Also with acute blood loss anemia postprocedure, requiring 1 unit PRBC.  Hemoglobin on discharge was 7.1.  Refused SNF and was discharged to home.  He is not on anticoagulation.  ED Course: Temperature 98.2.  Tachycardic heart rate 94-116.  Respiratory rate 18- 35.  Blood pressure systolic 94-115.  O2 sat greater than 92% on room air. Hemoglobin 6.3.  WBC 28.2.  Lactic acid 3.9. CTAP W OC-diarrheal state, correlation with clinical exam and stool cultures recommended. EDP talked to Dr. Cindie, n.p.o., IV PPI twice daily, plan for EGD 9/30.  Review of Systems: As per HPI all other systems reviewed and negative.  Past Medical History:  Diagnosis Date   Allergy    Arthritis    MINOR ARTHRITIS FEET AND TOES   Asthma    Cataract    CHF (congestive heart failure) (HCC) 11/14/2009   EF45-50%   Coronary artery disease    Glaucoma (increased eye pressure) 04/06/2024   Hyperlipidemia     Myocardial infarction (HCC)    Pneumonia    HX OF PNEUMONIA 4 OR 5 YRS AGO   Rosacea    OF FACE   Shortness of breath    SOMETIMES SOB OR WHEEZING WITH EXERTION    Past Surgical History:  Procedure Laterality Date   APPENDECTOMY     AGE 68   CARDIAC CATHETERIZATION  11/2009   mild / mod cad,MODERATE TIGHT STENOSIS IN THE DISTAL LEFT CIRCUMFLEX ARTERY   ELECTROCARDIOGRAM  11/2009   RBBB   EYE SURGERY     BILATERAL CATARACT EXTRACT EXTRACTION WITH IMPLANTS   INGUINAL HERNIA REPAIR Bilateral 09/29/2013   Procedure: LAPAROSCOPIC BILATERAL  INGUINAL HERNIA;  Surgeon: Elon CHRISTELLA Pacini, MD;  Location: [facility];  Service: General;  Laterality: Bilateral;   INSERTION OF MESH Bilateral 09/29/2013   Procedure: INSERTION OF MESH;  Surgeon: Elon CHRISTELLA Pacini, MD;  Location: [facility];  Service: General;  Laterality: Bilateral;   INTRAMEDULLARY (IM) NAIL INTERTROCHANTERIC Right 04/07/2024   Procedure: FIXATION, FRACTURE, INTERTROCHANTERIC, WITH INTRAMEDULLARY ROD;  Surgeon: Georgina Ozell DELENA, MD;  Location: [facility] OR;  Service: Orthopedics;  Laterality: Right;   last echo 02/2009     last nuc 2009  12/16/2007   EF 33%. ABNORMAL. HE HAS APICAL DEFECT CONSISTENT WITH A SCAR.LV FUNCTION MODERATELY  DEPRESSED   US  ECHOCARDIOGRAPHY  02/23/2009   EF 45-50%   US  ECHOCARDIOGRAPHY  12/27/2007   EF 45-50%     reports that he has never smoked.  He has never used smokeless tobacco. He reports that he does not currently use alcohol . He reports that he does not use drugs.  Allergies  Allergen Reactions   Niaspan  [Niacin ] Other (See Comments)    Intolerance flush feeling when on large dose - is able to take smaller dosage without problem    Meloxicam Other (See Comments)    Disorientation     Family History  Problem Relation Age of Onset   Diabetes Sister    Lung cancer Sister        smoker    Prior to Admission medications   Medication Sig Start Date End Date Taking? Authorizing Provider  acetaminophen  (TYLENOL )  500 MG tablet Take 2 tablets (1,000 mg total) by mouth every 8 (eight) hours for 14 days. 04/09/24 04/23/24 Yes Georgina Ozell LABOR, MD  albuterol  (VENTOLIN  HFA) 108 [phone] Base) MCG/ACT inhaler Inhale 2 puffs into the lungs every 4 (four) hours as needed for wheezing or shortness of breath. 06/19/22  Yes Bernard Drivers, MD  aspirin  EC 81 MG tablet Take 1 tablet (81 mg total) by mouth 2 (two) times daily. Swallow whole. 04/09/24 05/21/24 Yes Georgina Ozell LABOR, MD  atorvastatin  (LIPITOR) 40 MG tablet TAKE 1 TABLET AT BEDTIME 08/01/23  Yes Nahser, Aleene PARAS, MD  brimonidine  (ALPHAGAN ) 0.2 % ophthalmic solution Place 1 drop into both eyes 2 (two) times daily. 07/20/21  Yes [provider]  carvedilol  (COREG ) 6.25 MG tablet Take 0.5 tablets (3.125 mg total) by mouth 2 (two) times daily with a meal. 04/12/24  Yes Darci Pore, MD  cetirizine (ZYRTEC) 10 MG tablet Take 10 mg by mouth at bedtime.   Yes [provider]  Cholecalciferol  (VITAMIN D3) 1000 units CAPS Take 1,000 Units by mouth daily.   Yes [provider]  ferrous sulfate 325 (65 FE) MG tablet Take 1 tablet (325 mg total) by mouth daily with breakfast. 04/12/24 04/12/25 Yes Darci Pore, MD  fish oil-omega-3 fatty acids  1000 MG capsule Take 1 g by mouth 2 (two) times daily.   Yes [provider]  fluticasone  (FLONASE ) 50 MCG/ACT nasal spray Place 1-2 sprays into both nostrils daily as needed for allergies or rhinitis.   Yes [provider]  fluticasone -salmeterol (ADVAIR) 100-50 MCG/ACT AEPB Inhale 1 puff into the lungs 2 (two) times daily.   Yes [provider]  glucosamine-chondroitin 500-400 MG tablet Take 1 tablet by mouth at bedtime.   Yes [provider]  ipratropium-albuterol  (DUONEB) 0.5-2.5 (3) MG/3ML SOLN Take 3 mLs by nebulization every 6 (six) hours as needed (wheezing, shortness of breath). 07/11/21  Yes [provider]  latanoprost  (XALATAN ) 0.005 % ophthalmic  solution Place 1 drop into both eyes at bedtime. 06/27/21  Yes [provider]  loperamide (IMODIUM) 2 MG capsule Take 2 mg by mouth 2 (two) times daily as needed for diarrhea or loose stools.   Yes [provider]  metroNIDAZOLE  (METROCREAM ) 0.75 % cream Apply 1 application  topically 2 (two) times daily as needed (for rosacea).   Yes [provider]  mycophenolate  (CELLCEPT ) 500 MG tablet TAKE 1 TABLET BY MOUTH TWICE A DAY Patient taking differently: Take 1,000 mg by mouth 2 (two) times daily. 09/17/23  Yes Mannam, Praveen, MD  NEOMYCIN -POLYMYXIN-HYDROCORTISONE  (CORTISPORIN) 1 % SOLN OTIC solution Place 1 drop into both ears as needed (when ears are stopped up). 05/13/19  Yes [provider]  oxyCODONE  (OXY IR/ROXICODONE ) 5 MG immediate release tablet Take 1-2 tablets (5-10 mg total) by  mouth every 4 (four) hours as needed for up to 7 days for moderate pain (pain score 4-6) or severe pain (pain score 7-10). 04/09/24 04/16/24 Yes Georgina Ozell LABOR, MD  Polyethyl Glycol-Propyl Glycol 0.4-0.3 % SOLN Place 1 drop into both eyes as needed.   Yes [provider]  potassium chloride  SA (KLOR-CON  M) 20 MEQ tablet Take 1 tablet (20 mEq total) by mouth daily. 03/18/24  Yes Campbell, Kenzie E, NP  predniSONE  (DELTASONE ) 1 MG tablet Take 1 mg by mouth at bedtime. 05/19/23  Yes [provider]  predniSONE  (DELTASONE ) 5 MG tablet Take 5 mg by mouth daily with breakfast.   Yes [provider]  sodium chloride  (OCEAN) 0.65 % nasal spray Place 1 spray into the nose as needed for congestion.   Yes [provider]  torsemide  (DEMADEX ) 20 MG tablet Take 1 tablet (20 mg total) by mouth daily. 01/31/24  Yes Nahser, Aleene PARAS, MD  polyethylene glycol powder (GLYCOLAX /MIRALAX ) 17 GM/SCOOP powder Take 1 capful (17 g) with water by mouth daily for 14 days. Dissolve 1 capful (17g) in 4-8 ounces of liquid and take by mouth daily. Patient not taking: Reported on  04/14/2024 04/09/24 04/23/24  Georgina Ozell LABOR, MD  senna (SENOKOT) 8.6 MG TABS tablet Take 1 tablet (8.6 mg total) by mouth 2 (two) times daily for 14 days. Patient not taking: Reported on 04/14/2024 04/09/24 04/23/24  Georgina Ozell LABOR, MD    Physical Exam: Vitals:   04/14/24 1625 04/14/24 1627 04/14/24 1642 04/14/24 1815  BP: (!) 107/50 (!) 94/59 (!) 109/50 101/69  Pulse: 95 (!) 107 (!) 106 (!) 107  Resp: 20 18 (!) 22 (!) 22  Temp: 98.6 F (37 C) 98.6 F (37 C) 98.4 F (36.9 C) 98.8 F (37.1 C)  TempSrc: Oral Oral Oral Oral  SpO2: 94%  99% 97%  Weight:      Height:        Constitutional: NAD, calm, comfortable Vitals:   04/14/24 1625 04/14/24 1627 04/14/24 1642 04/14/24 1815  BP: (!) 107/50 (!) 94/59 (!) 109/50 101/69  Pulse: 95 (!) 107 (!) 106 (!) 107  Resp: 20 18 (!) 22 (!) 22  Temp: 98.6 F (37 C) 98.6 F (37 C) 98.4 F (36.9 C) 98.8 F (37.1 C)  TempSrc: Oral Oral Oral Oral  SpO2: 94%  99% 97%  Weight:      Height:       Eyes: PERRL, lids and conjunctivae normal ENMT: Mucous membranes are moist.   Neck: normal, supple, no masses, no thyromegaly Respiratory: clear to auscultation bilaterally, no wheezing, no crackles. Normal respiratory effort. No accessory muscle use.  Cardiovascular: Regular rate and rhythm, no murmurs / rubs / gallops. Trace bilateral extremity edema, chronic and stable's.  Extensive chronic discoloration to bilateral lower extremities at baseline. Abdomen: no tenderness, no masses palpated. No hepatosplenomegaly. Bowel sounds positive.  Musculoskeletal: no clubbing / cyanosis. No joint deformity upper and lower extremities. Good ROM, no contractures. Normal muscle tone.  Skin: Discoloration to bilateral lower extremities.   Evaluation in the presence of chaperone, Nurse- Caron, - stage I sacral decubitus ulcer, with rare ulcerations, no fluctuance no induration no drainage.   Patient reported bruising to his scrotum from urinal while he was in the  hospital, he has some discoloration to left scrotum, some mild swelling. Extensive discoloration/ecchymosis to right thigh from recent surgery.  Neurologic: No facial asymmetry, moving extremity spontaneously, equal grip strength bilaterally.SABRA  Psychiatric: Normal judgment and insight. Alert  and oriented x 3. Normal mood.        Labs on Admission: I have personally reviewed following labs and imaging studies  CBC: Recent Labs  Lab 04/09/24 0437 04/09/24 1127 04/10/24 0519 04/11/24 0406 04/12/24 0618 04/14/24 1422  WBC 12.3*  --  8.9 8.4 8.8 28.2*  NEUTROABS  --   --   --   --   --  25.9*  HGB 6.9* 7.9* 7.2* 7.1* 7.1* 6.3*  HCT 20.4* 23.3* 21.4* 21.1* 21.6* 19.7*  MCV 106.3*  --  104.9* 106.0* 106.9* 113.2*  PLT 106*  --  104* 109* 132* 274   Basic Metabolic Panel: Recent Labs  Lab 04/11/24 0406 04/12/24 0618 04/14/24 1422  NA 137 137 136  K 4.7 4.8 4.8  CL 103 100 101  CO2 28 29 22   GLUCOSE 125* 121* 166*  BUN 48* 40* 72*  CREATININE 1.23 1.20 1.62*  CALCIUM  8.2* 8.4* 7.8*   GFR: Estimated Creatinine Clearance: 35.3 mL/min (A) (by C-G formula based on SCr of 1.62 mg/dL (H)). Liver Function Tests: Recent Labs  Lab 04/14/24 1422  AST 26  ALT 13  ALKPHOS 42  BILITOT 1.7*  PROT 4.7*  ALBUMIN  2.6*   Recent Labs  Lab 04/14/24 1422  LIPASE 30   Coagulation Profile: Recent Labs  Lab 04/14/24 1422  INR 1.1   Anemia Panel: Recent Labs    04/12/24 0618  VITAMINB12 775  FOLATE >20.0  FERRITIN 88  TIBC 291  IRON 54  RETICCTPCT 4.3*   Urine analysis:    Component Value Date/Time   COLORURINE YELLOW 09/07/2023 0615   APPEARANCEUR HAZY (A) 09/07/2023 0615   LABSPEC 1.023 09/07/2023 0615   PHURINE 5.0 09/07/2023 0615   GLUCOSEU NEGATIVE 09/07/2023 0615   HGBUR NEGATIVE 09/07/2023 0615   BILIRUBINUR NEGATIVE 09/07/2023 0615   KETONESUR NEGATIVE 09/07/2023 0615   PROTEINUR NEGATIVE 09/07/2023 0615   UROBILINOGEN 1.0 09/24/2013 0905   NITRITE  NEGATIVE 09/07/2023 0615   LEUKOCYTESUR NEGATIVE 09/07/2023 0615    Radiological Exams on Admission: DG Chest Port 1 View Result Date: 04/14/2024 CLINICAL DATA:  Questionable sepsis. EXAM: PORTABLE CHEST 1 VIEW COMPARISON:  Chest radiograph dated 04/06/2024 FINDINGS: Shallow inspiration with bibasilar atelectasis. No consolidative changes. No pleural effusion pneumothorax. Stable cardiac silhouette. No acute osseous pathology. IMPRESSION: Shallow inspiration with bibasilar atelectasis. No focal consolidation. Electronically Signed   By: Vanetta Chou M.D.   On: 04/14/2024 17:30   CT ABDOMEN PELVIS WO CONTRAST Result Date: 04/14/2024 CLINICAL DATA:  Abdominal pain.  Hematemesis. EXAM: CT ABDOMEN AND PELVIS WITHOUT CONTRAST TECHNIQUE: Multidetector CT imaging of the abdomen and pelvis was performed following the standard protocol without IV contrast. RADIATION DOSE REDUCTION: This exam was performed according to the departmental dose-optimization program which includes automated exposure control, adjustment of the mA and/or kV according to patient size and/or use of iterative reconstruction technique. COMPARISON:  Abdominal ultrasound dated 11/04/2021. FINDINGS: Evaluation of this exam is limited in the absence of intravenous contrast. Lower chest: Bibasilar subpleural atelectasis/scarring. There is coronary vascular calcification. No intra-abdominal free air or free fluid. Hepatobiliary: The liver is unremarkable. No biliary dilatation. The gallbladder is unremarkable. Pancreas: Unremarkable. No pancreatic ductal dilatation or surrounding inflammatory changes. Spleen: Normal in size without focal abnormality. Adrenals/Urinary Tract: The adrenal glands unremarkable. There is no hydronephrosis or nephrolithiasis on either side. Bilateral perinephric stranding, nonspecific. Correlation with urinalysis recommended to exclude UTI. The visualized ureters and urinary bladder appear unremarkable. Stomach/Bowel:  There is loose stool  throughout the colon suggestive of diarrheal state. Correlation with clinical exam and stool cultures recommended. There is no bowel obstruction or active inflammation. Appendectomy. Vascular/Lymphatic: Moderate aortoiliac atherosclerotic disease. The IVC is unremarkable. No portal venous gas. There is no adenopathy. Reproductive: The prostate and seminal vesicles are grossly unremarkable. Other: Small fat containing bilateral inguinal hernias. Anterior pelvic wall hernia repair mesh. Musculoskeletal: Osteopenia with degenerative changes of the spine. Status post ORIF of right femoral neck fracture. There is edema in the surrounding soft tissues of the right hip. No fluid collection. IMPRESSION: 1. Diarrheal state. Correlation with clinical exam and stool cultures recommended. No bowel obstruction. 2. No hydronephrosis or nephrolithiasis. 3.  Aortic Atherosclerosis (ICD10-I70.0). Electronically Signed   By: Vanetta Chou M.D.   On: 04/14/2024 17:29   EKG: Independently reviewed.  Sinus rhythm, old RBBB.  Rate 108.  QTc 463.  No significant change from prior.  Assessment/Plan Principal Problem:   Acute GI bleeding Active Problems:   Closed displaced intertrochanteric fracture of right femur (HCC)   ABLA (acute blood loss anemia)   Chronic combined systolic (congestive) and diastolic (congestive) heart failure (HCC)   Decubital ulcer   RBBB (right bundle branch block)   Rheumatoid arthritis (HCC)   ILD (interstitial lung disease) (HCC)  Assessment and Plan:  Acute GI bleeding/acute blood loss anemia- Hgb- 6..  Tachycardic to 116.  Systolic blood pressure 94-115.  Presenting with hematochezia, hematemesis started today.  Denies NSAID use.  CTAP [facility] -diarrheal state.  No history of GI bleed.  Recent hospitalization for right hip fracture requiring 1 unit PRBC post-op, discharge hemoglobin was 7.1.  Lactic acid of 3.9 in the setting of acute GI bleed.  On aspirin  81 mg twice  daily. - Evaluated by GI-twice daily PPI, n.p.o, midnight, plan for EGD 9/30, IV ceftriaxone  1 g given - Transfuse 1 unit PRBC - Trend lactic acid - Hold Aspirin  - Stress steroids -IV Solu-Cortef  40 TID - N/s 50cc/hr x 10hrs - Trend H&H  Leukocytosis-28.2.  X-ray unremarkable.  CT abdomen and pelvis without contrast- diarrheal state.  Immunocompromised.  On chronic steroids. - Will check stool studies with significant leukocytosis. - UA pending - Will defer antibiotics for now - Follow-up blood cultures  Chronic combined systolic and diastolic CHF-stable and compensated, chronic trace bilateral lower extremity.last echo 2023 EF of 50%, grade 1 DD.  He is on torsemide  20 mg daily. -Hold torsemide  for now with acute GI bleeding.  Right hip fracture- 9/29 to 9/27 for hip fracture-underwent ORIF 03/2021. Right thigh has extensive ecchymosis, likely contributed to the anemia. - Hold aspirin   Stage I decubitus ulcer  Rheumatoid arthritis/interstitial lung disease- On chronic prednisone  5 mg a.m. and 1 mg p.m. and mycophenolate . - Resume mycophenolate  - Will start stress dose steroids with acute GI bleed  DVT prophylaxis: SCDS Code Status: FULL code Family Communication: Spouse, Daughters- Primary school teacher and Debbi at bedside Disposition Plan:  > 2 days Consults called: GI Admission status: Inpt Stepdown I certify that at the point of admission it is my clinical judgment that the patient will require inpatient hospital care spanning beyond 2 midnights from the point of admission due to high intensity of service, high risk for further deterioration and high frequency of surveillance required.     Author: Tully FORBES Carwin, MD 04/14/2024 9:30 PM  For on call review [email or web address].

## 2024-04-14 NOTE — ED Notes (Signed)
 Date and time results received: 04/14/24 1556 (use smartphrase .now to insert current time)  Test: Lact Critical Value: 3.9  Name of Provider Notified: Melvenia

## 2024-04-14 NOTE — ED Notes (Signed)
 Pt also c/o of right calf pain. Edp made aware

## 2024-04-14 NOTE — ED Provider Notes (Addendum)
 [place] EMERGENCY DEPARTMENT AT [facility] Provider Note   CSN: [id number] Arrival date & time: 04/14/24  1353     Patient presents with: Hematemesis   Jeffrey Stewart is a 89 y.o. male.   HPI Patient presents for hematemesis.  His medical history includes CHF, CAD, HLD, asthma, arthritis, interstitial lung disease.  He was recently admitted for right hip fracture.  He underwent operative repair 1 week ago.  He had postprocedural blood loss anemia and was transfused 1 unit PRBCs.  He is not currently on a blood thinner.  This morning, he awoke and had 2 episodes of hematemesis.  Vomitus consisted of golf sized blood clots.  He also had 2 episodes of diarrhea which was also described as bloody.  He denies any associated abdominal pain.  EMS noted initial blood pressures in the range of 110 SBP.  Per patient, this is his baseline.  During transit, he had decreased SBP into the 80s.  He was started on IV fluids.    Prior to Admission medications   Medication Sig Start Date End Date Taking? Authorizing Provider  acetaminophen  (TYLENOL ) 500 MG tablet Take 2 tablets (1,000 mg total) by mouth every 8 (eight) hours for 14 days. 04/09/24 04/23/24 Yes Georgina Ozell DELENA, MD  albuterol  (VENTOLIN  HFA) 108 (90 Base) MCG/ACT inhaler Inhale 2 puffs into the lungs every 4 (four) hours as needed for wheezing or shortness of breath. 06/19/22  Yes Bernard Drivers, MD  aspirin  EC 81 MG tablet Take 1 tablet (81 mg total) by mouth 2 (two) times daily. Swallow whole. 04/09/24 05/21/24 Yes Georgina Ozell DELENA, MD  atorvastatin  (LIPITOR) 40 MG tablet TAKE 1 TABLET AT BEDTIME 08/01/23  Yes Nahser, Aleene PARAS, MD  brimonidine  (ALPHAGAN ) 0.2 % ophthalmic solution Place 1 drop into both eyes 2 (two) times daily. 07/20/21  Yes [provider]  carvedilol  (COREG ) 6.25 MG tablet Take 0.5 tablets (3.125 mg total) by mouth 2 (two) times daily with a meal. 04/12/24  Yes Darci Pore, MD  cetirizine (ZYRTEC) 10  MG tablet Take 10 mg by mouth at bedtime.   Yes [provider]  Cholecalciferol  (VITAMIN D3) 1000 units CAPS Take 1,000 Units by mouth daily.   Yes [provider]  ferrous sulfate 325 (65 FE) MG tablet Take 1 tablet (325 mg total) by mouth daily with breakfast. 04/12/24 04/12/25 Yes Darci Pore, MD  fish oil-omega-3 fatty acids  1000 MG capsule Take 1 g by mouth 2 (two) times daily.   Yes [provider]  fluticasone  (FLONASE ) 50 MCG/ACT nasal spray Place 1-2 sprays into both nostrils daily as needed for allergies or rhinitis.   Yes [provider]  fluticasone -salmeterol (ADVAIR) 100-50 MCG/ACT AEPB Inhale 1 puff into the lungs 2 (two) times daily.   Yes [provider]  glucosamine-chondroitin 500-400 MG tablet Take 1 tablet by mouth at bedtime.   Yes [provider]  ipratropium-albuterol  (DUONEB) 0.5-2.5 (3) MG/3ML SOLN Take 3 mLs by nebulization every 6 (six) hours as needed (wheezing, shortness of breath). 07/11/21  Yes [provider]  latanoprost  (XALATAN ) 0.005 % ophthalmic solution Place 1 drop into both eyes at bedtime. 06/27/21  Yes [provider]  loperamide (IMODIUM) 2 MG capsule Take 2 mg by mouth 2 (two) times daily as needed for diarrhea or loose stools.   Yes [provider]  metroNIDAZOLE  (METROCREAM ) 0.75 % cream Apply 1 application  topically 2 (two) times daily as needed (for rosacea).   Yes  [provider]  mycophenolate  (CELLCEPT ) 500 MG tablet TAKE 1 TABLET BY MOUTH TWICE A DAY Patient taking differently: Take 1,000 mg by mouth 2 (two) times daily. 09/17/23  Yes Mannam, Praveen, MD  NEOMYCIN -POLYMYXIN-HYDROCORTISONE  (CORTISPORIN) 1 % SOLN OTIC solution Place 1 drop into both ears as needed (when ears are stopped up). 05/13/19  Yes [provider]  oxyCODONE  (OXY IR/ROXICODONE ) 5 MG immediate release tablet Take 1-2 tablets (5-10 mg total) by mouth every 4 (four) hours as  needed for up to 7 days for moderate pain (pain score 4-6) or severe pain (pain score 7-10). 04/09/24 04/16/24 Yes Georgina Ozell LABOR, MD  Polyethyl Glycol-Propyl Glycol 0.4-0.3 % SOLN Place 1 drop into both eyes as needed.   Yes [provider]  potassium chloride  SA (KLOR-CON  M) 20 MEQ tablet Take 1 tablet (20 mEq total) by mouth daily. 03/18/24  Yes Campbell, Kenzie E, NP  predniSONE  (DELTASONE ) 1 MG tablet Take 1 mg by mouth at bedtime. 05/19/23  Yes [provider]  predniSONE  (DELTASONE ) 5 MG tablet Take 5 mg by mouth daily with breakfast.   Yes [provider]  sodium chloride  (OCEAN) 0.65 % nasal spray Place 1 spray into the nose as needed for congestion.   Yes [provider]  torsemide  (DEMADEX ) 20 MG tablet Take 1 tablet (20 mg total) by mouth daily. 01/31/24  Yes Nahser, Aleene PARAS, MD  polyethylene glycol powder (GLYCOLAX /MIRALAX ) 17 GM/SCOOP powder Take 1 capful (17 g) with water by mouth daily for 14 days. Dissolve 1 capful (17g) in 4-8 ounces of liquid and take by mouth daily. Patient not taking: Reported on 04/14/2024 04/09/24 04/23/24  Georgina Ozell LABOR, MD  senna (SENOKOT) 8.6 MG TABS tablet Take 1 tablet (8.6 mg total) by mouth 2 (two) times daily for 14 days. Patient not taking: Reported on 04/14/2024 04/09/24 04/23/24  Georgina Ozell LABOR, MD    Allergies: Niaspan  [niacin ] and Meloxicam    Review of Systems  Gastrointestinal:  Positive for blood in stool, diarrhea, nausea and vomiting.  All other systems reviewed and are negative.   Updated Vital Signs BP (!) 109/50   Pulse (!) 106   Temp 98.4 F (36.9 C) (Oral)   Resp (!) 22   Ht 6' (1.829 m)   Wt 88 kg   SpO2 99%   BMI 26.31 kg/m   Physical Exam Vitals and nursing note reviewed.  Constitutional:      General: He is not in acute distress.    Appearance: Normal appearance. He is well-developed. He is not ill-appearing, toxic-appearing or diaphoretic.  HENT:     Head: Normocephalic and  atraumatic.     Right Ear: External ear normal.     Left Ear: External ear normal.     Nose: Nose normal.     Mouth/Throat:     Mouth: Mucous membranes are moist.  Eyes:     Extraocular Movements: Extraocular movements intact.     Conjunctiva/sclera: Conjunctivae normal.  Cardiovascular:     Rate and Rhythm: Normal rate and regular rhythm.  Pulmonary:     Effort: Pulmonary effort is normal. No respiratory distress.     Breath sounds: No wheezing or rales.  Chest:     Chest wall: No tenderness.  Abdominal:     General: There is no distension.     Palpations: Abdomen is soft.     Tenderness: There is no abdominal tenderness.  Musculoskeletal:        General: No swelling.  Normal range of motion.     Cervical back: Normal range of motion and neck supple.  Skin:    General: Skin is warm and dry.     Coloration: Skin is pale. Skin is not jaundiced.  Neurological:     General: No focal deficit present.     Mental Status: He is alert and oriented to person, place, and time.  Psychiatric:        Mood and Affect: Mood normal.        Behavior: Behavior normal.     (all labs ordered are listed, but only abnormal results are displayed) Labs Reviewed  COMPREHENSIVE METABOLIC PANEL WITH GFR - Abnormal; Notable for the following components:      Result Value   Glucose, Bld 166 (*)    BUN 72 (*)    Creatinine, Ser 1.62 (*)    Calcium  7.8 (*)    Total Protein 4.7 (*)    Albumin  2.6 (*)    Total Bilirubin 1.7 (*)    GFR, Estimated 41 (*)    All other components within normal limits  CBC WITH DIFFERENTIAL/PLATELET - Abnormal; Notable for the following components:   WBC 28.2 (*)    RBC 1.74 (*)    Hemoglobin 6.3 (*)    HCT 19.7 (*)    MCV 113.2 (*)    MCH 36.2 (*)    RDW 20.1 (*)    nRBC 1.3 (*)    Neutro Abs 25.9 (*)    Monocytes Absolute 0.0 (*)    Abs Immature Granulocytes 0.60 (*)    All other components within normal limits  LACTIC ACID, PLASMA - Abnormal; Notable for the  following components:   Lactic Acid, Venous 3.9 (*)    All other components within normal limits  RESP PANEL BY RT-PCR (RSV, FLU A&B, COVID)  RVPGX2  CULTURE, BLOOD (ROUTINE X 2) W REFLEX TO ID PANEL  CULTURE, BLOOD (ROUTINE X 2) W REFLEX TO ID PANEL  LIPASE, BLOOD  PROTIME-INR  URINALYSIS, ROUTINE W REFLEX MICROSCOPIC  LACTIC ACID, PLASMA  TYPE AND SCREEN  PREPARE RBC (CROSSMATCH)  PREPARE RBC (CROSSMATCH)    EKG: EKG Interpretation Date/Time:  Monday April 14 2024 14:00:20 EDT Ventricular Rate:  108 PR Interval:  148 QRS Duration:  133 QT Interval:  348 QTC Calculation: 463 R Axis:   -18  Text Interpretation: Sinus tachycardia Right bundle branch block Confirmed by Melvenia Motto [phone]) on 04/14/2024 3:12:25 PM  Radiology: [facility] Chest Port 1 View Result Date: 04/14/2024 CLINICAL DATA:  Questionable sepsis. EXAM: PORTABLE CHEST 1 VIEW COMPARISON:  Chest radiograph dated 04/06/2024 FINDINGS: Shallow inspiration with bibasilar atelectasis. No consolidative changes. No pleural effusion pneumothorax. Stable cardiac silhouette. No acute osseous pathology. IMPRESSION: Shallow inspiration with bibasilar atelectasis. No focal consolidation. Electronically Signed   By: Vanetta Chou M.D.   On: 04/14/2024 17:30   CT ABDOMEN PELVIS WO CONTRAST Result Date: 04/14/2024 CLINICAL DATA:  Abdominal pain.  Hematemesis. EXAM: CT ABDOMEN AND PELVIS WITHOUT CONTRAST TECHNIQUE: Multidetector CT imaging of the abdomen and pelvis was performed following the standard protocol without IV contrast. RADIATION DOSE REDUCTION: This exam was performed according to the departmental dose-optimization program which includes automated exposure control, adjustment of the mA and/or kV according to patient size and/or use of iterative reconstruction technique. COMPARISON:  Abdominal ultrasound dated 11/04/2021. FINDINGS: Evaluation of this exam is limited in the absence of intravenous contrast. Lower chest: Bibasilar  subpleural atelectasis/scarring. There is coronary vascular calcification. No intra-abdominal free  air or free fluid. Hepatobiliary: The liver is unremarkable. No biliary dilatation. The gallbladder is unremarkable. Pancreas: Unremarkable. No pancreatic ductal dilatation or surrounding inflammatory changes. Spleen: Normal in size without focal abnormality. Adrenals/Urinary Tract: The adrenal glands unremarkable. There is no hydronephrosis or nephrolithiasis on either side. Bilateral perinephric stranding, nonspecific. Correlation with urinalysis recommended to exclude UTI. The visualized ureters and urinary bladder appear unremarkable. Stomach/Bowel: There is loose stool throughout the colon suggestive of diarrheal state. Correlation with clinical exam and stool cultures recommended. There is no bowel obstruction or active inflammation. Appendectomy. Vascular/Lymphatic: Moderate aortoiliac atherosclerotic disease. The IVC is unremarkable. No portal venous gas. There is no adenopathy. Reproductive: The prostate and seminal vesicles are grossly unremarkable. Other: Small fat containing bilateral inguinal hernias. Anterior pelvic wall hernia repair mesh. Musculoskeletal: Osteopenia with degenerative changes of the spine. Status post ORIF of right femoral neck fracture. There is edema in the surrounding soft tissues of the right hip. No fluid collection. IMPRESSION: 1. Diarrheal state. Correlation with clinical exam and stool cultures recommended. No bowel obstruction. 2. No hydronephrosis or nephrolithiasis. 3.  Aortic Atherosclerosis (ICD10-I70.0). Electronically Signed   By: Vanetta Chou M.D.   On: 04/14/2024 17:29     Procedures   Medications Ordered in the ED  pantoprazole (PROTONIX) injection 40 mg (40 mg Intravenous Given 04/14/24 1453)  0.9 %  sodium chloride  infusion ( Intravenous New Bag/Given 04/14/24 1453)  0.9 %  sodium chloride  infusion (Manually program via Guardrails IV Fluids) ( Intravenous  Canceled Entry 04/14/24 1450)  cefTRIAXone  (ROCEPHIN ) 1 g in sodium chloride  0.9 % 100 mL IVPB (has no administration in time range)  calcium  gluconate 1 g/ 50 mL sodium chloride  IVPB (0 mg Intravenous Stopped 04/14/24 1640)  0.9 %  sodium chloride  infusion (Manually program via Guardrails IV Fluids) (0 mLs Intravenous Stopped 04/14/24 1640)                                    Medical Decision Making Amount and/or Complexity of Data Reviewed Labs: ordered. Radiology: ordered.  Risk Prescription drug management. Decision regarding hospitalization.   This patient presents to the ED for concern of hematemesis, this involves an extensive number of treatment options, and is a complaint that carries with it a high risk of complications and morbidity.  The differential diagnosis includes PUD, variceal bleed, fistula, neoplasm   Co morbidities / Chronic conditions that complicate the patient evaluation  CHF, CAD, HLD, asthma, arthritis, interstitial lung disease   Additional history obtained:  Additional history obtained from EMR External records from outside source obtained and reviewed including EMS   Lab Tests:  I Ordered, and personally interpreted labs.  The pertinent results include: Acute on chronic anemia with hemoglobin of 6.3 today.  A leukocytosis and lactic acidosis are present.  Creatinine today is consistent with AKI.  BUN is elevated consistent with prerenal etiology and/or upper GI bleed.   Imaging Studies ordered:  I ordered imaging studies including chest x-ray, CT of abdomen and pelvis I independently visualized and interpreted imaging which showed diarrheal state without other acute findings I agree with the radiologist interpretation   Cardiac Monitoring: / EKG:  The patient was maintained on a cardiac monitor.  I personally viewed and interpreted the cardiac monitored which showed an underlying rhythm of: Sinus rhythm   Problem List / ED Course / Critical  interventions / Medication management  Patient presented for hematemesis  beginning today.  He also had 2 episodes of bloody diarrhea.  On arrival, he is alert and oriented.  He is mildly pale but otherwise well-appearing.  Vital signs notable for mild tachycardia and 1 and pulse pressure.  Per chart review, he recently underwent hip surgery.  He did have blood loss anemia from the surgery and did receive 1 unit PRBCs.  Despite this, his hemoglobin time of discharge was in the sevens.  Given his vital signs today, patient was given 1 unit of emergency release blood.  CBC does show acute on chronic anemia with hemoglobin of 6.3.  Additional unit PRBCs was ordered.  Patient's other lab work notable for AKI, leukocytosis, and lactic acidosis.  IV fluids were given for hydration.  Leukocytosis and lactic acidosis may be secondary to blood loss anemia.  He underwent chest x-ray and CT of abdomen and pelvis.  Neither of these showed infectious source.  He remained hemodynamically stable.  He tolerated blood transfusions.  I spoke with gastroenterologist on-call, Dr. Cindie, who will see in consult.  Patient to be admitted for further management.  On the ED, patient had pain in area of right hip.  This is similar to the pain he has been experiencing postoperatively.  At home, he takes oxycodone .  Dilaudid  was ordered for analgesia. I ordered medication including IV fluids for hydration, PRBCs for symptomatic anemia, Protonix for upper GI bleed, calcium  gluconate for hypocalcemia, ceftriaxone  for upper GI bleed Reevaluation of the patient after these medicines showed that the patient improved I have reviewed the patients home medicines and have made adjustments as needed   Consultations Obtained:  I requested consultation with the neurologist, Dr. Cindie,  and discussed lab and imaging findings as well as pertinent plan - they recommend: Will see in consult   Social Determinants of Health:  Has access to  outpatient care  CRITICAL CARE Performed by: Bernardino Fireman   Total critical care time: 33 minutes  Critical care time was exclusive of separately billable procedures and treating other patients.  Critical care was necessary to treat or prevent imminent or life-threatening deterioration.  Critical care was time spent personally by me on the following activities: development of treatment plan with patient and/or surrogate as well as nursing, discussions with consultants, evaluation of patient's response to treatment, examination of patient, obtaining history from patient or surrogate, ordering and performing treatments and interventions, ordering and review of laboratory studies, ordering and review of radiographic studies, pulse oximetry and re-evaluation of patient's condition.      Final diagnoses:  Upper GI bleed  Symptomatic anemia  AKI (acute kidney injury)    ED Discharge Orders     None          Fireman Bernardino, MD 04/14/24 1755    Fireman Bernardino, MD 04/14/24 [phone]

## 2024-04-15 ENCOUNTER — Inpatient Hospital Stay (HOSPITAL_COMMUNITY): Admitting: Certified Registered"

## 2024-04-15 ENCOUNTER — Other Ambulatory Visit: Payer: Self-pay

## 2024-04-15 ENCOUNTER — Inpatient Hospital Stay (HOSPITAL_COMMUNITY)

## 2024-04-15 ENCOUNTER — Encounter (HOSPITAL_COMMUNITY)
Admission: EM | Disposition: A | Discharge status: Home Health | Payer: Self-pay | Source: Home / Self Care | Attending: Internal Medicine

## 2024-04-15 DIAGNOSIS — I5042 Chronic combined systolic (congestive) and diastolic (congestive) heart failure: Secondary | ICD-10-CM | POA: Diagnosis not present

## 2024-04-15 DIAGNOSIS — K250 Acute gastric ulcer with hemorrhage: Secondary | ICD-10-CM | POA: Diagnosis not present

## 2024-04-15 DIAGNOSIS — K2950 Unspecified chronic gastritis without bleeding: Secondary | ICD-10-CM

## 2024-04-15 DIAGNOSIS — K254 Chronic or unspecified gastric ulcer with hemorrhage: Principal | ICD-10-CM

## 2024-04-15 DIAGNOSIS — I2510 Atherosclerotic heart disease of native coronary artery without angina pectoris: Secondary | ICD-10-CM

## 2024-04-15 DIAGNOSIS — D62 Acute posthemorrhagic anemia: Secondary | ICD-10-CM | POA: Diagnosis not present

## 2024-04-15 DIAGNOSIS — K921 Melena: Secondary | ICD-10-CM

## 2024-04-15 DIAGNOSIS — K259 Gastric ulcer, unspecified as acute or chronic, without hemorrhage or perforation: Secondary | ICD-10-CM

## 2024-04-15 DIAGNOSIS — K31A11 Gastric intestinal metaplasia without dysplasia, involving the antrum: Secondary | ICD-10-CM

## 2024-04-15 DIAGNOSIS — L89151 Pressure ulcer of sacral region, stage 1: Secondary | ICD-10-CM | POA: Diagnosis not present

## 2024-04-15 HISTORY — PX: ESOPHAGOGASTRODUODENOSCOPY: SHX5428

## 2024-04-15 LAB — CBC
HCT: 20.1 % — ABNORMAL LOW (ref 39.0–52.0)
Hemoglobin: 6.4 g/dL — CL (ref 13.0–17.0)
MCH: 33.5 pg (ref 26.0–34.0)
MCHC: 31.8 g/dL (ref 30.0–36.0)
MCV: 105.2 fL — ABNORMAL HIGH (ref 80.0–100.0)
Platelets: 291 K/uL (ref 150–400)
RBC: 1.91 MIL/uL — ABNORMAL LOW (ref 4.22–5.81)
RDW: 22.6 % — ABNORMAL HIGH (ref 11.5–15.5)
WBC: 30 K/uL — ABNORMAL HIGH (ref 4.0–10.5)
nRBC: 2.3 % — ABNORMAL HIGH (ref 0.0–0.2)

## 2024-04-15 LAB — CBC WITH DIFFERENTIAL/PLATELET
Abs Immature Granulocytes: 2.31 K/uL — ABNORMAL HIGH (ref 0.00–0.07)
Basophils Absolute: 0.1 K/uL (ref 0.0–0.1)
Basophils Relative: 0 %
Eosinophils Absolute: 0 K/uL (ref 0.0–0.5)
Eosinophils Relative: 0 %
HCT: 28.5 % — ABNORMAL LOW (ref 39.0–52.0)
Hemoglobin: 9.5 g/dL — ABNORMAL LOW (ref 13.0–17.0)
Immature Granulocytes: 9 %
Lymphocytes Relative: 7 %
Lymphs Abs: 1.7 K/uL (ref 0.7–4.0)
MCH: 31.9 pg (ref 26.0–34.0)
MCHC: 33.3 g/dL (ref 30.0–36.0)
MCV: 95.6 fL (ref 80.0–100.0)
Monocytes Absolute: 2 K/uL — ABNORMAL HIGH (ref 0.1–1.0)
Monocytes Relative: 8 %
Neutro Abs: 19.1 K/uL — ABNORMAL HIGH (ref 1.7–7.7)
Neutrophils Relative %: 76 %
Platelets: 239 K/uL (ref 150–400)
RBC: 2.98 MIL/uL — ABNORMAL LOW (ref 4.22–5.81)
RDW: 20.4 % — ABNORMAL HIGH (ref 11.5–15.5)
Smear Review: NORMAL
WBC: 25.1 K/uL — ABNORMAL HIGH (ref 4.0–10.5)
nRBC: 1.6 % — ABNORMAL HIGH (ref 0.0–0.2)

## 2024-04-15 LAB — BASIC METABOLIC PANEL WITH GFR
Anion gap: 14 (ref 5–15)
Anion gap: 12 (ref 5–15)
BUN: 87 mg/dL — ABNORMAL HIGH (ref 8–23)
BUN: 87 mg/dL — ABNORMAL HIGH (ref 8–23)
CO2: 20 mmol/L — ABNORMAL LOW (ref 22–32)
CO2: 21 mmol/L — ABNORMAL LOW (ref 22–32)
Calcium: 7.4 mg/dL — ABNORMAL LOW (ref 8.9–10.3)
Calcium: 7.6 mg/dL — ABNORMAL LOW (ref 8.9–10.3)
Chloride: 104 mmol/L (ref 98–111)
Chloride: 107 mmol/L (ref 98–111)
Creatinine, Ser: 2.16 mg/dL — ABNORMAL HIGH (ref 0.61–1.24)
Creatinine, Ser: 2.06 mg/dL — ABNORMAL HIGH (ref 0.61–1.24)
GFR, Estimated: 29 mL/min — ABNORMAL LOW (ref >60)
GFR, Estimated: 31 mL/min — ABNORMAL LOW (ref >60)
Glucose, Bld: 212 mg/dL — ABNORMAL HIGH (ref 70–99)
Glucose, Bld: 175 mg/dL — ABNORMAL HIGH (ref 70–99)
Potassium: 5.7 mmol/L — ABNORMAL HIGH (ref 3.5–5.1)
Potassium: 5.2 mmol/L — ABNORMAL HIGH (ref 3.5–5.1)
Sodium: 138 mmol/L (ref 135–145)
Sodium: 140 mmol/L (ref 135–145)

## 2024-04-15 LAB — MAGNESIUM: Magnesium: 2.4 mg/dL (ref 1.7–2.4)

## 2024-04-15 LAB — LACTIC ACID, PLASMA
Lactic Acid, Venous: 5.7 mmol/L (ref 0.5–1.9)
Lactic Acid, Venous: 2.3 mmol/L (ref 0.5–1.9)

## 2024-04-15 LAB — PREPARE RBC (CROSSMATCH)

## 2024-04-15 LAB — MRSA NEXT GEN BY PCR, NASAL: MRSA by PCR Next Gen: DETECTED — AB

## 2024-04-15 LAB — PHOSPHORUS: Phosphorus: 4.7 mg/dL — ABNORMAL HIGH (ref 2.5–4.6)

## 2024-04-15 LAB — GLUCOSE, CAPILLARY
Glucose-Capillary: 221 mg/dL — ABNORMAL HIGH (ref 70–99)
Glucose-Capillary: 154 mg/dL — ABNORMAL HIGH (ref 70–99)

## 2024-04-15 SURGERY — EGD (ESOPHAGOGASTRODUODENOSCOPY)
Anesthesia: General

## 2024-04-15 MED ORDER — SUCRALFATE 1 GM/10ML PO SUSP
1.0000 g | Freq: Three times a day (TID) | ORAL | Status: DC
  Administered 2024-04-15 – 2024-04-21 (×21): 1 g via ORAL
  Filled 2024-04-15 (×22): qty 10

## 2024-04-15 MED ORDER — SODIUM CHLORIDE 0.9 % IV SOLN: INTRAVENOUS | Status: DC

## 2024-04-15 MED ORDER — SODIUM CHLORIDE 0.9 % IV SOLN: 250.0000 mL | INTRAVENOUS | Status: AC

## 2024-04-15 MED ORDER — SODIUM CHLORIDE 0.9% IV SOLUTION: Freq: Once | INTRAVENOUS | Status: DC

## 2024-04-15 MED ORDER — MUPIROCIN 2 % EX OINT
1.0000 | TOPICAL_OINTMENT | Freq: Two times a day (BID) | CUTANEOUS | Status: AC
  Administered 2024-04-15 – 2024-04-19 (×10): 1 via NASAL
  Filled 2024-04-15: qty 22

## 2024-04-15 MED ORDER — LOPERAMIDE HCL 2 MG PO CAPS: 2.0000 mg | ORAL_CAPSULE | Freq: Two times a day (BID) | ORAL | Status: DC | PRN

## 2024-04-15 MED ORDER — LACTATED RINGERS IV SOLN: INTRAVENOUS | Status: DC | PRN

## 2024-04-15 MED ORDER — OMEGA-3-ACID ETHYL ESTERS 1 G PO CAPS
1.0000 g | ORAL_CAPSULE | Freq: Two times a day (BID) | ORAL | Status: DC
  Administered 2024-04-15 – 2024-04-21 (×11): 1 g via ORAL
  Filled 2024-04-15 (×11): qty 1

## 2024-04-15 MED ORDER — GLUCAGON HCL RDNA (DIAGNOSTIC) 1 MG IJ SOLR
INTRAMUSCULAR | Status: DC | PRN
  Administered 2024-04-15: .25 mg via INTRAVENOUS

## 2024-04-15 MED ORDER — SODIUM CHLORIDE 0.9 % IV BOLUS
1000.0000 mL | Freq: Once | INTRAVENOUS | Status: AC
Start: 2024-04-15 — End: 2024-04-15
  Administered 2024-04-15: 1000 mL via INTRAVENOUS

## 2024-04-15 MED ORDER — LATANOPROST 0.005 % OP SOLN
1.0000 [drp] | Freq: Every day | OPHTHALMIC | Status: DC
  Administered 2024-04-15 – 2024-04-20 (×6): 1 [drp] via OPHTHALMIC
  Filled 2024-04-15 (×3): qty 2.5

## 2024-04-15 MED ORDER — HYDROCORTISONE SOD SUC (PF) 100 MG IJ SOLR
40.0000 mg | Freq: Two times a day (BID) | INTRAMUSCULAR | Status: DC
  Administered 2024-04-15 – 2024-04-20 (×11): 40 mg via INTRAVENOUS
  Filled 2024-04-15 (×12): qty 2

## 2024-04-15 MED ORDER — NOREPINEPHRINE 4 MG/250ML-% IV SOLN
0.0000 ug/min | INTRAVENOUS | Status: DC
  Administered 2024-04-15 (×2): 2 ug/min via INTRAVENOUS
  Administered 2024-04-16: 3 ug/min via INTRAVENOUS
  Administered 2024-04-17: 1 ug/min via INTRAVENOUS
  Administered 2024-04-18: 2 ug/min via INTRAVENOUS
  Administered 2024-04-19: 1 ug/min via INTRAVENOUS
  Filled 2024-04-15 (×4): qty 250

## 2024-04-15 MED ORDER — FERROUS SULFATE 325 (65 FE) MG PO TABS
325.0000 mg | ORAL_TABLET | Freq: Every day | ORAL | Status: DC
  Administered 2024-04-16: 325 mg via ORAL
  Filled 2024-04-15: qty 1

## 2024-04-15 MED ORDER — SODIUM CHLORIDE 0.9% FLUSH: 10.0000 mL | INTRAVENOUS | Status: DC | PRN

## 2024-04-15 MED ORDER — POLYVINYL ALCOHOL 1.4 % OP SOLN: 1.0000 [drp] | OPHTHALMIC | Status: DC | PRN

## 2024-04-15 MED ORDER — FLUTICASONE PROPIONATE 50 MCG/ACT NA SUSP: 1.0000 | Freq: Every day | NASAL | Status: DC | PRN

## 2024-04-15 MED ORDER — SODIUM CHLORIDE 0.9 % IV SOLN: INTRAVENOUS | Status: AC

## 2024-04-15 MED ORDER — METOCLOPRAMIDE HCL 5 MG/ML IJ SOLN
10.0000 mg | Freq: Four times a day (QID) | INTRAMUSCULAR | Status: AC
  Administered 2024-04-15 (×2): 10 mg via INTRAVENOUS
  Filled 2024-04-15 (×2): qty 2

## 2024-04-15 MED ORDER — BRIMONIDINE TARTRATE 0.2 % OP SOLN
1.0000 [drp] | Freq: Two times a day (BID) | OPHTHALMIC | Status: DC
  Administered 2024-04-15 – 2024-04-21 (×11): 1 [drp] via OPHTHALMIC
  Filled 2024-04-15 (×2): qty 5

## 2024-04-15 MED ORDER — SODIUM ZIRCONIUM CYCLOSILICATE 10 G PO PACK
10.0000 g | PACK | Freq: Three times a day (TID) | ORAL | Status: AC
  Administered 2024-04-15 (×2): 10 g via ORAL
  Filled 2024-04-15 (×3): qty 1

## 2024-04-15 MED ORDER — LIDOCAINE HCL (CARDIAC) PF 100 MG/5ML IV SOSY
PREFILLED_SYRINGE | INTRAVENOUS | Status: DC | PRN
  Administered 2024-04-15: 80 mg via INTRATRACHEAL

## 2024-04-15 MED ORDER — PROPOFOL 10 MG/ML IV BOLUS
INTRAVENOUS | Status: DC | PRN
  Administered 2024-04-15 (×4): 20 mg via INTRAVENOUS

## 2024-04-15 MED ORDER — ATORVASTATIN CALCIUM 40 MG PO TABS
40.0000 mg | ORAL_TABLET | Freq: Every day | ORAL | Status: DC
  Administered 2024-04-15 – 2024-04-20 (×6): 40 mg via ORAL
  Filled 2024-04-15 (×6): qty 1

## 2024-04-15 MED ORDER — IPRATROPIUM-ALBUTEROL 0.5-2.5 (3) MG/3ML IN SOLN
3.0000 mL | Freq: Four times a day (QID) | RESPIRATORY_TRACT | Status: DC | PRN
  Administered 2024-04-21: 3 mL via RESPIRATORY_TRACT
  Filled 2024-04-15: qty 3

## 2024-04-15 MED ORDER — DEXTROSE 50 % IV SOLN
1.0000 | Freq: Once | INTRAVENOUS | Status: AC
  Administered 2024-04-15: 50 mL via INTRAVENOUS
  Filled 2024-04-15: qty 50

## 2024-04-15 MED ORDER — SODIUM CHLORIDE (PF) 0.9 % IJ SOLN
PREFILLED_SYRINGE | INTRAVENOUS | Status: DC | PRN
  Administered 2024-04-15: 2 mL

## 2024-04-15 MED ORDER — INSULIN ASPART 100 UNIT/ML IV SOLN
5.0000 [IU] | Freq: Once | INTRAVENOUS | Status: AC
  Administered 2024-04-15: 5 [IU] via INTRAVENOUS

## 2024-04-15 MED ORDER — FLUTICASONE FUROATE-VILANTEROL 100-25 MCG/ACT IN AEPB
1.0000 | INHALATION_SPRAY | Freq: Every day | RESPIRATORY_TRACT | Status: DC
Start: 2024-04-16 — End: 2024-04-21
  Administered 2024-04-16 – 2024-04-21 (×6): 1 via RESPIRATORY_TRACT
  Filled 2024-04-15: qty 28

## 2024-04-15 MED ORDER — SODIUM CHLORIDE 0.9% FLUSH
10.0000 mL | Freq: Two times a day (BID) | INTRAVENOUS | Status: DC
  Administered 2024-04-15: 20 mL
  Administered 2024-04-16: 30 mL
  Administered 2024-04-16: 10 mL
  Administered 2024-04-17: 20 mL
  Administered 2024-04-17: 10 mL
  Administered 2024-04-18 (×2): 20 mL
  Administered 2024-04-19: 10 mL
  Administered 2024-04-19: 20 mL
  Administered 2024-04-20 (×2): 10 mL

## 2024-04-15 MED ORDER — CHLORHEXIDINE GLUCONATE CLOTH 2 % EX PADS
6.0000 | MEDICATED_PAD | Freq: Every day | CUTANEOUS | Status: AC
  Administered 2024-04-15 – 2024-04-19 (×5): 6 via TOPICAL

## 2024-04-15 MED ORDER — ETOMIDATE 2 MG/ML IV SOLN
INTRAVENOUS | Status: DC | PRN
  Administered 2024-04-15 (×2): 6 mg via INTRAVENOUS

## 2024-04-15 NOTE — Progress Notes (Signed)
 Patient wedding band taken off patient hand due to swelling and given to patient wife, Maceo.

## 2024-04-15 NOTE — Significant Event (Signed)
 Notified by RN of worsening hypotension overnight. + had 3 large dark red bloody Bms. Appears to have worsening hemorrhagic shock. I ordered for 2 U additional RBC, 1 L LR, and repeat labs. HB 7.3 -> 6.4 prior to getting these 2 additional units. Lactate up to 5.7, and also developing renal failure. Cr up to 2.16, associated hyperkalemia at 5.7.   Plan:  -- Escalate to ICU  -- Called and spoke with Dr. Cindie to notify of clinical change, plan for endoscopy 1st thing in AM. He is not suitable for CTA bleeding scan due to his worsening renal failure.  -- 2 U RBC transfusion ordered, with additional 2 U RBC to keep ahead if needed.  -- No AC for reversal INR is 1.1, PLT 291, hold on FFP and PLT for now  -- Start on Levophed peripherally for MAP > 65  -- Insulin / D50 for hyperkalemia with serial K check x 2 ordered   Dorn Dawson, MD  [facility]

## 2024-04-15 NOTE — Plan of Care (Signed)
  Problem: Education: Goal: Knowledge of General Education information will improve Description: Including pain rating scale, medication(s)/side effects and non-pharmacologic comfort measures Outcome: Progressing   Problem: Health Behavior/Discharge Planning: Goal: Ability to manage health-related needs will improve Outcome: Progressing   Problem: Clinical Measurements: Goal: Diagnostic test results will improve Outcome: Not Progressing Goal: Cardiovascular complication will be avoided Outcome: Not Progressing

## 2024-04-15 NOTE — Plan of Care (Signed)
  Problem: Activity: Goal: Risk for activity intolerance will decrease Outcome: Progressing   Problem: Nutrition: Goal: Adequate nutrition will be maintained Outcome: Progressing   Problem: Coping: Goal: Level of anxiety will decrease Outcome: Progressing   Problem: Pain Managment: Goal: General experience of comfort will improve and/or be controlled Outcome: Progressing   Problem: Safety: Goal: Ability to remain free from injury will improve Outcome: Progressing

## 2024-04-15 NOTE — Progress Notes (Addendum)
 PROGRESS NOTE   Jeffrey Stewart  FMW:[id number] DOB: [birth year] DOA: 04/14/2024 PCP: Anita Bernardino [name], FNP   Chief Complaint  Patient presents with   Hematemesis   Level of care: ICU  Brief Admission History:  89 y.o. male with medical history significant for CHF, interstitial lung disease, RBBB, rheumatoid arthritis. Patient was brought to the ED via EMS with reports of vomiting of blood, and blood in his stools that started this morning.  Spouse is at bedside and confirms that patients vomitus contained golf sized blood clots.  He is not aware of having any black stools.  Reports some lower abdominal pain earlier today that has since resolved.  He has history of hemorrhoids which occasionally bleed, but not to this extent, he has never vomited blood.  He denies NSAID use.  EMS reports blood pressure transiently down to 80s and route.   Recently hospitalized 9/29 to 9/27 for hip fracture-underwent ORIF 03/2021.  Also with acute blood loss anemia postprocedure, requiring 1 unit PRBC.  Hemoglobin on discharge was 7.1.  Refused SNF and was discharged to home.  He is not on full dose anticoagulation.   ED Course: Temperature 98.2.  Tachycardic heart rate 94-116.  Respiratory rate 18- 35.  Blood pressure systolic 94-115.  O2 sat greater than 92% on room air. Hemoglobin 6.3.  WBC 28.2.  Lactic acid 3.9.  CTAP W OC-diarrheal state, correlation with clinical exam and stool cultures recommended.  EDP talked to Dr. Cindie, n.p.o., IV PPI twice daily, plan for EGD 9/30.   Assessment and Plan:  Hemorrhagic Shock Acute blood loss anemia Possible acute upper GI hemorrhage Multiple gastric ulcers  -- pt had blood BMs overnight, transferred to ICU started on pressor support for hypotension -- pt had another large blood BM this morning -- 3 units PRBC transfused this morning prior to EGD -- once stabilized, he was taken to EGD with Dr. Eartha -- he remains on pressor support  -- monitoring H/H  closely -- he was treated in EGD for gastric ulcers -- pantoprazole 40 mg IV BID x 72 hours per GI recommendations -- sucralfate suspension QID for 8 weeks -- avoid all NSAIDS -- repeat HH if he has another bloody BM otherwise repeat in AM  -- if he has recurrent bleeding or hemodynamic instability perform STAT CT angio bleed protocol, consult IR if active bleed identified -- working to wean IV norepinephrine as able  -- Hg up to 9.5 after PRBC transfusion   AKI Lactic acidosis Hyperkalemia -- treated with IV dextrose  and insulin  -- down to 5.2 -- add oral lokelma 10 g TID x 2 doses -- follow  -- continue IV fluid hydration  -- check bladder scan, renal US  -- treating hypotension with IV norepinephrine to improve renal perfusion -- follow BMP  -- lactic acidosis trending down with IV fluid and improving low blood pressure  Leukocytosis -- acute rise of WBC likely reactive to GI hemorrhage and hemorrhagic shock -- also received stressed steroid dosing of IV hydrocortisone   New irregular nodular density seen in right upper lobe  -- CT chest recommended when more clinically stable   Glaucoma -- resumed home eye drops  Chronic combined systolic and diastolic CHF-stable and compensated, chronic trace bilateral lower extremity.last echo 2023 EF of 50%, grade 1 DD.  He is on torsemide  20 mg daily. -Hold torsemide  for now with acute GI bleeding.   Right hip fracture- 9/29 to 9/27 for hip fracture-underwent ORIF 03/2021. Right thigh has  extensive ecchymosis, likely contributed to the anemia. - Hold aspirin  and all anticoagulation    Stage I decubitus ulcer -- skin care protocol  Rheumatoid arthritis/interstitial lung disease- On chronic prednisone  5 mg a.m. and 1 mg p.m. and mycophenolate . - Resume mycophenolate  - remains on stress dose steroids with acute GI bleed  DVT prophylaxis: SCDs Code Status: Full  Family Communication: wife bedside  Disposition: TBD    Consultants:   GI  Procedures:  EGD 9/30 Dr. Eartha   Antimicrobials:    Subjective: Pt reports that he had a rough night having multiple bloody stools and moved to ICU, he was frightened but feeling more at ease this morning  Objective: Vitals:   04/15/24 1409 04/15/24 1415 04/15/24 1430 04/15/24 1445  BP: (!) 160/146 (!) 168/158 (!) 92/46 97/64  Pulse: 60 76 88 86  Resp: 13 19    Temp: 98.4 F (36.9 C)     TempSrc:      SpO2: 99% 92% 98% 98%  Weight:      Height:        Intake/Output Summary (Last 24 hours) at 04/15/2024 1513 Last data filed at 04/15/2024 1032 Gross per 24 hour  Intake 2406.82 ml  Output 325 ml  Net 2081.82 ml   Filed Weights   04/14/24 1359  Weight: 88 kg   Examination:  General exam: Appears calm and comfortable, awake, alert, speaking full sentences and mentating.  Respiratory system: Clear to auscultation. Respiratory effort normal. Cardiovascular system: normal S1 & S2 heard. No JVD, murmurs, rubs, gallops or clicks. No pedal edema. Gastrointestinal system: Abdomen is nondistended, soft and nontender. No organomegaly or masses felt. Normal bowel sounds heard. Central nervous system: Alert and oriented. No focal neurological deficits. Extremities: Symmetric 5 x 5 power. Skin: No rashes, lesions or ulcers. Psychiatry: Judgement and insight appear normal. Mood & affect appropriate.   Data Reviewed: I have personally reviewed following labs and imaging studies  CBC: Recent Labs  Lab 04/11/24 0406 04/12/24 0618 04/14/24 1422 04/14/24 2040 04/15/24 0339 04/15/24 1154  WBC 8.4 8.8 28.2*  --  30.0* 25.1*  NEUTROABS  --   --  25.9*  --   --  19.1*  HGB 7.1* 7.1* 6.3* 7.3* 6.4* 9.5*  HCT 21.1* 21.6* 19.7* 22.4* 20.1* 28.5*  MCV 106.0* 106.9* 113.2*  --  105.2* 95.6  PLT 109* 132* 274  --  291 239    Basic Metabolic Panel: Recent Labs  Lab 04/11/24 0406 04/12/24 0618 04/14/24 1422 04/15/24 0339 04/15/24 1154  NA 137 137 136 138 140  K 4.7 4.8  4.8 5.7* 5.2*  CL 103 100 101 104 107  CO2 28 29 22  20* 21*  GLUCOSE 125* 121* 166* 212* 175*  BUN 48* 40* 72* 87* 87*  CREATININE 1.23 1.20 1.62* 2.16* 2.06*  CALCIUM  8.2* 8.4* 7.8* 7.4* 7.6*  MG  --   --   --  2.4  --   PHOS  --   --   --  4.7*  --     CBG: Recent Labs  Lab 04/14/24 2210 04/15/24 0541 04/15/24 1342  GLUCAP 175* 221* 154*    Recent Results (from the past 240 hours)  Surgical pcr screen     Status: Abnormal   Collection Time: 04/07/24  7:36 AM   Specimen: Nasal Mucosa; Nasal Swab  Result Value Ref Range Status   MRSA, PCR POSITIVE (A) NEGATIVE Final    Comment: RESULT CALLED TO, READ BACK BY AND VERIFIED  WITH: RN angel m [phone] fcp    Staphylococcus aureus POSITIVE (A) NEGATIVE Final    Comment: (NOTE) The Xpert SA Assay (FDA approved for NASAL specimens in patients 57 years of age and older), is one component of a comprehensive surveillance program. It is not intended to diagnose infection nor to guide or monitor treatment. Performed at [facility], [address] [place], [place] [address]   Resp panel by RT-PCR (RSV, Flu A&B, Covid) Anterior Nasal Swab     Status: None   Collection Time: 04/14/24  2:03 PM   Specimen: Anterior Nasal Swab  Result Value Ref Range Status   SARS Coronavirus 2 by RT PCR NEGATIVE NEGATIVE Final    Comment: (NOTE) SARS-CoV-2 target nucleic acids are NOT DETECTED.  The SARS-CoV-2 RNA is generally detectable in upper respiratory specimens during the acute phase of infection. The lowest concentration of SARS-CoV-2 viral copies this assay can detect is 138 copies/mL. A negative result does not preclude SARS-Cov-2 infection and should not be used as the sole basis for treatment or other patient management decisions. A negative result may occur with  improper specimen collection/handling, submission of specimen other than nasopharyngeal swab, presence of viral mutation(s) within the areas targeted by this  assay, and inadequate number of viral copies(<138 copies/mL). A negative result must be combined with clinical observations, patient history, and epidemiological information. The expected result is Negative.  Fact Sheet for Patients:  BloggerCourse.com  Fact Sheet for Healthcare Providers:  SeriousBroker.it  This test is no t yet approved or cleared by the [place]  FDA and  has been authorized for detection and/or diagnosis of SARS-CoV-2 by FDA under an Emergency Use Authorization (EUA). This EUA will remain  in effect (meaning this test can be used) for the duration of the COVID-19 declaration under Section 564(b)(1) of the Act, 21 U.S.C.section 360bbb-3(b)(1), unless the authorization is terminated  or revoked sooner.       Influenza A by PCR NEGATIVE NEGATIVE Final   Influenza B by PCR NEGATIVE NEGATIVE Final    Comment: (NOTE) The Xpert Xpress SARS-CoV-2/FLU/RSV plus assay is intended as an aid in the diagnosis of influenza from Nasopharyngeal swab specimens and should not be used as a sole basis for treatment. Nasal washings and aspirates are unacceptable for Xpert Xpress SARS-CoV-2/FLU/RSV testing.  Fact Sheet for Patients: BloggerCourse.com  Fact Sheet for Healthcare Providers: SeriousBroker.it  This test is not yet approved or cleared by the [place]  FDA and has been authorized for detection and/or diagnosis of SARS-CoV-2 by FDA under an Emergency Use Authorization (EUA). This EUA will remain in effect (meaning this test can be used) for the duration of the COVID-19 declaration under Section 564(b)(1) of the Act, 21 U.S.C. section 360bbb-3(b)(1), unless the authorization is terminated or revoked.     Resp Syncytial Virus by PCR NEGATIVE NEGATIVE Final    Comment: (NOTE) Fact Sheet for Patients: BloggerCourse.com  Fact Sheet for  Healthcare Providers: SeriousBroker.it  This test is not yet approved or cleared by the [place]  FDA and has been authorized for detection and/or diagnosis of SARS-CoV-2 by FDA under an Emergency Use Authorization (EUA). This EUA will remain in effect (meaning this test can be used) for the duration of the COVID-19 declaration under Section 564(b)(1) of the Act, 21 U.S.C. section 360bbb-3(b)(1), unless the authorization is terminated or revoked.  Performed at [facility], [address], [place], [place] [address]   Culture, blood (Routine X 2) w Reflex  to ID Panel     Status: None (Preliminary result)   Collection Time: 04/14/24  9:26 PM   Specimen: BLOOD  Result Value Ref Range Status   Specimen Description BLOOD BLOOD RIGHT HAND  Final   Special Requests NONE  Final   Culture   Final    NO GROWTH < 12 HOURS Performed at [facility], [address], [place], [place] [address]    Report Status PENDING  Incomplete  Culture, blood (Routine X 2) w Reflex to ID Panel     Status: None (Preliminary result)   Collection Time: 04/14/24  9:26 PM   Specimen: BLOOD  Result Value Ref Range Status   Specimen Description BLOOD BLOOD LEFT HAND  Final   Special Requests NONE  Final   Culture   Final    NO GROWTH < 12 HOURS Performed at [facility], [address], [place], [place] [address]    Report Status PENDING  Incomplete  MRSA Next Gen by PCR, Nasal     Status: Abnormal   Collection Time: 04/14/24  9:39 PM   Specimen: Nasal Mucosa; Nasal Swab  Result Value Ref Range Status   MRSA by PCR Next Gen DETECTED (A) NOT DETECTED Final    Comment: RESULT CALLED TO, READ BACK BY AND VERIFIED WITH: R ESOCE,RN@0014  04/15/24 MK (NOTE) The GeneXpert MRSA Assay (FDA approved for NASAL specimens only), is one component of a comprehensive MRSA colonization surveillance program. It is not intended to diagnose MRSA infection nor to guide or monitor treatment for MRSA  infections. Test performance is not FDA approved in patients less than 4 years old. Performed at [facility], [address] [place], [place] [address]      Radiology Studies: US  EKG SITE RITE Result Date: 04/15/2024 If Site Rite image not attached, placement could not be confirmed due to current cardiac rhythm.  DG Chest Port 1 View Result Date: 04/14/2024 CLINICAL DATA:  Questionable sepsis. EXAM: PORTABLE CHEST 1 VIEW COMPARISON:  Chest radiograph dated 04/06/2024 FINDINGS: Shallow inspiration with bibasilar atelectasis. No consolidative changes. No pleural effusion pneumothorax. Stable cardiac silhouette. No acute osseous pathology. IMPRESSION: Shallow inspiration with bibasilar atelectasis. No focal consolidation. Electronically Signed   By: Vanetta Chou M.D.   On: 04/14/2024 17:30   CT ABDOMEN PELVIS WO CONTRAST Result Date: 04/14/2024 CLINICAL DATA:  Abdominal pain.  Hematemesis. EXAM: CT ABDOMEN AND PELVIS WITHOUT CONTRAST TECHNIQUE: Multidetector CT imaging of the abdomen and pelvis was performed following the standard protocol without IV contrast. RADIATION DOSE REDUCTION: This exam was performed according to the departmental dose-optimization program which includes automated exposure control, adjustment of the mA and/or kV according to patient size and/or use of iterative reconstruction technique. COMPARISON:  Abdominal ultrasound dated 11/04/2021. FINDINGS: Evaluation of this exam is limited in the absence of intravenous contrast. Lower chest: Bibasilar subpleural atelectasis/scarring. There is coronary vascular calcification. No intra-abdominal free air or free fluid. Hepatobiliary: The liver is unremarkable. No biliary dilatation. The gallbladder is unremarkable. Pancreas: Unremarkable. No pancreatic ductal dilatation or surrounding inflammatory changes. Spleen: Normal in size without focal abnormality. Adrenals/Urinary Tract: The adrenal glands unremarkable. There is no  hydronephrosis or nephrolithiasis on either side. Bilateral perinephric stranding, nonspecific. Correlation with urinalysis recommended to exclude UTI. The visualized ureters and urinary bladder appear unremarkable. Stomach/Bowel: There is loose stool throughout the colon suggestive of diarrheal state. Correlation with clinical exam and stool cultures recommended. There is no bowel obstruction or active inflammation. Appendectomy. Vascular/Lymphatic: Moderate aortoiliac atherosclerotic disease.  The IVC is unremarkable. No portal venous gas. There is no adenopathy. Reproductive: The prostate and seminal vesicles are grossly unremarkable. Other: Small fat containing bilateral inguinal hernias. Anterior pelvic wall hernia repair mesh. Musculoskeletal: Osteopenia with degenerative changes of the spine. Status post ORIF of right femoral neck fracture. There is edema in the surrounding soft tissues of the right hip. No fluid collection. IMPRESSION: 1. Diarrheal state. Correlation with clinical exam and stool cultures recommended. No bowel obstruction. 2. No hydronephrosis or nephrolithiasis. 3.  Aortic Atherosclerosis (ICD10-I70.0). Electronically Signed   By: Vanetta Chou M.D.   On: 04/14/2024 17:29    Scheduled Meds:  sodium chloride    Intravenous Once   sodium chloride    Intravenous Once   sodium chloride    Intravenous Once   sodium chloride    Intravenous Once   atorvastatin   40 mg Oral QHS   brimonidine   1 drop Both Eyes BID   Chlorhexidine  Gluconate Cloth  6 each Topical Q0600   Chlorhexidine  Gluconate Cloth  6 each Topical Daily   [START ON 04/16/2024] ferrous sulfate  325 mg Oral Q breakfast   fish oil-omega-3 fatty acids   1 g Oral BID   fluticasone  furoate-vilanterol  1 puff Inhalation Daily   [START ON 04/16/2024] hydrocortisone  sod succinate (SOLU-CORTEF ) inj  40 mg Intravenous Q12H   latanoprost   1 drop Both Eyes QHS   mupirocin  ointment  1 Application Nasal BID   mycophenolate   1,000 mg  Oral BID   pantoprazole  40 mg Intravenous Q12H   sodium zirconium cyclosilicate  10 g Oral TID   sucralfate  1 g Oral TID WC & HS   Continuous Infusions:  sodium chloride      sodium chloride  100 mL/hr at 04/15/24 0833   norepinephrine (LEVOPHED) Adult infusion 3 mcg/min (04/15/24 0513)     LOS: 1 day   Critical Care Procedure Note Authorized and Performed by: KYM Louder MD  Total Critical Care time:  65 mins Due to a high probability of clinically significant, life threatening deterioration, the patient required my highest level of preparedness to intervene emergently and I personally spent this critical care time directly and personally managing the patient.  This critical care time included obtaining a history; examining the patient, pulse oximetry; ordering and review of studies; arranging urgent treatment with development of a management plan; evaluation of patient's response of treatment; frequent reassessment; and discussions with other providers.  This critical care time was performed to assess and manage the high probability of imminent and life threatening deterioration that could result in multi-organ failure.  It was exclusive of separately billable procedures and treating other patients and teaching time.    Afton Louder, MD How to contact the [facility] Attending or Consulting provider 7A - 7P or covering provider during after hours 7P -7A, for this patient?  Check the care team in [facility] and look for a) attending/consulting [facility] provider listed and b) the [facility] team listed Log into [email or web address] to find provider on call.  Locate the [facility] provider you are looking for under [facility] and page to a number that you can be directly reached. If you still have difficulty reaching the provider, please page the [facility] (Director on Call) for the Hospitalists listed on amion for assistance.  04/15/2024, 3:13 PM

## 2024-04-15 NOTE — Progress Notes (Signed)
 Peripherally Inserted Central Catheter Placement  The IV Nurse has discussed with the patient and/or persons authorized to consent for the patient, the purpose of this procedure and the potential benefits and risks involved with this procedure.  The benefits include less needle sticks, lab draws from the catheter, and the patient may be discharged home with the catheter. Risks include, but not limited to, infection, bleeding, blood clot (thrombus formation), and puncture of an artery; nerve damage and irregular heartbeat and possibility to perform a PICC exchange if needed/ordered by physician.  Alternatives to this procedure were also discussed.  Bard Power PICC patient education guide, fact sheet on infection prevention and patient information card has been provided to patient /or left at bedside.    PICC Placement Documentation  PICC Triple Lumen 04/15/24 Right Basilic 40 cm 3 cm (Active)  Indication for Insertion or Continuance of Line Limited venous access - need for IV therapy >5 days (PICC only) 04/15/24 1640  Exposed Catheter (cm) 3 cm 04/15/24 1640  Site Assessment Clean, Dry, Intact 04/15/24 1640  Lumen #1 Status Flushed;Saline locked;Blood return noted 04/15/24 1640  Lumen #2 Status Flushed;Saline locked;Blood return noted 04/15/24 1640  Lumen #3 Status Flushed;Blood return noted;Saline locked 04/15/24 1640  Dressing Type Transparent;Securing device 04/15/24 1640  Dressing Status Antimicrobial disc/dressing in place;Clean, Dry, Intact 04/15/24 1640  Line Care Lumen 1 cap changed;Lumen 2 cap changed;Lumen 3 cap changed;Connections checked and tightened 04/15/24 1640  Dressing Change Due 04/22/24 04/15/24 1640       Jeffrey Stewart CHRISTELLA Bohr 04/15/2024, 5:12 PM

## 2024-04-15 NOTE — Op Note (Signed)
 [facility] Patient Name: Jeffrey Stewart Procedure Date: 04/15/2024 1:11 PM MRN: [id number] Date of Birth: 06-Jun-1935 Attending MD: Toribio Fortune , , [id number] CSN: [id number] Age: 89 Admit Type: Inpatient Procedure:                Upper GI endoscopy Indications:              Hematochezia, hemorrhagic shock Providers:                Toribio Fortune, Rosina Sprague, Leandrew Edelman RN, RN Referring MD:              Medicines:                Monitored Anesthesia Care Complications:            No immediate complications. Estimated Blood Loss:     Estimated blood loss: none. Procedure:                Pre-Anesthesia Assessment:                           - Prior to the procedure, a History and Physical                            was performed, and patient medications, allergies                            and sensitivities were reviewed. The patient's                            tolerance of previous anesthesia was reviewed.                           - The risks and benefits of the procedure and the                            sedation options and risks were discussed with the                            patient. All questions were answered and informed                            consent was obtained.                           - ASA Grade Assessment: III - A patient with severe                            systemic disease.                           After obtaining informed consent, the endoscope was                            passed under direct vision. Throughout the                            procedure, the patient's  blood pressure, pulse, and                            oxygen saturations were monitored continuously. The                            [id number] ([id number]) Upper was introduced through                            the mouth, and advanced to the third part of                            duodenum. The upper GI endoscopy was accomplished                            without difficulty. The  patient tolerated the                            procedure well. Scope In: 1:49:02 PM Scope Out: 2:01:41 PM Total Procedure Duration: 0 hours 12 minutes 39 seconds  Findings:      The examined esophagus was normal.      Three non-bleeding cratered gastric ulcers with a flat pigmented spot       (Forrest Class IIc) were found in the gastric antrum. The largest lesion       was 20 mm in largest dimension. Area was successfully injected with 2 mL       of a 0.1 mg/mL solution of epinephrine  was performed in the edges of the       ulcer (1 mL in two corners). Coagulation on areas of hematin x22 for       hemostasis using bipolar probe was successful. Biopsies from normal       gastric tissue were taken with a cold forceps for Helicobacter pylori       testing.      The examined duodenum was normal. Upon careful inspection, no presence       of hematin or active bleeding was present. Impression:               - Normal esophagus.                           - Non-bleeding gastric ulcers with a flat pigmented                            spot (Forrest Class IIc). Injected. Treated with                            bipolar cautery. Biopsied.                           - Normal examined duodenum. Moderate Sedation:      Per Anesthesia Care Recommendation:           - Return patient to ICU for ongoing care.                           - Clear liquid diet today.                           -  Continue pantoprazole 40 mg IV every 12 hours for                            72 hours.                           - Await pathology results.                           - No ibuprofen, naproxen, or other non-steroidal                            anti-inflammatory drugs.                           - Use sucralfate suspension 1 gram PO QID for 8                            weeks.                           - Repeat upper endoscopy in 2 months for                            surveillance.                           - Repeat H/H  in AM or repeat if he has a large                            bloody BM, or HD unstable.                           - If patient presents recurrent clinical bleeding                            or hemodynamic instability, perform CT Angio bleed                            protocol STAT - consult IR if active bleeding is                            identified.                           - Attempt to wean off Levophed as tolerated. Procedure Code(s):        --- Professional ---                           43255, 59, Esophagogastroduodenoscopy, flexible,                            transoral; with control of bleeding, any method                           43239, Esophagogastroduodenoscopy, flexible,  transoral; with biopsy, single or multiple Diagnosis Code(s):        --- Professional ---                           K25.9, Gastric ulcer, unspecified as acute or                            chronic, without hemorrhage or perforation                           K92.1, Melena (includes Hematochezia) CPT copyright 2022 [employer]. All rights reserved. The codes documented in this report are preliminary and upon coder review may  be revised to meet current compliance requirements. Toribio Fortune, MD Toribio Fortune,  04/15/2024 2:14:26 PM This report has been signed electronically. Number of Addenda: 0

## 2024-04-15 NOTE — Progress Notes (Signed)
 Gastroenterology Progress Note   Patient ID: Jeffrey Stewart; [id number]; 03-Apr-1935    Subjective   Notes abdominal pain intermittently right-sided. No n/v. No hematemesis overnight. He did have 3 large dark red bloody bowel movements overnight, last prior to shift change. Last bleeding was at time of last H/H early this morning. Hgb at that time 6.4. he is receiving his 3rd unit of PRBCs, with first unit overnight started at 0400. Received 1 unit yesterday. Total of 4 units this admission. Per nursing, on levo at 5mcg. MAP was low 50s, now 70s. MAP started improving with blood transfusion  Patient denies chest pain or any change in respiratory status from his baseline. Currently on 2 liters nasal cannula.    Objective   Vital signs in last 24 hours Temp:  [97.7 F (36.5 C)-99 F (37.2 C)] 97.7 F (36.5 C) (09/30 0841) Pulse Rate:  [94-122] 96 (09/30 0841) Resp:  [16-35] 18 (09/30 0841) BP: (86-131)/(33-69) 131/41 (09/30 0841) SpO2:  [92 %-100 %] 100 % (09/30 0815) Weight:  [88 kg] 88 kg (09/29 1359) Last BM Date : 04/15/24  Physical Exam General:   Alert and oriented, acutely ill-appearing, frail Lungs: Clear to auscultation bilaterally, not tachypneic but shallow breathing noted, anxious Abdomen:  Bowel sounds present, soft, mild TTP RUQ non-distended.Round and protuberan Extremities:  With venous stasis changes, right hip/thigh and down to knee with ecchymosis and edema, new gauze dressing on right lateral hip without any seepage and remains dry.  Neurologic:  Alert and  oriented x4  Intake/Output from previous day: 09/29 0701 - 09/30 0700 In: 1884.2 [I.V.:356.2; Blood:592; IV Piggyback:936] Out: 325 [Urine:325] Intake/Output this shift: Total I/O In: 128 [Blood:128] Out: -   Lab Results  Recent Labs    04/14/24 1422 04/14/24 2040 04/15/24 0339  WBC 28.2*  --  30.0*  HGB 6.3* 7.3* 6.4*  HCT 19.7* 22.4* 20.1*  PLT 274  --  291   BMET Recent Labs     04/14/24 1422 04/15/24 0339  NA 136 138  K 4.8 5.7*  CL 101 104  CO2 22 20*  GLUCOSE 166* 212*  BUN 72* 87*  CREATININE 1.62* 2.16*  CALCIUM  7.8* 7.4*   LFT Recent Labs    04/14/24 1422  PROT 4.7*  ALBUMIN  2.6*  AST 26  ALT 13  ALKPHOS 42  BILITOT 1.7*   PT/INR Recent Labs    04/14/24 1422  LABPROT 15.0  INR 1.1    Studies/Results DG Chest Port 1 View Result Date: 04/14/2024 CLINICAL DATA:  Questionable sepsis. EXAM: PORTABLE CHEST 1 VIEW COMPARISON:  Chest radiograph dated 04/06/2024 FINDINGS: Shallow inspiration with bibasilar atelectasis. No consolidative changes. No pleural effusion pneumothorax. Stable cardiac silhouette. No acute osseous pathology. IMPRESSION: Shallow inspiration with bibasilar atelectasis. No focal consolidation. Electronically Signed   By: Vanetta Chou M.D.   On: 04/14/2024 17:30   CT ABDOMEN PELVIS WO CONTRAST Result Date: 04/14/2024 CLINICAL DATA:  Abdominal pain.  Hematemesis. EXAM: CT ABDOMEN AND PELVIS WITHOUT CONTRAST TECHNIQUE: Multidetector CT imaging of the abdomen and pelvis was performed following the standard protocol without IV contrast. RADIATION DOSE REDUCTION: This exam was performed according to the departmental dose-optimization program which includes automated exposure control, adjustment of the mA and/or kV according to patient size and/or use of iterative reconstruction technique. COMPARISON:  Abdominal ultrasound dated 11/04/2021. FINDINGS: Evaluation of this exam is limited in the absence of intravenous contrast. Lower chest: Bibasilar subpleural atelectasis/scarring. There is coronary vascular calcification. No intra-abdominal  free air or free fluid. Hepatobiliary: The liver is unremarkable. No biliary dilatation. The gallbladder is unremarkable. Pancreas: Unremarkable. No pancreatic ductal dilatation or surrounding inflammatory changes. Spleen: Normal in size without focal abnormality. Adrenals/Urinary Tract: The adrenal glands  unremarkable. There is no hydronephrosis or nephrolithiasis on either side. Bilateral perinephric stranding, nonspecific. Correlation with urinalysis recommended to exclude UTI. The visualized ureters and urinary bladder appear unremarkable. Stomach/Bowel: There is loose stool throughout the colon suggestive of diarrheal state. Correlation with clinical exam and stool cultures recommended. There is no bowel obstruction or active inflammation. Appendectomy. Vascular/Lymphatic: Moderate aortoiliac atherosclerotic disease. The IVC is unremarkable. No portal venous gas. There is no adenopathy. Reproductive: The prostate and seminal vesicles are grossly unremarkable. Other: Small fat containing bilateral inguinal hernias. Anterior pelvic wall hernia repair mesh. Musculoskeletal: Osteopenia with degenerative changes of the spine. Status post ORIF of right femoral neck fracture. There is edema in the surrounding soft tissues of the right hip. No fluid collection. IMPRESSION: 1. Diarrheal state. Correlation with clinical exam and stool cultures recommended. No bowel obstruction. 2. No hydronephrosis or nephrolithiasis. 3.  Aortic Atherosclerosis (ICD10-I70.0). Electronically Signed   By: Vanetta Chou M.D.   On: 04/14/2024 17:29    Assessment  89 y.o. male with a history of chronic heart failure, CAD, HLD, ILD,  polymyalgia rheumatica and RA on chronic 6 mg prednisone  daily and chronic cellcept , pulmonary fibrosis, RBBB, mechanical fall last week and found to have right intertrochanteric femur fracture and underwent ORIF with intramedullary rod 04/07/24 at [facility], requiring 1 unit PRBCs while hospitalized due to acute blood loss anemia post procedure,  discharged 9/27 to home at family request instead of SNF, presenting yesterday with acute hematemesis, melena, and acute blood loss anemia.  Acute on chronic anemia: baseline Hgb 10-12 range with presenting Hgb 6.3. Received 1 unit PRBCs yesterday and had continued dark  bloody stool but no further hematemesis, consistent with likely rapid transit UGI bleed,developed hypotension due to hemorrhagic shock and requiring levophed, worsening renal function with renal failure developing and associated hyperkalemia, ncreased lactic acid, requiring additional blood and now receiving 3rd unit PRBCs for total of 4 units this admission. We are checking stat CBC and BMP at 1130. Plans for tentative EGD today if can be stabilized with hyperkalemia improved and vitals stable. Ultimately he may need to be transferred to higher level of care. Continue with plans for scheduled Reglan.   Blood loss multifactorial as he does have significant ecchymosis to right extremity s/p ORIF but does not appear to be actively oozing from this. Predominantly blood loss from GI source. No obvious cirrhosis on CT, no known hx of liver disease.   Markedly elevated WBC count at 30, increased from 28 yesterday, afebrile, blood cultures no growth thus far. Unable to do CTA due to renal status. CT without contrast noting diarrheal state but no active inflammation; nursing staff deny any diarrhea overnight and state predominantly bloody. Recommend stool studies as well as he is immunocompromised.     Plan / Recommendations  Stat CBC and BMP at 1130 Remain NPO Continue supportive measures and transfusion Stool studies Tentatively planning on EGD today; may ultimately need transfer to higher level of care    LOS: 1 day    04/15/2024, 8:46 AM  Therisa MICAEL Stager, PhD, ANP-BC [facility]

## 2024-04-15 NOTE — Anesthesia Postprocedure Evaluation (Signed)
 Anesthesia Post Note  Patient: Jeffrey Stewart  Procedure(s) Performed: EGD (ESOPHAGOGASTRODUODENOSCOPY)  Patient location during evaluation: ICU Anesthesia Type: General Level of consciousness: awake and alert Pain management: pain level controlled Vital Signs Assessment: post-procedure vital signs reviewed and stable Respiratory status: spontaneous breathing, nonlabored ventilation, respiratory function stable and patient connected to nasal cannula oxygen Cardiovascular status: stable Anesthetic complications: no   There were no known notable events for this encounter.   Last Vitals:  Vitals:   04/15/24 1430 04/15/24 1445  BP: (!) 92/46 97/64  Pulse: 88 86  Resp:    Temp:    SpO2: 98% 98%    Last Pain:  Vitals:   04/15/24 1409  TempSrc:   PainSc: 0-No pain                 Nneka Blanda L Venessa Wickham

## 2024-04-15 NOTE — Hospital Course (Addendum)
 89 y.o. male with medical history significant for dCHF, interstitial lung disease, RBBB, rheumatoid arthritis, CAD. Patient was brought to the ED via EMS with reports of vomiting of blood, and blood in his stools that started this morning.  Spouse is at bedside and confirms that patients vomitus contained golf sized blood clots.  He is not aware of having any black stools.  Reports some lower abdominal pain earlier today that has since resolved.  He has history of hemorrhoids which occasionally bleed, but not to this extent, he has never vomited blood.  He denies NSAID use.  EMS reports blood pressure transiently down to 80s and route.   Recently hospitalized 9/29 to 9/27 for hip fracture-underwent ORIF 03/2021.  Also with acute blood loss anemia postprocedure, requiring 1 unit PRBC.  Hemoglobin on discharge was 7.1.  Refused SNF and was discharged to home.  He is not on full dose anticoagulation.   ED Course: Temperature 98.2.  Tachycardic heart rate 94-116.  Respiratory rate 18- 35.  Blood pressure systolic 94-115.  O2 sat greater than 92% on room air. Hemoglobin 6.3.  WBC 28.2.  Lactic acid 3.9.  CTAP W OC-diarrheal state, correlation with clinical exam and stool cultures recommended.  EDP talked to Dr. Cindie, n.p.o., IV PPI twice daily, plan for EGD 9/30.  His hospitalization was prolonged secondary to hemorrhagic shock.  During the hospitalization, the patient's hematemesis resolved.  He underwent EGD which showed 3 nonbleeding cratered gastric ulcers.  These were injected with epinephrine  and treated with bipolar cautery.  He was started on Protonix IV twice daily and Carafate.  He required transfusion with 5 units PRBC total.  He had prolonged hypotension and required Levophed drip. The patient did have a bloody bowel movement after the EGD.  CTA bleeding scan of the abdomen was negative for active bleed.  He was monitored closely.  His hemoglobin stabilized.  He did not have any further bloody bowel  movements.  He was started on low-dose midodrine.  Ultimately, his blood pressure stabilized off Levophed and his hemoglobin remained stable.  CT chest did reveal a new irregular nodular density in the right upper lobe which needs outpatient follow-up and surveillance.  This was treated empirically as aspiration pneumonitis.

## 2024-04-15 NOTE — Transfer of Care (Signed)
 Immediate Anesthesia Transfer of Care Note  Patient: Jeffrey Stewart  Procedure(s) Performed: EGD (ESOPHAGOGASTRODUODENOSCOPY)  Patient Location: PACU  Anesthesia Type:MAC  Level of Consciousness: awake and alert   Airway & Oxygen Therapy: Patient Spontanous Breathing and Patient connected to nasal cannula oxygen  Post-op Assessment: Report given to RN and Post -op Vital signs reviewed and stable  Post vital signs: Reviewed and stable  Last Vitals:  Vitals Value Taken Time  BP 160/146 04/15/24 14:09  Temp    Pulse 60 04/15/24 14:10  Resp 13 04/15/24 14:10  SpO2 99 % 04/15/24 14:10  Vitals shown include unfiled device data.  Last Pain:  Vitals:   04/15/24 1346  TempSrc:   PainSc: 0-No pain      Patients Stated Pain Goal: 0 (04/15/24 0800)  Complications: No notable events documented.

## 2024-04-15 NOTE — Plan of Care (Signed)

## 2024-04-15 NOTE — Anesthesia Preprocedure Evaluation (Addendum)
 Anesthesia Evaluation  Patient identified by MRN, date of birth, ID band Patient awake    Reviewed: Allergy & Precautions, H&P , NPO status , Patient's Chart, lab work & pertinent test results, reviewed documented beta blocker date and time   Airway Mallampati: II  TM Distance: >3 FB Neck ROM: full    Dental  (+) Edentulous Upper, Edentulous Lower   Pulmonary shortness of breath, asthma , pneumonia, resolved, COPD Severe interstitial lung disease with shortness of breath   Pulmonary exam normal breath sounds clear to auscultation       Cardiovascular pulmonary hypertension+ CAD, + Past MI and +CHF  Normal cardiovascular exam+ dysrhythmias  Rhythm:regular Rate:Normal  Most recent echo shows EF 50% with global hypokinesis left and right ventricles and grade 1 diastolic dysfunction.  He has sig CAD as well but no evidence of it being symptomatic. Mild pulmonary hypertension   Neuro/Psych negative neurological ROS  negative psych ROS   GI/Hepatic negative GI ROS, Neg liver ROS,,,GI bleeding with acute anemia   Endo/Other  diabetes, Type 2    Renal/GU CRF and ARFRenal diseaseBUN 87 and creat 2.06 but I dont know how much is chronic vs. Acute renal failure  negative genitourinary   Musculoskeletal  (+) Arthritis , Rheumatoid disorders,    Abdominal   Peds  Hematology  (+) Blood dyscrasia, anemia Hgb up to 9.5 now after transfusion ( up from 6.4 )   Anesthesia Other Findings Hyperkalemia but is 5.2 now  Lactic acid is 2.3 down from 5.7  Reproductive/Obstetrics negative OB ROS                              Anesthesia Physical Anesthesia Plan  ASA: 4  Anesthesia Plan: General   Post-op Pain Management: Minimal or no pain anticipated   Induction: Intravenous  PONV Risk Score and Plan: Propofol  infusion  Airway Management Planned: Natural Airway and Nasal Cannula  Additional Equipment:  None  Intra-op Plan:   Post-operative Plan:   Informed Consent: I have reviewed the patients History and Physical, chart, labs and discussed the procedure including the risks, benefits and alternatives for the proposed anesthesia with the patient or authorized representative who has indicated his/her understanding and acceptance.     Dental Advisory Given  Plan Discussed with: CRNA, Surgeon and Anesthesiologist  Anesthesia Plan Comments:          Anesthesia Quick Evaluation

## 2024-04-15 NOTE — TOC Initial Note (Signed)
 Transition of Care [facility]) - Initial/Assessment Note    Patient Details  Name: Jeffrey Stewart MRN: [id number] Date of Birth: [birth year]  Transition of Care [facility] CM/SW Contact:    Lucie Lunger, LCSWA Phone Number: 04/15/2024, 11:44 AM  Clinical Narrative:                 Pt is high risk for readmission. CSW spoke with pt, spouse and daughter at bedside to complete assessment. Pt lives with his wife. Pt was independent prior to fracture from fall. Pt has transportation when needed. Pt has a cane, walker, wheelchair, bedside commode, shower bench, and grab bars. Pt was set up with [facility] services through [facility] at hospital discharge on 9/27. Pts family states they would prefer [facility] if possible as they have used them in the past and had good results. CSW sent message to [place] with [facility] to request that order be cancelled. CSW sent new HH referral to [facility] with [facility], they can accept. TOC to follow.   Expected Discharge Plan: Home w Home Health Services Barriers to Discharge: Continued Medical Work up   Patient Goals and CMS Choice Patient states their goals for this hospitalization and ongoing recovery are:: return home CMS Medicare.gov Compare Post Acute Care list provided to:: Patient Choice offered to / list presented to : Patient, Spouse, Adult Children      Expected Discharge Plan and Services In-house Referral: Clinical Social Work Discharge Planning Services: CM Consult Post Acute Care Choice: Home Health Living arrangements for the past 2 months: Single Family Home                             HH Agency: [facility] Date [facility] Agency Contacted: 04/15/24   Representative spoke with at [facility]: [place]  Prior Living Arrangements/Services Living arrangements for the past 2 months: Single Family Home Lives with:: Spouse Patient language and need for interpreter reviewed:: Yes Do you feel safe going back to the place where you live?: Yes       Need for Family Participation in Patient Care: Yes (Comment) Care giver support system in place?: Yes (comment) Current home services: DME Criminal Activity/Legal Involvement Pertinent to Current Situation/Hospitalization: No - Comment as needed  Activities of Daily Living   ADL Screening (condition at time of admission) Independently performs ADLs?: No Does the patient have a NEW difficulty with bathing/dressing/toileting/self-feeding that is expected to last >3 days?: Yes (Initiates electronic notice to provider for possible OT consult) Does the patient have a NEW difficulty with getting in/out of bed, walking, or climbing stairs that is expected to last >3 days?: Yes (Initiates electronic notice to provider for possible PT consult) Does the patient have a NEW difficulty with communication that is expected to last >3 days?: Yes (Initiates electronic notice to provider for possible SLP consult) Is the patient deaf or have difficulty hearing?: No Does the patient have difficulty seeing, even when wearing glasses/contacts?: No Does the patient have difficulty concentrating, remembering, or making decisions?: No  Permission Sought/Granted                  Emotional Assessment Appearance:: Appears stated age Attitude/Demeanor/Rapport: Engaged Affect (typically observed): Accepting Orientation: : Oriented to Self, Oriented to Place, Oriented to  Time, Oriented to Situation Alcohol  / Substance Use: Not Applicable Psych Involvement: No (comment)  Admission diagnosis:  Acute GI bleeding [K92.2] Upper GI bleed [K92.2] AKI (acute kidney injury) [N17.9]  Symptomatic anemia [D64.9] Patient Active Problem List   Diagnosis Date Noted   Acute GI bleeding 04/14/2024   Decubital ulcer 04/14/2024   ABLA (acute blood loss anemia) 04/09/2024   Closed displaced intertrochanteric fracture of right femur (HCC) 04/07/2024   Malnutrition of moderate degree 04/07/2024   Hip fracture (HCC)  04/06/2024   Chronic combined systolic (congestive) and diastolic (congestive) heart failure (HCC) 04/06/2024   ILD (interstitial lung disease) (HCC) 04/06/2024   Glaucoma (increased eye pressure) 04/06/2024   Parotid mass 04/06/2024   Neck pain 04/06/2024   Pneumonia 09/07/2023   Rheumatoid arthritis (HCC) 09/07/2023   AKI (acute kidney injury) 09/07/2023   Thrombocytopenia 04/25/2021   Macrocytosis 01/21/2021   Bilateral inguinal hernia 09/19/2013   RBBB (right bundle branch block) 05/08/2011   CAD (coronary artery disease) 10/27/2010   CHF (congestive heart failure) (HCC) 10/27/2010   Hyperlipidemia 10/27/2010   PCP:  Anita Bernardino [name], FNP Pharmacy:   [facility] [phone] - [place], [place] - [address] [place] [address] Phone: [phone] Fax: [phone]  [place] [facility] [address] [place] [address] Phone: [phone] Fax: [phone]  [facility] - [place], [place] - [address] [place] [address] Phone: [phone] Fax: [phone]  [facility] [address] [place] [address] Phone: [phone] Fax: [phone]     Social Drivers of Health (SDOH) Social History: SDOH Screenings   Food Insecurity: No Food Insecurity (04/15/2024)  Housing: Low Risk  (04/15/2024)  Transportation Needs: No Transportation Needs (04/15/2024)  Utilities: Not At Risk (04/15/2024)  Social Connections: Moderately Integrated (04/15/2024)  Tobacco Use: Low Risk  (04/14/2024)   SDOH Interventions:     Readmission Risk Interventions    04/15/2024   11:41 AM 09/10/2023   12:52 PM  Readmission Risk Prevention Plan  Transportation Screening Complete Complete  PCP or Specialist Appt within 5-7 Days  Complete  Home Care Screening  Complete  Medication Review (RN CM)  Complete  HRI or Home Care  Consult Complete   Social Work Consult for Recovery Care Planning/Counseling Complete   Palliative Care Screening Not Applicable   Medication Review Oceanographer) Complete

## 2024-04-15 NOTE — Brief Op Note (Signed)
 04/15/2024  2:12 PM  PATIENT:  Jeffrey Stewart  89 y.o. male  PRE-OPERATIVE DIAGNOSIS:  hematemesis and melena  POST-OPERATIVE DIAGNOSIS:  gastric ulcerx3(epi 2ml, Gold probe)  PROCEDURE:  Procedure(s): EGD (ESOPHAGOGASTRODUODENOSCOPY) (N/A)  SURGEON:  Surgeons and Role:    * Eartha Angelia Sieving, MD - Primary  Patient underwent EGD under propofol  sedation.  Tolerated the procedure adequately.   FINDINGS: - Normal esophagus.  - Non-bleeding gastric ulcers with a flat pigmented spot (Forrest Class IIc).  Injected.  Treated with bipolar cautery.  Biopsied.  - Normal examined duodenum.   RECOMMENDATIONS - Return patient to ICU for ongoing care.  - Clear liquid diet today.  - Continue pantoprazole 40 mg IV every 12 hours for 72 hours. - Await pathology results.  - No ibuprofen, naproxen, or other non-steroidal anti-inflammatory drugs.  - Use sucralfate suspension 1 gram PO QID for 8 weeks.  - Repeat upper endoscopy in 2 months for surveillance.  - Repeat H/H in AM or repeat if he has a large bloody BM, or HD unstable. - If patient presents recurrent clinical bleeding or hemodynamic instability, perform CT Angio bleed protocol STAT - consult IR if active bleeding is identified. - Attempt to wean off Levophed as tolerated.  Sieving Eartha, MD Gastroenterology and Hepatology [facility]

## 2024-04-16 ENCOUNTER — Inpatient Hospital Stay (HOSPITAL_COMMUNITY)

## 2024-04-16 ENCOUNTER — Encounter (HOSPITAL_COMMUNITY): Payer: Self-pay | Admitting: Gastroenterology

## 2024-04-16 DIAGNOSIS — J849 Interstitial pulmonary disease, unspecified: Secondary | ICD-10-CM | POA: Diagnosis not present

## 2024-04-16 DIAGNOSIS — K922 Gastrointestinal hemorrhage, unspecified: Secondary | ICD-10-CM | POA: Diagnosis not present

## 2024-04-16 DIAGNOSIS — I5032 Chronic diastolic (congestive) heart failure: Secondary | ICD-10-CM | POA: Diagnosis not present

## 2024-04-16 DIAGNOSIS — R578 Other shock: Secondary | ICD-10-CM | POA: Diagnosis not present

## 2024-04-16 LAB — CBC WITH DIFFERENTIAL/PLATELET
Abs Immature Granulocytes: 1.2 K/uL — ABNORMAL HIGH (ref 0.00–0.07)
Basophils Absolute: 0 K/uL (ref 0.0–0.1)
Basophils Relative: 0 %
Eosinophils Absolute: 0 K/uL (ref 0.0–0.5)
Eosinophils Relative: 0 %
HCT: 24.6 % — ABNORMAL LOW (ref 39.0–52.0)
Hemoglobin: 7.8 g/dL — ABNORMAL LOW (ref 13.0–17.0)
Lymphocytes Relative: 14 %
Lymphs Abs: 2.5 K/uL (ref 0.7–4.0)
MCH: 31.2 pg (ref 26.0–34.0)
MCHC: 31.7 g/dL (ref 30.0–36.0)
MCV: 98.4 fL (ref 80.0–100.0)
Metamyelocytes Relative: 3 %
Monocytes Absolute: 0.4 K/uL (ref 0.1–1.0)
Monocytes Relative: 2 %
Myelocytes: 4 %
Neutro Abs: 13.6 K/uL — ABNORMAL HIGH (ref 1.7–7.7)
Neutrophils Relative %: 77 %
Platelets: 188 K/uL (ref 150–400)
RBC: 2.5 MIL/uL — ABNORMAL LOW (ref 4.22–5.81)
RDW: 20.9 % — ABNORMAL HIGH (ref 11.5–15.5)
Smear Review: NORMAL
WBC: 17.7 K/uL — ABNORMAL HIGH (ref 4.0–10.5)
nRBC: 1.6 % — ABNORMAL HIGH (ref 0.0–0.2)

## 2024-04-16 LAB — HEMOGLOBIN AND HEMATOCRIT, BLOOD
HCT: 24.4 % — ABNORMAL LOW (ref 39.0–52.0)
Hemoglobin: 7.7 g/dL — ABNORMAL LOW (ref 13.0–17.0)

## 2024-04-16 LAB — BASIC METABOLIC PANEL WITH GFR
Anion gap: 11 (ref 5–15)
BUN: 81 mg/dL — ABNORMAL HIGH (ref 8–23)
CO2: 20 mmol/L — ABNORMAL LOW (ref 22–32)
Calcium: 7.4 mg/dL — ABNORMAL LOW (ref 8.9–10.3)
Chloride: 108 mmol/L (ref 98–111)
Creatinine, Ser: 1.8 mg/dL — ABNORMAL HIGH (ref 0.61–1.24)
GFR, Estimated: 36 mL/min — ABNORMAL LOW (ref >60)
Glucose, Bld: 162 mg/dL — ABNORMAL HIGH (ref 70–99)
Potassium: 4.4 mmol/L (ref 3.5–5.1)
Sodium: 139 mmol/L (ref 135–145)

## 2024-04-16 LAB — PROCALCITONIN: Procalcitonin: 0.79 ng/mL

## 2024-04-16 NOTE — Progress Notes (Addendum)
 Subjective: Feeling okay this morning, reports he was able to get some sleep last night. No BMs so far. Denies abdominal pain, nausea or vomiting. Feels a little lightheaded. Was able to consume some of his liquid breakfast this morning.   Objective: Vital signs in last 24 hours: Temp:  [97.8 F (36.6 C)-98.6 F (37 C)] 98.5 F (36.9 C) (10/01 0742) Pulse Rate:  [60-108] 94 (10/01 0600) Resp:  [13-27] 18 (10/01 0600) BP: (85-168)/(32-158) 113/44 (10/01 0600) SpO2:  [91 %-100 %] 99 % (10/01 0600) Last BM Date : 04/15/24 General:   Alert and oriented, pleasant Head:  Normocephalic and atraumatic. Eyes:  No icterus, sclera clear. Conjuctiva pink.  Mouth:  Without lesions, mucosa pink and moist.  Heart:  S1, S2 present, no murmurs noted.  Lungs: Clear to auscultation bilaterally, without wheezing, rales, or rhonchi.  Abdomen:  Bowel sounds present, soft, non-tender, non-distended. No HSM or hernias noted. No rebound or guarding. No masses appreciated  Neurologic:  Alert and  oriented x4;  grossly normal neurologically. Skin:  Warm and dry, intact without significant lesions.  Psych:  Alert and cooperative. Normal mood and affect.  Intake/Output from previous day: 09/30 0701 - 10/01 0700 In: 2848.5 [I.V.:2325.8; Blood:522.7] Out: 750 [Urine:750] Intake/Output this shift: No intake/output data recorded.  Lab Results: Recent Labs    04/15/24 0339 04/15/24 1154 04/16/24 0506  WBC 30.0* 25.1* 17.7*  HGB 6.4* 9.5* 7.8*  HCT 20.1* 28.5* 24.6*  PLT 291 239 188   BMET Recent Labs    04/15/24 0339 04/15/24 1154 04/16/24 0506  NA 138 140 139  K 5.7* 5.2* 4.4  CL 104 107 108  CO2 20* 21* 20*  GLUCOSE 212* 175* 162*  BUN 87* 87* 81*  CREATININE 2.16* 2.06* 1.80*  CALCIUM  7.4* 7.6* 7.4*   LFT Recent Labs    04/14/24 1422  PROT 4.7*  ALBUMIN  2.6*  AST 26  ALT 13  ALKPHOS 42  BILITOT 1.7*   PT/INR Recent Labs    04/14/24 1422  LABPROT 15.0  INR 1.1     Studies/Results: DG Chest Portable 1 View Result Date: 04/15/2024 CLINICAL DATA:  PICC placement. EXAM: PORTABLE CHEST 1 VIEW COMPARISON:  April 14, 2024. FINDINGS: Stable cardiomediastinal silhouette. Right-sided PICC line is noted with distal tip in expected position of the SVC. Minimal bibasilar subsegmental atelectasis or scarring is noted with probable left basilar pleural thickening. New irregular nodular density seen in right upper lobe; CT scan of the chest is recommended for further evaluation. IMPRESSION: 1. Right-sided PICC line is noted with distal tip in expected position of the SVC. 2. New irregular nodular density seen in right upper lobe; CT scan of the chest is recommended for further evaluation. Electronically Signed   By: Lynwood Landy Raddle M.D.   On: 04/15/2024 17:10   US  EKG SITE RITE Result Date: 04/15/2024 If Site Rite image not attached, placement could not be confirmed due to current cardiac rhythm.  DG Chest Port 1 View Result Date: 04/14/2024 CLINICAL DATA:  Questionable sepsis. EXAM: PORTABLE CHEST 1 VIEW COMPARISON:  Chest radiograph dated 04/06/2024 FINDINGS: Shallow inspiration with bibasilar atelectasis. No consolidative changes. No pleural effusion pneumothorax. Stable cardiac silhouette. No acute osseous pathology. IMPRESSION: Shallow inspiration with bibasilar atelectasis. No focal consolidation. Electronically Signed   By: Vanetta Chou M.D.   On: 04/14/2024 17:30   CT ABDOMEN PELVIS WO CONTRAST Result Date: 04/14/2024 CLINICAL DATA:  Abdominal pain.  Hematemesis. EXAM: CT ABDOMEN AND PELVIS WITHOUT CONTRAST  TECHNIQUE: Multidetector CT imaging of the abdomen and pelvis was performed following the standard protocol without IV contrast. RADIATION DOSE REDUCTION: This exam was performed according to the departmental dose-optimization program which includes automated exposure control, adjustment of the mA and/or kV according to patient size and/or use of iterative  reconstruction technique. COMPARISON:  Abdominal ultrasound dated 11/04/2021. FINDINGS: Evaluation of this exam is limited in the absence of intravenous contrast. Lower chest: Bibasilar subpleural atelectasis/scarring. There is coronary vascular calcification. No intra-abdominal free air or free fluid. Hepatobiliary: The liver is unremarkable. No biliary dilatation. The gallbladder is unremarkable. Pancreas: Unremarkable. No pancreatic ductal dilatation or surrounding inflammatory changes. Spleen: Normal in size without focal abnormality. Adrenals/Urinary Tract: The adrenal glands unremarkable. There is no hydronephrosis or nephrolithiasis on either side. Bilateral perinephric stranding, nonspecific. Correlation with urinalysis recommended to exclude UTI. The visualized ureters and urinary bladder appear unremarkable. Stomach/Bowel: There is loose stool throughout the colon suggestive of diarrheal state. Correlation with clinical exam and stool cultures recommended. There is no bowel obstruction or active inflammation. Appendectomy. Vascular/Lymphatic: Moderate aortoiliac atherosclerotic disease. The IVC is unremarkable. No portal venous gas. There is no adenopathy. Reproductive: The prostate and seminal vesicles are grossly unremarkable. Other: Small fat containing bilateral inguinal hernias. Anterior pelvic wall hernia repair mesh. Musculoskeletal: Osteopenia with degenerative changes of the spine. Status post ORIF of right femoral neck fracture. There is edema in the surrounding soft tissues of the right hip. No fluid collection. IMPRESSION: 1. Diarrheal state. Correlation with clinical exam and stool cultures recommended. No bowel obstruction. 2. No hydronephrosis or nephrolithiasis. 3.  Aortic Atherosclerosis (ICD10-I70.0). Electronically Signed   By: Vanetta Chou M.D.   On: 04/14/2024 17:29    Assessment: 89 year old male with history of chronic heart failure, CAD, dyslipidemia, ILD, polymyalgia  rheumatica and RA chronically on 6 mg of prednisone  daily and CellCept , who suffered a mechanical fall last week, found to have right intertrochanteric femur fracture status post ORIF with rod placement 04/07/2024. During most recent hospitalization required 1 units PRBCs due to acute blood loss anemia from the procedure. Discharged home 04/12/2024. Presented to [facility] ER with hematemesis and melena. Hemoglobin found to be 6.3 down from discharge to 7.1.   Hematemesis and anemia: -hgb 6.3 on admission down from 7.1 on 9/27 -hgb is 7.8 this AM, down from 9.5 yesterday, s/p 4 units PRBCs  -suspect some aspect of hemodilution in setting of ongoing IVF, other cell lines down, also some equilibration possible -2 episodes of hematemesis with clots prior to admission but no further episodes  -reported dark stools previously, no BMs so far this morning -CT abdomen pelvis without contrast yesterday showed diarrheal state, otherwise unremarkable.  -started on IV protonix  -EGD yesterday with Non-bleeding gastric ulcers with a flat pigmented  spot (Forrest Class IIc). Injected. Treated with bipolar cautery. Biopsied. -BP this morning soft but stable, remains on Levophed at 3mcg/min   Plan: Continue PPI q12 hours Avoid all NSAIDs Carafate 1g QID x8weeks Repeat EGD 2 months Monitor for overt GI bleeding Trend  h&h, will repeat around 1300 this afternoon Recommend transfusion of another unit of PRBCs if hgb remains around 7 Will need to obtain stat CT Angio GI bleed study if overt bleeding, hemodynamic instability IR consult if active bleeding identified    LOS: 2 days    04/16/2024, 8:54 AM   Anyely Cunning L. Rosaisela Jamroz, MSN, APRN, AGNP-C Adult-Gerontology Nurse Practitioner [facility] at [facility]

## 2024-04-16 NOTE — Progress Notes (Signed)
 PROGRESS NOTE  Jeffrey Stewart FMW:[id number] DOB: October 13, 1934 DOA: 04/14/2024 PCP: Anita Bernardino [name], FNP  Brief History:  89 y.o. male with medical history significant for dCHF, interstitial lung disease, RBBB, rheumatoid arthritis, CAD. Patient was brought to the ED via EMS with reports of vomiting of blood, and blood in his stools that started this morning.  Spouse is at bedside and confirms that patients vomitus contained golf sized blood clots.  He is not aware of having any black stools.  Reports some lower abdominal pain earlier today that has since resolved.  He has history of hemorrhoids which occasionally bleed, but not to this extent, he has never vomited blood.  He denies NSAID use.  EMS reports blood pressure transiently down to 80s and route.   Recently hospitalized 9/29 to 9/27 for hip fracture-underwent ORIF 03/2021.  Also with acute blood loss anemia postprocedure, requiring 1 unit PRBC.  Hemoglobin on discharge was 7.1.  Refused SNF and was discharged to home.  He is not on full dose anticoagulation.   ED Course: Temperature 98.2.  Tachycardic heart rate 94-116.  Respiratory rate 18- 35.  Blood pressure systolic 94-115.  O2 sat greater than 92% on room air. Hemoglobin 6.3.  WBC 28.2.  Lactic acid 3.9.  CTAP W OC-diarrheal state, correlation with clinical exam and stool cultures recommended.  EDP talked to Dr. Cindie, n.p.o., IV PPI twice daily, plan for EGD 9/30.   Assessment/Plan: Hemorrhagic Shock Acute blood loss anemia Multiple gastric ulcers  -- pt had blood BMs/hematemesis, transferred to ICU started on pressor support for hypotension -- pt had another large blood BM this morning -- 4 units PRBC transfused this admisson -- once stabilized, he was taken to EGD with Dr. Eartha -- he remains on levophed 3 mcg/kg/min -- monitoring H/H -- 9/30 EGD--3x nonbleeding cratered gastric ulcers injected with epi and bipolar cautery -- pantoprazole 40 mg IV BID x 72 hours  per GI recommendations -- sucralfate suspension QID for 8 weeks -- avoid all NSAIDS--pt was on ASA 81 mg bid PTA -- no further bloody BMs  -- if he has recurrent bleeding or hemodynamic instability perform STAT CT angio bleed protocol, consult IR if active bleed identified -- working to wean IV norepinephrine as able  -- no sign of active bleed presently, drop hgb likely dilution   Acute on CKD stage 3a Lactic acidosis Hyperkalemia -- treated with IV dextrose  and insulin  -- down to 5.2 -- given lokelma 10 g 2 doses -- baseline creatinine 0.9-1.2 -- continue IV fluid hydration  -- check bladder scan, renal US  -- treating hypotension with IV norepinephrine to improve renal perfusion -- follow BMP  -- lactic acidosis trending down with IV fluid and improving low blood pressure   Leukemoid reaction -- multifactorial including steroids, stress demargination, transfusions   New irregular nodular density seen in right upper lobe  -- CT chest   Glaucoma -- resumed home eye drops   Chronic HFpEF -2/923 Echo EF 50%, G1DD, mod decrease RVF -holding torsemide  temporarily   Right hip fracture- 9/29 to 9/27 for hip fracture-underwent ORIF 03/2021. Right thigh has extensive ecchymosis, likely contributed to the anemia. - Hold aspirin  and all anticoagulation    Stage I decubitus ulcer -- skin care protocol   Rheumatoid arthritis/interstitial lung disease- On chronic prednisone  5 mg a.m. and 1 mg p.m. and mycophenolate . - Resume mycophenolate  - remains on stress dose steroids  Family Communication:   spouse/DTR at bedside 10/1  Consultants:  GI  Code Status:  FULL  DVT Prophylaxis:  SCDs   Procedures: As Listed in Progress Note Above  Antibiotics: None     Subjective: Patient denies fevers, chills, headache, chest pain, dyspnea, nausea, vomiting, diarrhea, abdominal pain, dysuria, hematuria, hematochezia, and melena.   Objective: Vitals:    04/16/24 0945 04/16/24 1000 04/16/24 1013 04/16/24 1015  BP: (!) 115/50 (!) 109/48 (!) 118/50 (!) 118/50  Pulse: 93 94 95 99  Resp: (!) 23 (!) 22 18 17   Temp:      TempSrc:      SpO2: 100% 99% 100% 100%  Weight:      Height:        Intake/Output Summary (Last 24 hours) at 04/16/2024 1030 Last data filed at 04/16/2024 0526 Gross per 24 hour  Intake 2720.5 ml  Output 750 ml  Net 1970.5 ml   Weight change:  Exam:  General:  Pt is alert, follows commands appropriately, not in acute distress HEENT: No icterus, No thrush, No neck mass, [place]/AT Cardiovascular: RRR, S1/S2, no rubs, no gallops Respiratory: bibasilar rales.  No wheeze Abdomen: Soft/+BS, non tender, non distended, no guarding Extremities: trace nonpitting LE edema, No lymphangitis, No petechiae, No rashes, no synovitis   Data Reviewed: I have personally reviewed following labs and imaging studies Basic Metabolic Panel: Recent Labs  Lab 04/12/24 0618 04/14/24 1422 04/15/24 0339 04/15/24 1154 04/16/24 0506  NA 137 136 138 140 139  K 4.8 4.8 5.7* 5.2* 4.4  CL 100 101 104 107 108  CO2 29 22 20* 21* 20*  GLUCOSE 121* 166* 212* 175* 162*  BUN 40* 72* 87* 87* 81*  CREATININE 1.20 1.62* 2.16* 2.06* 1.80*  CALCIUM  8.4* 7.8* 7.4* 7.6* 7.4*  MG  --   --  2.4  --   --   PHOS  --   --  4.7*  --   --    Liver Function Tests: Recent Labs  Lab 04/14/24 1422  AST 26  ALT 13  ALKPHOS 42  BILITOT 1.7*  PROT 4.7*  ALBUMIN  2.6*   Recent Labs  Lab 04/14/24 1422  LIPASE 30   No results for input(s): AMMONIA in the last 168 hours. Coagulation Profile: Recent Labs  Lab 04/14/24 1422  INR 1.1   CBC: Recent Labs  Lab 04/12/24 0618 04/14/24 1422 04/14/24 2040 04/15/24 0339 04/15/24 1154 04/16/24 0506  WBC 8.8 28.2*  --  30.0* 25.1* 17.7*  NEUTROABS  --  25.9*  --   --  19.1* 13.6*  HGB 7.1* 6.3* 7.3* 6.4* 9.5* 7.8*  HCT 21.6* 19.7* 22.4* 20.1* 28.5* 24.6*  MCV 106.9* 113.2*  --  105.2* 95.6 98.4  PLT  132* 274  --  291 239 188   Cardiac Enzymes: No results for input(s): CKTOTAL, CKMB, CKMBINDEX, TROPONINI in the last 168 hours. BNP: Invalid input(s): POCBNP CBG: Recent Labs  Lab 04/14/24 2210 04/15/24 0541 04/15/24 1342  GLUCAP 175* 221* 154*   HbA1C: No results for input(s): HGBA1C in the last 72 hours. Urine analysis:    Component Value Date/Time   COLORURINE YELLOW 04/14/2024 2215   APPEARANCEUR HAZY (A) 04/14/2024 2215   LABSPEC 1.020 04/14/2024 2215   PHURINE 5.0 04/14/2024 2215   GLUCOSEU NEGATIVE 04/14/2024 2215   HGBUR NEGATIVE 04/14/2024 2215   BILIRUBINUR NEGATIVE 04/14/2024 2215   KETONESUR NEGATIVE 04/14/2024 2215   PROTEINUR NEGATIVE 04/14/2024 2215   UROBILINOGEN 1.0 09/24/2013 0905  NITRITE NEGATIVE 04/14/2024 2215   LEUKOCYTESUR NEGATIVE 04/14/2024 2215   Sepsis Labs: @LABRCNTIP (procalcitonin:4,lacticidven:4) ) Recent Results (from the past 240 hours)  Surgical pcr screen     Status: Abnormal   Collection Time: 04/07/24  7:36 AM   Specimen: Nasal Mucosa; Nasal Swab  Result Value Ref Range Status   MRSA, PCR POSITIVE (A) NEGATIVE Final    Comment: RESULT CALLED TO, READ BACK BY AND VERIFIED WITH: RN angel m [phone] fcp    Staphylococcus aureus POSITIVE (A) NEGATIVE Final    Comment: (NOTE) The Xpert SA Assay (FDA approved for NASAL specimens in patients 81 years of age and older), is one component of a comprehensive surveillance program. It is not intended to diagnose infection nor to guide or monitor treatment. Performed at [facility], [address], [place], [place] [address]   Resp panel by RT-PCR (RSV, Flu A&B, Covid) Anterior Nasal Swab     Status: None   Collection Time: 04/14/24  2:03 PM   Specimen: Anterior Nasal Swab  Result Value Ref Range Status   SARS Coronavirus 2 by RT PCR NEGATIVE NEGATIVE Final    Comment: (NOTE) SARS-CoV-2 target nucleic acids are NOT DETECTED.  The SARS-CoV-2 RNA is generally  detectable in upper respiratory specimens during the acute phase of infection. The lowest concentration of SARS-CoV-2 viral copies this assay can detect is 138 copies/mL. A negative result does not preclude SARS-Cov-2 infection and should not be used as the sole basis for treatment or other patient management decisions. A negative result may occur with  improper specimen collection/handling, submission of specimen other than nasopharyngeal swab, presence of viral mutation(s) within the areas targeted by this assay, and inadequate number of viral copies(<138 copies/mL). A negative result must be combined with clinical observations, patient history, and epidemiological information. The expected result is Negative.  Fact Sheet for Patients:  BloggerCourse.com  Fact Sheet for Healthcare Providers:  SeriousBroker.it  This test is no t yet approved or cleared by the [place]  FDA and  has been authorized for detection and/or diagnosis of SARS-CoV-2 by FDA under an Emergency Use Authorization (EUA). This EUA will remain  in effect (meaning this test can be used) for the duration of the COVID-19 declaration under Section 564(b)(1) of the Act, 21 U.S.C.section 360bbb-3(b)(1), unless the authorization is terminated  or revoked sooner.       Influenza A by PCR NEGATIVE NEGATIVE Final   Influenza B by PCR NEGATIVE NEGATIVE Final    Comment: (NOTE) The Xpert Xpress SARS-CoV-2/FLU/RSV plus assay is intended as an aid in the diagnosis of influenza from Nasopharyngeal swab specimens and should not be used as a sole basis for treatment. Nasal washings and aspirates are unacceptable for Xpert Xpress SARS-CoV-2/FLU/RSV testing.  Fact Sheet for Patients: BloggerCourse.com  Fact Sheet for Healthcare Providers: SeriousBroker.it  This test is not yet approved or cleared by the [place]  FDA  and has been authorized for detection and/or diagnosis of SARS-CoV-2 by FDA under an Emergency Use Authorization (EUA). This EUA will remain in effect (meaning this test can be used) for the duration of the COVID-19 declaration under Section 564(b)(1) of the Act, 21 U.S.C. section 360bbb-3(b)(1), unless the authorization is terminated or revoked.     Resp Syncytial Virus by PCR NEGATIVE NEGATIVE Final    Comment: (NOTE) Fact Sheet for Patients: BloggerCourse.com  Fact Sheet for Healthcare Providers: SeriousBroker.it  This test is not yet approved or cleared by the [place]  FDA and  has been authorized for detection and/or diagnosis of SARS-CoV-2 by FDA under an Emergency Use Authorization (EUA). This EUA will remain in effect (meaning this test can be used) for the duration of the COVID-19 declaration under Section 564(b)(1) of the Act, 21 U.S.C. section 360bbb-3(b)(1), unless the authorization is terminated or revoked.  Performed at [facility], [address] [place], [place] [address]   Culture, blood (Routine X 2) w Reflex to ID Panel     Status: None (Preliminary result)   Collection Time: 04/14/24  9:26 PM   Specimen: BLOOD  Result Value Ref Range Status   Specimen Description BLOOD BLOOD RIGHT HAND  Final   Special Requests NONE  Final   Culture   Final    NO GROWTH 2 DAYS Performed at [facility], [address], [place], [place] [address]    Report Status PENDING  Incomplete  Culture, blood (Routine X 2) w Reflex to ID Panel     Status: None (Preliminary result)   Collection Time: 04/14/24  9:26 PM   Specimen: BLOOD  Result Value Ref Range Status   Specimen Description BLOOD BLOOD LEFT HAND  Final   Special Requests NONE  Final   Culture   Final    NO GROWTH 2 DAYS Performed at [facility] [address] [place], [place] [address]    Report Status PENDING  Incomplete  MRSA Next Gen by PCR, Nasal      Status: Abnormal   Collection Time: 04/14/24  9:39 PM   Specimen: Nasal Mucosa; Nasal Swab  Result Value Ref Range Status   MRSA by PCR Next Gen DETECTED (A) NOT DETECTED Final    Comment: RESULT CALLED TO, READ BACK BY AND VERIFIED WITH: R ESOCE,RN@0014  04/15/24 MK (NOTE) The GeneXpert MRSA Assay (FDA approved for NASAL specimens only), is one component of a comprehensive MRSA colonization surveillance program. It is not intended to diagnose MRSA infection nor to guide or monitor treatment for MRSA infections. Test performance is not FDA approved in patients less than 53 years old. Performed at [facility], [address], [place], [place] [address]      Scheduled Meds:  sodium chloride    Intravenous Once   sodium chloride    Intravenous Once   sodium chloride    Intravenous Once   sodium chloride    Intravenous Once   atorvastatin   40 mg Oral QHS   brimonidine   1 drop Both Eyes BID   Chlorhexidine  Gluconate Cloth  6 each Topical Q0600   Chlorhexidine  Gluconate Cloth  6 each Topical Daily   ferrous sulfate  325 mg Oral Q breakfast   fluticasone  furoate-vilanterol  1 puff Inhalation Daily   hydrocortisone  sod succinate (SOLU-CORTEF ) inj  40 mg Intravenous Q12H   latanoprost   1 drop Both Eyes QHS   mupirocin  ointment  1 Application Nasal BID   mycophenolate   1,000 mg Oral BID   omega-3 acid ethyl esters  1 g Oral BID   pantoprazole  40 mg Intravenous Q12H   sodium chloride  flush  10-40 mL Intracatheter Q12H   sucralfate  1 g Oral TID WC & HS   Continuous Infusions:  norepinephrine (LEVOPHED) Adult infusion 3 mcg/min (04/16/24 0516)    Procedures/Studies: DG Chest Portable 1 View Result Date: 04/15/2024 CLINICAL DATA:  PICC placement. EXAM: PORTABLE CHEST 1 VIEW COMPARISON:  April 14, 2024. FINDINGS: Stable cardiomediastinal silhouette. Right-sided PICC line is noted with distal tip in expected position of the SVC. Minimal bibasilar subsegmental atelectasis or scarring  is  noted with probable left basilar pleural thickening. New irregular nodular density seen in right upper lobe; CT scan of the chest is recommended for further evaluation. IMPRESSION: 1. Right-sided PICC line is noted with distal tip in expected position of the SVC. 2. New irregular nodular density seen in right upper lobe; CT scan of the chest is recommended for further evaluation. Electronically Signed   By: Lynwood Landy Raddle M.D.   On: 04/15/2024 17:10   US  EKG SITE RITE Result Date: 04/15/2024 If Site Rite image not attached, placement could not be confirmed due to current cardiac rhythm.  DG Chest Port 1 View Result Date: 04/14/2024 CLINICAL DATA:  Questionable sepsis. EXAM: PORTABLE CHEST 1 VIEW COMPARISON:  Chest radiograph dated 04/06/2024 FINDINGS: Shallow inspiration with bibasilar atelectasis. No consolidative changes. No pleural effusion pneumothorax. Stable cardiac silhouette. No acute osseous pathology. IMPRESSION: Shallow inspiration with bibasilar atelectasis. No focal consolidation. Electronically Signed   By: Vanetta Chou M.D.   On: 04/14/2024 17:30   CT ABDOMEN PELVIS WO CONTRAST Result Date: 04/14/2024 CLINICAL DATA:  Abdominal pain.  Hematemesis. EXAM: CT ABDOMEN AND PELVIS WITHOUT CONTRAST TECHNIQUE: Multidetector CT imaging of the abdomen and pelvis was performed following the standard protocol without IV contrast. RADIATION DOSE REDUCTION: This exam was performed according to the departmental dose-optimization program which includes automated exposure control, adjustment of the mA and/or kV according to patient size and/or use of iterative reconstruction technique. COMPARISON:  Abdominal ultrasound dated 11/04/2021. FINDINGS: Evaluation of this exam is limited in the absence of intravenous contrast. Lower chest: Bibasilar subpleural atelectasis/scarring. There is coronary vascular calcification. No intra-abdominal free air or free fluid. Hepatobiliary: The liver is unremarkable. No  biliary dilatation. The gallbladder is unremarkable. Pancreas: Unremarkable. No pancreatic ductal dilatation or surrounding inflammatory changes. Spleen: Normal in size without focal abnormality. Adrenals/Urinary Tract: The adrenal glands unremarkable. There is no hydronephrosis or nephrolithiasis on either side. Bilateral perinephric stranding, nonspecific. Correlation with urinalysis recommended to exclude UTI. The visualized ureters and urinary bladder appear unremarkable. Stomach/Bowel: There is loose stool throughout the colon suggestive of diarrheal state. Correlation with clinical exam and stool cultures recommended. There is no bowel obstruction or active inflammation. Appendectomy. Vascular/Lymphatic: Moderate aortoiliac atherosclerotic disease. The IVC is unremarkable. No portal venous gas. There is no adenopathy. Reproductive: The prostate and seminal vesicles are grossly unremarkable. Other: Small fat containing bilateral inguinal hernias. Anterior pelvic wall hernia repair mesh. Musculoskeletal: Osteopenia with degenerative changes of the spine. Status post ORIF of right femoral neck fracture. There is edema in the surrounding soft tissues of the right hip. No fluid collection. IMPRESSION: 1. Diarrheal state. Correlation with clinical exam and stool cultures recommended. No bowel obstruction. 2. No hydronephrosis or nephrolithiasis. 3.  Aortic Atherosclerosis (ICD10-I70.0). Electronically Signed   By: Vanetta Chou M.D.   On: 04/14/2024 17:29   DG HIP UNILAT WITH PELVIS 2-3 VIEWS RIGHT Result Date: 04/07/2024 CLINICAL DATA:  Postop internal fixation EXAM: DG HIP (WITH OR WITHOUT PELVIS) 2-3V RIGHT COMPARISON:  04/06/2024 FINDINGS: Changes of internal fixation across the right femoral intertrochanteric fracture. Anatomic alignment. No hardware complicating feature. IMPRESSION: Internal fixation.  No hardware complicating feature. Electronically Signed   By: Franky Crease M.D.   On: 04/07/2024 23:15    DG HIP UNILAT WITH PELVIS 2-3 VIEWS RIGHT Result Date: 04/07/2024 CLINICAL DATA:  Right hip internal fixation. EXAM: DG HIP (WITH OR WITHOUT PELVIS) 2-3V RIGHT COMPARISON:  04/06/2024 FINDINGS: Multiple intraoperative spot images demonstrate internal fixation across the right  femoral intertrochanteric fracture. No hardware complicating feature. IMPRESSION: Internal fixation with anatomic alignment.  No complicating feature. Electronically Signed   By: Franky Crease M.D.   On: 04/07/2024 23:07   DG C-Arm 1-60 Min-No Report Result Date: 04/07/2024 Fluoroscopy was utilized by the requesting physician.  No radiographic interpretation.   DG Chest Port 1 View Result Date: 04/06/2024 CLINICAL DATA:  Pre-surgical. EXAM: RIGHT KNEE - 1-2 VIEW; PORTABLE CHEST - 1 VIEW COMPARISON:  09/08/2023, 04/06/2024. FINDINGS: Right knee: No acute fracture or dislocation is seen. There is a bony exostosis along the lateral aspect of the mid femur mild to moderate tricompartmental degenerative changes are noted at the knee. There is a trace joint effusion. Vascular calcifications are present in the soft tissues. Chest: The heart is enlarged and the mediastinal contour is within normal limits for supine positioning. Atherosclerotic calcification of the aorta is noted. Lung volumes are low with atelectasis or infiltrate at the lung bases. There small bilateral pleural effusions. No pneumothorax is seen. No acute osseous abnormality. IMPRESSION: 1. No acute fracture or dislocation at the right knee. 2. Small bilateral pleural effusions with atelectasis or infiltrate at the lung bases. 3. Cardiomegaly. Electronically Signed   By: Leita Birmingham M.D.   On: 04/06/2024 17:29   DG Knee 1-2 Views Right Result Date: 04/06/2024 CLINICAL DATA:  Pre-surgical. EXAM: RIGHT KNEE - 1-2 VIEW; PORTABLE CHEST - 1 VIEW COMPARISON:  09/08/2023, 04/06/2024. FINDINGS: Right knee: No acute fracture or dislocation is seen. There is a bony exostosis  along the lateral aspect of the mid femur mild to moderate tricompartmental degenerative changes are noted at the knee. There is a trace joint effusion. Vascular calcifications are present in the soft tissues. Chest: The heart is enlarged and the mediastinal contour is within normal limits for supine positioning. Atherosclerotic calcification of the aorta is noted. Lung volumes are low with atelectasis or infiltrate at the lung bases. There small bilateral pleural effusions. No pneumothorax is seen. No acute osseous abnormality. IMPRESSION: 1. No acute fracture or dislocation at the right knee. 2. Small bilateral pleural effusions with atelectasis or infiltrate at the lung bases. 3. Cardiomegaly. Electronically Signed   By: Leita Birmingham M.D.   On: 04/06/2024 17:29   DG Hip Unilat W or Wo Pelvis 2-3 Views Right Result Date: 04/06/2024 CLINICAL DATA:  Fall with diffuse pain. EXAM: DG HIP (WITH OR WITHOUT PELVIS) 2-3V RIGHT COMPARISON:  None Available. FINDINGS: There is diffusely decreased mineralization of the bones. There is a nondisplaced intratrochanteric fracture of the proximal right femur. The remaining bony structures are intact and there is no dislocation. Degenerative changes are present at the bilateral hips, sacroiliac joints and lower lumbar spine. Vascular calcifications are seen in the soft tissues. IMPRESSION: Minimally displaced intertrochanteric fracture of the proximal right femur. Electronically Signed   By: Leita Birmingham M.D.   On: 04/06/2024 16:22   CT Head Wo Contrast Result Date: 04/06/2024 CLINICAL DATA:  Fall with head injury. EXAM: CT HEAD WITHOUT CONTRAST CT CERVICAL SPINE WITHOUT CONTRAST TECHNIQUE: Multidetector CT imaging of the head and cervical spine was performed following the standard protocol without intravenous contrast. Multiplanar CT image reconstructions of the cervical spine were also generated. RADIATION DOSE REDUCTION: This exam was performed according to the  departmental dose-optimization program which includes automated exposure control, adjustment of the mA and/or kV according to patient size and/or use of iterative reconstruction technique. COMPARISON:  CT cervical spine 07/21/2017. FINDINGS: CT HEAD FINDINGS Brain: Ventricles, cisterns and  other CSF spaces are normal. Minimal chronic ischemic microvascular disease. No mass, mass effect, shift of midline structures or acute hemorrhage. No evidence of acute infarction. Vascular: No hyperdense vessel or unexpected calcification. Skull: Normal. Negative for fracture or focal lesion. Sinuses/Orbits: Orbits are normal. Minimal opacification over the right sphenoid sinus compatible mild chronic inflammatory change. Other: None. CT CERVICAL SPINE FINDINGS Alignment: Subtle anterior stairstep subluxations over the mid to lower cervical spine unchanged. No posttraumatic subluxation. Skull base and vertebrae: Vertebral body heights are normal. There is mild to moderate spondylosis throughout the cervical spine to include uncovertebral joint spurring and facet arthropathy. Mild to moderate left-sided neural foraminal narrowing at the C2-3 level. Mild bilateral neural foraminal narrowing at the C3-4 level and moderate right-sided neural foraminal narrowing at the C4-5 level. Minimal left-sided neural foraminal narrowing at the C5-6 level. No acute fracture. Prevertebral soft tissues are normal. Soft tissues and spinal canal: No spinal canal abnormality. Prevertebral soft tissues normal. Disc levels: Disc space narrowing at the C5-6 and C6-7 levels. Also disc space narrowing at the C7-T1 level. Upper chest: No acute findings. Other: Oval mass over the posterior aspect of the right parotid gland measuring 1.2 cm in short axis with significant enlargement compared to the prior exam. IMPRESSION: 1. No acute brain injury. 2. Minimal chronic ischemic microvascular disease. 3. No acute cervical spine injury. 4. Mild to moderate  spondylosis throughout the cervical spine with disc disease at the C5-6, C6-7 and C7-T1 levels. Multilevel neural foraminal narrowing as described. 5. 1.2 cm oval mass over the posterior aspect of the right parotid gland with significant enlargement compared to the prior exam. This may represent an enlarged lymph node versus a primary parotid neoplasm. Consider MRI with without contrast for further evaluation. Electronically Signed   By: Toribio Agreste M.D.   On: 04/06/2024 16:15   CT Cervical Spine Wo Contrast Result Date: 04/06/2024 CLINICAL DATA:  Fall with head injury. EXAM: CT HEAD WITHOUT CONTRAST CT CERVICAL SPINE WITHOUT CONTRAST TECHNIQUE: Multidetector CT imaging of the head and cervical spine was performed following the standard protocol without intravenous contrast. Multiplanar CT image reconstructions of the cervical spine were also generated. RADIATION DOSE REDUCTION: This exam was performed according to the departmental dose-optimization program which includes automated exposure control, adjustment of the mA and/or kV according to patient size and/or use of iterative reconstruction technique. COMPARISON:  CT cervical spine 07/21/2017. FINDINGS: CT HEAD FINDINGS Brain: Ventricles, cisterns and other CSF spaces are normal. Minimal chronic ischemic microvascular disease. No mass, mass effect, shift of midline structures or acute hemorrhage. No evidence of acute infarction. Vascular: No hyperdense vessel or unexpected calcification. Skull: Normal. Negative for fracture or focal lesion. Sinuses/Orbits: Orbits are normal. Minimal opacification over the right sphenoid sinus compatible mild chronic inflammatory change. Other: None. CT CERVICAL SPINE FINDINGS Alignment: Subtle anterior stairstep subluxations over the mid to lower cervical spine unchanged. No posttraumatic subluxation. Skull base and vertebrae: Vertebral body heights are normal. There is mild to moderate spondylosis throughout the cervical  spine to include uncovertebral joint spurring and facet arthropathy. Mild to moderate left-sided neural foraminal narrowing at the C2-3 level. Mild bilateral neural foraminal narrowing at the C3-4 level and moderate right-sided neural foraminal narrowing at the C4-5 level. Minimal left-sided neural foraminal narrowing at the C5-6 level. No acute fracture. Prevertebral soft tissues are normal. Soft tissues and spinal canal: No spinal canal abnormality. Prevertebral soft tissues normal. Disc levels: Disc space narrowing at the C5-6 and C6-7 levels. Also  disc space narrowing at the C7-T1 level. Upper chest: No acute findings. Other: Oval mass over the posterior aspect of the right parotid gland measuring 1.2 cm in short axis with significant enlargement compared to the prior exam. IMPRESSION: 1. No acute brain injury. 2. Minimal chronic ischemic microvascular disease. 3. No acute cervical spine injury. 4. Mild to moderate spondylosis throughout the cervical spine with disc disease at the C5-6, C6-7 and C7-T1 levels. Multilevel neural foraminal narrowing as described. 5. 1.2 cm oval mass over the posterior aspect of the right parotid gland with significant enlargement compared to the prior exam. This may represent an enlarged lymph node versus a primary parotid neoplasm. Consider MRI with without contrast for further evaluation. Electronically Signed   By: Toribio Agreste M.D.   On: 04/06/2024 16:15    Alm Schneider, DO  [facility]  If 7PM-7AM, please contact night-coverage [email or web address] Password [email or web address] 04/16/2024, 10:30 AM   LOS: 2 days

## 2024-04-17 ENCOUNTER — Ambulatory Visit (INDEPENDENT_AMBULATORY_CARE_PROVIDER_SITE_OTHER): Payer: Self-pay | Admitting: Gastroenterology

## 2024-04-17 ENCOUNTER — Inpatient Hospital Stay (HOSPITAL_COMMUNITY)

## 2024-04-17 DIAGNOSIS — D649 Anemia, unspecified: Secondary | ICD-10-CM | POA: Diagnosis not present

## 2024-04-17 DIAGNOSIS — D72829 Elevated white blood cell count, unspecified: Secondary | ICD-10-CM

## 2024-04-17 DIAGNOSIS — N1831 Chronic kidney disease, stage 3a: Secondary | ICD-10-CM

## 2024-04-17 DIAGNOSIS — N179 Acute kidney failure, unspecified: Secondary | ICD-10-CM

## 2024-04-17 DIAGNOSIS — N178 Other acute kidney failure: Secondary | ICD-10-CM | POA: Diagnosis not present

## 2024-04-17 DIAGNOSIS — K922 Gastrointestinal hemorrhage, unspecified: Secondary | ICD-10-CM | POA: Diagnosis not present

## 2024-04-17 DIAGNOSIS — R578 Other shock: Secondary | ICD-10-CM | POA: Diagnosis not present

## 2024-04-17 LAB — CBC WITH DIFFERENTIAL/PLATELET
Abs Immature Granulocytes: 1.45 K/uL — ABNORMAL HIGH (ref 0.00–0.07)
Basophils Absolute: 0.1 K/uL (ref 0.0–0.1)
Basophils Relative: 1 %
Eosinophils Absolute: 0 K/uL (ref 0.0–0.5)
Eosinophils Relative: 0 %
HCT: 23.6 % — ABNORMAL LOW (ref 39.0–52.0)
Hemoglobin: 7.5 g/dL — ABNORMAL LOW (ref 13.0–17.0)
Immature Granulocytes: 10 %
Lymphocytes Relative: 7 %
Lymphs Abs: 1 K/uL (ref 0.7–4.0)
MCH: 31.6 pg (ref 26.0–34.0)
MCHC: 31.8 g/dL (ref 30.0–36.0)
MCV: 99.6 fL (ref 80.0–100.0)
Monocytes Absolute: 0.9 K/uL (ref 0.1–1.0)
Monocytes Relative: 6 %
Neutro Abs: 10.4 K/uL — ABNORMAL HIGH (ref 1.7–7.7)
Neutrophils Relative %: 76 %
Platelets: 192 K/uL (ref 150–400)
RBC: 2.37 MIL/uL — ABNORMAL LOW (ref 4.22–5.81)
RDW: 20.2 % — ABNORMAL HIGH (ref 11.5–15.5)
Smear Review: NORMAL
WBC: 13.9 K/uL — ABNORMAL HIGH (ref 4.0–10.5)
nRBC: 1.6 % — ABNORMAL HIGH (ref 0.0–0.2)

## 2024-04-17 LAB — BASIC METABOLIC PANEL WITH GFR
Anion gap: 9 (ref 5–15)
BUN: 59 mg/dL — ABNORMAL HIGH (ref 8–23)
CO2: 23 mmol/L (ref 22–32)
Calcium: 7.7 mg/dL — ABNORMAL LOW (ref 8.9–10.3)
Chloride: 108 mmol/L (ref 98–111)
Creatinine, Ser: 1.29 mg/dL — ABNORMAL HIGH (ref 0.61–1.24)
GFR, Estimated: 54 mL/min — ABNORMAL LOW (ref >60)
Glucose, Bld: 173 mg/dL — ABNORMAL HIGH (ref 70–99)
Potassium: 3.9 mmol/L (ref 3.5–5.1)
Sodium: 140 mmol/L (ref 135–145)

## 2024-04-17 LAB — MAGNESIUM: Magnesium: 2.6 mg/dL — ABNORMAL HIGH (ref 1.7–2.4)

## 2024-04-17 LAB — GLUCOSE, CAPILLARY: Glucose-Capillary: 145 mg/dL — ABNORMAL HIGH (ref 70–99)

## 2024-04-17 LAB — PREPARE RBC (CROSSMATCH)

## 2024-04-17 LAB — HEMOGLOBIN AND HEMATOCRIT, BLOOD
HCT: 24.9 % — ABNORMAL LOW (ref 39.0–52.0)
Hemoglobin: 8.1 g/dL — ABNORMAL LOW (ref 13.0–17.0)

## 2024-04-17 LAB — SURGICAL PATHOLOGY

## 2024-04-17 MED ORDER — SODIUM CHLORIDE 0.9% IV SOLUTION: Freq: Once | INTRAVENOUS | Status: AC

## 2024-04-17 MED ORDER — SODIUM CHLORIDE 0.9 % IV SOLN
2.0000 g | Freq: Two times a day (BID) | INTRAVENOUS | Status: DC
  Administered 2024-04-17 – 2024-04-20 (×8): 2 g via INTRAVENOUS
  Filled 2024-04-17 (×9): qty 12.5

## 2024-04-17 MED ORDER — SODIUM CHLORIDE 0.9 % IV SOLN
100.0000 mg | Freq: Two times a day (BID) | INTRAVENOUS | Status: DC
  Administered 2024-04-17 – 2024-04-20 (×8): 100 mg via INTRAVENOUS
  Filled 2024-04-17 (×11): qty 100

## 2024-04-17 MED ORDER — IOHEXOL 350 MG/ML SOLN
80.0000 mL | Freq: Once | INTRAVENOUS | Status: AC | PRN
  Administered 2024-04-17: 80 mL via INTRAVENOUS

## 2024-04-17 NOTE — Progress Notes (Signed)
 PROGRESS NOTE  Jeffrey Stewart FMW:[id number] DOB: 09-Aug-1934 DOA: 04/14/2024 PCP: Anita Bernardino [name], FNP  Brief History:  89 y.o. male with medical history significant for dCHF, interstitial lung disease, RBBB, rheumatoid arthritis, CAD. Patient was brought to the ED via EMS with reports of vomiting of blood, and blood in his stools that started this morning.  Spouse is at bedside and confirms that patients vomitus contained golf sized blood clots.  He is not aware of having any black stools.  Reports some lower abdominal pain earlier today that has since resolved.  He has history of hemorrhoids which occasionally bleed, but not to this extent, he has never vomited blood.  He denies NSAID use.  EMS reports blood pressure transiently down to 80s and route.   Recently hospitalized 9/29 to 9/27 for hip fracture-underwent ORIF 03/2021.  Also with acute blood loss anemia postprocedure, requiring 1 unit PRBC.  Hemoglobin on discharge was 7.1.  Refused SNF and was discharged to home.  He is not on full dose anticoagulation.   ED Course: Temperature 98.2.  Tachycardic heart rate 94-116.  Respiratory rate 18- 35.  Blood pressure systolic 94-115.  O2 sat greater than 92% on room air. Hemoglobin 6.3.  WBC 28.2.  Lactic acid 3.9.  CTAP W OC-diarrheal state, correlation with clinical exam and stool cultures recommended.  EDP talked to Dr. Cindie, n.p.o., IV PPI twice daily, plan for EGD 9/30.   Assessment/Plan: Hemorrhagic Shock Acute blood loss anemia Multiple gastric ulcers  -- pt had blood BMs/hematemesis, transferred to ICU started on pressor support for hypotension -- pt had another blood BM 04/17/24 AM -- 4 units PRBC transfused this admisson --transfuse one more PRBC 10/2 -- he remains on levophed 2-3 mcg/kg/min -- 9/30 EGD--3x nonbleeding cratered gastric ulcers injected with epi and bipolar cautery -- continue pantoprazole 40 mg IV BID -- sucralfate suspension QID for 8 weeks -- avoid all  NSAIDS--pt was on ASA 81 mg bid PTA -- Stat CT GI bleed -- wean IV norepinephrine as able    Acute on CKD stage 3a Lactic acidosis Hyperkalemia -- treated with IV dextrose  and insulin  -- down to 5.2 -- given lokelma 10 g 2 doses -- baseline creatinine 0.9-1.2 -- initially fluid resuscitated -- renal US --negative hydronephrosis -- treating hypotension with IV norepinephrine to improve renal perfusion -- follow BMP  -- lactic acidosis trending down with IV fluid and improving low blood pressure   Leukemoid reaction -- multifactorial including steroids, stress demargination, transfusions --9/29 blood culture neg -- 9/29 UA--no pyuria   New irregular nodular density seen in right upper lobe  -- 10/1 CT chest--irregular 1.0x 2.2 cm RUL opacity, 5x68mm LLL nodule -- PCT 0.79 -- treat empirically with abx -- outpt follow up with pulmonary and surveillance CT -- pt and family informed of plan    Glaucoma -- resumed home eye drops   Chronic HFpEF -2/923 Echo EF 50%, G1DD, mod decrease RVF -holding torsemide  temporarily   Right hip fracture- 9/29 to 9/27 for hip fracture-underwent ORIF 03/2021. Right thigh has extensive ecchymosis, likely contributed to the anemia. - Hold aspirin  and all anticoagulation    Stage I decubitus ulcer -- skin care protocol   Rheumatoid arthritis/interstitial lung disease- On chronic prednisone  5 mg a.m. and 1 mg p.m. and mycophenolate . - Resume mycophenolate  - remains on stress dose steroids      Family Communication:   spouse/DTR at bedside 10/2   Consultants:  GI   Code Status:  FULL   DVT Prophylaxis:  SCDs     Procedures: As Listed in Progress Note Above   Antibiotics: Cefepime 10/2>> Doxy 10/2>>         The patient is critically ill with multiple organ systems failure and requires high complexity decision making for assessment and support, frequent evaluation and titration of therapies, application of advanced monitoring  technologies and extensive interpretation of multiple databases.  Critical care time - 35 mins.      Subjective: Pt had a blood BM this morning.  Denies f/c, cp, sob, n/v/d, abd pain  Objective: Vitals:   04/17/24 0645 04/17/24 0700 04/17/24 0715 04/17/24 0744  BP: (!) 120/53 (!) 111/47 120/84   Pulse: (!) 104 (!) 101 (!) 105   Resp: (!) 21 19 (!) 27   Temp:    98.2 F (36.8 C)  TempSrc:    Oral  SpO2: 98% 95% 100%   Weight:      Height:        Intake/Output Summary (Last 24 hours) at 04/17/2024 0854 Last data filed at 04/17/2024 0700 Gross per 24 hour  Intake 411.91 ml  Output 1450 ml  Net -1038.09 ml   Weight change:  Exam:  General:  Pt is alert, follows commands appropriately, not in acute distress HEENT: No icterus, No thrush, No neck mass, [place]/AT Cardiovascular: RRR, S1/S2, no rubs, no gallops Respiratory:scattered bilateral rales Abdomen: Soft/+BS, non tender, non distended, no guarding Extremities: 1 + LE edema, No lymphangitis, No petechiae, No rashes, no synovitis   Data Reviewed: I have personally reviewed following labs and imaging studies Basic Metabolic Panel: Recent Labs  Lab 04/14/24 1422 04/15/24 0339 04/15/24 1154 04/16/24 0506 04/17/24 0435  NA 136 138 140 139 140  K 4.8 5.7* 5.2* 4.4 3.9  CL 101 104 107 108 108  CO2 22 20* 21* 20* 23  GLUCOSE 166* 212* 175* 162* 173*  BUN 72* 87* 87* 81* 59*  CREATININE 1.62* 2.16* 2.06* 1.80* 1.29*  CALCIUM  7.8* 7.4* 7.6* 7.4* 7.7*  MG  --  2.4  --   --  2.6*  PHOS  --  4.7*  --   --   --    Liver Function Tests: Recent Labs  Lab 04/14/24 1422  AST 26  ALT 13  ALKPHOS 42  BILITOT 1.7*  PROT 4.7*  ALBUMIN  2.6*   Recent Labs  Lab 04/14/24 1422  LIPASE 30   No results for input(s): AMMONIA in the last 168 hours. Coagulation Profile: Recent Labs  Lab 04/14/24 1422  INR 1.1   CBC: Recent Labs  Lab 04/14/24 1422 04/14/24 2040 04/15/24 0339 04/15/24 1154 04/16/24 0506  04/16/24 1214 04/17/24 0435  WBC 28.2*  --  30.0* 25.1* 17.7*  --  13.9*  NEUTROABS 25.9*  --   --  19.1* 13.6*  --  10.4*  HGB 6.3*   < > 6.4* 9.5* 7.8* 7.7* 7.5*  HCT 19.7*   < > 20.1* 28.5* 24.6* 24.4* 23.6*  MCV 113.2*  --  105.2* 95.6 98.4  --  99.6  PLT 274  --  291 239 188  --  192   < > = values in this interval not displayed.   Cardiac Enzymes: No results for input(s): CKTOTAL, CKMB, CKMBINDEX, TROPONINI in the last 168 hours. BNP: Invalid input(s): POCBNP CBG: Recent Labs  Lab 04/14/24 2210 04/15/24 0541 04/15/24 1342  GLUCAP 175* 221* 154*   HbA1C: No results for input(s):  HGBA1C in the last 72 hours. Urine analysis:    Component Value Date/Time   COLORURINE YELLOW 04/14/2024 2215   APPEARANCEUR HAZY (A) 04/14/2024 2215   LABSPEC 1.020 04/14/2024 2215   PHURINE 5.0 04/14/2024 2215   GLUCOSEU NEGATIVE 04/14/2024 2215   HGBUR NEGATIVE 04/14/2024 2215   BILIRUBINUR NEGATIVE 04/14/2024 2215   KETONESUR NEGATIVE 04/14/2024 2215   PROTEINUR NEGATIVE 04/14/2024 2215   UROBILINOGEN 1.0 09/24/2013 0905   NITRITE NEGATIVE 04/14/2024 2215   LEUKOCYTESUR NEGATIVE 04/14/2024 2215   Sepsis Labs: @LABRCNTIP (procalcitonin:4,lacticidven:4) ) Recent Results (from the past 240 hours)  Resp panel by RT-PCR (RSV, Flu A&B, Covid) Anterior Nasal Swab     Status: None   Collection Time: 04/14/24  2:03 PM   Specimen: Anterior Nasal Swab  Result Value Ref Range Status   SARS Coronavirus 2 by RT PCR NEGATIVE NEGATIVE Final    Comment: (NOTE) SARS-CoV-2 target nucleic acids are NOT DETECTED.  The SARS-CoV-2 RNA is generally detectable in upper respiratory specimens during the acute phase of infection. The lowest concentration of SARS-CoV-2 viral copies this assay can detect is 138 copies/mL. A negative result does not preclude SARS-Cov-2 infection and should not be used as the sole basis for treatment or other patient management decisions. A negative result may  occur with  improper specimen collection/handling, submission of specimen other than nasopharyngeal swab, presence of viral mutation(s) within the areas targeted by this assay, and inadequate number of viral copies(<138 copies/mL). A negative result must be combined with clinical observations, patient history, and epidemiological information. The expected result is Negative.  Fact Sheet for Patients:  BloggerCourse.com  Fact Sheet for Healthcare Providers:  SeriousBroker.it  This test is no t yet approved or cleared by the [place]  FDA and  has been authorized for detection and/or diagnosis of SARS-CoV-2 by FDA under an Emergency Use Authorization (EUA). This EUA will remain  in effect (meaning this test can be used) for the duration of the COVID-19 declaration under Section 564(b)(1) of the Act, 21 U.S.C.section 360bbb-3(b)(1), unless the authorization is terminated  or revoked sooner.       Influenza A by PCR NEGATIVE NEGATIVE Final   Influenza B by PCR NEGATIVE NEGATIVE Final    Comment: (NOTE) The Xpert Xpress SARS-CoV-2/FLU/RSV plus assay is intended as an aid in the diagnosis of influenza from Nasopharyngeal swab specimens and should not be used as a sole basis for treatment. Nasal washings and aspirates are unacceptable for Xpert Xpress SARS-CoV-2/FLU/RSV testing.  Fact Sheet for Patients: BloggerCourse.com  Fact Sheet for Healthcare Providers: SeriousBroker.it  This test is not yet approved or cleared by the [place]  FDA and has been authorized for detection and/or diagnosis of SARS-CoV-2 by FDA under an Emergency Use Authorization (EUA). This EUA will remain in effect (meaning this test can be used) for the duration of the COVID-19 declaration under Section 564(b)(1) of the Act, 21 U.S.C. section 360bbb-3(b)(1), unless the authorization is terminated  or revoked.     Resp Syncytial Virus by PCR NEGATIVE NEGATIVE Final    Comment: (NOTE) Fact Sheet for Patients: BloggerCourse.com  Fact Sheet for Healthcare Providers: SeriousBroker.it  This test is not yet approved or cleared by the [place]  FDA and has been authorized for detection and/or diagnosis of SARS-CoV-2 by FDA under an Emergency Use Authorization (EUA). This EUA will remain in effect (meaning this test can be used) for the duration of the COVID-19 declaration under Section 564(b)(1) of the Act, 21 U.S.C. section 360bbb-3(b)(1), unless  the authorization is terminated or revoked.  Performed at [facility], [address], [place], [place] [address]   Culture, blood (Routine X 2) w Reflex to ID Panel     Status: None (Preliminary result)   Collection Time: 04/14/24  9:26 PM   Specimen: BLOOD  Result Value Ref Range Status   Specimen Description BLOOD BLOOD RIGHT HAND  Final   Special Requests NONE  Final   Culture   Final    NO GROWTH 3 DAYS Performed at [facility], [address], [place], [place] [address]    Report Status PENDING  Incomplete  Culture, blood (Routine X 2) w Reflex to ID Panel     Status: None (Preliminary result)   Collection Time: 04/14/24  9:26 PM   Specimen: BLOOD  Result Value Ref Range Status   Specimen Description BLOOD BLOOD LEFT HAND  Final   Special Requests NONE  Final   Culture   Final    NO GROWTH 3 DAYS Performed at [facility], [address] [place], [place] [address]    Report Status PENDING  Incomplete  MRSA Next Gen by PCR, Nasal     Status: Abnormal   Collection Time: 04/14/24  9:39 PM   Specimen: Nasal Mucosa; Nasal Swab  Result Value Ref Range Status   MRSA by PCR Next Gen DETECTED (A) NOT DETECTED Final    Comment: RESULT CALLED TO, READ BACK BY AND VERIFIED WITH: R ESOCE,RN@0014  04/15/24 MK (NOTE) The GeneXpert MRSA Assay (FDA approved for NASAL specimens  only), is one component of a comprehensive MRSA colonization surveillance program. It is not intended to diagnose MRSA infection nor to guide or monitor treatment for MRSA infections. Test performance is not FDA approved in patients less than 53 years old. Performed at [facility], [address], [place], [place] [address]      Scheduled Meds:  sodium chloride    Intravenous Once   sodium chloride    Intravenous Once   sodium chloride    Intravenous Once   sodium chloride    Intravenous Once   sodium chloride    Intravenous Once   atorvastatin   40 mg Oral QHS   brimonidine   1 drop Both Eyes BID   Chlorhexidine  Gluconate Cloth  6 each Topical Q0600   Chlorhexidine  Gluconate Cloth  6 each Topical Daily   ferrous sulfate  325 mg Oral Q breakfast   fluticasone  furoate-vilanterol  1 puff Inhalation Daily   hydrocortisone  sod succinate (SOLU-CORTEF ) inj  40 mg Intravenous Q12H   latanoprost   1 drop Both Eyes QHS   mupirocin  ointment  1 Application Nasal BID   mycophenolate   1,000 mg Oral BID   omega-3 acid ethyl esters  1 g Oral BID   pantoprazole  40 mg Intravenous Q12H   sodium chloride  flush  10-40 mL Intracatheter Q12H   sucralfate  1 g Oral TID WC & HS   Continuous Infusions:  norepinephrine (LEVOPHED) Adult infusion 2 mcg/min (04/17/24 0601)    Procedures/Studies: CT CHEST WO CONTRAST Result Date: 04/16/2024 CLINICAL DATA:  Respiratory illness, nondiagnostic xray RUL opacity on CXR. EXAM: CT CHEST WITHOUT CONTRAST TECHNIQUE: Multidetector CT imaging of the chest was performed following the standard protocol without IV contrast. RADIATION DOSE REDUCTION: This exam was performed according to the departmental dose-optimization program which includes automated exposure control, adjustment of the mA and/or kV according to patient size and/or use of iterative reconstruction technique. COMPARISON:  Chest radiograph from 04/15/2024. FINDINGS: Cardiovascular: Normal cardiac size. No  pericardial effusion. No aortic aneurysm. There are coronary artery calcifications, in keeping with coronary artery disease. There are also moderate peripheral atherosclerotic vascular calcifications of thoracic aorta and its major branches. Aberrant origin of right subclavian artery noted, which arises distal to the left subclavian artery and courses towards the right side, posterior to the esophagus. Mediastinum/Nodes: Visualized thyroid gland appears grossly unremarkable. No solid / cystic mediastinal masses. The esophagus is nondistended precluding optimal assessment. There are few mildly prominent mediastinal lymph nodes, which do not meet the size criteria for lymphadenopathy and appear grossly similar to the prior study, favoring benign etiology. No axillary lymphadenopathy by size criteria. Evaluation of bilateral hila is limited due to lack on intravenous contrast: however, no large hilar lymphadenopathy identified. Lungs/Pleura: The central tracheo-bronchial tree is patent. There is a new irregular solid noncalcified 1.0 x 2.2 cm opacity in the subpleural right upper lobe. There are several additional linear areas of scarring/atelectasis throughout bilateral lungs, which are grossly similar to the prior study. No lung collapse, pleural effusion or pneumothorax. No pulmonary edema. There is a 5 x 5 mm ground-glass nodule in the left lung lower lobe, which is also new since the prior study. Upper Abdomen: There is small sliding hiatal hernia. Visualized upper abdominal viscera within normal limits. Musculoskeletal: Right-sided PICC line noted with its tip in the cavo-atrial junction region. Visualized soft tissues of the chest wall are otherwise grossly unremarkable. No suspicious osseous lesions. There are mild multilevel degenerative changes in the visualized spine. IMPRESSION: 1. There is a new irregular solid noncalcified 1.0 x 2.2 cm opacity in the subpleural right upper lobe. This opacity corresponds  to the density described on the recent chest radiograph. This is suspicious. Pulmonary consultation is recommended. 2. There is also a new 5 x 5 mm ground-glass nodule in the left lung lower lobe. This is also indeterminate and differential diagnosis includes infectious/inflammatory etiology versus neoplastic process. 3. Multiple other nonacute observations, as described above. Aortic Atherosclerosis (ICD10-I70.0). Electronically Signed   By: Ree Molt M.D.   On: 04/16/2024 16:44   US  RENAL Result Date: 04/16/2024 CLINICAL DATA:  Acute kidney injury. EXAM: RENAL / URINARY TRACT ULTRASOUND COMPLETE COMPARISON:  None Available. FINDINGS: Right Kidney: Renal measurements: 10.9 x 5.2 x 5.3 cm = volume: 156 mL. Increased cortical echogenicity. Mild cortical atrophy. No hydronephrosis. Left Kidney: Renal measurements: 13.3 x 5.9 x 5.5 cm = volume: 225 mL. Mildly increased cortical echogenicity. No hydronephrosis. Bladder: Appears normal for degree of bladder distention. Other: None. IMPRESSION: Increased cortical echogenicity of both kidneys consistent with chronic kidney disease. No evidence of hydronephrosis. Electronically Signed   By: Marcey Moan M.D.   On: 04/16/2024 14:09   DG Chest Portable 1 View Result Date: 04/15/2024 CLINICAL DATA:  PICC placement. EXAM: PORTABLE CHEST 1 VIEW COMPARISON:  April 14, 2024. FINDINGS: Stable cardiomediastinal silhouette. Right-sided PICC line is noted with distal tip in expected position of the SVC. Minimal bibasilar subsegmental atelectasis or scarring is noted with probable left basilar pleural thickening. New irregular nodular density seen in right upper lobe; CT scan of the chest is recommended for further evaluation. IMPRESSION: 1. Right-sided PICC line is noted with distal tip in expected position of the SVC. 2. New irregular nodular density seen in right upper lobe; CT scan of the chest is recommended for further evaluation. Electronically Signed   By:  Lynwood Landy Raddle M.D.   On: 04/15/2024 17:10   US  EKG SITE RITE Result Date: 04/15/2024 If Site  Rite image not attached, placement could not be confirmed due to current cardiac rhythm.  DG Chest Port 1 View Result Date: 04/14/2024 CLINICAL DATA:  Questionable sepsis. EXAM: PORTABLE CHEST 1 VIEW COMPARISON:  Chest radiograph dated 04/06/2024 FINDINGS: Shallow inspiration with bibasilar atelectasis. No consolidative changes. No pleural effusion pneumothorax. Stable cardiac silhouette. No acute osseous pathology. IMPRESSION: Shallow inspiration with bibasilar atelectasis. No focal consolidation. Electronically Signed   By: Vanetta Chou M.D.   On: 04/14/2024 17:30   CT ABDOMEN PELVIS WO CONTRAST Result Date: 04/14/2024 CLINICAL DATA:  Abdominal pain.  Hematemesis. EXAM: CT ABDOMEN AND PELVIS WITHOUT CONTRAST TECHNIQUE: Multidetector CT imaging of the abdomen and pelvis was performed following the standard protocol without IV contrast. RADIATION DOSE REDUCTION: This exam was performed according to the departmental dose-optimization program which includes automated exposure control, adjustment of the mA and/or kV according to patient size and/or use of iterative reconstruction technique. COMPARISON:  Abdominal ultrasound dated 11/04/2021. FINDINGS: Evaluation of this exam is limited in the absence of intravenous contrast. Lower chest: Bibasilar subpleural atelectasis/scarring. There is coronary vascular calcification. No intra-abdominal free air or free fluid. Hepatobiliary: The liver is unremarkable. No biliary dilatation. The gallbladder is unremarkable. Pancreas: Unremarkable. No pancreatic ductal dilatation or surrounding inflammatory changes. Spleen: Normal in size without focal abnormality. Adrenals/Urinary Tract: The adrenal glands unremarkable. There is no hydronephrosis or nephrolithiasis on either side. Bilateral perinephric stranding, nonspecific. Correlation with urinalysis recommended to exclude  UTI. The visualized ureters and urinary bladder appear unremarkable. Stomach/Bowel: There is loose stool throughout the colon suggestive of diarrheal state. Correlation with clinical exam and stool cultures recommended. There is no bowel obstruction or active inflammation. Appendectomy. Vascular/Lymphatic: Moderate aortoiliac atherosclerotic disease. The IVC is unremarkable. No portal venous gas. There is no adenopathy. Reproductive: The prostate and seminal vesicles are grossly unremarkable. Other: Small fat containing bilateral inguinal hernias. Anterior pelvic wall hernia repair mesh. Musculoskeletal: Osteopenia with degenerative changes of the spine. Status post ORIF of right femoral neck fracture. There is edema in the surrounding soft tissues of the right hip. No fluid collection. IMPRESSION: 1. Diarrheal state. Correlation with clinical exam and stool cultures recommended. No bowel obstruction. 2. No hydronephrosis or nephrolithiasis. 3.  Aortic Atherosclerosis (ICD10-I70.0). Electronically Signed   By: Vanetta Chou M.D.   On: 04/14/2024 17:29   DG HIP UNILAT WITH PELVIS 2-3 VIEWS RIGHT Result Date: 04/07/2024 CLINICAL DATA:  Postop internal fixation EXAM: DG HIP (WITH OR WITHOUT PELVIS) 2-3V RIGHT COMPARISON:  04/06/2024 FINDINGS: Changes of internal fixation across the right femoral intertrochanteric fracture. Anatomic alignment. No hardware complicating feature. IMPRESSION: Internal fixation.  No hardware complicating feature. Electronically Signed   By: Franky Crease M.D.   On: 04/07/2024 23:15   DG HIP UNILAT WITH PELVIS 2-3 VIEWS RIGHT Result Date: 04/07/2024 CLINICAL DATA:  Right hip internal fixation. EXAM: DG HIP (WITH OR WITHOUT PELVIS) 2-3V RIGHT COMPARISON:  04/06/2024 FINDINGS: Multiple intraoperative spot images demonstrate internal fixation across the right femoral intertrochanteric fracture. No hardware complicating feature. IMPRESSION: Internal fixation with anatomic alignment.  No  complicating feature. Electronically Signed   By: Franky Crease M.D.   On: 04/07/2024 23:07   DG C-Arm 1-60 Min-No Report Result Date: 04/07/2024 Fluoroscopy was utilized by the requesting physician.  No radiographic interpretation.   DG Chest Port 1 View Result Date: 04/06/2024 CLINICAL DATA:  Pre-surgical. EXAM: RIGHT KNEE - 1-2 VIEW; PORTABLE CHEST - 1 VIEW COMPARISON:  09/08/2023, 04/06/2024. FINDINGS: Right knee: No acute fracture or dislocation is  seen. There is a bony exostosis along the lateral aspect of the mid femur mild to moderate tricompartmental degenerative changes are noted at the knee. There is a trace joint effusion. Vascular calcifications are present in the soft tissues. Chest: The heart is enlarged and the mediastinal contour is within normal limits for supine positioning. Atherosclerotic calcification of the aorta is noted. Lung volumes are low with atelectasis or infiltrate at the lung bases. There small bilateral pleural effusions. No pneumothorax is seen. No acute osseous abnormality. IMPRESSION: 1. No acute fracture or dislocation at the right knee. 2. Small bilateral pleural effusions with atelectasis or infiltrate at the lung bases. 3. Cardiomegaly. Electronically Signed   By: Leita Birmingham M.D.   On: 04/06/2024 17:29   DG Knee 1-2 Views Right Result Date: 04/06/2024 CLINICAL DATA:  Pre-surgical. EXAM: RIGHT KNEE - 1-2 VIEW; PORTABLE CHEST - 1 VIEW COMPARISON:  09/08/2023, 04/06/2024. FINDINGS: Right knee: No acute fracture or dislocation is seen. There is a bony exostosis along the lateral aspect of the mid femur mild to moderate tricompartmental degenerative changes are noted at the knee. There is a trace joint effusion. Vascular calcifications are present in the soft tissues. Chest: The heart is enlarged and the mediastinal contour is within normal limits for supine positioning. Atherosclerotic calcification of the aorta is noted. Lung volumes are low with atelectasis or  infiltrate at the lung bases. There small bilateral pleural effusions. No pneumothorax is seen. No acute osseous abnormality. IMPRESSION: 1. No acute fracture or dislocation at the right knee. 2. Small bilateral pleural effusions with atelectasis or infiltrate at the lung bases. 3. Cardiomegaly. Electronically Signed   By: Leita Birmingham M.D.   On: 04/06/2024 17:29   DG Hip Unilat W or Wo Pelvis 2-3 Views Right Result Date: 04/06/2024 CLINICAL DATA:  Fall with diffuse pain. EXAM: DG HIP (WITH OR WITHOUT PELVIS) 2-3V RIGHT COMPARISON:  None Available. FINDINGS: There is diffusely decreased mineralization of the bones. There is a nondisplaced intratrochanteric fracture of the proximal right femur. The remaining bony structures are intact and there is no dislocation. Degenerative changes are present at the bilateral hips, sacroiliac joints and lower lumbar spine. Vascular calcifications are seen in the soft tissues. IMPRESSION: Minimally displaced intertrochanteric fracture of the proximal right femur. Electronically Signed   By: Leita Birmingham M.D.   On: 04/06/2024 16:22   CT Head Wo Contrast Result Date: 04/06/2024 CLINICAL DATA:  Fall with head injury. EXAM: CT HEAD WITHOUT CONTRAST CT CERVICAL SPINE WITHOUT CONTRAST TECHNIQUE: Multidetector CT imaging of the head and cervical spine was performed following the standard protocol without intravenous contrast. Multiplanar CT image reconstructions of the cervical spine were also generated. RADIATION DOSE REDUCTION: This exam was performed according to the departmental dose-optimization program which includes automated exposure control, adjustment of the mA and/or kV according to patient size and/or use of iterative reconstruction technique. COMPARISON:  CT cervical spine 07/21/2017. FINDINGS: CT HEAD FINDINGS Brain: Ventricles, cisterns and other CSF spaces are normal. Minimal chronic ischemic microvascular disease. No mass, mass effect, shift of midline structures  or acute hemorrhage. No evidence of acute infarction. Vascular: No hyperdense vessel or unexpected calcification. Skull: Normal. Negative for fracture or focal lesion. Sinuses/Orbits: Orbits are normal. Minimal opacification over the right sphenoid sinus compatible mild chronic inflammatory change. Other: None. CT CERVICAL SPINE FINDINGS Alignment: Subtle anterior stairstep subluxations over the mid to lower cervical spine unchanged. No posttraumatic subluxation. Skull base and vertebrae: Vertebral body heights are normal. There is  mild to moderate spondylosis throughout the cervical spine to include uncovertebral joint spurring and facet arthropathy. Mild to moderate left-sided neural foraminal narrowing at the C2-3 level. Mild bilateral neural foraminal narrowing at the C3-4 level and moderate right-sided neural foraminal narrowing at the C4-5 level. Minimal left-sided neural foraminal narrowing at the C5-6 level. No acute fracture. Prevertebral soft tissues are normal. Soft tissues and spinal canal: No spinal canal abnormality. Prevertebral soft tissues normal. Disc levels: Disc space narrowing at the C5-6 and C6-7 levels. Also disc space narrowing at the C7-T1 level. Upper chest: No acute findings. Other: Oval mass over the posterior aspect of the right parotid gland measuring 1.2 cm in short axis with significant enlargement compared to the prior exam. IMPRESSION: 1. No acute brain injury. 2. Minimal chronic ischemic microvascular disease. 3. No acute cervical spine injury. 4. Mild to moderate spondylosis throughout the cervical spine with disc disease at the C5-6, C6-7 and C7-T1 levels. Multilevel neural foraminal narrowing as described. 5. 1.2 cm oval mass over the posterior aspect of the right parotid gland with significant enlargement compared to the prior exam. This may represent an enlarged lymph node versus a primary parotid neoplasm. Consider MRI with without contrast for further evaluation.  Electronically Signed   By: Toribio Agreste M.D.   On: 04/06/2024 16:15   CT Cervical Spine Wo Contrast Result Date: 04/06/2024 CLINICAL DATA:  Fall with head injury. EXAM: CT HEAD WITHOUT CONTRAST CT CERVICAL SPINE WITHOUT CONTRAST TECHNIQUE: Multidetector CT imaging of the head and cervical spine was performed following the standard protocol without intravenous contrast. Multiplanar CT image reconstructions of the cervical spine were also generated. RADIATION DOSE REDUCTION: This exam was performed according to the departmental dose-optimization program which includes automated exposure control, adjustment of the mA and/or kV according to patient size and/or use of iterative reconstruction technique. COMPARISON:  CT cervical spine 07/21/2017. FINDINGS: CT HEAD FINDINGS Brain: Ventricles, cisterns and other CSF spaces are normal. Minimal chronic ischemic microvascular disease. No mass, mass effect, shift of midline structures or acute hemorrhage. No evidence of acute infarction. Vascular: No hyperdense vessel or unexpected calcification. Skull: Normal. Negative for fracture or focal lesion. Sinuses/Orbits: Orbits are normal. Minimal opacification over the right sphenoid sinus compatible mild chronic inflammatory change. Other: None. CT CERVICAL SPINE FINDINGS Alignment: Subtle anterior stairstep subluxations over the mid to lower cervical spine unchanged. No posttraumatic subluxation. Skull base and vertebrae: Vertebral body heights are normal. There is mild to moderate spondylosis throughout the cervical spine to include uncovertebral joint spurring and facet arthropathy. Mild to moderate left-sided neural foraminal narrowing at the C2-3 level. Mild bilateral neural foraminal narrowing at the C3-4 level and moderate right-sided neural foraminal narrowing at the C4-5 level. Minimal left-sided neural foraminal narrowing at the C5-6 level. No acute fracture. Prevertebral soft tissues are normal. Soft tissues and  spinal canal: No spinal canal abnormality. Prevertebral soft tissues normal. Disc levels: Disc space narrowing at the C5-6 and C6-7 levels. Also disc space narrowing at the C7-T1 level. Upper chest: No acute findings. Other: Oval mass over the posterior aspect of the right parotid gland measuring 1.2 cm in short axis with significant enlargement compared to the prior exam. IMPRESSION: 1. No acute brain injury. 2. Minimal chronic ischemic microvascular disease. 3. No acute cervical spine injury. 4. Mild to moderate spondylosis throughout the cervical spine with disc disease at the C5-6, C6-7 and C7-T1 levels. Multilevel neural foraminal narrowing as described. 5. 1.2 cm oval mass over the posterior aspect  of the right parotid gland with significant enlargement compared to the prior exam. This may represent an enlarged lymph node versus a primary parotid neoplasm. Consider MRI with without contrast for further evaluation. Electronically Signed   By: Toribio Agreste M.D.   On: 04/06/2024 16:15    Alm Schneider, DO  [facility]  If 7PM-7AM, please contact night-coverage [email or web address] Password [email or web address] 04/17/2024, 8:54 AM   LOS: 3 days

## 2024-04-17 NOTE — Plan of Care (Signed)
  Problem: Education: Goal: Knowledge of General Education information will improve Description: Including pain rating scale, medication(s)/side effects and non-pharmacologic comfort measures Outcome: Progressing   Problem: Health Behavior/Discharge Planning: Goal: Ability to manage health-related needs will improve Outcome: Progressing   Problem: Clinical Measurements: Goal: Ability to maintain clinical measurements within normal limits will improve Outcome: Progressing Goal: Will remain free from infection Outcome: Progressing Goal: Diagnostic test results will improve Outcome: Progressing Goal: Respiratory complications will improve Outcome: Progressing Goal: Cardiovascular complication will be avoided Outcome: Not Progressing   Problem: Activity: Goal: Risk for activity intolerance will decrease Outcome: Not Progressing   Problem: Nutrition: Goal: Adequate nutrition will be maintained Outcome: Not Progressing   Problem: Coping: Goal: Level of anxiety will decrease Outcome: Progressing   Problem: Elimination: Goal: Will not experience complications related to bowel motility Outcome: Not Progressing Goal: Will not experience complications related to urinary retention Outcome: Progressing   Problem: Pain Managment: Goal: General experience of comfort will improve and/or be controlled Outcome: Progressing   Problem: Safety: Goal: Ability to remain free from injury will improve Outcome: Progressing   Problem: Skin Integrity: Goal: Risk for impaired skin integrity will decrease Outcome: Progressing

## 2024-04-17 NOTE — Progress Notes (Addendum)
 Gastroenterology Progress Note   Referring Provider: No ref. provider found Primary Care Physician:  Anita Bernardino [name], FNP Primary Gastroenterologist:  Dr. Rosalie  Patient ID: Jeffrey Stewart; [id number]; 07-13-1935   Subjective:    Notified by nursing this morning that patient had a medium red stool. He had stat CTA bleeding scan done. Patient denies abdominal pain. Notes his stomach has been gurgling. Vomited once today but really mostly sputum that was hard to get up. No blood noted.   Objective:   Vital signs in last 24 hours: Temp:  [97.8 F (36.6 C)-98.4 F (36.9 C)] 98.2 F (36.8 C) (10/02 0744) Pulse Rate:  [88-117] 105 (10/02 0715) Resp:  [16-29] 27 (10/02 0715) BP: (92-127)/(37-85) 120/84 (10/02 0715) SpO2:  [90 %-100 %] 100 % (10/02 0715) Last BM Date : 04/15/24 General:   Alert,  Well-developed, well-nourished, pleasant and cooperative in NAD Head:  Normocephalic and atraumatic. Eyes:  Sclera clear, no icterus.  Abdomen:  Soft, nontender and nondistended.   Normal bowel sounds, without guarding, and without rebound.   Extremities:  1+ edema at right ankle. No edema left ankle. Neurologic:  Alert and  oriented x4;  grossly normal neurologically. Skin:  Intact without significant lesions or rashes. +bruising right leg Psych:  Alert and cooperative. Normal mood and affect.  Intake/Output from previous day: 10/01 0701 - 10/02 0700 In: 411.9 [P.O.:240; I.V.:171.9] Out: 1450 [Urine:1450] Intake/Output this shift: No intake/output data recorded.  Lab Results: CBC Recent Labs    04/15/24 1154 04/16/24 0506 04/16/24 1214 04/17/24 0435  WBC 25.1* 17.7*  --  13.9*  HGB 9.5* 7.8* 7.7* 7.5*  HCT 28.5* 24.6* 24.4* 23.6*  MCV 95.6 98.4  --  99.6  PLT 239 188  --  192   BMET Recent Labs    04/15/24 1154 04/16/24 0506 04/17/24 0435  NA 140 139 140  K 5.2* 4.4 3.9  CL 107 108 108  CO2 21* 20* 23  GLUCOSE 175* 162* 173*  BUN 87* 81* 59*  CREATININE 2.06* 1.80*  1.29*  CALCIUM  7.6* 7.4* 7.7*   LFTs Recent Labs    04/14/24 1422  BILITOT 1.7*  ALKPHOS 42  AST 26  ALT 13  PROT 4.7*  ALBUMIN  2.6*   Recent Labs    04/14/24 1422  LIPASE 30   PT/INR Recent Labs    04/14/24 1422  LABPROT 15.0  INR 1.1         Imaging Studies: CT ANGIO GI BLEED Result Date: 04/17/2024 CLINICAL DATA:  persistent hematochezia, blood loss anemia EXAM: CTA ABDOMEN AND PELVIS WITHOUT AND WITH CONTRAST TECHNIQUE: Initially, noncontrast CT of the abdomen and pelvis were performed. Subsequently, Multidetector CT imaging of the abdomen and pelvis was performed using the standard protocol during bolus administration of intravenous contrast. Multiplanar reconstructed images and MIPs were obtained and reviewed to evaluate the vascular anatomy. RADIATION DOSE REDUCTION: This exam was performed according to the departmental dose-optimization program which includes automated exposure control, adjustment of the mA and/or kV according to patient size and/or use of iterative reconstruction technique. CONTRAST:  80mL OMNIPAQUE  IOHEXOL  350 MG/ML SOLN COMPARISON:  04/14/2024 FINDINGS: VASCULAR Aorta: No aortic aneurysm or dissection. Diffuse aortic atherosclerosis. No hemodynamically significant stenosis. Celiac: Patent without acute thrombus, aneurysm, or dissection.No hemodynamically significant stenosis. SMA: Patent without acute thrombus, aneurysm, or dissection.No hemodynamically significant stenosis. Renals: Patent without acute thrombus, aneurysm, or dissection.Mild stenosis of both renal artery ostia from calcified plaque. IMA: Patent without acute thrombus, aneurysm, or  dissection.Moderate narrowing of the IMA ostium from calcified plaque. Inflow: Patent without acute thrombus, aneurysm, or dissection.Diffuse calcified atherosclerosis throughout the inflow vessels without hemodynamically significant stenosis. Proximal Outflow: The bilateral common femoral and visualized portions  of the superficial and profunda femoral arteries are patent without acute thrombus, aneurysm, or dissection.No hemodynamically significant stenosis. Veins: No obvious venous abnormality within the limitations of this arterial phase study. Review of the MIP images confirms the above findings. NON-VASCULAR Lower chest: No focal airspace consolidation or pleural effusion.Posterior bibasilar dependent atelectasis. Multifocal scarring also present in the lung bases. Hepatobiliary: No mass.No radiopaque stones or wall thickening of the gallbladder.No intrahepatic or extrahepatic biliary ductal dilation. Pancreas: No mass or main ductal dilation.No peripancreatic inflammation or fluid collection. Spleen: Normal size. No mass. Adrenals/Urinary Tract: No adrenal masses. No renal mass. No hydronephrosis or nephrolithiasis. The urinary bladder is distended without focal abnormality. Stomach/Bowel: The stomach is decompressed without focal abnormality. No small bowel wall thickening or inflammation. No small bowel obstruction.The appendix was not visualized. No right lower quadrant or pericecal inflammatory changes to suggest acute appendicitis. Hyperdense enteric material or contrast within the cecum and ascending colon. Scattered colonic diverticulosis. GI Bleed: No extravasation of contrast to suggest active GI bleeding. Lymphatic: No intraabdominal or pelvic lymphadenopathy. Reproductive: No prostatomegaly.No free pelvic fluid. Other: No pneumoperitoneum, ascites, or mesenteric inflammation. Bilateral inguinal hernia repairs. Musculoskeletal: No acute fracture or destructive lesion.Partially visualized right femoral intramedullary nail with persistent fracture of the right intertrochanteric femoral neck. Likely postsurgical edema along the right hip. Multilevel degenerative disc disease of the spine. IMPRESSION: VASCULAR 1. No aortic aneurysm, intramural hematoma, or aortic dissection. 2. Hyperdense enteric material or  contrast within the cecum and ascending colon, which limits evaluation for GI bleed in this region. Otherwise, no extravasation of contrast in the remainder of the GI tract to suggest active GI bleeding. NON-VASCULAR No additional, acute findings within the abdomen or pelvis. Aortic Atherosclerosis (ICD10-I70.0). Electronically Signed   By: Rogelia Myers M.D.   On: 04/17/2024 09:58   CT CHEST WO CONTRAST Result Date: 04/16/2024 CLINICAL DATA:  Respiratory illness, nondiagnostic xray RUL opacity on CXR. EXAM: CT CHEST WITHOUT CONTRAST TECHNIQUE: Multidetector CT imaging of the chest was performed following the standard protocol without IV contrast. RADIATION DOSE REDUCTION: This exam was performed according to the departmental dose-optimization program which includes automated exposure control, adjustment of the mA and/or kV according to patient size and/or use of iterative reconstruction technique. COMPARISON:  Chest radiograph from 04/15/2024. FINDINGS: Cardiovascular: Normal cardiac size. No pericardial effusion. No aortic aneurysm. There are coronary artery calcifications, in keeping with coronary artery disease. There are also moderate peripheral atherosclerotic vascular calcifications of thoracic aorta and its major branches. Aberrant origin of right subclavian artery noted, which arises distal to the left subclavian artery and courses towards the right side, posterior to the esophagus. Mediastinum/Nodes: Visualized thyroid gland appears grossly unremarkable. No solid / cystic mediastinal masses. The esophagus is nondistended precluding optimal assessment. There are few mildly prominent mediastinal lymph nodes, which do not meet the size criteria for lymphadenopathy and appear grossly similar to the prior study, favoring benign etiology. No axillary lymphadenopathy by size criteria. Evaluation of bilateral hila is limited due to lack on intravenous contrast: however, no large hilar lymphadenopathy  identified. Lungs/Pleura: The central tracheo-bronchial tree is patent. There is a new irregular solid noncalcified 1.0 x 2.2 cm opacity in the subpleural right upper lobe. There are several additional linear areas of scarring/atelectasis throughout bilateral lungs, which  are grossly similar to the prior study. No lung collapse, pleural effusion or pneumothorax. No pulmonary edema. There is a 5 x 5 mm ground-glass nodule in the left lung lower lobe, which is also new since the prior study. Upper Abdomen: There is small sliding hiatal hernia. Visualized upper abdominal viscera within normal limits. Musculoskeletal: Right-sided PICC line noted with its tip in the cavo-atrial junction region. Visualized soft tissues of the chest wall are otherwise grossly unremarkable. No suspicious osseous lesions. There are mild multilevel degenerative changes in the visualized spine. IMPRESSION: 1. There is a new irregular solid noncalcified 1.0 x 2.2 cm opacity in the subpleural right upper lobe. This opacity corresponds to the density described on the recent chest radiograph. This is suspicious. Pulmonary consultation is recommended. 2. There is also a new 5 x 5 mm ground-glass nodule in the left lung lower lobe. This is also indeterminate and differential diagnosis includes infectious/inflammatory etiology versus neoplastic process. 3. Multiple other nonacute observations, as described above. Aortic Atherosclerosis (ICD10-I70.0). Electronically Signed   By: Ree Molt M.D.   On: 04/16/2024 16:44   US  RENAL Result Date: 04/16/2024 CLINICAL DATA:  Acute kidney injury. EXAM: RENAL / URINARY TRACT ULTRASOUND COMPLETE COMPARISON:  None Available. FINDINGS: Right Kidney: Renal measurements: 10.9 x 5.2 x 5.3 cm = volume: 156 mL. Increased cortical echogenicity. Mild cortical atrophy. No hydronephrosis. Left Kidney: Renal measurements: 13.3 x 5.9 x 5.5 cm = volume: 225 mL. Mildly increased cortical echogenicity. No hydronephrosis.  Bladder: Appears normal for degree of bladder distention. Other: None. IMPRESSION: Increased cortical echogenicity of both kidneys consistent with chronic kidney disease. No evidence of hydronephrosis. Electronically Signed   By: Marcey Moan M.D.   On: 04/16/2024 14:09   DG Chest Portable 1 View Result Date: 04/15/2024 CLINICAL DATA:  PICC placement. EXAM: PORTABLE CHEST 1 VIEW COMPARISON:  April 14, 2024. FINDINGS: Stable cardiomediastinal silhouette. Right-sided PICC line is noted with distal tip in expected position of the SVC. Minimal bibasilar subsegmental atelectasis or scarring is noted with probable left basilar pleural thickening. New irregular nodular density seen in right upper lobe; CT scan of the chest is recommended for further evaluation. IMPRESSION: 1. Right-sided PICC line is noted with distal tip in expected position of the SVC. 2. New irregular nodular density seen in right upper lobe; CT scan of the chest is recommended for further evaluation. Electronically Signed   By: Lynwood Landy Raddle M.D.   On: 04/15/2024 17:10   US  EKG SITE RITE Result Date: 04/15/2024 If Site Rite image not attached, placement could not be confirmed due to current cardiac rhythm.  DG Chest Port 1 View Result Date: 04/14/2024 CLINICAL DATA:  Questionable sepsis. EXAM: PORTABLE CHEST 1 VIEW COMPARISON:  Chest radiograph dated 04/06/2024 FINDINGS: Shallow inspiration with bibasilar atelectasis. No consolidative changes. No pleural effusion pneumothorax. Stable cardiac silhouette. No acute osseous pathology. IMPRESSION: Shallow inspiration with bibasilar atelectasis. No focal consolidation. Electronically Signed   By: Vanetta Chou M.D.   On: 04/14/2024 17:30   CT ABDOMEN PELVIS WO CONTRAST Result Date: 04/14/2024 CLINICAL DATA:  Abdominal pain.  Hematemesis. EXAM: CT ABDOMEN AND PELVIS WITHOUT CONTRAST TECHNIQUE: Multidetector CT imaging of the abdomen and pelvis was performed following the standard  protocol without IV contrast. RADIATION DOSE REDUCTION: This exam was performed according to the departmental dose-optimization program which includes automated exposure control, adjustment of the mA and/or kV according to patient size and/or use of iterative reconstruction technique. COMPARISON:  Abdominal ultrasound dated 11/04/2021. FINDINGS:  Evaluation of this exam is limited in the absence of intravenous contrast. Lower chest: Bibasilar subpleural atelectasis/scarring. There is coronary vascular calcification. No intra-abdominal free air or free fluid. Hepatobiliary: The liver is unremarkable. No biliary dilatation. The gallbladder is unremarkable. Pancreas: Unremarkable. No pancreatic ductal dilatation or surrounding inflammatory changes. Spleen: Normal in size without focal abnormality. Adrenals/Urinary Tract: The adrenal glands unremarkable. There is no hydronephrosis or nephrolithiasis on either side. Bilateral perinephric stranding, nonspecific. Correlation with urinalysis recommended to exclude UTI. The visualized ureters and urinary bladder appear unremarkable. Stomach/Bowel: There is loose stool throughout the colon suggestive of diarrheal state. Correlation with clinical exam and stool cultures recommended. There is no bowel obstruction or active inflammation. Appendectomy. Vascular/Lymphatic: Moderate aortoiliac atherosclerotic disease. The IVC is unremarkable. No portal venous gas. There is no adenopathy. Reproductive: The prostate and seminal vesicles are grossly unremarkable. Other: Small fat containing bilateral inguinal hernias. Anterior pelvic wall hernia repair mesh. Musculoskeletal: Osteopenia with degenerative changes of the spine. Status post ORIF of right femoral neck fracture. There is edema in the surrounding soft tissues of the right hip. No fluid collection. IMPRESSION: 1. Diarrheal state. Correlation with clinical exam and stool cultures recommended. No bowel obstruction. 2. No  hydronephrosis or nephrolithiasis. 3.  Aortic Atherosclerosis (ICD10-I70.0). Electronically Signed   By: Vanetta Chou M.D.   On: 04/14/2024 17:29   DG HIP UNILAT WITH PELVIS 2-3 VIEWS RIGHT Result Date: 04/07/2024 CLINICAL DATA:  Postop internal fixation EXAM: DG HIP (WITH OR WITHOUT PELVIS) 2-3V RIGHT COMPARISON:  04/06/2024 FINDINGS: Changes of internal fixation across the right femoral intertrochanteric fracture. Anatomic alignment. No hardware complicating feature. IMPRESSION: Internal fixation.  No hardware complicating feature. Electronically Signed   By: Franky Crease M.D.   On: 04/07/2024 23:15   DG HIP UNILAT WITH PELVIS 2-3 VIEWS RIGHT Result Date: 04/07/2024 CLINICAL DATA:  Right hip internal fixation. EXAM: DG HIP (WITH OR WITHOUT PELVIS) 2-3V RIGHT COMPARISON:  04/06/2024 FINDINGS: Multiple intraoperative spot images demonstrate internal fixation across the right femoral intertrochanteric fracture. No hardware complicating feature. IMPRESSION: Internal fixation with anatomic alignment.  No complicating feature. Electronically Signed   By: Franky Crease M.D.   On: 04/07/2024 23:07   DG C-Arm 1-60 Min-No Report Result Date: 04/07/2024 Fluoroscopy was utilized by the requesting physician.  No radiographic interpretation.   DG Chest Port 1 View Result Date: 04/06/2024 CLINICAL DATA:  Pre-surgical. EXAM: RIGHT KNEE - 1-2 VIEW; PORTABLE CHEST - 1 VIEW COMPARISON:  09/08/2023, 04/06/2024. FINDINGS: Right knee: No acute fracture or dislocation is seen. There is a bony exostosis along the lateral aspect of the mid femur mild to moderate tricompartmental degenerative changes are noted at the knee. There is a trace joint effusion. Vascular calcifications are present in the soft tissues. Chest: The heart is enlarged and the mediastinal contour is within normal limits for supine positioning. Atherosclerotic calcification of the aorta is noted. Lung volumes are low with atelectasis or infiltrate at the  lung bases. There small bilateral pleural effusions. No pneumothorax is seen. No acute osseous abnormality. IMPRESSION: 1. No acute fracture or dislocation at the right knee. 2. Small bilateral pleural effusions with atelectasis or infiltrate at the lung bases. 3. Cardiomegaly. Electronically Signed   By: Leita Birmingham M.D.   On: 04/06/2024 17:29   DG Knee 1-2 Views Right Result Date: 04/06/2024 CLINICAL DATA:  Pre-surgical. EXAM: RIGHT KNEE - 1-2 VIEW; PORTABLE CHEST - 1 VIEW COMPARISON:  09/08/2023, 04/06/2024. FINDINGS: Right knee: No acute fracture or dislocation is  seen. There is a bony exostosis along the lateral aspect of the mid femur mild to moderate tricompartmental degenerative changes are noted at the knee. There is a trace joint effusion. Vascular calcifications are present in the soft tissues. Chest: The heart is enlarged and the mediastinal contour is within normal limits for supine positioning. Atherosclerotic calcification of the aorta is noted. Lung volumes are low with atelectasis or infiltrate at the lung bases. There small bilateral pleural effusions. No pneumothorax is seen. No acute osseous abnormality. IMPRESSION: 1. No acute fracture or dislocation at the right knee. 2. Small bilateral pleural effusions with atelectasis or infiltrate at the lung bases. 3. Cardiomegaly. Electronically Signed   By: Leita Birmingham M.D.   On: 04/06/2024 17:29   DG Hip Unilat W or Wo Pelvis 2-3 Views Right Result Date: 04/06/2024 CLINICAL DATA:  Fall with diffuse pain. EXAM: DG HIP (WITH OR WITHOUT PELVIS) 2-3V RIGHT COMPARISON:  None Available. FINDINGS: There is diffusely decreased mineralization of the bones. There is a nondisplaced intratrochanteric fracture of the proximal right femur. The remaining bony structures are intact and there is no dislocation. Degenerative changes are present at the bilateral hips, sacroiliac joints and lower lumbar spine. Vascular calcifications are seen in the soft tissues.  IMPRESSION: Minimally displaced intertrochanteric fracture of the proximal right femur. Electronically Signed   By: Leita Birmingham M.D.   On: 04/06/2024 16:22   CT Head Wo Contrast Result Date: 04/06/2024 CLINICAL DATA:  Fall with head injury. EXAM: CT HEAD WITHOUT CONTRAST CT CERVICAL SPINE WITHOUT CONTRAST TECHNIQUE: Multidetector CT imaging of the head and cervical spine was performed following the standard protocol without intravenous contrast. Multiplanar CT image reconstructions of the cervical spine were also generated. RADIATION DOSE REDUCTION: This exam was performed according to the departmental dose-optimization program which includes automated exposure control, adjustment of the mA and/or kV according to patient size and/or use of iterative reconstruction technique. COMPARISON:  CT cervical spine 07/21/2017. FINDINGS: CT HEAD FINDINGS Brain: Ventricles, cisterns and other CSF spaces are normal. Minimal chronic ischemic microvascular disease. No mass, mass effect, shift of midline structures or acute hemorrhage. No evidence of acute infarction. Vascular: No hyperdense vessel or unexpected calcification. Skull: Normal. Negative for fracture or focal lesion. Sinuses/Orbits: Orbits are normal. Minimal opacification over the right sphenoid sinus compatible mild chronic inflammatory change. Other: None. CT CERVICAL SPINE FINDINGS Alignment: Subtle anterior stairstep subluxations over the mid to lower cervical spine unchanged. No posttraumatic subluxation. Skull base and vertebrae: Vertebral body heights are normal. There is mild to moderate spondylosis throughout the cervical spine to include uncovertebral joint spurring and facet arthropathy. Mild to moderate left-sided neural foraminal narrowing at the C2-3 level. Mild bilateral neural foraminal narrowing at the C3-4 level and moderate right-sided neural foraminal narrowing at the C4-5 level. Minimal left-sided neural foraminal narrowing at the C5-6 level.  No acute fracture. Prevertebral soft tissues are normal. Soft tissues and spinal canal: No spinal canal abnormality. Prevertebral soft tissues normal. Disc levels: Disc space narrowing at the C5-6 and C6-7 levels. Also disc space narrowing at the C7-T1 level. Upper chest: No acute findings. Other: Oval mass over the posterior aspect of the right parotid gland measuring 1.2 cm in short axis with significant enlargement compared to the prior exam. IMPRESSION: 1. No acute brain injury. 2. Minimal chronic ischemic microvascular disease. 3. No acute cervical spine injury. 4. Mild to moderate spondylosis throughout the cervical spine with disc disease at the C5-6, C6-7 and C7-T1 levels. Multilevel neural  foraminal narrowing as described. 5. 1.2 cm oval mass over the posterior aspect of the right parotid gland with significant enlargement compared to the prior exam. This may represent an enlarged lymph node versus a primary parotid neoplasm. Consider MRI with without contrast for further evaluation. Electronically Signed   By: Toribio Agreste M.D.   On: 04/06/2024 16:15   CT Cervical Spine Wo Contrast Result Date: 04/06/2024 CLINICAL DATA:  Fall with head injury. EXAM: CT HEAD WITHOUT CONTRAST CT CERVICAL SPINE WITHOUT CONTRAST TECHNIQUE: Multidetector CT imaging of the head and cervical spine was performed following the standard protocol without intravenous contrast. Multiplanar CT image reconstructions of the cervical spine were also generated. RADIATION DOSE REDUCTION: This exam was performed according to the departmental dose-optimization program which includes automated exposure control, adjustment of the mA and/or kV according to patient size and/or use of iterative reconstruction technique. COMPARISON:  CT cervical spine 07/21/2017. FINDINGS: CT HEAD FINDINGS Brain: Ventricles, cisterns and other CSF spaces are normal. Minimal chronic ischemic microvascular disease. No mass, mass effect, shift of midline structures  or acute hemorrhage. No evidence of acute infarction. Vascular: No hyperdense vessel or unexpected calcification. Skull: Normal. Negative for fracture or focal lesion. Sinuses/Orbits: Orbits are normal. Minimal opacification over the right sphenoid sinus compatible mild chronic inflammatory change. Other: None. CT CERVICAL SPINE FINDINGS Alignment: Subtle anterior stairstep subluxations over the mid to lower cervical spine unchanged. No posttraumatic subluxation. Skull base and vertebrae: Vertebral body heights are normal. There is mild to moderate spondylosis throughout the cervical spine to include uncovertebral joint spurring and facet arthropathy. Mild to moderate left-sided neural foraminal narrowing at the C2-3 level. Mild bilateral neural foraminal narrowing at the C3-4 level and moderate right-sided neural foraminal narrowing at the C4-5 level. Minimal left-sided neural foraminal narrowing at the C5-6 level. No acute fracture. Prevertebral soft tissues are normal. Soft tissues and spinal canal: No spinal canal abnormality. Prevertebral soft tissues normal. Disc levels: Disc space narrowing at the C5-6 and C6-7 levels. Also disc space narrowing at the C7-T1 level. Upper chest: No acute findings. Other: Oval mass over the posterior aspect of the right parotid gland measuring 1.2 cm in short axis with significant enlargement compared to the prior exam. IMPRESSION: 1. No acute brain injury. 2. Minimal chronic ischemic microvascular disease. 3. No acute cervical spine injury. 4. Mild to moderate spondylosis throughout the cervical spine with disc disease at the C5-6, C6-7 and C7-T1 levels. Multilevel neural foraminal narrowing as described. 5. 1.2 cm oval mass over the posterior aspect of the right parotid gland with significant enlargement compared to the prior exam. This may represent an enlarged lymph node versus a primary parotid neoplasm. Consider MRI with without contrast for further evaluation.  Electronically Signed   By: Toribio Agreste M.D.   On: 04/06/2024 16:15  [2 weeks]  Assessment:   MAZE CORNIEL is an 89 y.o. year old male with a history of chronic heart failure, CAD, HLD, ILD,  polymyalgia rheumatica and RA on chronic 6 mg prednisone  daily and chronic cellcept , pulmonary fibrosis, RBBB, mechanical fall last week and found to have right intertrochanteric femur fracture and underwent ORIF with intramedullary rod 04/07/24 at [facility], requiring 1 unit PRBCs while hospitalized due to acute blood loss anemia post procedure,  discharged 9/27 to home at family request instead of SNF, now presenting this morning to [facility] ED with acute episodes of hematemesis and melena. Hgb found to be 6.3, down from discharge of 7.1. GI consulted due  to acute GI Bleed.    Acute on chronic anemia: in setting of hematemesis/melena.  Hgb 6.3, down from 7.2 at discharge several days ago. Hx of chronic macrocytic anemia and fluctuating thrombocytopenia, previously evaluated by Hematology with plans for bone marrow biopsy if worsening anemia. Baseline Hgb 10-12 range. In setting of chronic prednisone , cellcept , increased 81 mg aspirin  to BID, recent acute hospitalization, suspect possible PUD. CT with no evidence of cirrhosis. This admission he developed hemorrhagic shock and requiring levophed, worsening renal function with renal failure with associated hyperkalemia, increased lactic acid required additional blood.  -Hgb 6.3 on admission down from 7.2 9/27 -Hgb 7.5 this AM, down from 7.7 yesterday. -He has received 4 units of packed red blood cells and currently receiving his fifth unit -2 episodes of hematemesis with clots prior to admission but no further episodes  -reported black stools prior to onset of hematemsis -EGD 04/15/24 with 3 Non-bleeding gastric ulcers (largest 20mm) with a flat pigmented spot (Forrest Class IIc). Injected. Treated with bipolar cautery. Biopsied (slight chronic inflammation and focal  intestinal metaplasia.  No H. pylori.) -medium dark red stool this morning, estimated 100-200cc per nursing -BP this morning soft but stable, remains on Levophed at 2mcg/min but decreased to 1mcg/min recently -CTA GI bleeding scan 10/2 Exam was limited for GI bleeding in the region of the cecum and ascending colon due to hyperdense enteric material or contrast otherwise no extravasation of contrast in the remainder of the GI tract to suggest active GI bleeding.  Updated history: patient reports last colonoscopy by Dr. Rosalie five years ago was clear.    Leukocytosis: WBC count had been up to 30, down to 13.9 today. afebrile, blood cultures no growth at 3 days.  Repeat blood cultures ordered today.  CT abdomen/pelvis without contrast noting diarrheal state but no active inflammation; nursing staff denying diarrhea but mostly seeing blood. Partly reactive.  CT chest abnormal with 2 suspicious lesions noted in the lungs.      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       Plan:   Transfuse prbcs as needed. Repeat H/H one hour post transfusion complete. Keep NPO in case he needs repeat EGD today. (If he shows significant drop in his H/H or additional bleeding he will need EGD). May have medications with sips of liquids.  Monitor closely for bleeding.  Trend H/H. Continue IV pantoprazole BID for 72 hours No NSAIDs Carafate suspension 1g QID for 8 weeks Repeat EGD in 2 months.      LOS: 3 days   Sonny RAMAN. Ezzard RIGGERS [facility] [phone] 10/2/202510:21 AM

## 2024-04-17 NOTE — Progress Notes (Signed)
 2 mth EGD noted in recall Patient result letter mailed

## 2024-04-18 ENCOUNTER — Encounter (HOSPITAL_COMMUNITY): Payer: Self-pay | Admitting: Internal Medicine

## 2024-04-18 DIAGNOSIS — J849 Interstitial pulmonary disease, unspecified: Secondary | ICD-10-CM | POA: Diagnosis not present

## 2024-04-18 DIAGNOSIS — D649 Anemia, unspecified: Secondary | ICD-10-CM | POA: Diagnosis not present

## 2024-04-18 DIAGNOSIS — K253 Acute gastric ulcer without hemorrhage or perforation: Secondary | ICD-10-CM

## 2024-04-18 DIAGNOSIS — N179 Acute kidney failure, unspecified: Secondary | ICD-10-CM | POA: Diagnosis not present

## 2024-04-18 DIAGNOSIS — K922 Gastrointestinal hemorrhage, unspecified: Secondary | ICD-10-CM | POA: Diagnosis not present

## 2024-04-18 LAB — BPAM RBC
Blood Product Expiration Date: 202510212359
Blood Product Expiration Date: 202510252359
Blood Product Expiration Date: 202510262359
Blood Product Expiration Date: 202510262359
Blood Product Expiration Date: 202510212359
Blood Product Expiration Date: 202511012359
ISSUE DATE / TIME: 202509300356
ISSUE DATE / TIME: 202509300607
ISSUE DATE / TIME: 202509300823
ISSUE DATE / TIME: 202510021026
ISSUE DATE / TIME: 202509291436
ISSUE DATE / TIME: 202509291619
Unit Type and Rh: 5100
Unit Type and Rh: 5100
Unit Type and Rh: 5100
Unit Type and Rh: 5100
Unit Type and Rh: 5100
Unit Type and Rh: 9500

## 2024-04-18 LAB — TYPE AND SCREEN
ABO/RH(D): O POS
Antibody Screen: NEGATIVE
Unit division: 0
Unit division: 0
Unit division: 0
Unit division: 0
Unit division: 0
Unit division: 0

## 2024-04-18 LAB — CBC WITH DIFFERENTIAL/PLATELET
Abs Immature Granulocytes: 1 K/uL — ABNORMAL HIGH (ref 0.00–0.07)
Basophils Absolute: 0 K/uL (ref 0.0–0.1)
Basophils Relative: 0 %
Eosinophils Absolute: 0 K/uL (ref 0.0–0.5)
Eosinophils Relative: 0 %
HCT: 24.9 % — ABNORMAL LOW (ref 39.0–52.0)
Hemoglobin: 8 g/dL — ABNORMAL LOW (ref 13.0–17.0)
Lymphocytes Relative: 18 %
Lymphs Abs: 2.1 K/uL (ref 0.7–4.0)
MCH: 32.1 pg (ref 26.0–34.0)
MCHC: 32.1 g/dL (ref 30.0–36.0)
MCV: 100 fL (ref 80.0–100.0)
Metamyelocytes Relative: 2 %
Monocytes Absolute: 0.4 K/uL (ref 0.1–1.0)
Monocytes Relative: 3 %
Myelocytes: 2 %
Neutro Abs: 8.4 K/uL — ABNORMAL HIGH (ref 1.7–7.7)
Neutrophils Relative %: 71 %
Platelets: 171 K/uL (ref 150–400)
Promyelocytes Relative: 4 %
RBC: 2.49 MIL/uL — ABNORMAL LOW (ref 4.22–5.81)
RDW: 19.6 % — ABNORMAL HIGH (ref 11.5–15.5)
Smear Review: NORMAL
WBC: 11.9 K/uL — ABNORMAL HIGH (ref 4.0–10.5)
nRBC: 1.1 % — ABNORMAL HIGH (ref 0.0–0.2)

## 2024-04-18 LAB — BASIC METABOLIC PANEL WITH GFR
Anion gap: 7 (ref 5–15)
BUN: 42 mg/dL — ABNORMAL HIGH (ref 8–23)
CO2: 21 mmol/L — ABNORMAL LOW (ref 22–32)
Calcium: 7.8 mg/dL — ABNORMAL LOW (ref 8.9–10.3)
Chloride: 111 mmol/L (ref 98–111)
Creatinine, Ser: 1.02 mg/dL (ref 0.61–1.24)
GFR, Estimated: >60 mL/min (ref >60)
Glucose, Bld: 142 mg/dL — ABNORMAL HIGH (ref 70–99)
Potassium: 3.9 mmol/L (ref 3.5–5.1)
Sodium: 139 mmol/L (ref 135–145)

## 2024-04-18 MED ORDER — MIDODRINE HCL 5 MG PO TABS
5.0000 mg | ORAL_TABLET | Freq: Three times a day (TID) | ORAL | Status: DC
  Administered 2024-04-18 – 2024-04-20 (×7): 5 mg via ORAL
  Filled 2024-04-18 (×8): qty 1

## 2024-04-18 NOTE — Progress Notes (Signed)
 Gastroenterology Progress Note   Referring Provider: No ref. provider found Primary Care Physician:  Anita Bernardino [name], FNP Primary Gastroenterologist:  Dr. Rosalie  Patient ID: Jeffrey Stewart; [id number]; [birth year]    Subjective   No abdominal pain. Wants to eat/drink. No further bleeding overnight. Having some hip pain and drainage from his hip.   Objective   Vital signs in last 24 hours Temp:  [97.5 F (36.4 C)-98.3 F (36.8 C)] 97.8 F (36.6 C) (10/03 0743) Pulse Rate:  [55-111] 87 (10/03 0800) Resp:  [14-30] 21 (10/03 0800) BP: (87-149)/(27-118) 110/51 (10/03 0800) SpO2:  [40 %-100 %] 99 % (10/03 0800) Last BM Date : 04/15/24  Physical Exam General:   Alert and oriented, pleasant Head:  Normocephalic and atraumatic. Eyes:  No icterus, sclera clear. Conjuctiva pink.  Mouth:  Without lesions, mucosa pink and moist.  Neck:  Supple, without thyromegaly or masses.  Lungs: on nasal cannula.  Abdomen:  Bowel sounds present, soft, non-tender, non-distended. No HSM or hernias noted. No rebound or guarding. No masses appreciated  Neurologic:  Alert and  oriented x4;  grossly normal neurologically. Skin:  Warm and dry, intact without significant lesions.  Psych:  Alert and cooperative. Normal mood and affect.  Intake/Output from previous day: 10/02 0701 - 10/03 0700 In: 1114.1 [I.V.:132.7; Blood:306; IV Piggyback:675.4] Out: 1250 [Urine:1250] Intake/Output this shift: No intake/output data recorded.  Lab Results  Recent Labs    04/16/24 0506 04/16/24 1214 04/17/24 0435 04/17/24 1418 04/18/24 0521  WBC 17.7*  --  13.9*  --  11.9*  HGB 7.8*   < > 7.5* 8.1* 8.0*  HCT 24.6*   < > 23.6* 24.9* 24.9*  PLT 188  --  192  --  171   < > = values in this interval not displayed.   BMET Recent Labs    04/16/24 0506 04/17/24 0435 04/18/24 0521  NA 139 140 139  K 4.4 3.9 3.9  CL 108 108 111  CO2 20* 23 21*  GLUCOSE 162* 173* 142*  BUN 81* 59* 42*  CREATININE 1.80*  1.29* 1.02  CALCIUM  7.4* 7.7* 7.8*   LFT No results for input(s): PROT, ALBUMIN , AST, ALT, ALKPHOS, BILITOT, BILIDIR, IBILI in the last 72 hours. PT/INR No results for input(s): LABPROT, INR in the last 72 hours. Hepatitis Panel No results for input(s): HEPBSAG, HCVAB, HEPAIGM, HEPBIGM in the last 72 hours.  Studies/Results CT ANGIO GI BLEED Result Date: 04/17/2024 CLINICAL DATA:  persistent hematochezia, blood loss anemia EXAM: CTA ABDOMEN AND PELVIS WITHOUT AND WITH CONTRAST TECHNIQUE: Initially, noncontrast CT of the abdomen and pelvis were performed. Subsequently, Multidetector CT imaging of the abdomen and pelvis was performed using the standard protocol during bolus administration of intravenous contrast. Multiplanar reconstructed images and MIPs were obtained and reviewed to evaluate the vascular anatomy. RADIATION DOSE REDUCTION: This exam was performed according to the departmental dose-optimization program which includes automated exposure control, adjustment of the mA and/or kV according to patient size and/or use of iterative reconstruction technique. CONTRAST:  80mL OMNIPAQUE  IOHEXOL  350 MG/ML SOLN COMPARISON:  04/14/2024 FINDINGS: VASCULAR Aorta: No aortic aneurysm or dissection. Diffuse aortic atherosclerosis. No hemodynamically significant stenosis. Celiac: Patent without acute thrombus, aneurysm, or dissection.No hemodynamically significant stenosis. SMA: Patent without acute thrombus, aneurysm, or dissection.No hemodynamically significant stenosis. Renals: Patent without acute thrombus, aneurysm, or dissection.Mild stenosis of both renal artery ostia from calcified plaque. IMA: Patent without acute thrombus, aneurysm, or dissection.Moderate narrowing of the IMA ostium from calcified plaque.  Inflow: Patent without acute thrombus, aneurysm, or dissection.Diffuse calcified atherosclerosis throughout the inflow vessels without hemodynamically significant  stenosis. Proximal Outflow: The bilateral common femoral and visualized portions of the superficial and profunda femoral arteries are patent without acute thrombus, aneurysm, or dissection.No hemodynamically significant stenosis. Veins: No obvious venous abnormality within the limitations of this arterial phase study. Review of the MIP images confirms the above findings. NON-VASCULAR Lower chest: No focal airspace consolidation or pleural effusion.Posterior bibasilar dependent atelectasis. Multifocal scarring also present in the lung bases. Hepatobiliary: No mass.No radiopaque stones or wall thickening of the gallbladder.No intrahepatic or extrahepatic biliary ductal dilation. Pancreas: No mass or main ductal dilation.No peripancreatic inflammation or fluid collection. Spleen: Normal size. No mass. Adrenals/Urinary Tract: No adrenal masses. No renal mass. No hydronephrosis or nephrolithiasis. The urinary bladder is distended without focal abnormality. Stomach/Bowel: The stomach is decompressed without focal abnormality. No small bowel wall thickening or inflammation. No small bowel obstruction.The appendix was not visualized. No right lower quadrant or pericecal inflammatory changes to suggest acute appendicitis. Hyperdense enteric material or contrast within the cecum and ascending colon. Scattered colonic diverticulosis. GI Bleed: No extravasation of contrast to suggest active GI bleeding. Lymphatic: No intraabdominal or pelvic lymphadenopathy. Reproductive: No prostatomegaly.No free pelvic fluid. Other: No pneumoperitoneum, ascites, or mesenteric inflammation. Bilateral inguinal hernia repairs. Musculoskeletal: No acute fracture or destructive lesion.Partially visualized right femoral intramedullary nail with persistent fracture of the right intertrochanteric femoral neck. Likely postsurgical edema along the right hip. Multilevel degenerative disc disease of the spine. IMPRESSION: VASCULAR 1. No aortic aneurysm,  intramural hematoma, or aortic dissection. 2. Hyperdense enteric material or contrast within the cecum and ascending colon, which limits evaluation for GI bleed in this region. Otherwise, no extravasation of contrast in the remainder of the GI tract to suggest active GI bleeding. NON-VASCULAR No additional, acute findings within the abdomen or pelvis. Aortic Atherosclerosis (ICD10-I70.0). Electronically Signed   By: Rogelia Myers M.D.   On: 04/17/2024 09:58   CT CHEST WO CONTRAST Result Date: 04/16/2024 CLINICAL DATA:  Respiratory illness, nondiagnostic xray RUL opacity on CXR. EXAM: CT CHEST WITHOUT CONTRAST TECHNIQUE: Multidetector CT imaging of the chest was performed following the standard protocol without IV contrast. RADIATION DOSE REDUCTION: This exam was performed according to the departmental dose-optimization program which includes automated exposure control, adjustment of the mA and/or kV according to patient size and/or use of iterative reconstruction technique. COMPARISON:  Chest radiograph from 04/15/2024. FINDINGS: Cardiovascular: Normal cardiac size. No pericardial effusion. No aortic aneurysm. There are coronary artery calcifications, in keeping with coronary artery disease. There are also moderate peripheral atherosclerotic vascular calcifications of thoracic aorta and its major branches. Aberrant origin of right subclavian artery noted, which arises distal to the left subclavian artery and courses towards the right side, posterior to the esophagus. Mediastinum/Nodes: Visualized thyroid gland appears grossly unremarkable. No solid / cystic mediastinal masses. The esophagus is nondistended precluding optimal assessment. There are few mildly prominent mediastinal lymph nodes, which do not meet the size criteria for lymphadenopathy and appear grossly similar to the prior study, favoring benign etiology. No axillary lymphadenopathy by size criteria. Evaluation of bilateral hila is limited due to  lack on intravenous contrast: however, no large hilar lymphadenopathy identified. Lungs/Pleura: The central tracheo-bronchial tree is patent. There is a new irregular solid noncalcified 1.0 x 2.2 cm opacity in the subpleural right upper lobe. There are several additional linear areas of scarring/atelectasis throughout bilateral lungs, which are grossly similar to the prior study. No lung  collapse, pleural effusion or pneumothorax. No pulmonary edema. There is a 5 x 5 mm ground-glass nodule in the left lung lower lobe, which is also new since the prior study. Upper Abdomen: There is small sliding hiatal hernia. Visualized upper abdominal viscera within normal limits. Musculoskeletal: Right-sided PICC line noted with its tip in the cavo-atrial junction region. Visualized soft tissues of the chest wall are otherwise grossly unremarkable. No suspicious osseous lesions. There are mild multilevel degenerative changes in the visualized spine. IMPRESSION: 1. There is a new irregular solid noncalcified 1.0 x 2.2 cm opacity in the subpleural right upper lobe. This opacity corresponds to the density described on the recent chest radiograph. This is suspicious. Pulmonary consultation is recommended. 2. There is also a new 5 x 5 mm ground-glass nodule in the left lung lower lobe. This is also indeterminate and differential diagnosis includes infectious/inflammatory etiology versus neoplastic process. 3. Multiple other nonacute observations, as described above. Aortic Atherosclerosis (ICD10-I70.0). Electronically Signed   By: Ree Molt M.D.   On: 04/16/2024 16:44   US  RENAL Result Date: 04/16/2024 CLINICAL DATA:  Acute kidney injury. EXAM: RENAL / URINARY TRACT ULTRASOUND COMPLETE COMPARISON:  None Available. FINDINGS: Right Kidney: Renal measurements: 10.9 x 5.2 x 5.3 cm = volume: 156 mL. Increased cortical echogenicity. Mild cortical atrophy. No hydronephrosis. Left Kidney: Renal measurements: 13.3 x 5.9 x 5.5 cm =  volume: 225 mL. Mildly increased cortical echogenicity. No hydronephrosis. Bladder: Appears normal for degree of bladder distention. Other: None. IMPRESSION: Increased cortical echogenicity of both kidneys consistent with chronic kidney disease. No evidence of hydronephrosis. Electronically Signed   By: Marcey Moan M.D.   On: 04/16/2024 14:09   DG Chest Portable 1 View Result Date: 04/15/2024 CLINICAL DATA:  PICC placement. EXAM: PORTABLE CHEST 1 VIEW COMPARISON:  April 14, 2024. FINDINGS: Stable cardiomediastinal silhouette. Right-sided PICC line is noted with distal tip in expected position of the SVC. Minimal bibasilar subsegmental atelectasis or scarring is noted with probable left basilar pleural thickening. New irregular nodular density seen in right upper lobe; CT scan of the chest is recommended for further evaluation. IMPRESSION: 1. Right-sided PICC line is noted with distal tip in expected position of the SVC. 2. New irregular nodular density seen in right upper lobe; CT scan of the chest is recommended for further evaluation. Electronically Signed   By: Lynwood Landy Raddle M.D.   On: 04/15/2024 17:10   US  EKG SITE RITE Result Date: 04/15/2024 If Site Rite image not attached, placement could not be confirmed due to current cardiac rhythm.  DG Chest Port 1 View Result Date: 04/14/2024 CLINICAL DATA:  Questionable sepsis. EXAM: PORTABLE CHEST 1 VIEW COMPARISON:  Chest radiograph dated 04/06/2024 FINDINGS: Shallow inspiration with bibasilar atelectasis. No consolidative changes. No pleural effusion pneumothorax. Stable cardiac silhouette. No acute osseous pathology. IMPRESSION: Shallow inspiration with bibasilar atelectasis. No focal consolidation. Electronically Signed   By: Vanetta Chou M.D.   On: 04/14/2024 17:30   CT ABDOMEN PELVIS WO CONTRAST Result Date: 04/14/2024 CLINICAL DATA:  Abdominal pain.  Hematemesis. EXAM: CT ABDOMEN AND PELVIS WITHOUT CONTRAST TECHNIQUE: Multidetector CT  imaging of the abdomen and pelvis was performed following the standard protocol without IV contrast. RADIATION DOSE REDUCTION: This exam was performed according to the departmental dose-optimization program which includes automated exposure control, adjustment of the mA and/or kV according to patient size and/or use of iterative reconstruction technique. COMPARISON:  Abdominal ultrasound dated 11/04/2021. FINDINGS: Evaluation of this exam is limited in the absence  of intravenous contrast. Lower chest: Bibasilar subpleural atelectasis/scarring. There is coronary vascular calcification. No intra-abdominal free air or free fluid. Hepatobiliary: The liver is unremarkable. No biliary dilatation. The gallbladder is unremarkable. Pancreas: Unremarkable. No pancreatic ductal dilatation or surrounding inflammatory changes. Spleen: Normal in size without focal abnormality. Adrenals/Urinary Tract: The adrenal glands unremarkable. There is no hydronephrosis or nephrolithiasis on either side. Bilateral perinephric stranding, nonspecific. Correlation with urinalysis recommended to exclude UTI. The visualized ureters and urinary bladder appear unremarkable. Stomach/Bowel: There is loose stool throughout the colon suggestive of diarrheal state. Correlation with clinical exam and stool cultures recommended. There is no bowel obstruction or active inflammation. Appendectomy. Vascular/Lymphatic: Moderate aortoiliac atherosclerotic disease. The IVC is unremarkable. No portal venous gas. There is no adenopathy. Reproductive: The prostate and seminal vesicles are grossly unremarkable. Other: Small fat containing bilateral inguinal hernias. Anterior pelvic wall hernia repair mesh. Musculoskeletal: Osteopenia with degenerative changes of the spine. Status post ORIF of right femoral neck fracture. There is edema in the surrounding soft tissues of the right hip. No fluid collection. IMPRESSION: 1. Diarrheal state. Correlation with clinical  exam and stool cultures recommended. No bowel obstruction. 2. No hydronephrosis or nephrolithiasis. 3.  Aortic Atherosclerosis (ICD10-I70.0). Electronically Signed   By: Vanetta Chou M.D.   On: 04/14/2024 17:29   DG HIP UNILAT WITH PELVIS 2-3 VIEWS RIGHT Result Date: 04/07/2024 CLINICAL DATA:  Postop internal fixation EXAM: DG HIP (WITH OR WITHOUT PELVIS) 2-3V RIGHT COMPARISON:  04/06/2024 FINDINGS: Changes of internal fixation across the right femoral intertrochanteric fracture. Anatomic alignment. No hardware complicating feature. IMPRESSION: Internal fixation.  No hardware complicating feature. Electronically Signed   By: Franky Crease M.D.   On: 04/07/2024 23:15   DG HIP UNILAT WITH PELVIS 2-3 VIEWS RIGHT Result Date: 04/07/2024 CLINICAL DATA:  Right hip internal fixation. EXAM: DG HIP (WITH OR WITHOUT PELVIS) 2-3V RIGHT COMPARISON:  04/06/2024 FINDINGS: Multiple intraoperative spot images demonstrate internal fixation across the right femoral intertrochanteric fracture. No hardware complicating feature. IMPRESSION: Internal fixation with anatomic alignment.  No complicating feature. Electronically Signed   By: Franky Crease M.D.   On: 04/07/2024 23:07   DG C-Arm 1-60 Min-No Report Result Date: 04/07/2024 Fluoroscopy was utilized by the requesting physician.  No radiographic interpretation.   DG Chest Port 1 View Result Date: 04/06/2024 CLINICAL DATA:  Pre-surgical. EXAM: RIGHT KNEE - 1-2 VIEW; PORTABLE CHEST - 1 VIEW COMPARISON:  09/08/2023, 04/06/2024. FINDINGS: Right knee: No acute fracture or dislocation is seen. There is a bony exostosis along the lateral aspect of the mid femur mild to moderate tricompartmental degenerative changes are noted at the knee. There is a trace joint effusion. Vascular calcifications are present in the soft tissues. Chest: The heart is enlarged and the mediastinal contour is within normal limits for supine positioning. Atherosclerotic calcification of the aorta is  noted. Lung volumes are low with atelectasis or infiltrate at the lung bases. There small bilateral pleural effusions. No pneumothorax is seen. No acute osseous abnormality. IMPRESSION: 1. No acute fracture or dislocation at the right knee. 2. Small bilateral pleural effusions with atelectasis or infiltrate at the lung bases. 3. Cardiomegaly. Electronically Signed   By: Leita Birmingham M.D.   On: 04/06/2024 17:29   DG Knee 1-2 Views Right Result Date: 04/06/2024 CLINICAL DATA:  Pre-surgical. EXAM: RIGHT KNEE - 1-2 VIEW; PORTABLE CHEST - 1 VIEW COMPARISON:  09/08/2023, 04/06/2024. FINDINGS: Right knee: No acute fracture or dislocation is seen. There is a bony exostosis along the lateral  aspect of the mid femur mild to moderate tricompartmental degenerative changes are noted at the knee. There is a trace joint effusion. Vascular calcifications are present in the soft tissues. Chest: The heart is enlarged and the mediastinal contour is within normal limits for supine positioning. Atherosclerotic calcification of the aorta is noted. Lung volumes are low with atelectasis or infiltrate at the lung bases. There small bilateral pleural effusions. No pneumothorax is seen. No acute osseous abnormality. IMPRESSION: 1. No acute fracture or dislocation at the right knee. 2. Small bilateral pleural effusions with atelectasis or infiltrate at the lung bases. 3. Cardiomegaly. Electronically Signed   By: Leita Birmingham M.D.   On: 04/06/2024 17:29   DG Hip Unilat W or Wo Pelvis 2-3 Views Right Result Date: 04/06/2024 CLINICAL DATA:  Fall with diffuse pain. EXAM: DG HIP (WITH OR WITHOUT PELVIS) 2-3V RIGHT COMPARISON:  None Available. FINDINGS: There is diffusely decreased mineralization of the bones. There is a nondisplaced intratrochanteric fracture of the proximal right femur. The remaining bony structures are intact and there is no dislocation. Degenerative changes are present at the bilateral hips, sacroiliac joints and lower  lumbar spine. Vascular calcifications are seen in the soft tissues. IMPRESSION: Minimally displaced intertrochanteric fracture of the proximal right femur. Electronically Signed   By: Leita Birmingham M.D.   On: 04/06/2024 16:22   CT Head Wo Contrast Result Date: 04/06/2024 CLINICAL DATA:  Fall with head injury. EXAM: CT HEAD WITHOUT CONTRAST CT CERVICAL SPINE WITHOUT CONTRAST TECHNIQUE: Multidetector CT imaging of the head and cervical spine was performed following the standard protocol without intravenous contrast. Multiplanar CT image reconstructions of the cervical spine were also generated. RADIATION DOSE REDUCTION: This exam was performed according to the departmental dose-optimization program which includes automated exposure control, adjustment of the mA and/or kV according to patient size and/or use of iterative reconstruction technique. COMPARISON:  CT cervical spine 07/21/2017. FINDINGS: CT HEAD FINDINGS Brain: Ventricles, cisterns and other CSF spaces are normal. Minimal chronic ischemic microvascular disease. No mass, mass effect, shift of midline structures or acute hemorrhage. No evidence of acute infarction. Vascular: No hyperdense vessel or unexpected calcification. Skull: Normal. Negative for fracture or focal lesion. Sinuses/Orbits: Orbits are normal. Minimal opacification over the right sphenoid sinus compatible mild chronic inflammatory change. Other: None. CT CERVICAL SPINE FINDINGS Alignment: Subtle anterior stairstep subluxations over the mid to lower cervical spine unchanged. No posttraumatic subluxation. Skull base and vertebrae: Vertebral body heights are normal. There is mild to moderate spondylosis throughout the cervical spine to include uncovertebral joint spurring and facet arthropathy. Mild to moderate left-sided neural foraminal narrowing at the C2-3 level. Mild bilateral neural foraminal narrowing at the C3-4 level and moderate right-sided neural foraminal narrowing at the C4-5  level. Minimal left-sided neural foraminal narrowing at the C5-6 level. No acute fracture. Prevertebral soft tissues are normal. Soft tissues and spinal canal: No spinal canal abnormality. Prevertebral soft tissues normal. Disc levels: Disc space narrowing at the C5-6 and C6-7 levels. Also disc space narrowing at the C7-T1 level. Upper chest: No acute findings. Other: Oval mass over the posterior aspect of the right parotid gland measuring 1.2 cm in short axis with significant enlargement compared to the prior exam. IMPRESSION: 1. No acute brain injury. 2. Minimal chronic ischemic microvascular disease. 3. No acute cervical spine injury. 4. Mild to moderate spondylosis throughout the cervical spine with disc disease at the C5-6, C6-7 and C7-T1 levels. Multilevel neural foraminal narrowing as described. 5. 1.2 cm oval mass  over the posterior aspect of the right parotid gland with significant enlargement compared to the prior exam. This may represent an enlarged lymph node versus a primary parotid neoplasm. Consider MRI with without contrast for further evaluation. Electronically Signed   By: Toribio Agreste M.D.   On: 04/06/2024 16:15   CT Cervical Spine Wo Contrast Result Date: 04/06/2024 CLINICAL DATA:  Fall with head injury. EXAM: CT HEAD WITHOUT CONTRAST CT CERVICAL SPINE WITHOUT CONTRAST TECHNIQUE: Multidetector CT imaging of the head and cervical spine was performed following the standard protocol without intravenous contrast. Multiplanar CT image reconstructions of the cervical spine were also generated. RADIATION DOSE REDUCTION: This exam was performed according to the departmental dose-optimization program which includes automated exposure control, adjustment of the mA and/or kV according to patient size and/or use of iterative reconstruction technique. COMPARISON:  CT cervical spine 07/21/2017. FINDINGS: CT HEAD FINDINGS Brain: Ventricles, cisterns and other CSF spaces are normal. Minimal chronic ischemic  microvascular disease. No mass, mass effect, shift of midline structures or acute hemorrhage. No evidence of acute infarction. Vascular: No hyperdense vessel or unexpected calcification. Skull: Normal. Negative for fracture or focal lesion. Sinuses/Orbits: Orbits are normal. Minimal opacification over the right sphenoid sinus compatible mild chronic inflammatory change. Other: None. CT CERVICAL SPINE FINDINGS Alignment: Subtle anterior stairstep subluxations over the mid to lower cervical spine unchanged. No posttraumatic subluxation. Skull base and vertebrae: Vertebral body heights are normal. There is mild to moderate spondylosis throughout the cervical spine to include uncovertebral joint spurring and facet arthropathy. Mild to moderate left-sided neural foraminal narrowing at the C2-3 level. Mild bilateral neural foraminal narrowing at the C3-4 level and moderate right-sided neural foraminal narrowing at the C4-5 level. Minimal left-sided neural foraminal narrowing at the C5-6 level. No acute fracture. Prevertebral soft tissues are normal. Soft tissues and spinal canal: No spinal canal abnormality. Prevertebral soft tissues normal. Disc levels: Disc space narrowing at the C5-6 and C6-7 levels. Also disc space narrowing at the C7-T1 level. Upper chest: No acute findings. Other: Oval mass over the posterior aspect of the right parotid gland measuring 1.2 cm in short axis with significant enlargement compared to the prior exam. IMPRESSION: 1. No acute brain injury. 2. Minimal chronic ischemic microvascular disease. 3. No acute cervical spine injury. 4. Mild to moderate spondylosis throughout the cervical spine with disc disease at the C5-6, C6-7 and C7-T1 levels. Multilevel neural foraminal narrowing as described. 5. 1.2 cm oval mass over the posterior aspect of the right parotid gland with significant enlargement compared to the prior exam. This may represent an enlarged lymph node versus a primary parotid  neoplasm. Consider MRI with without contrast for further evaluation. Electronically Signed   By: Toribio Agreste M.D.   On: 04/06/2024 16:15    Assessment  89 y.o. male with a history of CAD, HLD, ILD, chronic heart failure, polymyalgia rheumatica and RA chronic 6 mg of prednisone  daily and chronic CellCept , pulmonary fibrosis, RBBB, mechanical fall with right intertrochanteric femur fracture s/p ORIF with intramedullary rod 04/07/2024 at [facility] which required 1 unit PRBCs during hospitalization for ABLA and was discharged home 9/27 at family request instead of SNF who Eilene presented to [facility] ED with acute episodes of hematemesis and melena with hemoglobin found to be 6.3.  GI consulted due to acute GI bleed.  Acute on chronic anemia: Patient presented with melena/hematemesis with reports of 2 episodes of hematemesis with clots PTA and melena that was present prior to onset of hematemesis.  Hemoglobin  7.2 on discharge from [facility] a few days prior to admission and presented with a hemoglobin of 6.3.  Acute blood loss likely secondary to hip wound as well as oozing from bleeding ulcers.  He has been previously evaluated by hematology for chronic macrocytic anemia and fluctuating thrombocytopenia with plans for bone marrow biopsy if he presented with any worsening anemia.  His baseline hemoglobin prior to hip fracture and ORIF was 10-12 range.  Under chronic treatment with prednisone , CellCept , and 81 mg aspirin  twice daily given his recent fall and his polymyalgia rheumatica.  CT without any evidence of cirrhosis.  Given his bleeding and low hemoglobin he was found to be in hemorrhagic shock with worsening renal function with evidence of renal failure given associated hyperkalemia, increased lactic acid and requiring additional blood products.  This admission he has received a total of 5 units PRBC.  Per patient he reported his last colonoscopy was about 5 years ago with Dr. Alferd and states it was clear.   EGD  04/15/2024 with 3 nonbleeding gastric ulcers (largest 20 mm) with a flat pigmented spot, Forrest class IIc s/p injection and treated with bipolar cautery and was biopsied (slight chronic inflammation with focal intestinal metaplasia, negative H. Pylori).  Had a dark red/maroon-colored stool yesterday morning but no further bleeding yesterday afternoon, overnight, or this morning.  Given bleeding yesterday, CTA GI bleeding scan was performed noting hyperdense enteric material contrast within the cecum and ascending colon which limited evaluation for GI bleed in this region.  He was given clear liquid diet yesterday made n.p.o. at midnight in case further bleeding occurred in preparation for possible repeat upper endoscopy.  Given no further bleeding overnight and he does not have any pain or any worsening anemia, given hemoglobin is stable at 8 we will hold off on repeat upper endoscopy today.  Will continue to trend H/H and treat ulcers with PPI twice daily and Carafate.  Will continue to recommend repeat upper endoscopy in 2 months.   Leukocytosis: WBC count had been up to 30 and now improved to 11.9 today. Blood cultures with NGTD and he has remained afebrile with repeat BC drawn yesterday remain pending.  CT A/P without contrast noting diarrheal state but no active inflammation; nursing staff denying diarrhea, patient also denies. CT chest abnormal with 2 suspicious lesions noted in the lungs. Management per attending.    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      Plan / Recommendations  Trend H/H, transfuse for Hgb <8 Avoid NSAIDs PPI IV BID for total 72 hours then may switch to P.O.  Carafate 1g QID for 8 weeks. Repeat EGD in 2 months outpatient Clear liquid diet today and advance as tolerated as long as he continues to have no signs of rectal bleeding or melena.     LOS: 4 days   04/18/2024, 9:35 AM   Charmaine Melia, MSN, FNP-BC,  AGACNP-BC [facility]

## 2024-04-18 NOTE — Progress Notes (Signed)
 PROGRESS NOTE  Jeffrey Stewart FMW:[id number] DOB: 1934/12/05 DOA: 04/14/2024 PCP: Anita Bernardino [name], FNP  Brief History:  89 y.o. male with medical history significant for dCHF, interstitial lung disease, RBBB, rheumatoid arthritis, CAD. Patient was brought to the ED via EMS with reports of vomiting of blood, and blood in his stools that started this morning.  Spouse is at bedside and confirms that patients vomitus contained golf sized blood clots.  He is not aware of having any black stools.  Reports some lower abdominal pain earlier today that has since resolved.  He has history of hemorrhoids which occasionally bleed, but not to this extent, he has never vomited blood.  He denies NSAID use.  EMS reports blood pressure transiently down to 80s and route.   Recently hospitalized 9/29 to 9/27 for hip fracture-underwent ORIF 03/2021.  Also with acute blood loss anemia postprocedure, requiring 1 unit PRBC.  Hemoglobin on discharge was 7.1.  Refused SNF and was discharged to home.  He is not on full dose anticoagulation.   ED Course: Temperature 98.2.  Tachycardic heart rate 94-116.  Respiratory rate 18- 35.  Blood pressure systolic 94-115.  O2 sat greater than 92% on room air. Hemoglobin 6.3.  WBC 28.2.  Lactic acid 3.9.  CTAP W OC-diarrheal state, correlation with clinical exam and stool cultures recommended.  EDP talked to Dr. Cindie, n.p.o., IV PPI twice daily, plan for EGD 9/30.   Assessment/Plan: Hemorrhagic Shock Acute blood loss anemia Multiple gastric ulcers  -- pt had blood BMs/hematemesis, transferred to ICU started on pressor support for hypotension -- pt had another blood BM 04/17/24 AM>>no more since then -- 5 units PRBC transfused this admisson -- he remains on levophed 1-2 mcg/kg/min -- 9/30 EGD--3x nonbleeding cratered gastric ulcers injected with epi and bipolar cautery -- continue pantoprazole 40 mg IV BID -- sucralfate suspension QID for 8 weeks -- avoid all NSAIDS--pt  was on ASA 81 mg bid PTA -- 10/2 CT GI bleed--Hyperdense enteric material or contrast within the cecum and ascending colon, Otherwise, no extravasation of contrast in the remainder of the GI tract to suggest active GI bleeding. -- wean IV norepinephrine - add midodrine, already on stress steroids -- restarted clears 10/3   Acute on CKD stage 3a Lactic acidosis Hyperkalemia -- treated with IV dextrose  and insulin  -- down to 5.2 -- given lokelma 10 g 2 doses -- baseline creatinine 0.9-1.2 -- initially fluid resuscitated -- renal US --negative hydronephrosis -- lactic acidosis trending down with IV fluid and improving low blood pressure -- overall improved   Leukemoid reaction -- multifactorial including steroids, stress demargination, transfusions --9/29 and 10/2 blood cultures neg -- 9/29 UA--no pyuria --overall improved   New irregular nodular density seen in right upper lobe  -- 10/1 CT chest--irregular 1.0x 2.2 cm RUL opacity, 5x18mm LLL nodule -- PCT 0.79 -- treat empirically with abx as aspiration/PNA -- outpt follow up with pulmonary and surveillance CT -- pt and family informed of plan    Glaucoma -- resumed home eye drops   Chronic HFpEF -2/923 Echo EF 50%, G1DD, mod decrease RVF -holding torsemide  temporarily   Right hip fracture- 9/29 to 9/27 for hip fracture-underwent ORIF 03/2021. Right thigh has extensive ecchymosis, likely contributed to the anemia. - Hold aspirin  and all anticoagulation    Stage I decubitus ulcer -- skin care protocol   Rheumatoid arthritis/interstitial lung disease- On chronic prednisone  5 mg a.m. and 1 mg  p.m. and mycophenolate . - Resume mycophenolate  - remains on stress dose steroids        Family Communication:   spouse/DTR at bedside 10/3   Consultants:  GI   Code Status:  FULL   DVT Prophylaxis:  SCDs     Procedures: As Listed in Progress Note Above   Antibiotics: Cefepime 10/2>> Doxy 10/2>>             Subjective: No further bloody BMs.  Denies cp, sob, n/v/d, abd pain.  No f/c.  Objective: Vitals:   04/18/24 1600 04/18/24 1615 04/18/24 1630 04/18/24 1645  BP: (!) 105/41 (!) 102/44 (!) 102/50 (!) 127/50  Pulse: 75 73 74 85  Resp: (!) 22 (!) 22 (!) 24 15  Temp:      TempSrc:      SpO2: 96% 98% 97% 91%  Weight:      Height:        Intake/Output Summary (Last 24 hours) at 04/18/2024 1658 Last data filed at 04/18/2024 1242 Gross per 24 hour  Intake 1083.03 ml  Output 925 ml  Net 158.03 ml   Weight change:  Exam:  General:  Pt is alert, follows commands appropriately, not in acute distress HEENT: No icterus, No thrush, No neck mass, [place]/AT Cardiovascular: RRR, S1/S2, no rubs, no gallops Respiratory: CTA bilaterally, no wheezing, no crackles, no rhonchi Abdomen: Soft/+BS, non tender, non distended, no guarding Extremities: 1 + LE edema, No lymphangitis, No petechiae, No rashes, no synovitis   Data Reviewed: I have personally reviewed following labs and imaging studies Basic Metabolic Panel: Recent Labs  Lab 04/15/24 0339 04/15/24 1154 04/16/24 0506 04/17/24 0435 04/18/24 0521  NA 138 140 139 140 139  K 5.7* 5.2* 4.4 3.9 3.9  CL 104 107 108 108 111  CO2 20* 21* 20* 23 21*  GLUCOSE 212* 175* 162* 173* 142*  BUN 87* 87* 81* 59* 42*  CREATININE 2.16* 2.06* 1.80* 1.29* 1.02  CALCIUM  7.4* 7.6* 7.4* 7.7* 7.8*  MG 2.4  --   --  2.6*  --   PHOS 4.7*  --   --   --   --    Liver Function Tests: Recent Labs  Lab 04/14/24 1422  AST 26  ALT 13  ALKPHOS 42  BILITOT 1.7*  PROT 4.7*  ALBUMIN  2.6*   Recent Labs  Lab 04/14/24 1422  LIPASE 30   No results for input(s): AMMONIA in the last 168 hours. Coagulation Profile: Recent Labs  Lab 04/14/24 1422  INR 1.1   CBC: Recent Labs  Lab 04/14/24 1422 04/14/24 2040 04/15/24 0339 04/15/24 1154 04/16/24 0506 04/16/24 1214 04/17/24 0435 04/17/24 1418 04/18/24 0521  WBC 28.2*  --  30.0*  25.1* 17.7*  --  13.9*  --  11.9*  NEUTROABS 25.9*  --   --  19.1* 13.6*  --  10.4*  --  8.4*  HGB 6.3*   < > 6.4* 9.5* 7.8* 7.7* 7.5* 8.1* 8.0*  HCT 19.7*   < > 20.1* 28.5* 24.6* 24.4* 23.6* 24.9* 24.9*  MCV 113.2*  --  105.2* 95.6 98.4  --  99.6  --  100.0  PLT 274  --  291 239 188  --  192  --  171   < > = values in this interval not displayed.   Cardiac Enzymes: No results for input(s): CKTOTAL, CKMB, CKMBINDEX, TROPONINI in the last 168 hours. BNP: Invalid input(s): POCBNP CBG: Recent Labs  Lab 04/14/24 2210 04/15/24 0541 04/15/24 1342 04/17/24  1614  GLUCAP 175* 221* 154* 145*   HbA1C: No results for input(s): HGBA1C in the last 72 hours. Urine analysis:    Component Value Date/Time   COLORURINE YELLOW 04/14/2024 2215   APPEARANCEUR HAZY (A) 04/14/2024 2215   LABSPEC 1.020 04/14/2024 2215   PHURINE 5.0 04/14/2024 2215   GLUCOSEU NEGATIVE 04/14/2024 2215   HGBUR NEGATIVE 04/14/2024 2215   BILIRUBINUR NEGATIVE 04/14/2024 2215   KETONESUR NEGATIVE 04/14/2024 2215   PROTEINUR NEGATIVE 04/14/2024 2215   UROBILINOGEN 1.0 09/24/2013 0905   NITRITE NEGATIVE 04/14/2024 2215   LEUKOCYTESUR NEGATIVE 04/14/2024 2215   Sepsis Labs: @LABRCNTIP (procalcitonin:4,lacticidven:4) ) Recent Results (from the past 240 hours)  Resp panel by RT-PCR (RSV, Flu A&B, Covid) Anterior Nasal Swab     Status: None   Collection Time: 04/14/24  2:03 PM   Specimen: Anterior Nasal Swab  Result Value Ref Range Status   SARS Coronavirus 2 by RT PCR NEGATIVE NEGATIVE Final    Comment: (NOTE) SARS-CoV-2 target nucleic acids are NOT DETECTED.  The SARS-CoV-2 RNA is generally detectable in upper respiratory specimens during the acute phase of infection. The lowest concentration of SARS-CoV-2 viral copies this assay can detect is 138 copies/mL. A negative result does not preclude SARS-Cov-2 infection and should not be used as the sole basis for treatment or other patient management  decisions. A negative result may occur with  improper specimen collection/handling, submission of specimen other than nasopharyngeal swab, presence of viral mutation(s) within the areas targeted by this assay, and inadequate number of viral copies(<138 copies/mL). A negative result must be combined with clinical observations, patient history, and epidemiological information. The expected result is Negative.  Fact Sheet for Patients:  BloggerCourse.com  Fact Sheet for Healthcare Providers:  SeriousBroker.it  This test is no t yet approved or cleared by the [place]  FDA and  has been authorized for detection and/or diagnosis of SARS-CoV-2 by FDA under an Emergency Use Authorization (EUA). This EUA will remain  in effect (meaning this test can be used) for the duration of the COVID-19 declaration under Section 564(b)(1) of the Act, 21 U.S.C.section 360bbb-3(b)(1), unless the authorization is terminated  or revoked sooner.       Influenza A by PCR NEGATIVE NEGATIVE Final   Influenza B by PCR NEGATIVE NEGATIVE Final    Comment: (NOTE) The Xpert Xpress SARS-CoV-2/FLU/RSV plus assay is intended as an aid in the diagnosis of influenza from Nasopharyngeal swab specimens and should not be used as a sole basis for treatment. Nasal washings and aspirates are unacceptable for Xpert Xpress SARS-CoV-2/FLU/RSV testing.  Fact Sheet for Patients: BloggerCourse.com  Fact Sheet for Healthcare Providers: SeriousBroker.it  This test is not yet approved or cleared by the [place]  FDA and has been authorized for detection and/or diagnosis of SARS-CoV-2 by FDA under an Emergency Use Authorization (EUA). This EUA will remain in effect (meaning this test can be used) for the duration of the COVID-19 declaration under Section 564(b)(1) of the Act, 21 U.S.C. section 360bbb-3(b)(1), unless the  authorization is terminated or revoked.     Resp Syncytial Virus by PCR NEGATIVE NEGATIVE Final    Comment: (NOTE) Fact Sheet for Patients: BloggerCourse.com  Fact Sheet for Healthcare Providers: SeriousBroker.it  This test is not yet approved or cleared by the [place]  FDA and has been authorized for detection and/or diagnosis of SARS-CoV-2 by FDA under an Emergency Use Authorization (EUA). This EUA will remain in effect (meaning this test can be used) for the duration of  the COVID-19 declaration under Section 564(b)(1) of the Act, 21 U.S.C. section 360bbb-3(b)(1), unless the authorization is terminated or revoked.  Performed at [facility], [address], [place], [place] [address]   Culture, blood (Routine X 2) w Reflex to ID Panel     Status: None (Preliminary result)   Collection Time: 04/14/24  9:26 PM   Specimen: BLOOD  Result Value Ref Range Status   Specimen Description BLOOD BLOOD RIGHT HAND  Final   Special Requests NONE  Final   Culture   Final    NO GROWTH 4 DAYS Performed at [facility], [address], [place], [place] [address]    Report Status PENDING  Incomplete  Culture, blood (Routine X 2) w Reflex to ID Panel     Status: None (Preliminary result)   Collection Time: 04/14/24  9:26 PM   Specimen: BLOOD  Result Value Ref Range Status   Specimen Description BLOOD BLOOD LEFT HAND  Final   Special Requests NONE  Final   Culture   Final    NO GROWTH 4 DAYS Performed at [facility], [address], [place], [place] [address]    Report Status PENDING  Incomplete  MRSA Next Gen by PCR, Nasal     Status: Abnormal   Collection Time: 04/14/24  9:39 PM   Specimen: Nasal Mucosa; Nasal Swab  Result Value Ref Range Status   MRSA by PCR Next Gen DETECTED (A) NOT DETECTED Final    Comment: RESULT CALLED TO, READ BACK BY AND VERIFIED WITH: R ESOCE,RN@0014  04/15/24 MK (NOTE) The GeneXpert MRSA Assay (FDA  approved for NASAL specimens only), is one component of a comprehensive MRSA colonization surveillance program. It is not intended to diagnose MRSA infection nor to guide or monitor treatment for MRSA infections. Test performance is not FDA approved in patients less than 49 years old. Performed at [facility], [address] [place], [place] [address]   Culture, blood (Routine X 2) w Reflex to ID Panel     Status: None (Preliminary result)   Collection Time: 04/17/24  9:52 AM   Specimen: BLOOD  Result Value Ref Range Status   Specimen Description BLOOD BLOOD LEFT ARM  Final   Special Requests   Final    BOTTLES DRAWN AEROBIC AND ANAEROBIC Blood Culture adequate volume   Culture   Final    NO GROWTH < 24 HOURS Performed at [facility], [address], [place], [place] [address]    Report Status PENDING  Incomplete  Culture, blood (Routine X 2) w Reflex to ID Panel     Status: None (Preliminary result)   Collection Time: 04/17/24  9:52 AM   Specimen: BLOOD  Result Value Ref Range Status   Specimen Description BLOOD BLOOD RIGHT HAND AEROBIC BOTTLE ONLY  Final   Special Requests   Final    Blood Culture results may not be optimal due to an inadequate volume of blood received in culture bottles   Culture   Final    NO GROWTH < 24 HOURS Performed at [facility], [address], [place], [place] [address]    Report Status PENDING  Incomplete     Scheduled Meds:  sodium chloride    Intravenous Once   sodium chloride    Intravenous Once   sodium chloride    Intravenous Once   sodium chloride    Intravenous Once   atorvastatin   40 mg Oral QHS   brimonidine   1 drop Both Eyes BID   Chlorhexidine  Gluconate  Cloth  6 each Topical Q0600   Chlorhexidine  Gluconate Cloth  6 each Topical Daily   fluticasone  furoate-vilanterol  1 puff Inhalation Daily   hydrocortisone  sod succinate (SOLU-CORTEF ) inj  40 mg Intravenous Q12H   latanoprost   1 drop Both Eyes QHS   midodrine  5 mg Oral TID WC    mupirocin  ointment  1 Application Nasal BID   mycophenolate   1,000 mg Oral BID   omega-3 acid ethyl esters  1 g Oral BID   pantoprazole  40 mg Intravenous Q12H   sodium chloride  flush  10-40 mL Intracatheter Q12H   sucralfate  1 g Oral TID WC & HS   Continuous Infusions:  ceFEPime (MAXIPIME) IV Stopped (04/18/24 9078)   doxycycline (VIBRAMYCIN) IV 125 mL/hr at 04/18/24 1033   norepinephrine (LEVOPHED) Adult infusion 2 mcg/min (04/18/24 1033)    Procedures/Studies: CT ANGIO GI BLEED Result Date: 04/17/2024 CLINICAL DATA:  persistent hematochezia, blood loss anemia EXAM: CTA ABDOMEN AND PELVIS WITHOUT AND WITH CONTRAST TECHNIQUE: Initially, noncontrast CT of the abdomen and pelvis were performed. Subsequently, Multidetector CT imaging of the abdomen and pelvis was performed using the standard protocol during bolus administration of intravenous contrast. Multiplanar reconstructed images and MIPs were obtained and reviewed to evaluate the vascular anatomy. RADIATION DOSE REDUCTION: This exam was performed according to the departmental dose-optimization program which includes automated exposure control, adjustment of the mA and/or kV according to patient size and/or use of iterative reconstruction technique. CONTRAST:  80mL OMNIPAQUE  IOHEXOL  350 MG/ML SOLN COMPARISON:  04/14/2024 FINDINGS: VASCULAR Aorta: No aortic aneurysm or dissection. Diffuse aortic atherosclerosis. No hemodynamically significant stenosis. Celiac: Patent without acute thrombus, aneurysm, or dissection.No hemodynamically significant stenosis. SMA: Patent without acute thrombus, aneurysm, or dissection.No hemodynamically significant stenosis. Renals: Patent without acute thrombus, aneurysm, or dissection.Mild stenosis of both renal artery ostia from calcified plaque. IMA: Patent without acute thrombus, aneurysm, or dissection.Moderate narrowing of the IMA ostium from calcified plaque. Inflow: Patent without acute thrombus, aneurysm, or  dissection.Diffuse calcified atherosclerosis throughout the inflow vessels without hemodynamically significant stenosis. Proximal Outflow: The bilateral common femoral and visualized portions of the superficial and profunda femoral arteries are patent without acute thrombus, aneurysm, or dissection.No hemodynamically significant stenosis. Veins: No obvious venous abnormality within the limitations of this arterial phase study. Review of the MIP images confirms the above findings. NON-VASCULAR Lower chest: No focal airspace consolidation or pleural effusion.Posterior bibasilar dependent atelectasis. Multifocal scarring also present in the lung bases. Hepatobiliary: No mass.No radiopaque stones or wall thickening of the gallbladder.No intrahepatic or extrahepatic biliary ductal dilation. Pancreas: No mass or main ductal dilation.No peripancreatic inflammation or fluid collection. Spleen: Normal size. No mass. Adrenals/Urinary Tract: No adrenal masses. No renal mass. No hydronephrosis or nephrolithiasis. The urinary bladder is distended without focal abnormality. Stomach/Bowel: The stomach is decompressed without focal abnormality. No small bowel wall thickening or inflammation. No small bowel obstruction.The appendix was not visualized. No right lower quadrant or pericecal inflammatory changes to suggest acute appendicitis. Hyperdense enteric material or contrast within the cecum and ascending colon. Scattered colonic diverticulosis. GI Bleed: No extravasation of contrast to suggest active GI bleeding. Lymphatic: No intraabdominal or pelvic lymphadenopathy. Reproductive: No prostatomegaly.No free pelvic fluid. Other: No pneumoperitoneum, ascites, or mesenteric inflammation. Bilateral inguinal hernia repairs. Musculoskeletal: No acute fracture or destructive lesion.Partially visualized right femoral intramedullary nail with persistent fracture of the right intertrochanteric femoral neck. Likely postsurgical edema along  the right hip. Multilevel degenerative disc disease of the spine. IMPRESSION: VASCULAR 1. No aortic  aneurysm, intramural hematoma, or aortic dissection. 2. Hyperdense enteric material or contrast within the cecum and ascending colon, which limits evaluation for GI bleed in this region. Otherwise, no extravasation of contrast in the remainder of the GI tract to suggest active GI bleeding. NON-VASCULAR No additional, acute findings within the abdomen or pelvis. Aortic Atherosclerosis (ICD10-I70.0). Electronically Signed   By: Rogelia Myers M.D.   On: 04/17/2024 09:58   CT CHEST WO CONTRAST Result Date: 04/16/2024 CLINICAL DATA:  Respiratory illness, nondiagnostic xray RUL opacity on CXR. EXAM: CT CHEST WITHOUT CONTRAST TECHNIQUE: Multidetector CT imaging of the chest was performed following the standard protocol without IV contrast. RADIATION DOSE REDUCTION: This exam was performed according to the departmental dose-optimization program which includes automated exposure control, adjustment of the mA and/or kV according to patient size and/or use of iterative reconstruction technique. COMPARISON:  Chest radiograph from 04/15/2024. FINDINGS: Cardiovascular: Normal cardiac size. No pericardial effusion. No aortic aneurysm. There are coronary artery calcifications, in keeping with coronary artery disease. There are also moderate peripheral atherosclerotic vascular calcifications of thoracic aorta and its major branches. Aberrant origin of right subclavian artery noted, which arises distal to the left subclavian artery and courses towards the right side, posterior to the esophagus. Mediastinum/Nodes: Visualized thyroid gland appears grossly unremarkable. No solid / cystic mediastinal masses. The esophagus is nondistended precluding optimal assessment. There are few mildly prominent mediastinal lymph nodes, which do not meet the size criteria for lymphadenopathy and appear grossly similar to the prior study, favoring  benign etiology. No axillary lymphadenopathy by size criteria. Evaluation of bilateral hila is limited due to lack on intravenous contrast: however, no large hilar lymphadenopathy identified. Lungs/Pleura: The central tracheo-bronchial tree is patent. There is a new irregular solid noncalcified 1.0 x 2.2 cm opacity in the subpleural right upper lobe. There are several additional linear areas of scarring/atelectasis throughout bilateral lungs, which are grossly similar to the prior study. No lung collapse, pleural effusion or pneumothorax. No pulmonary edema. There is a 5 x 5 mm ground-glass nodule in the left lung lower lobe, which is also new since the prior study. Upper Abdomen: There is small sliding hiatal hernia. Visualized upper abdominal viscera within normal limits. Musculoskeletal: Right-sided PICC line noted with its tip in the cavo-atrial junction region. Visualized soft tissues of the chest wall are otherwise grossly unremarkable. No suspicious osseous lesions. There are mild multilevel degenerative changes in the visualized spine. IMPRESSION: 1. There is a new irregular solid noncalcified 1.0 x 2.2 cm opacity in the subpleural right upper lobe. This opacity corresponds to the density described on the recent chest radiograph. This is suspicious. Pulmonary consultation is recommended. 2. There is also a new 5 x 5 mm ground-glass nodule in the left lung lower lobe. This is also indeterminate and differential diagnosis includes infectious/inflammatory etiology versus neoplastic process. 3. Multiple other nonacute observations, as described above. Aortic Atherosclerosis (ICD10-I70.0). Electronically Signed   By: Ree Molt M.D.   On: 04/16/2024 16:44   US  RENAL Result Date: 04/16/2024 CLINICAL DATA:  Acute kidney injury. EXAM: RENAL / URINARY TRACT ULTRASOUND COMPLETE COMPARISON:  None Available. FINDINGS: Right Kidney: Renal measurements: 10.9 x 5.2 x 5.3 cm = volume: 156 mL. Increased cortical  echogenicity. Mild cortical atrophy. No hydronephrosis. Left Kidney: Renal measurements: 13.3 x 5.9 x 5.5 cm = volume: 225 mL. Mildly increased cortical echogenicity. No hydronephrosis. Bladder: Appears normal for degree of bladder distention. Other: None. IMPRESSION: Increased cortical echogenicity of both kidneys consistent with chronic  kidney disease. No evidence of hydronephrosis. Electronically Signed   By: Marcey Moan M.D.   On: 04/16/2024 14:09   DG Chest Portable 1 View Result Date: 04/15/2024 CLINICAL DATA:  PICC placement. EXAM: PORTABLE CHEST 1 VIEW COMPARISON:  April 14, 2024. FINDINGS: Stable cardiomediastinal silhouette. Right-sided PICC line is noted with distal tip in expected position of the SVC. Minimal bibasilar subsegmental atelectasis or scarring is noted with probable left basilar pleural thickening. New irregular nodular density seen in right upper lobe; CT scan of the chest is recommended for further evaluation. IMPRESSION: 1. Right-sided PICC line is noted with distal tip in expected position of the SVC. 2. New irregular nodular density seen in right upper lobe; CT scan of the chest is recommended for further evaluation. Electronically Signed   By: Lynwood Landy Raddle M.D.   On: 04/15/2024 17:10   US  EKG SITE RITE Result Date: 04/15/2024 If Site Rite image not attached, placement could not be confirmed due to current cardiac rhythm.  DG Chest Port 1 View Result Date: 04/14/2024 CLINICAL DATA:  Questionable sepsis. EXAM: PORTABLE CHEST 1 VIEW COMPARISON:  Chest radiograph dated 04/06/2024 FINDINGS: Shallow inspiration with bibasilar atelectasis. No consolidative changes. No pleural effusion pneumothorax. Stable cardiac silhouette. No acute osseous pathology. IMPRESSION: Shallow inspiration with bibasilar atelectasis. No focal consolidation. Electronically Signed   By: Vanetta Chou M.D.   On: 04/14/2024 17:30   CT ABDOMEN PELVIS WO CONTRAST Result Date: 04/14/2024 CLINICAL  DATA:  Abdominal pain.  Hematemesis. EXAM: CT ABDOMEN AND PELVIS WITHOUT CONTRAST TECHNIQUE: Multidetector CT imaging of the abdomen and pelvis was performed following the standard protocol without IV contrast. RADIATION DOSE REDUCTION: This exam was performed according to the departmental dose-optimization program which includes automated exposure control, adjustment of the mA and/or kV according to patient size and/or use of iterative reconstruction technique. COMPARISON:  Abdominal ultrasound dated 11/04/2021. FINDINGS: Evaluation of this exam is limited in the absence of intravenous contrast. Lower chest: Bibasilar subpleural atelectasis/scarring. There is coronary vascular calcification. No intra-abdominal free air or free fluid. Hepatobiliary: The liver is unremarkable. No biliary dilatation. The gallbladder is unremarkable. Pancreas: Unremarkable. No pancreatic ductal dilatation or surrounding inflammatory changes. Spleen: Normal in size without focal abnormality. Adrenals/Urinary Tract: The adrenal glands unremarkable. There is no hydronephrosis or nephrolithiasis on either side. Bilateral perinephric stranding, nonspecific. Correlation with urinalysis recommended to exclude UTI. The visualized ureters and urinary bladder appear unremarkable. Stomach/Bowel: There is loose stool throughout the colon suggestive of diarrheal state. Correlation with clinical exam and stool cultures recommended. There is no bowel obstruction or active inflammation. Appendectomy. Vascular/Lymphatic: Moderate aortoiliac atherosclerotic disease. The IVC is unremarkable. No portal venous gas. There is no adenopathy. Reproductive: The prostate and seminal vesicles are grossly unremarkable. Other: Small fat containing bilateral inguinal hernias. Anterior pelvic wall hernia repair mesh. Musculoskeletal: Osteopenia with degenerative changes of the spine. Status post ORIF of right femoral neck fracture. There is edema in the surrounding  soft tissues of the right hip. No fluid collection. IMPRESSION: 1. Diarrheal state. Correlation with clinical exam and stool cultures recommended. No bowel obstruction. 2. No hydronephrosis or nephrolithiasis. 3.  Aortic Atherosclerosis (ICD10-I70.0). Electronically Signed   By: Vanetta Chou M.D.   On: 04/14/2024 17:29   DG HIP UNILAT WITH PELVIS 2-3 VIEWS RIGHT Result Date: 04/07/2024 CLINICAL DATA:  Postop internal fixation EXAM: DG HIP (WITH OR WITHOUT PELVIS) 2-3V RIGHT COMPARISON:  04/06/2024 FINDINGS: Changes of internal fixation across the right femoral intertrochanteric fracture. Anatomic alignment. No  hardware complicating feature. IMPRESSION: Internal fixation.  No hardware complicating feature. Electronically Signed   By: Franky Crease M.D.   On: 04/07/2024 23:15   DG HIP UNILAT WITH PELVIS 2-3 VIEWS RIGHT Result Date: 04/07/2024 CLINICAL DATA:  Right hip internal fixation. EXAM: DG HIP (WITH OR WITHOUT PELVIS) 2-3V RIGHT COMPARISON:  04/06/2024 FINDINGS: Multiple intraoperative spot images demonstrate internal fixation across the right femoral intertrochanteric fracture. No hardware complicating feature. IMPRESSION: Internal fixation with anatomic alignment.  No complicating feature. Electronically Signed   By: Franky Crease M.D.   On: 04/07/2024 23:07   DG C-Arm 1-60 Min-No Report Result Date: 04/07/2024 Fluoroscopy was utilized by the requesting physician.  No radiographic interpretation.   DG Chest Port 1 View Result Date: 04/06/2024 CLINICAL DATA:  Pre-surgical. EXAM: RIGHT KNEE - 1-2 VIEW; PORTABLE CHEST - 1 VIEW COMPARISON:  09/08/2023, 04/06/2024. FINDINGS: Right knee: No acute fracture or dislocation is seen. There is a bony exostosis along the lateral aspect of the mid femur mild to moderate tricompartmental degenerative changes are noted at the knee. There is a trace joint effusion. Vascular calcifications are present in the soft tissues. Chest: The heart is enlarged and the  mediastinal contour is within normal limits for supine positioning. Atherosclerotic calcification of the aorta is noted. Lung volumes are low with atelectasis or infiltrate at the lung bases. There small bilateral pleural effusions. No pneumothorax is seen. No acute osseous abnormality. IMPRESSION: 1. No acute fracture or dislocation at the right knee. 2. Small bilateral pleural effusions with atelectasis or infiltrate at the lung bases. 3. Cardiomegaly. Electronically Signed   By: Leita Birmingham M.D.   On: 04/06/2024 17:29   DG Knee 1-2 Views Right Result Date: 04/06/2024 CLINICAL DATA:  Pre-surgical. EXAM: RIGHT KNEE - 1-2 VIEW; PORTABLE CHEST - 1 VIEW COMPARISON:  09/08/2023, 04/06/2024. FINDINGS: Right knee: No acute fracture or dislocation is seen. There is a bony exostosis along the lateral aspect of the mid femur mild to moderate tricompartmental degenerative changes are noted at the knee. There is a trace joint effusion. Vascular calcifications are present in the soft tissues. Chest: The heart is enlarged and the mediastinal contour is within normal limits for supine positioning. Atherosclerotic calcification of the aorta is noted. Lung volumes are low with atelectasis or infiltrate at the lung bases. There small bilateral pleural effusions. No pneumothorax is seen. No acute osseous abnormality. IMPRESSION: 1. No acute fracture or dislocation at the right knee. 2. Small bilateral pleural effusions with atelectasis or infiltrate at the lung bases. 3. Cardiomegaly. Electronically Signed   By: Leita Birmingham M.D.   On: 04/06/2024 17:29   DG Hip Unilat W or Wo Pelvis 2-3 Views Right Result Date: 04/06/2024 CLINICAL DATA:  Fall with diffuse pain. EXAM: DG HIP (WITH OR WITHOUT PELVIS) 2-3V RIGHT COMPARISON:  None Available. FINDINGS: There is diffusely decreased mineralization of the bones. There is a nondisplaced intratrochanteric fracture of the proximal right femur. The remaining bony structures are intact  and there is no dislocation. Degenerative changes are present at the bilateral hips, sacroiliac joints and lower lumbar spine. Vascular calcifications are seen in the soft tissues. IMPRESSION: Minimally displaced intertrochanteric fracture of the proximal right femur. Electronically Signed   By: Leita Birmingham M.D.   On: 04/06/2024 16:22   CT Head Wo Contrast Result Date: 04/06/2024 CLINICAL DATA:  Fall with head injury. EXAM: CT HEAD WITHOUT CONTRAST CT CERVICAL SPINE WITHOUT CONTRAST TECHNIQUE: Multidetector CT imaging of the head and cervical spine  was performed following the standard protocol without intravenous contrast. Multiplanar CT image reconstructions of the cervical spine were also generated. RADIATION DOSE REDUCTION: This exam was performed according to the departmental dose-optimization program which includes automated exposure control, adjustment of the mA and/or kV according to patient size and/or use of iterative reconstruction technique. COMPARISON:  CT cervical spine 07/21/2017. FINDINGS: CT HEAD FINDINGS Brain: Ventricles, cisterns and other CSF spaces are normal. Minimal chronic ischemic microvascular disease. No mass, mass effect, shift of midline structures or acute hemorrhage. No evidence of acute infarction. Vascular: No hyperdense vessel or unexpected calcification. Skull: Normal. Negative for fracture or focal lesion. Sinuses/Orbits: Orbits are normal. Minimal opacification over the right sphenoid sinus compatible mild chronic inflammatory change. Other: None. CT CERVICAL SPINE FINDINGS Alignment: Subtle anterior stairstep subluxations over the mid to lower cervical spine unchanged. No posttraumatic subluxation. Skull base and vertebrae: Vertebral body heights are normal. There is mild to moderate spondylosis throughout the cervical spine to include uncovertebral joint spurring and facet arthropathy. Mild to moderate left-sided neural foraminal narrowing at the C2-3 level. Mild bilateral  neural foraminal narrowing at the C3-4 level and moderate right-sided neural foraminal narrowing at the C4-5 level. Minimal left-sided neural foraminal narrowing at the C5-6 level. No acute fracture. Prevertebral soft tissues are normal. Soft tissues and spinal canal: No spinal canal abnormality. Prevertebral soft tissues normal. Disc levels: Disc space narrowing at the C5-6 and C6-7 levels. Also disc space narrowing at the C7-T1 level. Upper chest: No acute findings. Other: Oval mass over the posterior aspect of the right parotid gland measuring 1.2 cm in short axis with significant enlargement compared to the prior exam. IMPRESSION: 1. No acute brain injury. 2. Minimal chronic ischemic microvascular disease. 3. No acute cervical spine injury. 4. Mild to moderate spondylosis throughout the cervical spine with disc disease at the C5-6, C6-7 and C7-T1 levels. Multilevel neural foraminal narrowing as described. 5. 1.2 cm oval mass over the posterior aspect of the right parotid gland with significant enlargement compared to the prior exam. This may represent an enlarged lymph node versus a primary parotid neoplasm. Consider MRI with without contrast for further evaluation. Electronically Signed   By: Toribio Agreste M.D.   On: 04/06/2024 16:15   CT Cervical Spine Wo Contrast Result Date: 04/06/2024 CLINICAL DATA:  Fall with head injury. EXAM: CT HEAD WITHOUT CONTRAST CT CERVICAL SPINE WITHOUT CONTRAST TECHNIQUE: Multidetector CT imaging of the head and cervical spine was performed following the standard protocol without intravenous contrast. Multiplanar CT image reconstructions of the cervical spine were also generated. RADIATION DOSE REDUCTION: This exam was performed according to the departmental dose-optimization program which includes automated exposure control, adjustment of the mA and/or kV according to patient size and/or use of iterative reconstruction technique. COMPARISON:  CT cervical spine 07/21/2017.  FINDINGS: CT HEAD FINDINGS Brain: Ventricles, cisterns and other CSF spaces are normal. Minimal chronic ischemic microvascular disease. No mass, mass effect, shift of midline structures or acute hemorrhage. No evidence of acute infarction. Vascular: No hyperdense vessel or unexpected calcification. Skull: Normal. Negative for fracture or focal lesion. Sinuses/Orbits: Orbits are normal. Minimal opacification over the right sphenoid sinus compatible mild chronic inflammatory change. Other: None. CT CERVICAL SPINE FINDINGS Alignment: Subtle anterior stairstep subluxations over the mid to lower cervical spine unchanged. No posttraumatic subluxation. Skull base and vertebrae: Vertebral body heights are normal. There is mild to moderate spondylosis throughout the cervical spine to include uncovertebral joint spurring and facet arthropathy. Mild to moderate left-sided  neural foraminal narrowing at the C2-3 level. Mild bilateral neural foraminal narrowing at the C3-4 level and moderate right-sided neural foraminal narrowing at the C4-5 level. Minimal left-sided neural foraminal narrowing at the C5-6 level. No acute fracture. Prevertebral soft tissues are normal. Soft tissues and spinal canal: No spinal canal abnormality. Prevertebral soft tissues normal. Disc levels: Disc space narrowing at the C5-6 and C6-7 levels. Also disc space narrowing at the C7-T1 level. Upper chest: No acute findings. Other: Oval mass over the posterior aspect of the right parotid gland measuring 1.2 cm in short axis with significant enlargement compared to the prior exam. IMPRESSION: 1. No acute brain injury. 2. Minimal chronic ischemic microvascular disease. 3. No acute cervical spine injury. 4. Mild to moderate spondylosis throughout the cervical spine with disc disease at the C5-6, C6-7 and C7-T1 levels. Multilevel neural foraminal narrowing as described. 5. 1.2 cm oval mass over the posterior aspect of the right parotid gland with significant  enlargement compared to the prior exam. This may represent an enlarged lymph node versus a primary parotid neoplasm. Consider MRI with without contrast for further evaluation. Electronically Signed   By: Toribio Agreste M.D.   On: 04/06/2024 16:15    Alm Schneider, DO  [facility]  If 7PM-7AM, please contact night-coverage [email or web address] Password [email or web address] 04/18/2024, 4:58 PM   LOS: 4 days

## 2024-04-18 NOTE — Plan of Care (Signed)
  Problem: Activity: Goal: Risk for activity intolerance will decrease Outcome: Progressing   Problem: Nutrition: Goal: Adequate nutrition will be maintained Outcome: Progressing   Problem: Elimination: Goal: Will not experience complications related to urinary retention Outcome: Progressing   Problem: Skin Integrity: Goal: Risk for impaired skin integrity will decrease Outcome: Progressing   

## 2024-04-18 NOTE — Plan of Care (Signed)

## 2024-04-19 DIAGNOSIS — K270 Acute peptic ulcer, site unspecified, with hemorrhage: Secondary | ICD-10-CM

## 2024-04-19 DIAGNOSIS — R578 Other shock: Secondary | ICD-10-CM | POA: Diagnosis not present

## 2024-04-19 DIAGNOSIS — K922 Gastrointestinal hemorrhage, unspecified: Secondary | ICD-10-CM | POA: Diagnosis not present

## 2024-04-19 DIAGNOSIS — N179 Acute kidney failure, unspecified: Secondary | ICD-10-CM

## 2024-04-19 DIAGNOSIS — J849 Interstitial pulmonary disease, unspecified: Secondary | ICD-10-CM | POA: Diagnosis not present

## 2024-04-19 DIAGNOSIS — D649 Anemia, unspecified: Secondary | ICD-10-CM | POA: Diagnosis not present

## 2024-04-19 LAB — HEPATIC FUNCTION PANEL
ALT: 10 U/L (ref 0–44)
AST: 19 U/L (ref 15–41)
Albumin: 2.7 g/dL — ABNORMAL LOW (ref 3.5–5.0)
Alkaline Phosphatase: 55 U/L (ref 38–126)
Bilirubin, Direct: 0.6 mg/dL — ABNORMAL HIGH (ref 0.0–0.2)
Indirect Bilirubin: 0.6 mg/dL (ref 0.3–0.9)
Total Bilirubin: 1.2 mg/dL (ref 0.0–1.2)
Total Protein: 3.9 g/dL — ABNORMAL LOW (ref 6.5–8.1)

## 2024-04-19 LAB — BASIC METABOLIC PANEL WITH GFR
Anion gap: 7 (ref 5–15)
BUN: 32 mg/dL — ABNORMAL HIGH (ref 8–23)
CO2: 21 mmol/L — ABNORMAL LOW (ref 22–32)
Calcium: 7.9 mg/dL — ABNORMAL LOW (ref 8.9–10.3)
Chloride: 110 mmol/L (ref 98–111)
Creatinine, Ser: 0.98 mg/dL (ref 0.61–1.24)
GFR, Estimated: >60 mL/min (ref >60)
Glucose, Bld: 144 mg/dL — ABNORMAL HIGH (ref 70–99)
Potassium: 4 mmol/L (ref 3.5–5.1)
Sodium: 138 mmol/L (ref 135–145)

## 2024-04-19 LAB — CBC
HCT: 25.5 % — ABNORMAL LOW (ref 39.0–52.0)
Hemoglobin: 8.4 g/dL — ABNORMAL LOW (ref 13.0–17.0)
MCH: 32.8 pg (ref 26.0–34.0)
MCHC: 32.9 g/dL (ref 30.0–36.0)
MCV: 99.6 fL (ref 80.0–100.0)
Platelets: 190 K/uL (ref 150–400)
RBC: 2.56 MIL/uL — ABNORMAL LOW (ref 4.22–5.81)
RDW: 19.6 % — ABNORMAL HIGH (ref 11.5–15.5)
WBC: 18.4 K/uL — ABNORMAL HIGH (ref 4.0–10.5)
nRBC: 1.1 % — ABNORMAL HIGH (ref 0.0–0.2)

## 2024-04-19 LAB — CULTURE, BLOOD (ROUTINE X 2)
Culture: NO GROWTH
Culture: NO GROWTH

## 2024-04-19 MED ORDER — DICLOFENAC SODIUM 1 % EX GEL
4.0000 g | Freq: Four times a day (QID) | CUTANEOUS | Status: DC
  Administered 2024-04-20 – 2024-04-21 (×4): 4 g via TOPICAL
  Filled 2024-04-19 (×2): qty 100

## 2024-04-19 MED ORDER — ORAL CARE MOUTH RINSE: 15.0000 mL | OROMUCOSAL | Status: DC | PRN

## 2024-04-19 MED ORDER — ALBUMIN HUMAN 25 % IV SOLN
12.5000 g | Freq: Once | INTRAVENOUS | Status: AC
Start: 2024-04-19 — End: 2024-04-19
  Administered 2024-04-19: 12.5 g via INTRAVENOUS
  Filled 2024-04-19: qty 50

## 2024-04-19 NOTE — Progress Notes (Signed)
 PROGRESS NOTE  Jeffrey Stewart FMW:[id number] DOB: 05-30-1935 DOA: 04/14/2024 PCP: Anita Bernardino [name], FNP  Brief History:  89 y.o. male with medical history significant for dCHF, interstitial lung disease, RBBB, rheumatoid arthritis, CAD. Patient was brought to the ED via EMS with reports of vomiting of blood, and blood in his stools that started this morning.  Spouse is at bedside and confirms that patients vomitus contained golf sized blood clots.  He is not aware of having any black stools.  Reports some lower abdominal pain earlier today that has since resolved.  He has history of hemorrhoids which occasionally bleed, but not to this extent, he has never vomited blood.  He denies NSAID use.  EMS reports blood pressure transiently down to 80s and route.   Recently hospitalized 9/29 to 9/27 for hip fracture-underwent ORIF 03/2021.  Also with acute blood loss anemia postprocedure, requiring 1 unit PRBC.  Hemoglobin on discharge was 7.1.  Refused SNF and was discharged to home.  He is not on full dose anticoagulation.   ED Course: Temperature 98.2.  Tachycardic heart rate 94-116.  Respiratory rate 18- 35.  Blood pressure systolic 94-115.  O2 sat greater than 92% on room air. Hemoglobin 6.3.  WBC 28.2.  Lactic acid 3.9.  CTAP W OC-diarrheal state, correlation with clinical exam and stool cultures recommended.  EDP talked to Dr. Cindie, n.p.o., IV PPI twice daily, plan for EGD 9/30.   Assessment/Plan:  Hemorrhagic Shock Acute blood loss anemia Multiple gastric ulcers  -- pt had blood BMs/hematemesis, transferred to ICU started on pressor support for hypotension -- pt had another blood BM 04/17/24 AM>>no more since then -- 5 units PRBC transfused this admisson -- he remains on levophed 1-2 mcg/kg/min -- 9/30 EGD--3x nonbleeding cratered gastric ulcers injected with epi and bipolar cautery -- continue pantoprazole 40 mg IV BID -- sucralfate suspension QID for 8 weeks -- avoid all NSAIDS--pt  was on ASA 81 mg bid PTA -- 10/2 CT GI bleed--Hyperdense enteric material or contrast within the cecum and ascending colon, Otherwise, no extravasation of contrast in the remainder of the GI tract to suggest active GI bleeding. -- wean IV norepinephrine - added midodrine, already on stress steroids -- restarted clears 10/3>>full liquids on 10/4 -- give albumin  x 1 10/4   Acute on CKD stage 3a Lactic acidosis Hyperkalemia -- treated with IV dextrose  and insulin initially -- given lokelma 10 g 2 doses -- baseline creatinine 0.9-1.2 -- initially fluid resuscitated -- renal US --negative hydronephrosis -- lactic acidosis trending down with IV fluid and improving low blood pressure -- overall improved   Leukemoid reaction -- multifactorial including steroids, stress demargination, transfusions --9/29 and 10/2 blood cultures neg -- 9/29 UA--no pyuria --overall improved --pt is on IV steroids; remains afebrile   New irregular nodular density seen in right upper lobe  -- 10/1 CT chest--irregular 1.0x 2.2 cm RUL opacity, 5x84mm LLL nodule -- PCT 0.79 -- treat empirically with abx as aspiration/PNA -- outpt follow up with pulmonary and surveillance CT -- pt and family informed of plan    Glaucoma -- resumed home eye drops   Chronic HFpEF -2/923 Echo EF 50%, G1DD, mod decrease RVF -holding torsemide  temporarily   Right hip fracture- 9/29 to 9/27 for hip fracture-underwent ORIF 03/2021. Right thigh has extensive ecchymosis, likely contributed to the anemia. - Hold aspirin  and all anticoagulation    Stage I decubitus ulcer -- skin care protocol  Rheumatoid arthritis/interstitial lung disease- On chronic prednisone  5 mg a.m. and 1 mg p.m. and mycophenolate . - Resume mycophenolate  - remains on stress dose steroids        Family Communication:   spouse/DTR at bedside 10/4   Consultants:  GI   Code Status:  FULL   DVT Prophylaxis:  SCDs     Procedures: As Listed in Progress  Note Above   Antibiotics: Cefepime 10/2>> Doxy 10/2>>         Subjective: Patient denies fevers, chills, headache, chest pain, dyspnea, nausea, vomiting, diarrhea, abdominal pain, dysuria, hematuria, hematochezia, and melena.   Objective: Vitals:   04/19/24 1215 04/19/24 1230 04/19/24 1245 04/19/24 1300  BP: (!) 93/35 (!) 114/44 (!) 101/36 (!) 95/34  Pulse: 83 77 80 77  Resp: 15 (!) 22 (!) 28 (!) 21  Temp:      TempSrc:      SpO2: 96% 98% 98% 96%  Weight:      Height:        Intake/Output Summary (Last 24 hours) at 04/19/2024 1419 Last data filed at 04/19/2024 1300 Gross per 24 hour  Intake 1129.72 ml  Output 700 ml  Net 429.72 ml   Weight change:  Exam:  General:  Pt is alert, follows commands appropriately, not in acute distress HEENT: No icterus, No thrush, No neck mass, [place]/AT Cardiovascular: RRR, S1/S2, no rubs, no gallops Respiratory: bibasilar rales.  No wheeze Abdomen: Soft/+BS, non tender, non distended, no guarding Extremities: 1 +LE edema, No lymphangitis, No petechiae, No rashes, no synovitis   Data Reviewed: I have personally reviewed following labs and imaging studies Basic Metabolic Panel: Recent Labs  Lab 04/15/24 0339 04/15/24 1154 04/16/24 0506 04/17/24 0435 04/18/24 0521 04/19/24 0522  NA 138 140 139 140 139 138  K 5.7* 5.2* 4.4 3.9 3.9 4.0  CL 104 107 108 108 111 110  CO2 20* 21* 20* 23 21* 21*  GLUCOSE 212* 175* 162* 173* 142* 144*  BUN 87* 87* 81* 59* 42* 32*  CREATININE 2.16* 2.06* 1.80* 1.29* 1.02 0.98  CALCIUM  7.4* 7.6* 7.4* 7.7* 7.8* 7.9*  MG 2.4  --   --  2.6*  --   --   PHOS 4.7*  --   --   --   --   --    Liver Function Tests: Recent Labs  Lab 04/14/24 1422 04/19/24 0522  AST 26 19  ALT 13 10  ALKPHOS 42 55  BILITOT 1.7* 1.2  PROT 4.7* 3.9*  ALBUMIN  2.6* 2.7*   Recent Labs  Lab 04/14/24 1422  LIPASE 30   No results for input(s): AMMONIA in the last 168 hours. Coagulation Profile: Recent Labs  Lab  04/14/24 1422  INR 1.1   CBC: Recent Labs  Lab 04/14/24 1422 04/14/24 2040 04/15/24 1154 04/16/24 0506 04/16/24 1214 04/17/24 0435 04/17/24 1418 04/18/24 0521 04/19/24 0522  WBC 28.2*   < > 25.1* 17.7*  --  13.9*  --  11.9* 18.4*  NEUTROABS 25.9*  --  19.1* 13.6*  --  10.4*  --  8.4*  --   HGB 6.3*   < > 9.5* 7.8* 7.7* 7.5* 8.1* 8.0* 8.4*  HCT 19.7*   < > 28.5* 24.6* 24.4* 23.6* 24.9* 24.9* 25.5*  MCV 113.2*   < > 95.6 98.4  --  99.6  --  100.0 99.6  PLT 274   < > 239 188  --  192  --  171 190   < > = values  in this interval not displayed.   Cardiac Enzymes: No results for input(s): CKTOTAL, CKMB, CKMBINDEX, TROPONINI in the last 168 hours. BNP: Invalid input(s): POCBNP CBG: Recent Labs  Lab 04/14/24 2210 04/15/24 0541 04/15/24 1342 04/17/24 1614  GLUCAP 175* 221* 154* 145*   HbA1C: No results for input(s): HGBA1C in the last 72 hours. Urine analysis:    Component Value Date/Time   COLORURINE YELLOW 04/14/2024 2215   APPEARANCEUR HAZY (A) 04/14/2024 2215   LABSPEC 1.020 04/14/2024 2215   PHURINE 5.0 04/14/2024 2215   GLUCOSEU NEGATIVE 04/14/2024 2215   HGBUR NEGATIVE 04/14/2024 2215   BILIRUBINUR NEGATIVE 04/14/2024 2215   KETONESUR NEGATIVE 04/14/2024 2215   PROTEINUR NEGATIVE 04/14/2024 2215   UROBILINOGEN 1.0 09/24/2013 0905   NITRITE NEGATIVE 04/14/2024 2215   LEUKOCYTESUR NEGATIVE 04/14/2024 2215   Sepsis Labs: @LABRCNTIP (procalcitonin:4,lacticidven:4) ) Recent Results (from the past 240 hours)  Resp panel by RT-PCR (RSV, Flu A&B, Covid) Anterior Nasal Swab     Status: None   Collection Time: 04/14/24  2:03 PM   Specimen: Anterior Nasal Swab  Result Value Ref Range Status   SARS Coronavirus 2 by RT PCR NEGATIVE NEGATIVE Final    Comment: (NOTE) SARS-CoV-2 target nucleic acids are NOT DETECTED.  The SARS-CoV-2 RNA is generally detectable in upper respiratory specimens during the acute phase of infection. The lowest concentration of  SARS-CoV-2 viral copies this assay can detect is 138 copies/mL. A negative result does not preclude SARS-Cov-2 infection and should not be used as the sole basis for treatment or other patient management decisions. A negative result may occur with  improper specimen collection/handling, submission of specimen other than nasopharyngeal swab, presence of viral mutation(s) within the areas targeted by this assay, and inadequate number of viral copies(<138 copies/mL). A negative result must be combined with clinical observations, patient history, and epidemiological information. The expected result is Negative.  Fact Sheet for Patients:  BloggerCourse.com  Fact Sheet for Healthcare Providers:  SeriousBroker.it  This test is no t yet approved or cleared by the [place]  FDA and  has been authorized for detection and/or diagnosis of SARS-CoV-2 by FDA under an Emergency Use Authorization (EUA). This EUA will remain  in effect (meaning this test can be used) for the duration of the COVID-19 declaration under Section 564(b)(1) of the Act, 21 U.S.C.section 360bbb-3(b)(1), unless the authorization is terminated  or revoked sooner.       Influenza A by PCR NEGATIVE NEGATIVE Final   Influenza B by PCR NEGATIVE NEGATIVE Final    Comment: (NOTE) The Xpert Xpress SARS-CoV-2/FLU/RSV plus assay is intended as an aid in the diagnosis of influenza from Nasopharyngeal swab specimens and should not be used as a sole basis for treatment. Nasal washings and aspirates are unacceptable for Xpert Xpress SARS-CoV-2/FLU/RSV testing.  Fact Sheet for Patients: BloggerCourse.com  Fact Sheet for Healthcare Providers: SeriousBroker.it  This test is not yet approved or cleared by the [place]  FDA and has been authorized for detection and/or diagnosis of SARS-CoV-2 by FDA under an Emergency Use  Authorization (EUA). This EUA will remain in effect (meaning this test can be used) for the duration of the COVID-19 declaration under Section 564(b)(1) of the Act, 21 U.S.C. section 360bbb-3(b)(1), unless the authorization is terminated or revoked.     Resp Syncytial Virus by PCR NEGATIVE NEGATIVE Final    Comment: (NOTE) Fact Sheet for Patients: BloggerCourse.com  Fact Sheet for Healthcare Providers: SeriousBroker.it  This test is not yet approved or cleared by  the [place]  FDA and has been authorized for detection and/or diagnosis of SARS-CoV-2 by FDA under an Emergency Use Authorization (EUA). This EUA will remain in effect (meaning this test can be used) for the duration of the COVID-19 declaration under Section 564(b)(1) of the Act, 21 U.S.C. section 360bbb-3(b)(1), unless the authorization is terminated or revoked.  Performed at [facility], [address], [place], [place] [address]   Culture, blood (Routine X 2) w Reflex to ID Panel     Status: None   Collection Time: 04/14/24  9:26 PM   Specimen: BLOOD  Result Value Ref Range Status   Specimen Description BLOOD BLOOD RIGHT HAND  Final   Special Requests NONE  Final   Culture   Final    NO GROWTH 5 DAYS Performed at [facility], [address], [place], [place] [address]    Report Status 04/19/2024 FINAL  Final  Culture, blood (Routine X 2) w Reflex to ID Panel     Status: None   Collection Time: 04/14/24  9:26 PM   Specimen: BLOOD  Result Value Ref Range Status   Specimen Description BLOOD BLOOD LEFT HAND  Final   Special Requests NONE  Final   Culture   Final    NO GROWTH 5 DAYS Performed at [facility], [address], [place], [place] [address]    Report Status 04/19/2024 FINAL  Final  MRSA Next Gen by PCR, Nasal     Status: Abnormal   Collection Time: 04/14/24  9:39 PM   Specimen: Nasal Mucosa; Nasal Swab  Result Value Ref Range Status   MRSA by PCR  Next Gen DETECTED (A) NOT DETECTED Final    Comment: RESULT CALLED TO, READ BACK BY AND VERIFIED WITH: R ESOCE,RN@0014  04/15/24 MK (NOTE) The GeneXpert MRSA Assay (FDA approved for NASAL specimens only), is one component of a comprehensive MRSA colonization surveillance program. It is not intended to diagnose MRSA infection nor to guide or monitor treatment for MRSA infections. Test performance is not FDA approved in patients less than 49 years old. Performed at [facility], [address], [place], [place] [address]   Culture, blood (Routine X 2) w Reflex to ID Panel     Status: None (Preliminary result)   Collection Time: 04/17/24  9:52 AM   Specimen: BLOOD  Result Value Ref Range Status   Specimen Description BLOOD BLOOD LEFT ARM  Final   Special Requests   Final    BOTTLES DRAWN AEROBIC AND ANAEROBIC Blood Culture adequate volume   Culture   Final    NO GROWTH 2 DAYS Performed at [facility], [address], [place], [place] [address]    Report Status PENDING  Incomplete  Culture, blood (Routine X 2) w Reflex to ID Panel     Status: None (Preliminary result)   Collection Time: 04/17/24  9:52 AM   Specimen: BLOOD  Result Value Ref Range Status   Specimen Description BLOOD BLOOD RIGHT HAND AEROBIC BOTTLE ONLY  Final   Special Requests   Final    Blood Culture results may not be optimal due to an inadequate volume of blood received in culture bottles   Culture   Final    NO GROWTH 2 DAYS Performed at [facility], [address], [place], [place] [address]    Report Status PENDING  Incomplete     Scheduled Meds:  sodium chloride    Intravenous Once   sodium chloride    Intravenous Once   sodium  chloride   Intravenous Once   sodium chloride    Intravenous Once   atorvastatin   40 mg Oral QHS   brimonidine   1 drop Both Eyes BID   Chlorhexidine  Gluconate Cloth  6 each Topical Q0600   fluticasone  furoate-vilanterol  1 puff Inhalation Daily   hydrocortisone  sod succinate  (SOLU-CORTEF ) inj  40 mg Intravenous Q12H   latanoprost   1 drop Both Eyes QHS   midodrine  5 mg Oral TID WC   mycophenolate   1,000 mg Oral BID   omega-3 acid ethyl esters  1 g Oral BID   pantoprazole  40 mg Intravenous Q12H   sodium chloride  flush  10-40 mL Intracatheter Q12H   sucralfate  1 g Oral TID WC & HS   Continuous Infusions:  ceFEPime (MAXIPIME) IV Stopped (04/19/24 1032)   doxycycline (VIBRAMYCIN) IV Stopped (04/19/24 1242)   norepinephrine (LEVOPHED) Adult infusion Stopped (04/19/24 1146)    Procedures/Studies: CT ANGIO GI BLEED Result Date: 04/17/2024 CLINICAL DATA:  persistent hematochezia, blood loss anemia EXAM: CTA ABDOMEN AND PELVIS WITHOUT AND WITH CONTRAST TECHNIQUE: Initially, noncontrast CT of the abdomen and pelvis were performed. Subsequently, Multidetector CT imaging of the abdomen and pelvis was performed using the standard protocol during bolus administration of intravenous contrast. Multiplanar reconstructed images and MIPs were obtained and reviewed to evaluate the vascular anatomy. RADIATION DOSE REDUCTION: This exam was performed according to the departmental dose-optimization program which includes automated exposure control, adjustment of the mA and/or kV according to patient size and/or use of iterative reconstruction technique. CONTRAST:  80mL OMNIPAQUE  IOHEXOL  350 MG/ML SOLN COMPARISON:  04/14/2024 FINDINGS: VASCULAR Aorta: No aortic aneurysm or dissection. Diffuse aortic atherosclerosis. No hemodynamically significant stenosis. Celiac: Patent without acute thrombus, aneurysm, or dissection.No hemodynamically significant stenosis. SMA: Patent without acute thrombus, aneurysm, or dissection.No hemodynamically significant stenosis. Renals: Patent without acute thrombus, aneurysm, or dissection.Mild stenosis of both renal artery ostia from calcified plaque. IMA: Patent without acute thrombus, aneurysm, or dissection.Moderate narrowing of the IMA ostium from calcified  plaque. Inflow: Patent without acute thrombus, aneurysm, or dissection.Diffuse calcified atherosclerosis throughout the inflow vessels without hemodynamically significant stenosis. Proximal Outflow: The bilateral common femoral and visualized portions of the superficial and profunda femoral arteries are patent without acute thrombus, aneurysm, or dissection.No hemodynamically significant stenosis. Veins: No obvious venous abnormality within the limitations of this arterial phase study. Review of the MIP images confirms the above findings. NON-VASCULAR Lower chest: No focal airspace consolidation or pleural effusion.Posterior bibasilar dependent atelectasis. Multifocal scarring also present in the lung bases. Hepatobiliary: No mass.No radiopaque stones or wall thickening of the gallbladder.No intrahepatic or extrahepatic biliary ductal dilation. Pancreas: No mass or main ductal dilation.No peripancreatic inflammation or fluid collection. Spleen: Normal size. No mass. Adrenals/Urinary Tract: No adrenal masses. No renal mass. No hydronephrosis or nephrolithiasis. The urinary bladder is distended without focal abnormality. Stomach/Bowel: The stomach is decompressed without focal abnormality. No small bowel wall thickening or inflammation. No small bowel obstruction.The appendix was not visualized. No right lower quadrant or pericecal inflammatory changes to suggest acute appendicitis. Hyperdense enteric material or contrast within the cecum and ascending colon. Scattered colonic diverticulosis. GI Bleed: No extravasation of contrast to suggest active GI bleeding. Lymphatic: No intraabdominal or pelvic lymphadenopathy. Reproductive: No prostatomegaly.No free pelvic fluid. Other: No pneumoperitoneum, ascites, or mesenteric inflammation. Bilateral inguinal hernia repairs. Musculoskeletal: No acute fracture or destructive lesion.Partially visualized right femoral intramedullary nail with persistent fracture of the right  intertrochanteric femoral neck. Likely postsurgical edema along the right hip.  Multilevel degenerative disc disease of the spine. IMPRESSION: VASCULAR 1. No aortic aneurysm, intramural hematoma, or aortic dissection. 2. Hyperdense enteric material or contrast within the cecum and ascending colon, which limits evaluation for GI bleed in this region. Otherwise, no extravasation of contrast in the remainder of the GI tract to suggest active GI bleeding. NON-VASCULAR No additional, acute findings within the abdomen or pelvis. Aortic Atherosclerosis (ICD10-I70.0). Electronically Signed   By: Rogelia Myers M.D.   On: 04/17/2024 09:58   CT CHEST WO CONTRAST Result Date: 04/16/2024 CLINICAL DATA:  Respiratory illness, nondiagnostic xray RUL opacity on CXR. EXAM: CT CHEST WITHOUT CONTRAST TECHNIQUE: Multidetector CT imaging of the chest was performed following the standard protocol without IV contrast. RADIATION DOSE REDUCTION: This exam was performed according to the departmental dose-optimization program which includes automated exposure control, adjustment of the mA and/or kV according to patient size and/or use of iterative reconstruction technique. COMPARISON:  Chest radiograph from 04/15/2024. FINDINGS: Cardiovascular: Normal cardiac size. No pericardial effusion. No aortic aneurysm. There are coronary artery calcifications, in keeping with coronary artery disease. There are also moderate peripheral atherosclerotic vascular calcifications of thoracic aorta and its major branches. Aberrant origin of right subclavian artery noted, which arises distal to the left subclavian artery and courses towards the right side, posterior to the esophagus. Mediastinum/Nodes: Visualized thyroid gland appears grossly unremarkable. No solid / cystic mediastinal masses. The esophagus is nondistended precluding optimal assessment. There are few mildly prominent mediastinal lymph nodes, which do not meet the size criteria for  lymphadenopathy and appear grossly similar to the prior study, favoring benign etiology. No axillary lymphadenopathy by size criteria. Evaluation of bilateral hila is limited due to lack on intravenous contrast: however, no large hilar lymphadenopathy identified. Lungs/Pleura: The central tracheo-bronchial tree is patent. There is a new irregular solid noncalcified 1.0 x 2.2 cm opacity in the subpleural right upper lobe. There are several additional linear areas of scarring/atelectasis throughout bilateral lungs, which are grossly similar to the prior study. No lung collapse, pleural effusion or pneumothorax. No pulmonary edema. There is a 5 x 5 mm ground-glass nodule in the left lung lower lobe, which is also new since the prior study. Upper Abdomen: There is small sliding hiatal hernia. Visualized upper abdominal viscera within normal limits. Musculoskeletal: Right-sided PICC line noted with its tip in the cavo-atrial junction region. Visualized soft tissues of the chest wall are otherwise grossly unremarkable. No suspicious osseous lesions. There are mild multilevel degenerative changes in the visualized spine. IMPRESSION: 1. There is a new irregular solid noncalcified 1.0 x 2.2 cm opacity in the subpleural right upper lobe. This opacity corresponds to the density described on the recent chest radiograph. This is suspicious. Pulmonary consultation is recommended. 2. There is also a new 5 x 5 mm ground-glass nodule in the left lung lower lobe. This is also indeterminate and differential diagnosis includes infectious/inflammatory etiology versus neoplastic process. 3. Multiple other nonacute observations, as described above. Aortic Atherosclerosis (ICD10-I70.0). Electronically Signed   By: Ree Molt M.D.   On: 04/16/2024 16:44   US  RENAL Result Date: 04/16/2024 CLINICAL DATA:  Acute kidney injury. EXAM: RENAL / URINARY TRACT ULTRASOUND COMPLETE COMPARISON:  None Available. FINDINGS: Right Kidney: Renal  measurements: 10.9 x 5.2 x 5.3 cm = volume: 156 mL. Increased cortical echogenicity. Mild cortical atrophy. No hydronephrosis. Left Kidney: Renal measurements: 13.3 x 5.9 x 5.5 cm = volume: 225 mL. Mildly increased cortical echogenicity. No hydronephrosis. Bladder: Appears normal for degree of bladder distention.  Other: None. IMPRESSION: Increased cortical echogenicity of both kidneys consistent with chronic kidney disease. No evidence of hydronephrosis. Electronically Signed   By: Marcey Moan M.D.   On: 04/16/2024 14:09   DG Chest Portable 1 View Result Date: 04/15/2024 CLINICAL DATA:  PICC placement. EXAM: PORTABLE CHEST 1 VIEW COMPARISON:  April 14, 2024. FINDINGS: Stable cardiomediastinal silhouette. Right-sided PICC line is noted with distal tip in expected position of the SVC. Minimal bibasilar subsegmental atelectasis or scarring is noted with probable left basilar pleural thickening. New irregular nodular density seen in right upper lobe; CT scan of the chest is recommended for further evaluation. IMPRESSION: 1. Right-sided PICC line is noted with distal tip in expected position of the SVC. 2. New irregular nodular density seen in right upper lobe; CT scan of the chest is recommended for further evaluation. Electronically Signed   By: Lynwood Landy Raddle M.D.   On: 04/15/2024 17:10   US  EKG SITE RITE Result Date: 04/15/2024 If Site Rite image not attached, placement could not be confirmed due to current cardiac rhythm.  DG Chest Port 1 View Result Date: 04/14/2024 CLINICAL DATA:  Questionable sepsis. EXAM: PORTABLE CHEST 1 VIEW COMPARISON:  Chest radiograph dated 04/06/2024 FINDINGS: Shallow inspiration with bibasilar atelectasis. No consolidative changes. No pleural effusion pneumothorax. Stable cardiac silhouette. No acute osseous pathology. IMPRESSION: Shallow inspiration with bibasilar atelectasis. No focal consolidation. Electronically Signed   By: Vanetta Chou M.D.   On: 04/14/2024  17:30   CT ABDOMEN PELVIS WO CONTRAST Result Date: 04/14/2024 CLINICAL DATA:  Abdominal pain.  Hematemesis. EXAM: CT ABDOMEN AND PELVIS WITHOUT CONTRAST TECHNIQUE: Multidetector CT imaging of the abdomen and pelvis was performed following the standard protocol without IV contrast. RADIATION DOSE REDUCTION: This exam was performed according to the departmental dose-optimization program which includes automated exposure control, adjustment of the mA and/or kV according to patient size and/or use of iterative reconstruction technique. COMPARISON:  Abdominal ultrasound dated 11/04/2021. FINDINGS: Evaluation of this exam is limited in the absence of intravenous contrast. Lower chest: Bibasilar subpleural atelectasis/scarring. There is coronary vascular calcification. No intra-abdominal free air or free fluid. Hepatobiliary: The liver is unremarkable. No biliary dilatation. The gallbladder is unremarkable. Pancreas: Unremarkable. No pancreatic ductal dilatation or surrounding inflammatory changes. Spleen: Normal in size without focal abnormality. Adrenals/Urinary Tract: The adrenal glands unremarkable. There is no hydronephrosis or nephrolithiasis on either side. Bilateral perinephric stranding, nonspecific. Correlation with urinalysis recommended to exclude UTI. The visualized ureters and urinary bladder appear unremarkable. Stomach/Bowel: There is loose stool throughout the colon suggestive of diarrheal state. Correlation with clinical exam and stool cultures recommended. There is no bowel obstruction or active inflammation. Appendectomy. Vascular/Lymphatic: Moderate aortoiliac atherosclerotic disease. The IVC is unremarkable. No portal venous gas. There is no adenopathy. Reproductive: The prostate and seminal vesicles are grossly unremarkable. Other: Small fat containing bilateral inguinal hernias. Anterior pelvic wall hernia repair mesh. Musculoskeletal: Osteopenia with degenerative changes of the spine. Status  post ORIF of right femoral neck fracture. There is edema in the surrounding soft tissues of the right hip. No fluid collection. IMPRESSION: 1. Diarrheal state. Correlation with clinical exam and stool cultures recommended. No bowel obstruction. 2. No hydronephrosis or nephrolithiasis. 3.  Aortic Atherosclerosis (ICD10-I70.0). Electronically Signed   By: Vanetta Chou M.D.   On: 04/14/2024 17:29   DG HIP UNILAT WITH PELVIS 2-3 VIEWS RIGHT Result Date: 04/07/2024 CLINICAL DATA:  Postop internal fixation EXAM: DG HIP (WITH OR WITHOUT PELVIS) 2-3V RIGHT COMPARISON:  04/06/2024 FINDINGS: Changes  of internal fixation across the right femoral intertrochanteric fracture. Anatomic alignment. No hardware complicating feature. IMPRESSION: Internal fixation.  No hardware complicating feature. Electronically Signed   By: Franky Crease M.D.   On: 04/07/2024 23:15   DG HIP UNILAT WITH PELVIS 2-3 VIEWS RIGHT Result Date: 04/07/2024 CLINICAL DATA:  Right hip internal fixation. EXAM: DG HIP (WITH OR WITHOUT PELVIS) 2-3V RIGHT COMPARISON:  04/06/2024 FINDINGS: Multiple intraoperative spot images demonstrate internal fixation across the right femoral intertrochanteric fracture. No hardware complicating feature. IMPRESSION: Internal fixation with anatomic alignment.  No complicating feature. Electronically Signed   By: Franky Crease M.D.   On: 04/07/2024 23:07   DG C-Arm 1-60 Min-No Report Result Date: 04/07/2024 Fluoroscopy was utilized by the requesting physician.  No radiographic interpretation.   DG Chest Port 1 View Result Date: 04/06/2024 CLINICAL DATA:  Pre-surgical. EXAM: RIGHT KNEE - 1-2 VIEW; PORTABLE CHEST - 1 VIEW COMPARISON:  09/08/2023, 04/06/2024. FINDINGS: Right knee: No acute fracture or dislocation is seen. There is a bony exostosis along the lateral aspect of the mid femur mild to moderate tricompartmental degenerative changes are noted at the knee. There is a trace joint effusion. Vascular calcifications  are present in the soft tissues. Chest: The heart is enlarged and the mediastinal contour is within normal limits for supine positioning. Atherosclerotic calcification of the aorta is noted. Lung volumes are low with atelectasis or infiltrate at the lung bases. There small bilateral pleural effusions. No pneumothorax is seen. No acute osseous abnormality. IMPRESSION: 1. No acute fracture or dislocation at the right knee. 2. Small bilateral pleural effusions with atelectasis or infiltrate at the lung bases. 3. Cardiomegaly. Electronically Signed   By: Leita Birmingham M.D.   On: 04/06/2024 17:29   DG Knee 1-2 Views Right Result Date: 04/06/2024 CLINICAL DATA:  Pre-surgical. EXAM: RIGHT KNEE - 1-2 VIEW; PORTABLE CHEST - 1 VIEW COMPARISON:  09/08/2023, 04/06/2024. FINDINGS: Right knee: No acute fracture or dislocation is seen. There is a bony exostosis along the lateral aspect of the mid femur mild to moderate tricompartmental degenerative changes are noted at the knee. There is a trace joint effusion. Vascular calcifications are present in the soft tissues. Chest: The heart is enlarged and the mediastinal contour is within normal limits for supine positioning. Atherosclerotic calcification of the aorta is noted. Lung volumes are low with atelectasis or infiltrate at the lung bases. There small bilateral pleural effusions. No pneumothorax is seen. No acute osseous abnormality. IMPRESSION: 1. No acute fracture or dislocation at the right knee. 2. Small bilateral pleural effusions with atelectasis or infiltrate at the lung bases. 3. Cardiomegaly. Electronically Signed   By: Leita Birmingham M.D.   On: 04/06/2024 17:29   DG Hip Unilat W or Wo Pelvis 2-3 Views Right Result Date: 04/06/2024 CLINICAL DATA:  Fall with diffuse pain. EXAM: DG HIP (WITH OR WITHOUT PELVIS) 2-3V RIGHT COMPARISON:  None Available. FINDINGS: There is diffusely decreased mineralization of the bones. There is a nondisplaced intratrochanteric fracture  of the proximal right femur. The remaining bony structures are intact and there is no dislocation. Degenerative changes are present at the bilateral hips, sacroiliac joints and lower lumbar spine. Vascular calcifications are seen in the soft tissues. IMPRESSION: Minimally displaced intertrochanteric fracture of the proximal right femur. Electronically Signed   By: Leita Birmingham M.D.   On: 04/06/2024 16:22   CT Head Wo Contrast Result Date: 04/06/2024 CLINICAL DATA:  Fall with head injury. EXAM: CT HEAD WITHOUT CONTRAST CT CERVICAL SPINE  WITHOUT CONTRAST TECHNIQUE: Multidetector CT imaging of the head and cervical spine was performed following the standard protocol without intravenous contrast. Multiplanar CT image reconstructions of the cervical spine were also generated. RADIATION DOSE REDUCTION: This exam was performed according to the departmental dose-optimization program which includes automated exposure control, adjustment of the mA and/or kV according to patient size and/or use of iterative reconstruction technique. COMPARISON:  CT cervical spine 07/21/2017. FINDINGS: CT HEAD FINDINGS Brain: Ventricles, cisterns and other CSF spaces are normal. Minimal chronic ischemic microvascular disease. No mass, mass effect, shift of midline structures or acute hemorrhage. No evidence of acute infarction. Vascular: No hyperdense vessel or unexpected calcification. Skull: Normal. Negative for fracture or focal lesion. Sinuses/Orbits: Orbits are normal. Minimal opacification over the right sphenoid sinus compatible mild chronic inflammatory change. Other: None. CT CERVICAL SPINE FINDINGS Alignment: Subtle anterior stairstep subluxations over the mid to lower cervical spine unchanged. No posttraumatic subluxation. Skull base and vertebrae: Vertebral body heights are normal. There is mild to moderate spondylosis throughout the cervical spine to include uncovertebral joint spurring and facet arthropathy. Mild to moderate  left-sided neural foraminal narrowing at the C2-3 level. Mild bilateral neural foraminal narrowing at the C3-4 level and moderate right-sided neural foraminal narrowing at the C4-5 level. Minimal left-sided neural foraminal narrowing at the C5-6 level. No acute fracture. Prevertebral soft tissues are normal. Soft tissues and spinal canal: No spinal canal abnormality. Prevertebral soft tissues normal. Disc levels: Disc space narrowing at the C5-6 and C6-7 levels. Also disc space narrowing at the C7-T1 level. Upper chest: No acute findings. Other: Oval mass over the posterior aspect of the right parotid gland measuring 1.2 cm in short axis with significant enlargement compared to the prior exam. IMPRESSION: 1. No acute brain injury. 2. Minimal chronic ischemic microvascular disease. 3. No acute cervical spine injury. 4. Mild to moderate spondylosis throughout the cervical spine with disc disease at the C5-6, C6-7 and C7-T1 levels. Multilevel neural foraminal narrowing as described. 5. 1.2 cm oval mass over the posterior aspect of the right parotid gland with significant enlargement compared to the prior exam. This may represent an enlarged lymph node versus a primary parotid neoplasm. Consider MRI with without contrast for further evaluation. Electronically Signed   By: Toribio Agreste M.D.   On: 04/06/2024 16:15   CT Cervical Spine Wo Contrast Result Date: 04/06/2024 CLINICAL DATA:  Fall with head injury. EXAM: CT HEAD WITHOUT CONTRAST CT CERVICAL SPINE WITHOUT CONTRAST TECHNIQUE: Multidetector CT imaging of the head and cervical spine was performed following the standard protocol without intravenous contrast. Multiplanar CT image reconstructions of the cervical spine were also generated. RADIATION DOSE REDUCTION: This exam was performed according to the departmental dose-optimization program which includes automated exposure control, adjustment of the mA and/or kV according to patient size and/or use of iterative  reconstruction technique. COMPARISON:  CT cervical spine 07/21/2017. FINDINGS: CT HEAD FINDINGS Brain: Ventricles, cisterns and other CSF spaces are normal. Minimal chronic ischemic microvascular disease. No mass, mass effect, shift of midline structures or acute hemorrhage. No evidence of acute infarction. Vascular: No hyperdense vessel or unexpected calcification. Skull: Normal. Negative for fracture or focal lesion. Sinuses/Orbits: Orbits are normal. Minimal opacification over the right sphenoid sinus compatible mild chronic inflammatory change. Other: None. CT CERVICAL SPINE FINDINGS Alignment: Subtle anterior stairstep subluxations over the mid to lower cervical spine unchanged. No posttraumatic subluxation. Skull base and vertebrae: Vertebral body heights are normal. There is mild to moderate spondylosis throughout the cervical spine  to include uncovertebral joint spurring and facet arthropathy. Mild to moderate left-sided neural foraminal narrowing at the C2-3 level. Mild bilateral neural foraminal narrowing at the C3-4 level and moderate right-sided neural foraminal narrowing at the C4-5 level. Minimal left-sided neural foraminal narrowing at the C5-6 level. No acute fracture. Prevertebral soft tissues are normal. Soft tissues and spinal canal: No spinal canal abnormality. Prevertebral soft tissues normal. Disc levels: Disc space narrowing at the C5-6 and C6-7 levels. Also disc space narrowing at the C7-T1 level. Upper chest: No acute findings. Other: Oval mass over the posterior aspect of the right parotid gland measuring 1.2 cm in short axis with significant enlargement compared to the prior exam. IMPRESSION: 1. No acute brain injury. 2. Minimal chronic ischemic microvascular disease. 3. No acute cervical spine injury. 4. Mild to moderate spondylosis throughout the cervical spine with disc disease at the C5-6, C6-7 and C7-T1 levels. Multilevel neural foraminal narrowing as described. 5. 1.2 cm oval mass  over the posterior aspect of the right parotid gland with significant enlargement compared to the prior exam. This may represent an enlarged lymph node versus a primary parotid neoplasm. Consider MRI with without contrast for further evaluation. Electronically Signed   By: Toribio Agreste M.D.   On: 04/06/2024 16:15    Alm Schneider, DO  [facility]  If 7PM-7AM, please contact night-coverage [email or web address] Password [email or web address] 04/19/2024, 2:19 PM   LOS: 5 days

## 2024-04-19 NOTE — Progress Notes (Signed)
 Gastroenterology & Hepatology   Interval History: Patient seen and examined bedside with family in room.  Patient is having liquid diet.  He is in good spirits and feeling well.  Having had bowel movement for past 2 days.  No further blood transfusion requirement and hemoglobin has remained stable.  He is off pressors now for at least past 3 hours   Inpatient Medications:  Current Facility-Administered Medications:    0.9 %  sodium chloride  infusion (Manually program via Guardrails IV Fluids), , Intravenous, Once, Melvenia Motto, MD   0.9 %  sodium chloride  infusion (Manually program via Guardrails IV Fluids), , Intravenous, Once, Segars, Dorn, MD   0.9 %  sodium chloride  infusion (Manually program via Guardrails IV Fluids), , Intravenous, Once, Segars, Jonathan, MD   0.9 %  sodium chloride  infusion (Manually program via Guardrails IV Fluids), , Intravenous, Once, Johnson, Clanford L, MD   acetaminophen  (TYLENOL ) tablet 650 mg, 650 mg, Oral, Q6H PRN, 650 mg at 04/19/24 0955 **OR** acetaminophen  (TYLENOL ) suppository 650 mg, 650 mg, Rectal, Q6H PRN, Emokpae, Ejiroghene E, MD   artificial tears ophthalmic solution 1 drop, 1 drop, Both Eyes, PRN, Johnson, Clanford L, MD   atorvastatin  (LIPITOR) tablet 40 mg, 40 mg, Oral, QHS, Johnson, Clanford L, MD, 40 mg at 04/18/24 2147   brimonidine  (ALPHAGAN ) 0.2 % ophthalmic solution 1 drop, 1 drop, Both Eyes, BID, Johnson, Clanford L, MD, 1 drop at 04/19/24 0942   ceFEPIme (MAXIPIME) 2 g in sodium chloride  0.9 % 100 mL IVPB, 2 g, Intravenous, Q12H, Tat, Alm, MD, Stopped at 04/19/24 1032   Chlorhexidine  Gluconate Cloth 2 % PADS 6 each, 6 each, Topical, Q0600, Segars, Jonathan, MD, 6 each at 04/19/24 [id number]   doxycycline (VIBRAMYCIN) 100 mg in sodium chloride  0.9 % 250 mL IVPB, 100 mg, Intravenous, Q12H, Tat, Alm, MD, Stopped at 04/19/24 1242   fluticasone  (FLONASE ) 50 MCG/ACT nasal spray 1-2 spray, 1-2 spray, Each Nare, Daily PRN, Johnson, Clanford L, MD    fluticasone  furoate-vilanterol (BREO ELLIPTA ) 100-25 MCG/ACT 1 puff, 1 puff, Inhalation, Daily, Johnson, Clanford L, MD, 1 puff at 04/19/24 0847   hydrocortisone  sodium succinate (SOLU-CORTEF ) 100 MG injection 40 mg, 40 mg, Intravenous, Q12H, Johnson, Clanford L, MD, 40 mg at 04/19/24 0948   ipratropium-albuterol  (DUONEB) 0.5-2.5 (3) MG/3ML nebulizer solution 3 mL, 3 mL, Nebulization, Q6H PRN, Johnson, Clanford L, MD   latanoprost  (XALATAN ) 0.005 % ophthalmic solution 1 drop, 1 drop, Both Eyes, QHS, Johnson, Clanford L, MD, 1 drop at 04/18/24 2203   loperamide (IMODIUM) capsule 2 mg, 2 mg, Oral, BID PRN, Vicci, Clanford L, MD   midodrine (PROAMATINE) tablet 5 mg, 5 mg, Oral, TID WC, Tat, Alm, MD, 5 mg at 04/19/24 1145   mycophenolate  (CELLCEPT ) capsule 1,000 mg, 1,000 mg, Oral, BID, Emokpae, Ejiroghene E, MD, 1,000 mg at 04/19/24 0939   norepinephrine (LEVOPHED) 4mg  in (0.016 mg/mL) premix infusion, 0-10 mcg/min, Intravenous, Titrated, Segars, Dorn, MD, Stopped at 04/19/24 1146   omega-3 acid ethyl esters (LOVAZA ) capsule 1 g, 1 g, Oral, BID, Johnson, Clanford L, MD, 1 g at 04/19/24 9061   ondansetron  (ZOFRAN ) tablet 4 mg, 4 mg, Oral, Q6H PRN **OR** ondansetron  (ZOFRAN ) injection 4 mg, 4 mg, Intravenous, Q6H PRN, Emokpae, Ejiroghene E, MD, 4 mg at 04/17/24 9076   Oral care mouth rinse, 15 mL, Mouth Rinse, PRN, Tat, David, MD   oxyCODONE  (Oxy IR/ROXICODONE ) immediate release tablet 5-10 mg, 5-10 mg, Oral, Q6H PRN, Emokpae, Ejiroghene E, MD, 5 mg at 04/18/24  1825   pantoprazole (PROTONIX) injection 40 mg, 40 mg, Intravenous, Q12H, Melvenia Motto, MD, 40 mg at 04/19/24 0941   sodium chloride  flush (NS) 0.9 % injection 10-40 mL, 10-40 mL, Intracatheter, Q12H, Johnson, Clanford L, MD, 10 mL at 04/19/24 0941   sodium chloride  flush (NS) 0.9 % injection 10-40 mL, 10-40 mL, Intracatheter, PRN, Johnson, Clanford L, MD   sucralfate (CARAFATE) 1 GM/10ML suspension 1 g, 1 g, Oral, TID WC & HS, Johnson,  Clanford L, MD, 1 g at 04/19/24 1145   I/O    Intake/Output Summary (Last 24 hours) at 04/19/2024 1319 Last data filed at 04/19/2024 1300 Gross per 24 hour  Intake 1129.72 ml  Output 700 ml  Net 429.72 ml     Physical Exam: Temp:  [98 F (36.7 C)-98.4 F (36.9 C)] 98 F (36.7 C) (10/04 1136) Pulse Rate:  [52-147] 77 (10/04 1300) Resp:  [14-30] 21 (10/04 1300) BP: (89-155)/(29-84) 95/34 (10/04 1300) SpO2:  [44 %-100 %] 96 % (10/04 1300) FiO2 (%):  [21 %] 21 % (10/04 0847)  Temp (24hrs), Avg:98.2 F (36.8 C), Min:98 F (36.7 C), Max:98.4 F (36.9 C)  GENERAL: The patient is AO x3, in no acute distress. LUNGS: Clear to auscultation. No presence of rhonchi/wheezing/rales. Adequate chest expansion HEART: RRR, normal s1 and s2. ABDOMEN: Soft, nontender, no guarding, no peritoneal signs, and nondistended. BS +. No masses.   Laboratory Data: CBC:     Component Value Date/Time   WBC 18.4 (H) 04/19/2024 0522   RBC 2.56 (L) 04/19/2024 0522   HGB 8.4 (L) 04/19/2024 0522   HGB 13.1 02/26/2024 1010   HCT 25.5 (L) 04/19/2024 0522   HCT 40.7 02/26/2024 1010   PLT 190 04/19/2024 0522   PLT 172 02/26/2024 1010   MCV 99.6 04/19/2024 0522   MCV 109 (H) 02/26/2024 1010   MCH 32.8 04/19/2024 0522   MCHC 32.9 04/19/2024 0522   RDW 19.6 (H) 04/19/2024 0522   RDW 16.2 (H) 02/26/2024 1010   LYMPHSABS 2.1 04/18/2024 0521   MONOABS 0.4 04/18/2024 0521   EOSABS 0.0 04/18/2024 0521   BASOSABS 0.0 04/18/2024 0521   COAG:  Lab Results  Component Value Date   INR 1.1 04/14/2024   INR 1.0 04/06/2024   INR 1.1 09/08/2023    BMP:     Latest Ref Rng & Units 04/19/2024    5:22 AM 04/18/2024    5:21 AM 04/17/2024    4:35 AM  BMP  Glucose 70 - 99 mg/dL 855  857  826   BUN 8 - 23 mg/dL 32  42  59   Creatinine 0.61 - 1.24 mg/dL 9.01  8.97  8.70   Sodium 135 - 145 mmol/L 138  139  140   Potassium 3.5 - 5.1 mmol/L 4.0  3.9  3.9   Chloride 98 - 111 mmol/L 110  111  108   CO2 22 - 32 mmol/L  21  21  23    Calcium  8.9 - 10.3 mg/dL 7.9  7.8  7.7     HEPATIC:     Latest Ref Rng & Units 04/19/2024    5:22 AM 04/14/2024    2:22 PM 04/06/2024    4:58 PM  Hepatic Function  Total Protein 6.5 - 8.1 g/dL 3.9  4.7  5.6   Albumin  3.5 - 5.0 g/dL 2.7  2.6  3.3   AST 15 - 41 U/L 19  26  21    ALT 0 - 44 U/L 10  13  19   Alk Phosphatase 38 - 126 U/L 55  42  58   Total Bilirubin 0.0 - 1.2 mg/dL 1.2  1.7  1.3   Bilirubin, Direct 0.0 - 0.2 mg/dL 0.6       CARDIAC:  Lab Results  Component Value Date   CKTOTAL 28 12/04/2007   CKMB 0.9 12/04/2007   TROPONINI 0.02        NO INDICATION OF MYOCARDIAL INJURY. 12/04/2007      Imaging: I personally reviewed and interpreted the available labs, imaging and endoscopic files.   Assessment/Plan:  This is a 89 year old male with history of CHF, coronary artery disease, polymyalgia rheumatica and rheumatoid arthritis on chronic prednisone /CellCept , pulmonary fibrosis, recent femur fracture who presented with hematemesis and melena.  GI is following the patient for acute on chronic anemia with upper GI bleed due to Peptic ulcer disease  #PUD  Patient underwent upper endoscopy 04/15/2024 with Forrest 2C lesion status post bipolar cautery    Fortunately no evidence of any hematochezia/melena/or hematemesis in over last 48 hours   Patient underwent upper endoscopy 04/15/2024 with Forrest 2C lesion status post bipolar cautery    Hemoglobin has remained stable without any further transfusion requirements and BUN/creatinine has improved. Although has new leukocytosis  Patient has been off pressors for past 3 hours   Recs:  -continue PPI twice daily for total of 72 hours and switch to p.o. twice daily thereafter.    -Repeat surveillance upper endoscopy in 2 months given clinical condition   -Can advance diet   Please recall GI with any further questions  Ciel Yanes Faizan Ziyon Cedotal, MD Gastroenterology and Hepatology [facility]  Gastroenterology   This chart has been completed using [facility] Dictation software, and while attempts have been made to ensure accuracy , certain words and phrases may not be transcribed as intended

## 2024-04-19 NOTE — Plan of Care (Signed)
  Problem: Education: Goal: Knowledge of General Education information will improve Description: Including pain rating scale, medication(s)/side effects and non-pharmacologic comfort measures Outcome: Progressing   Problem: Nutrition: Goal: Adequate nutrition will be maintained Outcome: Progressing   Problem: Elimination: Goal: Will not experience complications related to urinary retention Outcome: Progressing   Problem: Pain Managment: Goal: General experience of comfort will improve and/or be controlled Outcome: Progressing

## 2024-04-19 NOTE — Plan of Care (Signed)
  Problem: Coping: Goal: Level of anxiety will decrease Outcome: Progressing   Problem: Elimination: Goal: Will not experience complications related to urinary retention Outcome: Progressing   Problem: Pain Managment: Goal: General experience of comfort will improve and/or be controlled Outcome: Progressing   Problem: Safety: Goal: Ability to remain free from injury will improve Outcome: Progressing

## 2024-04-20 DIAGNOSIS — K922 Gastrointestinal hemorrhage, unspecified: Secondary | ICD-10-CM | POA: Diagnosis not present

## 2024-04-20 DIAGNOSIS — R578 Other shock: Secondary | ICD-10-CM | POA: Diagnosis not present

## 2024-04-20 DIAGNOSIS — J849 Interstitial pulmonary disease, unspecified: Secondary | ICD-10-CM | POA: Diagnosis not present

## 2024-04-20 DIAGNOSIS — N179 Acute kidney failure, unspecified: Secondary | ICD-10-CM | POA: Diagnosis not present

## 2024-04-20 LAB — CBC
HCT: 23.5 % — ABNORMAL LOW (ref 39.0–52.0)
Hemoglobin: 7.5 g/dL — ABNORMAL LOW (ref 13.0–17.0)
MCH: 32.3 pg (ref 26.0–34.0)
MCHC: 31.9 g/dL (ref 30.0–36.0)
MCV: 101.3 fL — ABNORMAL HIGH (ref 80.0–100.0)
Platelets: 163 K/uL (ref 150–400)
RBC: 2.32 MIL/uL — ABNORMAL LOW (ref 4.22–5.81)
RDW: 20.3 % — ABNORMAL HIGH (ref 11.5–15.5)
WBC: 12.1 K/uL — ABNORMAL HIGH (ref 4.0–10.5)
nRBC: 0.5 % — ABNORMAL HIGH (ref 0.0–0.2)

## 2024-04-20 LAB — COMPREHENSIVE METABOLIC PANEL WITH GFR
ALT: 9 U/L (ref 0–44)
AST: 17 U/L (ref 15–41)
Albumin: 2.7 g/dL — ABNORMAL LOW (ref 3.5–5.0)
Alkaline Phosphatase: 58 U/L (ref 38–126)
Anion gap: 7 (ref 5–15)
BUN: 28 mg/dL — ABNORMAL HIGH (ref 8–23)
CO2: 20 mmol/L — ABNORMAL LOW (ref 22–32)
Calcium: 7.9 mg/dL — ABNORMAL LOW (ref 8.9–10.3)
Chloride: 112 mmol/L — ABNORMAL HIGH (ref 98–111)
Creatinine, Ser: 0.93 mg/dL (ref 0.61–1.24)
GFR, Estimated: >60 mL/min (ref >60)
Glucose, Bld: 127 mg/dL — ABNORMAL HIGH (ref 70–99)
Potassium: 4 mmol/L (ref 3.5–5.1)
Sodium: 139 mmol/L (ref 135–145)
Total Bilirubin: 1.1 mg/dL (ref 0.0–1.2)
Total Protein: 3.8 g/dL — ABNORMAL LOW (ref 6.5–8.1)

## 2024-04-20 LAB — GLUCOSE, CAPILLARY: Glucose-Capillary: 166 mg/dL — ABNORMAL HIGH (ref 70–99)

## 2024-04-20 NOTE — Progress Notes (Signed)
 PROGRESS NOTE  Jeffrey Stewart FMW:[id number] DOB: 08-10-1934 DOA: 04/14/2024 PCP: Anita Bernardino [name], FNP  Brief History:  89 y.o. male with medical history significant for dCHF, interstitial lung disease, RBBB, rheumatoid arthritis, CAD. Patient was brought to the ED via EMS with reports of vomiting of blood, and blood in his stools that started this morning.  Spouse is at bedside and confirms that patients vomitus contained golf sized blood clots.  He is not aware of having any black stools.  Reports some lower abdominal pain earlier today that has since resolved.  He has history of hemorrhoids which occasionally bleed, but not to this extent, he has never vomited blood.  He denies NSAID use.  EMS reports blood pressure transiently down to 80s and route.   Recently hospitalized 9/29 to 9/27 for hip fracture-underwent ORIF 03/2021.  Also with acute blood loss anemia postprocedure, requiring 1 unit PRBC.  Hemoglobin on discharge was 7.1.  Refused SNF and was discharged to home.  He is not on full dose anticoagulation.   ED Course: Temperature 98.2.  Tachycardic heart rate 94-116.  Respiratory rate 18- 35.  Blood pressure systolic 94-115.  O2 sat greater than 92% on room air. Hemoglobin 6.3.  WBC 28.2.  Lactic acid 3.9.  CTAP W OC-diarrheal state, correlation with clinical exam and stool cultures recommended.  EDP talked to Dr. Cindie, n.p.o., IV PPI twice daily, plan for EGD 9/30.   Assessment/Plan: Hemorrhagic Shock Acute blood loss anemia Multiple gastric ulcers  -- pt had blood BMs/hematemesis, transferred to ICU started on pressor support for hypotension -- pt had another blood BM 04/17/24 AM>>no more since then -- 5 units PRBC transfused this admisson -- he remains on levophed 1-2 mcg/kg/min -- 9/30 EGD--3x nonbleeding cratered gastric ulcers injected with epi and bipolar cautery -- continue pantoprazole 40 mg IV BID -- sucralfate suspension QID for 8 weeks -- avoid all NSAIDS--pt  was on ASA 81 mg bid PTA -- 10/2 CT GI bleed--Hyperdense enteric material or contrast within the cecum and ascending colon, Otherwise, no extravasation of contrast in the remainder of the GI tract to suggest active GI bleeding. -- wean IV norepinephrine - added midodrine, already on stress steroids -- restarted clears 10/3>>full liquids on 10/4>>soft diet on 10/5 -- given albumin  x 1 10/4   Acute on CKD stage 3a Lactic acidosis Hyperkalemia -- treated with IV dextrose  and insulin initially -- given lokelma 10 g 2 doses -- baseline creatinine 0.9-1.2 -- initially fluid resuscitated -- renal US --negative hydronephrosis -- lactic acidosis trending down with IV fluid and improving low blood pressure -- overall improved   Leukemoid reaction -- multifactorial including steroids, stress demargination, transfusions --9/29 and 10/2 blood cultures neg -- 9/29 UA--no pyuria --overall improved --pt is on IV steroids; remains afebrile   New irregular nodular density seen in right upper lobe  -- 10/1 CT chest--irregular 1.0x 2.2 cm RUL opacity, 5x8mm LLL nodule -- PCT 0.79 -- treat empirically with abx as aspiration/PNA -- outpt follow up with pulmonary and surveillance CT -- pt and family informed of plan    Glaucoma -- resumed home eye drops   Chronic HFpEF -2/923 Echo EF 50%, G1DD, mod decrease RVF -holding torsemide  temporarily   Right hip fracture- 9/29 to 9/27 for hip fracture-underwent ORIF 03/2021. Right thigh has extensive ecchymosis, likely contributed to the anemia. - Hold aspirin  and all anticoagulation    Stage I decubitus ulcer -- skin care protocol  Rheumatoid arthritis/interstitial lung disease- On chronic prednisone  5 mg a.m. and 1 mg p.m. and mycophenolate . - Resume mycophenolate  - remains on stress dose steroids        Family Communication:   spouse/DTR at bedside 10/5   Consultants:  GI   Code Status:  FULL   DVT Prophylaxis:  SCDs     Procedures: As  Listed in Progress Note Above   Antibiotics: Cefepime 10/2>> Doxy 10/2>>             Subjective: Patient denies fevers, chills, headache, chest pain, dyspnea, nausea, vomiting, diarrhea, abdominal pain, dysuria, hematuria, hematochezia, and melena.   Objective: Vitals:   04/20/24 1600 04/20/24 1607 04/20/24 1700 04/20/24 1809  BP: (!) 119/40  126/75 136/76  Pulse: 84  93 100  Resp: (!) 22  (!) 21 20  Temp:  97.6 F (36.4 C)  98.4 F (36.9 C)  TempSrc:  Oral  Oral  SpO2: 98%  98% 100%  Weight:      Height:        Intake/Output Summary (Last 24 hours) at 04/20/2024 1832 Last data filed at 04/20/2024 1300 Gross per 24 hour  Intake 850.11 ml  Output 750 ml  Net 100.11 ml   Weight change:  Exam:  General:  Pt is alert, follows commands appropriately, not in acute distress HEENT: No icterus, No thrush, No neck mass, Brooks/AT Cardiovascular: RRR, S1/S2, no rubs, no gallops Respiratory: CTA bilaterally, no wheezing, no crackles, no rhonchi Abdomen: Soft/+BS, non tender, non distended, no guarding Extremities: 1 + LE edema, No lymphangitis, No petechiae, No rashes, no synovitis   Data Reviewed: I have personally reviewed following labs and imaging studies Basic Metabolic Panel: Recent Labs  Lab 04/15/24 0339 04/15/24 1154 04/16/24 0506 04/17/24 0435 04/18/24 0521 04/19/24 0522 04/20/24 0549  NA 138   < > 139 140 139 138 139  K 5.7*   < > 4.4 3.9 3.9 4.0 4.0  CL 104   < > 108 108 111 110 112*  CO2 20*   < > 20* 23 21* 21* 20*  GLUCOSE 212*   < > 162* 173* 142* 144* 127*  BUN 87*   < > 81* 59* 42* 32* 28*  CREATININE 2.16*   < > 1.80* 1.29* 1.02 0.98 0.93  CALCIUM  7.4*   < > 7.4* 7.7* 7.8* 7.9* 7.9*  MG 2.4  --   --  2.6*  --   --   --   PHOS 4.7*  --   --   --   --   --   --    < > = values in this interval not displayed.   Liver Function Tests: Recent Labs  Lab 04/14/24 1422 04/19/24 0522 04/20/24 0549  AST 26 19 17   ALT 13 10 9   ALKPHOS 42 55 58   BILITOT 1.7* 1.2 1.1  PROT 4.7* 3.9* 3.8*  ALBUMIN  2.6* 2.7* 2.7*   Recent Labs  Lab 04/14/24 1422  LIPASE 30   No results for input(s): AMMONIA in the last 168 hours. Coagulation Profile: Recent Labs  Lab 04/14/24 1422  INR 1.1   CBC: Recent Labs  Lab 04/14/24 1422 04/14/24 2040 04/15/24 1154 04/16/24 0506 04/16/24 1214 04/17/24 0435 04/17/24 1418 04/18/24 0521 04/19/24 0522 04/20/24 0549  WBC 28.2*   < > 25.1* 17.7*  --  13.9*  --  11.9* 18.4* 12.1*  NEUTROABS 25.9*  --  19.1* 13.6*  --  10.4*  --  8.4*  --   --  HGB 6.3*   < > 9.5* 7.8*   < > 7.5* 8.1* 8.0* 8.4* 7.5*  HCT 19.7*   < > 28.5* 24.6*   < > 23.6* 24.9* 24.9* 25.5* 23.5*  MCV 113.2*   < > 95.6 98.4  --  99.6  --  100.0 99.6 101.3*  PLT 274   < > 239 188  --  192  --  171 190 163   < > = values in this interval not displayed.   Cardiac Enzymes: No results for input(s): CKTOTAL, CKMB, CKMBINDEX, TROPONINI in the last 168 hours. BNP: Invalid input(s): POCBNP CBG: Recent Labs  Lab 04/14/24 2210 04/15/24 0541 04/15/24 1342 04/17/24 1614 04/20/24 1604  GLUCAP 175* 221* 154* 145* 166*   HbA1C: No results for input(s): HGBA1C in the last 72 hours. Urine analysis:    Component Value Date/Time   COLORURINE YELLOW 04/14/2024 2215   APPEARANCEUR HAZY (A) 04/14/2024 2215   LABSPEC 1.020 04/14/2024 2215   PHURINE 5.0 04/14/2024 2215   GLUCOSEU NEGATIVE 04/14/2024 2215   HGBUR NEGATIVE 04/14/2024 2215   BILIRUBINUR NEGATIVE 04/14/2024 2215   KETONESUR NEGATIVE 04/14/2024 2215   PROTEINUR NEGATIVE 04/14/2024 2215   UROBILINOGEN 1.0 09/24/2013 0905   NITRITE NEGATIVE 04/14/2024 2215   LEUKOCYTESUR NEGATIVE 04/14/2024 2215   Sepsis Labs: @LABRCNTIP (procalcitonin:4,lacticidven:4) ) Recent Results (from the past 240 hours)  Resp panel by RT-PCR (RSV, Flu A&B, Covid) Anterior Nasal Swab     Status: None   Collection Time: 04/14/24  2:03 PM   Specimen: Anterior Nasal Swab  Result  Value Ref Range Status   SARS Coronavirus 2 by RT PCR NEGATIVE NEGATIVE Final    Comment: (NOTE) SARS-CoV-2 target nucleic acids are NOT DETECTED.  The SARS-CoV-2 RNA is generally detectable in upper respiratory specimens during the acute phase of infection. The lowest concentration of SARS-CoV-2 viral copies this assay can detect is 138 copies/mL. A negative result does not preclude SARS-Cov-2 infection and should not be used as the sole basis for treatment or other patient management decisions. A negative result may occur with  improper specimen collection/handling, submission of specimen other than nasopharyngeal swab, presence of viral mutation(s) within the areas targeted by this assay, and inadequate number of viral copies(<138 copies/mL). A negative result must be combined with clinical observations, patient history, and epidemiological information. The expected result is Negative.  Fact Sheet for Patients:  BloggerCourse.com  Fact Sheet for Healthcare Providers:  SeriousBroker.it  This test is no t yet approved or cleared by the [place]  FDA and  has been authorized for detection and/or diagnosis of SARS-CoV-2 by FDA under an Emergency Use Authorization (EUA). This EUA will remain  in effect (meaning this test can be used) for the duration of the COVID-19 declaration under Section 564(b)(1) of the Act, 21 U.S.C.section 360bbb-3(b)(1), unless the authorization is terminated  or revoked sooner.       Influenza A by PCR NEGATIVE NEGATIVE Final   Influenza B by PCR NEGATIVE NEGATIVE Final    Comment: (NOTE) The Xpert Xpress SARS-CoV-2/FLU/RSV plus assay is intended as an aid in the diagnosis of influenza from Nasopharyngeal swab specimens and should not be used as a sole basis for treatment. Nasal washings and aspirates are unacceptable for Xpert Xpress SARS-CoV-2/FLU/RSV testing.  Fact Sheet for  Patients: BloggerCourse.com  Fact Sheet for Healthcare Providers: SeriousBroker.it  This test is not yet approved or cleared by the [place]  FDA and has been authorized for detection and/or diagnosis of SARS-CoV-2 by  FDA under an Emergency Use Authorization (EUA). This EUA will remain in effect (meaning this test can be used) for the duration of the COVID-19 declaration under Section 564(b)(1) of the Act, 21 U.S.C. section 360bbb-3(b)(1), unless the authorization is terminated or revoked.     Resp Syncytial Virus by PCR NEGATIVE NEGATIVE Final    Comment: (NOTE) Fact Sheet for Patients: BloggerCourse.com  Fact Sheet for Healthcare Providers: SeriousBroker.it  This test is not yet approved or cleared by the [place]  FDA and has been authorized for detection and/or diagnosis of SARS-CoV-2 by FDA under an Emergency Use Authorization (EUA). This EUA will remain in effect (meaning this test can be used) for the duration of the COVID-19 declaration under Section 564(b)(1) of the Act, 21 U.S.C. section 360bbb-3(b)(1), unless the authorization is terminated or revoked.  Performed at [facility], [address], [place], [place] [address]   Culture, blood (Routine X 2) w Reflex to ID Panel     Status: None   Collection Time: 04/14/24  9:26 PM   Specimen: BLOOD  Result Value Ref Range Status   Specimen Description BLOOD BLOOD RIGHT HAND  Final   Special Requests NONE  Final   Culture   Final    NO GROWTH 5 DAYS Performed at [facility], [address], [place], [place] [address]    Report Status 04/19/2024 FINAL  Final  Culture, blood (Routine X 2) w Reflex to ID Panel     Status: None   Collection Time: 04/14/24  9:26 PM   Specimen: BLOOD  Result Value Ref Range Status   Specimen Description BLOOD BLOOD LEFT HAND  Final   Special Requests NONE  Final   Culture   Final     NO GROWTH 5 DAYS Performed at [facility], [address] [place], [place] [address]    Report Status 04/19/2024 FINAL  Final  MRSA Next Gen by PCR, Nasal     Status: Abnormal   Collection Time: 04/14/24  9:39 PM   Specimen: Nasal Mucosa; Nasal Swab  Result Value Ref Range Status   MRSA by PCR Next Gen DETECTED (A) NOT DETECTED Final    Comment: RESULT CALLED TO, READ BACK BY AND VERIFIED WITH: R ESOCE,RN@0014  04/15/24 MK (NOTE) The GeneXpert MRSA Assay (FDA approved for NASAL specimens only), is one component of a comprehensive MRSA colonization surveillance program. It is not intended to diagnose MRSA infection nor to guide or monitor treatment for MRSA infections. Test performance is not FDA approved in patients less than 75 years old. Performed at [facility], [address], [place], [place] [address]   Culture, blood (Routine X 2) w Reflex to ID Panel     Status: None (Preliminary result)   Collection Time: 04/17/24  9:52 AM   Specimen: BLOOD  Result Value Ref Range Status   Specimen Description BLOOD BLOOD LEFT ARM  Final   Special Requests   Final    BOTTLES DRAWN AEROBIC AND ANAEROBIC Blood Culture adequate volume   Culture   Final    NO GROWTH 3 DAYS Performed at [facility], [address], [place], [place] [address]    Report Status PENDING  Incomplete  Culture, blood (Routine X 2) w Reflex to ID Panel     Status: None (Preliminary result)   Collection Time: 04/17/24  9:52 AM   Specimen: BLOOD  Result Value Ref Range Status   Specimen Description BLOOD BLOOD RIGHT HAND AEROBIC BOTTLE ONLY  Final  Special Requests   Final    Blood Culture results may not be optimal due to an inadequate volume of blood received in culture bottles   Culture   Final    NO GROWTH 3 DAYS Performed at [facility], [address], [place], [place] [address]    Report Status PENDING  Incomplete     Scheduled Meds:  atorvastatin   40 mg Oral QHS   brimonidine   1 drop Both Eyes  BID   Chlorhexidine  Gluconate Cloth  6 each Topical Q0600   diclofenac Sodium  4 g Topical QID   fluticasone  furoate-vilanterol  1 puff Inhalation Daily   hydrocortisone  sod succinate (SOLU-CORTEF ) inj  40 mg Intravenous Q12H   latanoprost   1 drop Both Eyes QHS   midodrine  5 mg Oral TID WC   mycophenolate   1,000 mg Oral BID   omega-3 acid ethyl esters  1 g Oral BID   pantoprazole  40 mg Intravenous Q12H   sodium chloride  flush  10-40 mL Intracatheter Q12H   sucralfate  1 g Oral TID WC & HS   Continuous Infusions:  ceFEPime (MAXIPIME) IV Stopped (04/20/24 0948)   doxycycline (VIBRAMYCIN) IV Stopped (04/20/24 1200)   norepinephrine (LEVOPHED) Adult infusion Stopped (04/19/24 1146)    Procedures/Studies: CT ANGIO GI BLEED Result Date: 04/17/2024 CLINICAL DATA:  persistent hematochezia, blood loss anemia EXAM: CTA ABDOMEN AND PELVIS WITHOUT AND WITH CONTRAST TECHNIQUE: Initially, noncontrast CT of the abdomen and pelvis were performed. Subsequently, Multidetector CT imaging of the abdomen and pelvis was performed using the standard protocol during bolus administration of intravenous contrast. Multiplanar reconstructed images and MIPs were obtained and reviewed to evaluate the vascular anatomy. RADIATION DOSE REDUCTION: This exam was performed according to the departmental dose-optimization program which includes automated exposure control, adjustment of the mA and/or kV according to patient size and/or use of iterative reconstruction technique. CONTRAST:  80mL OMNIPAQUE  IOHEXOL  350 MG/ML SOLN COMPARISON:  04/14/2024 FINDINGS: VASCULAR Aorta: No aortic aneurysm or dissection. Diffuse aortic atherosclerosis. No hemodynamically significant stenosis. Celiac: Patent without acute thrombus, aneurysm, or dissection.No hemodynamically significant stenosis. SMA: Patent without acute thrombus, aneurysm, or dissection.No hemodynamically significant stenosis. Renals: Patent without acute thrombus, aneurysm, or  dissection.Mild stenosis of both renal artery ostia from calcified plaque. IMA: Patent without acute thrombus, aneurysm, or dissection.Moderate narrowing of the IMA ostium from calcified plaque. Inflow: Patent without acute thrombus, aneurysm, or dissection.Diffuse calcified atherosclerosis throughout the inflow vessels without hemodynamically significant stenosis. Proximal Outflow: The bilateral common femoral and visualized portions of the superficial and profunda femoral arteries are patent without acute thrombus, aneurysm, or dissection.No hemodynamically significant stenosis. Veins: No obvious venous abnormality within the limitations of this arterial phase study. Review of the MIP images confirms the above findings. NON-VASCULAR Lower chest: No focal airspace consolidation or pleural effusion.Posterior bibasilar dependent atelectasis. Multifocal scarring also present in the lung bases. Hepatobiliary: No mass.No radiopaque stones or wall thickening of the gallbladder.No intrahepatic or extrahepatic biliary ductal dilation. Pancreas: No mass or main ductal dilation.No peripancreatic inflammation or fluid collection. Spleen: Normal size. No mass. Adrenals/Urinary Tract: No adrenal masses. No renal mass. No hydronephrosis or nephrolithiasis. The urinary bladder is distended without focal abnormality. Stomach/Bowel: The stomach is decompressed without focal abnormality. No small bowel wall thickening or inflammation. No small bowel obstruction.The appendix was not visualized. No right lower quadrant or pericecal inflammatory changes to suggest acute appendicitis. Hyperdense enteric material or contrast within the cecum and ascending colon. Scattered colonic diverticulosis. GI Bleed: No extravasation  of contrast to suggest active GI bleeding. Lymphatic: No intraabdominal or pelvic lymphadenopathy. Reproductive: No prostatomegaly.No free pelvic fluid. Other: No pneumoperitoneum, ascites, or mesenteric inflammation.  Bilateral inguinal hernia repairs. Musculoskeletal: No acute fracture or destructive lesion.Partially visualized right femoral intramedullary nail with persistent fracture of the right intertrochanteric femoral neck. Likely postsurgical edema along the right hip. Multilevel degenerative disc disease of the spine. IMPRESSION: VASCULAR 1. No aortic aneurysm, intramural hematoma, or aortic dissection. 2. Hyperdense enteric material or contrast within the cecum and ascending colon, which limits evaluation for GI bleed in this region. Otherwise, no extravasation of contrast in the remainder of the GI tract to suggest active GI bleeding. NON-VASCULAR No additional, acute findings within the abdomen or pelvis. Aortic Atherosclerosis (ICD10-I70.0). Electronically Signed   By: Rogelia Myers M.D.   On: 04/17/2024 09:58   CT CHEST WO CONTRAST Result Date: 04/16/2024 CLINICAL DATA:  Respiratory illness, nondiagnostic xray RUL opacity on CXR. EXAM: CT CHEST WITHOUT CONTRAST TECHNIQUE: Multidetector CT imaging of the chest was performed following the standard protocol without IV contrast. RADIATION DOSE REDUCTION: This exam was performed according to the departmental dose-optimization program which includes automated exposure control, adjustment of the mA and/or kV according to patient size and/or use of iterative reconstruction technique. COMPARISON:  Chest radiograph from 04/15/2024. FINDINGS: Cardiovascular: Normal cardiac size. No pericardial effusion. No aortic aneurysm. There are coronary artery calcifications, in keeping with coronary artery disease. There are also moderate peripheral atherosclerotic vascular calcifications of thoracic aorta and its major branches. Aberrant origin of right subclavian artery noted, which arises distal to the left subclavian artery and courses towards the right side, posterior to the esophagus. Mediastinum/Nodes: Visualized thyroid gland appears grossly unremarkable. No solid / cystic  mediastinal masses. The esophagus is nondistended precluding optimal assessment. There are few mildly prominent mediastinal lymph nodes, which do not meet the size criteria for lymphadenopathy and appear grossly similar to the prior study, favoring benign etiology. No axillary lymphadenopathy by size criteria. Evaluation of bilateral hila is limited due to lack on intravenous contrast: however, no large hilar lymphadenopathy identified. Lungs/Pleura: The central tracheo-bronchial tree is patent. There is a new irregular solid noncalcified 1.0 x 2.2 cm opacity in the subpleural right upper lobe. There are several additional linear areas of scarring/atelectasis throughout bilateral lungs, which are grossly similar to the prior study. No lung collapse, pleural effusion or pneumothorax. No pulmonary edema. There is a 5 x 5 mm ground-glass nodule in the left lung lower lobe, which is also new since the prior study. Upper Abdomen: There is small sliding hiatal hernia. Visualized upper abdominal viscera within normal limits. Musculoskeletal: Right-sided PICC line noted with its tip in the cavo-atrial junction region. Visualized soft tissues of the chest wall are otherwise grossly unremarkable. No suspicious osseous lesions. There are mild multilevel degenerative changes in the visualized spine. IMPRESSION: 1. There is a new irregular solid noncalcified 1.0 x 2.2 cm opacity in the subpleural right upper lobe. This opacity corresponds to the density described on the recent chest radiograph. This is suspicious. Pulmonary consultation is recommended. 2. There is also a new 5 x 5 mm ground-glass nodule in the left lung lower lobe. This is also indeterminate and differential diagnosis includes infectious/inflammatory etiology versus neoplastic process. 3. Multiple other nonacute observations, as described above. Aortic Atherosclerosis (ICD10-I70.0). Electronically Signed   By: Ree Molt M.D.   On: 04/16/2024 16:44   US   RENAL Result Date: 04/16/2024 CLINICAL DATA:  Acute kidney injury. EXAM: RENAL /  URINARY TRACT ULTRASOUND COMPLETE COMPARISON:  None Available. FINDINGS: Right Kidney: Renal measurements: 10.9 x 5.2 x 5.3 cm = volume: 156 mL. Increased cortical echogenicity. Mild cortical atrophy. No hydronephrosis. Left Kidney: Renal measurements: 13.3 x 5.9 x 5.5 cm = volume: 225 mL. Mildly increased cortical echogenicity. No hydronephrosis. Bladder: Appears normal for degree of bladder distention. Other: None. IMPRESSION: Increased cortical echogenicity of both kidneys consistent with chronic kidney disease. No evidence of hydronephrosis. Electronically Signed   By: Marcey Moan M.D.   On: 04/16/2024 14:09   DG Chest Portable 1 View Result Date: 04/15/2024 CLINICAL DATA:  PICC placement. EXAM: PORTABLE CHEST 1 VIEW COMPARISON:  April 14, 2024. FINDINGS: Stable cardiomediastinal silhouette. Right-sided PICC line is noted with distal tip in expected position of the SVC. Minimal bibasilar subsegmental atelectasis or scarring is noted with probable left basilar pleural thickening. New irregular nodular density seen in right upper lobe; CT scan of the chest is recommended for further evaluation. IMPRESSION: 1. Right-sided PICC line is noted with distal tip in expected position of the SVC. 2. New irregular nodular density seen in right upper lobe; CT scan of the chest is recommended for further evaluation. Electronically Signed   By: Lynwood Landy Raddle M.D.   On: 04/15/2024 17:10   US  EKG SITE RITE Result Date: 04/15/2024 If Site Rite image not attached, placement could not be confirmed due to current cardiac rhythm.  DG Chest Port 1 View Result Date: 04/14/2024 CLINICAL DATA:  Questionable sepsis. EXAM: PORTABLE CHEST 1 VIEW COMPARISON:  Chest radiograph dated 04/06/2024 FINDINGS: Shallow inspiration with bibasilar atelectasis. No consolidative changes. No pleural effusion pneumothorax. Stable cardiac silhouette. No  acute osseous pathology. IMPRESSION: Shallow inspiration with bibasilar atelectasis. No focal consolidation. Electronically Signed   By: Vanetta Chou M.D.   On: 04/14/2024 17:30   CT ABDOMEN PELVIS WO CONTRAST Result Date: 04/14/2024 CLINICAL DATA:  Abdominal pain.  Hematemesis. EXAM: CT ABDOMEN AND PELVIS WITHOUT CONTRAST TECHNIQUE: Multidetector CT imaging of the abdomen and pelvis was performed following the standard protocol without IV contrast. RADIATION DOSE REDUCTION: This exam was performed according to the departmental dose-optimization program which includes automated exposure control, adjustment of the mA and/or kV according to patient size and/or use of iterative reconstruction technique. COMPARISON:  Abdominal ultrasound dated 11/04/2021. FINDINGS: Evaluation of this exam is limited in the absence of intravenous contrast. Lower chest: Bibasilar subpleural atelectasis/scarring. There is coronary vascular calcification. No intra-abdominal free air or free fluid. Hepatobiliary: The liver is unremarkable. No biliary dilatation. The gallbladder is unremarkable. Pancreas: Unremarkable. No pancreatic ductal dilatation or surrounding inflammatory changes. Spleen: Normal in size without focal abnormality. Adrenals/Urinary Tract: The adrenal glands unremarkable. There is no hydronephrosis or nephrolithiasis on either side. Bilateral perinephric stranding, nonspecific. Correlation with urinalysis recommended to exclude UTI. The visualized ureters and urinary bladder appear unremarkable. Stomach/Bowel: There is loose stool throughout the colon suggestive of diarrheal state. Correlation with clinical exam and stool cultures recommended. There is no bowel obstruction or active inflammation. Appendectomy. Vascular/Lymphatic: Moderate aortoiliac atherosclerotic disease. The IVC is unremarkable. No portal venous gas. There is no adenopathy. Reproductive: The prostate and seminal vesicles are grossly unremarkable.  Other: Small fat containing bilateral inguinal hernias. Anterior pelvic wall hernia repair mesh. Musculoskeletal: Osteopenia with degenerative changes of the spine. Status post ORIF of right femoral neck fracture. There is edema in the surrounding soft tissues of the right hip. No fluid collection. IMPRESSION: 1. Diarrheal state. Correlation with clinical exam and stool cultures recommended. No  bowel obstruction. 2. No hydronephrosis or nephrolithiasis. 3.  Aortic Atherosclerosis (ICD10-I70.0). Electronically Signed   By: Vanetta Chou M.D.   On: 04/14/2024 17:29   DG HIP UNILAT WITH PELVIS 2-3 VIEWS RIGHT Result Date: 04/07/2024 CLINICAL DATA:  Postop internal fixation EXAM: DG HIP (WITH OR WITHOUT PELVIS) 2-3V RIGHT COMPARISON:  04/06/2024 FINDINGS: Changes of internal fixation across the right femoral intertrochanteric fracture. Anatomic alignment. No hardware complicating feature. IMPRESSION: Internal fixation.  No hardware complicating feature. Electronically Signed   By: Franky Crease M.D.   On: 04/07/2024 23:15   DG HIP UNILAT WITH PELVIS 2-3 VIEWS RIGHT Result Date: 04/07/2024 CLINICAL DATA:  Right hip internal fixation. EXAM: DG HIP (WITH OR WITHOUT PELVIS) 2-3V RIGHT COMPARISON:  04/06/2024 FINDINGS: Multiple intraoperative spot images demonstrate internal fixation across the right femoral intertrochanteric fracture. No hardware complicating feature. IMPRESSION: Internal fixation with anatomic alignment.  No complicating feature. Electronically Signed   By: Franky Crease M.D.   On: 04/07/2024 23:07   DG C-Arm 1-60 Min-No Report Result Date: 04/07/2024 Fluoroscopy was utilized by the requesting physician.  No radiographic interpretation.   DG Chest Port 1 View Result Date: 04/06/2024 CLINICAL DATA:  Pre-surgical. EXAM: RIGHT KNEE - 1-2 VIEW; PORTABLE CHEST - 1 VIEW COMPARISON:  09/08/2023, 04/06/2024. FINDINGS: Right knee: No acute fracture or dislocation is seen. There is a bony exostosis  along the lateral aspect of the mid femur mild to moderate tricompartmental degenerative changes are noted at the knee. There is a trace joint effusion. Vascular calcifications are present in the soft tissues. Chest: The heart is enlarged and the mediastinal contour is within normal limits for supine positioning. Atherosclerotic calcification of the aorta is noted. Lung volumes are low with atelectasis or infiltrate at the lung bases. There small bilateral pleural effusions. No pneumothorax is seen. No acute osseous abnormality. IMPRESSION: 1. No acute fracture or dislocation at the right knee. 2. Small bilateral pleural effusions with atelectasis or infiltrate at the lung bases. 3. Cardiomegaly. Electronically Signed   By: Leita Birmingham M.D.   On: 04/06/2024 17:29   DG Knee 1-2 Views Right Result Date: 04/06/2024 CLINICAL DATA:  Pre-surgical. EXAM: RIGHT KNEE - 1-2 VIEW; PORTABLE CHEST - 1 VIEW COMPARISON:  09/08/2023, 04/06/2024. FINDINGS: Right knee: No acute fracture or dislocation is seen. There is a bony exostosis along the lateral aspect of the mid femur mild to moderate tricompartmental degenerative changes are noted at the knee. There is a trace joint effusion. Vascular calcifications are present in the soft tissues. Chest: The heart is enlarged and the mediastinal contour is within normal limits for supine positioning. Atherosclerotic calcification of the aorta is noted. Lung volumes are low with atelectasis or infiltrate at the lung bases. There small bilateral pleural effusions. No pneumothorax is seen. No acute osseous abnormality. IMPRESSION: 1. No acute fracture or dislocation at the right knee. 2. Small bilateral pleural effusions with atelectasis or infiltrate at the lung bases. 3. Cardiomegaly. Electronically Signed   By: Leita Birmingham M.D.   On: 04/06/2024 17:29   DG Hip Unilat W or Wo Pelvis 2-3 Views Right Result Date: 04/06/2024 CLINICAL DATA:  Fall with diffuse pain. EXAM: DG HIP (WITH  OR WITHOUT PELVIS) 2-3V RIGHT COMPARISON:  None Available. FINDINGS: There is diffusely decreased mineralization of the bones. There is a nondisplaced intratrochanteric fracture of the proximal right femur. The remaining bony structures are intact and there is no dislocation. Degenerative changes are present at the bilateral hips, sacroiliac joints and  lower lumbar spine. Vascular calcifications are seen in the soft tissues. IMPRESSION: Minimally displaced intertrochanteric fracture of the proximal right femur. Electronically Signed   By: Leita Birmingham M.D.   On: 04/06/2024 16:22   CT Head Wo Contrast Result Date: 04/06/2024 CLINICAL DATA:  Fall with head injury. EXAM: CT HEAD WITHOUT CONTRAST CT CERVICAL SPINE WITHOUT CONTRAST TECHNIQUE: Multidetector CT imaging of the head and cervical spine was performed following the standard protocol without intravenous contrast. Multiplanar CT image reconstructions of the cervical spine were also generated. RADIATION DOSE REDUCTION: This exam was performed according to the departmental dose-optimization program which includes automated exposure control, adjustment of the mA and/or kV according to patient size and/or use of iterative reconstruction technique. COMPARISON:  CT cervical spine 07/21/2017. FINDINGS: CT HEAD FINDINGS Brain: Ventricles, cisterns and other CSF spaces are normal. Minimal chronic ischemic microvascular disease. No mass, mass effect, shift of midline structures or acute hemorrhage. No evidence of acute infarction. Vascular: No hyperdense vessel or unexpected calcification. Skull: Normal. Negative for fracture or focal lesion. Sinuses/Orbits: Orbits are normal. Minimal opacification over the right sphenoid sinus compatible mild chronic inflammatory change. Other: None. CT CERVICAL SPINE FINDINGS Alignment: Subtle anterior stairstep subluxations over the mid to lower cervical spine unchanged. No posttraumatic subluxation. Skull base and vertebrae:  Vertebral body heights are normal. There is mild to moderate spondylosis throughout the cervical spine to include uncovertebral joint spurring and facet arthropathy. Mild to moderate left-sided neural foraminal narrowing at the C2-3 level. Mild bilateral neural foraminal narrowing at the C3-4 level and moderate right-sided neural foraminal narrowing at the C4-5 level. Minimal left-sided neural foraminal narrowing at the C5-6 level. No acute fracture. Prevertebral soft tissues are normal. Soft tissues and spinal canal: No spinal canal abnormality. Prevertebral soft tissues normal. Disc levels: Disc space narrowing at the C5-6 and C6-7 levels. Also disc space narrowing at the C7-T1 level. Upper chest: No acute findings. Other: Oval mass over the posterior aspect of the right parotid gland measuring 1.2 cm in short axis with significant enlargement compared to the prior exam. IMPRESSION: 1. No acute brain injury. 2. Minimal chronic ischemic microvascular disease. 3. No acute cervical spine injury. 4. Mild to moderate spondylosis throughout the cervical spine with disc disease at the C5-6, C6-7 and C7-T1 levels. Multilevel neural foraminal narrowing as described. 5. 1.2 cm oval mass over the posterior aspect of the right parotid gland with significant enlargement compared to the prior exam. This may represent an enlarged lymph node versus a primary parotid neoplasm. Consider MRI with without contrast for further evaluation. Electronically Signed   By: Toribio Agreste M.D.   On: 04/06/2024 16:15   CT Cervical Spine Wo Contrast Result Date: 04/06/2024 CLINICAL DATA:  Fall with head injury. EXAM: CT HEAD WITHOUT CONTRAST CT CERVICAL SPINE WITHOUT CONTRAST TECHNIQUE: Multidetector CT imaging of the head and cervical spine was performed following the standard protocol without intravenous contrast. Multiplanar CT image reconstructions of the cervical spine were also generated. RADIATION DOSE REDUCTION: This exam was  performed according to the departmental dose-optimization program which includes automated exposure control, adjustment of the mA and/or kV according to patient size and/or use of iterative reconstruction technique. COMPARISON:  CT cervical spine 07/21/2017. FINDINGS: CT HEAD FINDINGS Brain: Ventricles, cisterns and other CSF spaces are normal. Minimal chronic ischemic microvascular disease. No mass, mass effect, shift of midline structures or acute hemorrhage. No evidence of acute infarction. Vascular: No hyperdense vessel or unexpected calcification. Skull: Normal. Negative for fracture or  focal lesion. Sinuses/Orbits: Orbits are normal. Minimal opacification over the right sphenoid sinus compatible mild chronic inflammatory change. Other: None. CT CERVICAL SPINE FINDINGS Alignment: Subtle anterior stairstep subluxations over the mid to lower cervical spine unchanged. No posttraumatic subluxation. Skull base and vertebrae: Vertebral body heights are normal. There is mild to moderate spondylosis throughout the cervical spine to include uncovertebral joint spurring and facet arthropathy. Mild to moderate left-sided neural foraminal narrowing at the C2-3 level. Mild bilateral neural foraminal narrowing at the C3-4 level and moderate right-sided neural foraminal narrowing at the C4-5 level. Minimal left-sided neural foraminal narrowing at the C5-6 level. No acute fracture. Prevertebral soft tissues are normal. Soft tissues and spinal canal: No spinal canal abnormality. Prevertebral soft tissues normal. Disc levels: Disc space narrowing at the C5-6 and C6-7 levels. Also disc space narrowing at the C7-T1 level. Upper chest: No acute findings. Other: Oval mass over the posterior aspect of the right parotid gland measuring 1.2 cm in short axis with significant enlargement compared to the prior exam. IMPRESSION: 1. No acute brain injury. 2. Minimal chronic ischemic microvascular disease. 3. No acute cervical spine injury.  4. Mild to moderate spondylosis throughout the cervical spine with disc disease at the C5-6, C6-7 and C7-T1 levels. Multilevel neural foraminal narrowing as described. 5. 1.2 cm oval mass over the posterior aspect of the right parotid gland with significant enlargement compared to the prior exam. This may represent an enlarged lymph node versus a primary parotid neoplasm. Consider MRI with without contrast for further evaluation. Electronically Signed   By: Toribio Agreste M.D.   On: 04/06/2024 16:15    Alm Schneider, DO  [facility]  If 7PM-7AM, please contact night-coverage [email or web address] Password [email or web address] 04/20/2024, 6:32 PM   LOS: 6 days

## 2024-04-20 NOTE — Plan of Care (Signed)
  Problem: Acute Rehab PT Goals(only PT should resolve) Goal: Pt Will Go Supine/Side To Sit Outcome: Progressing Flowsheets (Taken 04/20/2024 1320) Pt will go Supine/Side to Sit: with minimal assist Goal: Patient Will Transfer Sit To/From Stand Outcome: Progressing Flowsheets (Taken 04/20/2024 1320) Patient will transfer sit to/from stand: with minimal assist Goal: Pt Will Transfer Bed To Chair/Chair To Bed Outcome: Progressing Flowsheets (Taken 04/20/2024 1320) Pt will Transfer Bed to Chair/Chair to Bed: with supervision Goal: Pt Will Ambulate Outcome: Progressing Flowsheets (Taken 04/20/2024 1320) Pt will Ambulate:  15 feet  with supervision  with least restrictive assistive device   Lacinda Fass, PT, DPT

## 2024-04-20 NOTE — Evaluation (Signed)
 Physical Therapy Evaluation Patient Details Name: Jeffrey Stewart MRN: [id number] DOB: 07/20/1934 Today's Date: 04/20/2024  History of Present Illness  Jeffrey Stewart is a 89 y.o. male with medical history significant for CHF, interstitial lung disease, RBBB, rheumatoid arthritis.  Patient was brought to the ED via EMS with reports of vomiting of blood, and blood in his stools that started this morning.  Spouse is at bedside and confirms that patients vomitus contained golf sized blood clots.  He is not aware of having any black stools.  Reports some lower abdominal pain earlier today that has since resolved.  He has history of hemorrhoids which occasionally bleed, but not to this extent, he has never vomited blood.  He denies NSAID use.  EMS reports blood pressure transiently down to 80s and route.     Recently hospitalized 9/29 to 9/27 for hip fracture-underwent ORIF 03/2021.  Also with acute blood loss anemia postprocedure, requiring 1 unit PRBC.  Hemoglobin on discharge was 7.1.  Refused SNF and was discharged to home.  He is not on anticoagulation.   Clinical Impression  Pt was agreeable to completing today's PT evaluation. He required modA with supine to sit transfers to assist with RLE mobility due to muscular weakness. Sit to stand transfers also required modA, but once standing minA was required with ambulation and step pivot transfers. However, ambulation was limited as his HR increased to 152 bpm once standing. He also experienced SOB when standing, but his SpO2 remained above 90%. RN was present and  observed his mobility and HR during standing. He was left in chair with call bell within reach and with family and RN present. Patient will benefit from continued skilled physical therapy in hospital and recommended venue below to increase strength, balance, endurance for safe ADLs and gait.         If plan is discharge home, recommend the following: A little help with walking and/or transfers;A  little help with bathing/dressing/bathroom;Assistance with cooking/housework;Assist for transportation;Help with stairs or ramp for entrance   Can travel by private vehicle   Yes    Equipment Recommendations None recommended by PT  Recommendations for Other Services       Functional Status Assessment Patient has had a recent decline in their functional status and demonstrates the ability to make significant improvements in function in a reasonable and predictable amount of time.     Precautions / Restrictions Precautions Precautions: Fall Recall of Precautions/Restrictions: Intact Restrictions Weight Bearing Restrictions Per Provider Order: No      Mobility  Bed Mobility Overal bed mobility: Needs Assistance Bed Mobility: Supine to Sit     Supine to sit: Mod assist, HOB elevated     General bed mobility comments: assist for RLE mobility; pt sleeps in recliner at home    Transfers Overall transfer level: Needs assistance Equipment used: Rolling walker (2 wheels) Transfers: Sit to/from Stand, Bed to chair/wheelchair/BSC Sit to Stand: Mod assist   Step pivot transfers: Min assist            Ambulation/Gait Ambulation/Gait assistance: Min assist Gait Distance (Feet): 6 Feet Assistive device: Rolling walker (2 wheels) Gait Pattern/deviations: Step-through pattern, Decreased stride length Gait velocity: decreased        Stairs            Wheelchair Mobility     Tilt Bed    Modified Rankin (Stroke Patients Only)       Balance Overall balance assessment: Needs assistance Sitting-balance support: No  upper extremity supported, Feet supported Sitting balance-Leahy Scale: Good Sitting balance - Comments: sitting EOB   Standing balance support: Bilateral upper extremity supported, During functional activity, Reliant on assistive device for balance Standing balance-Leahy Scale: Poor Standing balance comment: with RW                              Pertinent Vitals/Pain Pain Assessment Pain Assessment: No/denies pain    Home Living Family/patient expects to be discharged to:: Private residence Living Arrangements: Spouse/significant other Available Help at Discharge: Family;Available 24 hours/day Type of Home: House Home Access: Stairs to enter;Ramped entrance (He is having a ramp built and it is expected to be completed today (04/20/24)) Entrance Stairs-Rails: Right Entrance Stairs-Number of Steps: 3   Home Layout: One level Home Equipment: BSC/3in1;Grab bars - tub/shower;Rolling Walker (2 wheels);Tub bench;Cane - single point Additional Comments: Pt confirmed that his living situation is the same since his recent hospital admission, but the only change is that he is having a ramp built.    Prior Function Prior Level of Function : Independent/Modified Independent;Driving             Mobility Comments: SPC for all mobility ADLs Comments: Independent     Extremity/Trunk Assessment   Upper Extremity Assessment Upper Extremity Assessment: Overall WFL for tasks assessed    Lower Extremity Assessment Lower Extremity Assessment: Generalized weakness    Cervical / Trunk Assessment Cervical / Trunk Assessment: Other exceptions Cervical / Trunk Exceptions: Forward head posture with rounded shoulders  Communication   Communication Communication: No apparent difficulties    Cognition Arousal: Alert Behavior During Therapy: WFL for tasks assessed/performed   PT - Cognitive impairments: No apparent impairments                         Following commands: Intact       Cueing Cueing Techniques: Verbal cues     General Comments      Exercises     Assessment/Plan    PT Assessment Patient needs continued PT services  PT Problem List Decreased strength;Decreased activity tolerance;Decreased balance;Decreased mobility;Decreased knowledge of use of DME;Decreased knowledge of precautions        PT Treatment Interventions DME instruction;Gait training;Stair training;Functional mobility training;Therapeutic activities;Therapeutic exercise;Balance training;Patient/family education    PT Goals (Current goals can be found in the Care Plan section)  Acute Rehab PT Goals Patient Stated Goal: Pt would like to return home with HHPT. PT Goal Formulation: With patient Time For Goal Achievement: 05/02/24 Potential to Achieve Goals: Good    Frequency Min 3X/week     Co-evaluation               AM-PAC PT 6 Clicks Mobility  Outcome Measure Help needed turning from your back to your side while in a flat bed without using bedrails?: A Little Help needed moving from lying on your back to sitting on the side of a flat bed without using bedrails?: A Little Help needed moving to and from a bed to a chair (including a wheelchair)?: A Little Help needed standing up from a chair using your arms (e.g., wheelchair or bedside chair)?: A Little Help needed to walk in hospital room?: A Little Help needed climbing 3-5 steps with a railing? : Total 6 Click Score: 16    End of Session Equipment Utilized During Treatment: Gait belt Activity Tolerance: Patient tolerated treatment well Patient left: in  chair;with call bell/phone within reach;with family/visitor present;with nursing/sitter in room Nurse Communication: Mobility status PT Visit Diagnosis: Unsteadiness on feet (R26.81);Muscle weakness (generalized) (M62.81);Difficulty in walking, not elsewhere classified (R26.2)    Time: 9072-9042 PT Time Calculation (min) (ACUTE ONLY): 30 min   Charges:   PT Evaluation $PT Eval Moderate Complexity: 1 Mod PT Treatments $Therapeutic Activity: 8-22 mins PT General Charges $$ ACUTE PT VISIT: 1 Visit         Lacinda Fass, PT, DPT  04/20/2024, 1:09 PM

## 2024-04-20 NOTE — Plan of Care (Signed)
  Problem: Education: Goal: Knowledge of General Education information will improve Description: Including pain rating scale, medication(s)/side effects and non-pharmacologic comfort measures Outcome: Progressing   Problem: Clinical Measurements: Goal: Respiratory complications will improve Outcome: Progressing Goal: Cardiovascular complication will be avoided Outcome: Progressing   Problem: Nutrition: Goal: Adequate nutrition will be maintained Outcome: Progressing   Problem: Coping: Goal: Level of anxiety will decrease Outcome: Progressing   Problem: Pain Managment: Goal: General experience of comfort will improve and/or be controlled Outcome: Progressing

## 2024-04-21 ENCOUNTER — Telehealth: Payer: Self-pay | Admitting: Gastroenterology

## 2024-04-21 DIAGNOSIS — R578 Other shock: Secondary | ICD-10-CM | POA: Diagnosis not present

## 2024-04-21 DIAGNOSIS — K922 Gastrointestinal hemorrhage, unspecified: Secondary | ICD-10-CM | POA: Diagnosis not present

## 2024-04-21 DIAGNOSIS — N179 Acute kidney failure, unspecified: Secondary | ICD-10-CM | POA: Diagnosis not present

## 2024-04-21 DIAGNOSIS — D62 Acute posthemorrhagic anemia: Secondary | ICD-10-CM | POA: Diagnosis not present

## 2024-04-21 LAB — BASIC METABOLIC PANEL WITH GFR
Anion gap: 8 (ref 5–15)
BUN: 27 mg/dL — ABNORMAL HIGH (ref 8–23)
CO2: 19 mmol/L — ABNORMAL LOW (ref 22–32)
Calcium: 8 mg/dL — ABNORMAL LOW (ref 8.9–10.3)
Chloride: 111 mmol/L (ref 98–111)
Creatinine, Ser: 0.89 mg/dL (ref 0.61–1.24)
GFR, Estimated: >60 mL/min (ref >60)
Glucose, Bld: 114 mg/dL — ABNORMAL HIGH (ref 70–99)
Potassium: 3.8 mmol/L (ref 3.5–5.1)
Sodium: 139 mmol/L (ref 135–145)

## 2024-04-21 LAB — CBC
HCT: 25.1 % — ABNORMAL LOW (ref 39.0–52.0)
Hemoglobin: 8 g/dL — ABNORMAL LOW (ref 13.0–17.0)
MCH: 32.5 pg (ref 26.0–34.0)
MCHC: 31.9 g/dL (ref 30.0–36.0)
MCV: 102 fL — ABNORMAL HIGH (ref 80.0–100.0)
Platelets: 162 K/uL (ref 150–400)
RBC: 2.46 MIL/uL — ABNORMAL LOW (ref 4.22–5.81)
RDW: 20.7 % — ABNORMAL HIGH (ref 11.5–15.5)
WBC: 13.6 K/uL — ABNORMAL HIGH (ref 4.0–10.5)
nRBC: 0.4 % — ABNORMAL HIGH (ref 0.0–0.2)

## 2024-04-21 LAB — MAGNESIUM: Magnesium: 2.2 mg/dL (ref 1.7–2.4)

## 2024-04-21 MED ORDER — AMOXICILLIN-POT CLAVULANATE 875-125 MG PO TABS: 1.0000 | ORAL_TABLET | Freq: Two times a day (BID) | ORAL | 0 refills | Status: DC

## 2024-04-21 MED ORDER — DOXYCYCLINE HYCLATE 100 MG PO TABS
100.0000 mg | ORAL_TABLET | Freq: Two times a day (BID) | ORAL | Status: DC
  Administered 2024-04-21: 100 mg via ORAL
  Filled 2024-04-21: qty 1

## 2024-04-21 MED ORDER — PREDNISONE 20 MG PO TABS
50.0000 mg | ORAL_TABLET | Freq: Every day | ORAL | Status: DC
  Administered 2024-04-21: 50 mg via ORAL
  Filled 2024-04-21: qty 1

## 2024-04-21 MED ORDER — PREDNISONE 1 MG PO TABS: 1.0000 mg | ORAL_TABLET | Freq: Every evening | ORAL | Status: AC

## 2024-04-21 MED ORDER — PREDNISONE 10 MG PO TABS: 50.0000 mg | ORAL_TABLET | Freq: Every day | ORAL | 0 refills | Status: DC

## 2024-04-21 MED ORDER — PREDNISONE 5 MG PO TABS: 5.0000 mg | ORAL_TABLET | Freq: Every day | ORAL | Status: AC

## 2024-04-21 MED ORDER — PANTOPRAZOLE SODIUM 40 MG PO TBEC
40.0000 mg | DELAYED_RELEASE_TABLET | Freq: Two times a day (BID) | ORAL | Status: DC
  Administered 2024-04-21: 40 mg via ORAL
  Filled 2024-04-21: qty 1

## 2024-04-21 MED ORDER — DOXYCYCLINE HYCLATE 100 MG PO TABS: 100.0000 mg | ORAL_TABLET | Freq: Two times a day (BID) | ORAL | 0 refills | Status: DC

## 2024-04-21 MED ORDER — PANTOPRAZOLE SODIUM 40 MG PO TBEC: 40.0000 mg | DELAYED_RELEASE_TABLET | Freq: Two times a day (BID) | ORAL | 1 refills | Status: AC

## 2024-04-21 MED ORDER — AMOXICILLIN-POT CLAVULANATE 875-125 MG PO TABS
1.0000 | ORAL_TABLET | Freq: Two times a day (BID) | ORAL | Status: DC
  Administered 2024-04-21: 1 via ORAL
  Filled 2024-04-21: qty 1

## 2024-04-21 MED ORDER — SUCRALFATE 1 GM/10ML PO SUSP: 1.0000 g | Freq: Three times a day (TID) | ORAL | 2 refills | Status: DC

## 2024-04-21 NOTE — TOC Transition Note (Signed)
 Transition of Care [facility]) - Discharge Note   Patient Details  Name: Jeffrey Stewart MRN: [id number] Date of Birth: Sep 22, 1934  [facility] CM/SW Contact:  Lucie Lunger, LCSWA Phone Number: 04/21/2024, 11:06 AM   Clinical Narrative:    CSW updated that pt is medically stable for D/C home today. CSW updated Delon with [facility] of plan for D/C home. HH orders placed by MD. OTC signing off.   Final next level of care: Home w Home Health Services Barriers to Discharge: Barriers Resolved   Patient Goals and CMS Choice Patient states their goals for this hospitalization and ongoing recovery are:: return home CMS Medicare.gov Compare Post Acute Care list provided to:: Patient Choice offered to / list presented to : Patient, Spouse, Adult Children      Discharge Placement                       Discharge Plan and Services Additional resources added to the After Visit Summary for   In-house Referral: Clinical Social Work Discharge Planning Services: CM Consult Post Acute Care Choice: Home Health                    HH Arranged: PT, OT [facility] Agency: [facility] Date [facility] Agency Contacted: 04/21/24   Representative spoke with at [facility] Agency: Delon  Social Drivers of Health (SDOH) Interventions SDOH Screenings   Food Insecurity: No Food Insecurity (04/15/2024)  Housing: Low Risk  (04/15/2024)  Transportation Needs: No Transportation Needs (04/15/2024)  Utilities: Not At Risk (04/15/2024)  Social Connections: Moderately Integrated (04/15/2024)  Tobacco Use: Low Risk  (04/14/2024)     Readmission Risk Interventions    04/21/2024   11:03 AM 04/15/2024   11:41 AM 09/10/2023   12:52 PM  Readmission Risk Prevention Plan  Transportation Screening Complete Complete Complete  PCP or Specialist Appt within 5-7 Days   Complete  Home Care Screening   Complete  Medication Review (RN CM)   Complete  HRI or Home Care Consult Complete Complete   Social Work  Consult for Recovery Care Planning/Counseling Complete Complete   Palliative Care Screening Not Applicable Not Applicable   Medication Review Oceanographer) Complete Complete

## 2024-04-21 NOTE — Plan of Care (Signed)

## 2024-04-21 NOTE — Discharge Summary (Signed)
 Physician Discharge Summary   Patient: Jeffrey Stewart MRN: [id number] DOB: Dec 14, 1934  Admit date:     04/14/2024  Discharge date: 04/21/24  Discharge Physician: Alm Eastin Swing   PCP: Anita Bernardino [name], FNP   Recommendations at discharge:   Please follow up with primary care provider within 1-2 weeks  Please repeat BMP and CBC in one week     Hospital Course: 89 y.o. male with medical history significant for dCHF, interstitial lung disease, RBBB, rheumatoid arthritis, CAD. Patient was brought to the ED via EMS with reports of vomiting of blood, and blood in his stools that started this morning.  Spouse is at bedside and confirms that patients vomitus contained golf sized blood clots.  He is not aware of having any black stools.  Reports some lower abdominal pain earlier today that has since resolved.  He has history of hemorrhoids which occasionally bleed, but not to this extent, he has never vomited blood.  He denies NSAID use.  EMS reports blood pressure transiently down to 80s and route.   Recently hospitalized 9/29 to 9/27 for hip fracture-underwent ORIF 03/2021.  Also with acute blood loss anemia postprocedure, requiring 1 unit PRBC.  Hemoglobin on discharge was 7.1.  Refused SNF and was discharged to home.  He is not on full dose anticoagulation.   ED Course: Temperature 98.2.  Tachycardic heart rate 94-116.  Respiratory rate 18- 35.  Blood pressure systolic 94-115.  O2 sat greater than 92% on room air. Hemoglobin 6.3.  WBC 28.2.  Lactic acid 3.9.  CTAP W OC-diarrheal state, correlation with clinical exam and stool cultures recommended.  EDP talked to Dr. Cindie, n.p.o., IV PPI twice daily, plan for EGD 9/30.  His hospitalization was prolonged secondary to hemorrhagic shock.  During the hospitalization, the patient's hematemesis resolved.  He underwent EGD which showed 3 nonbleeding cratered gastric ulcers.  These were injected with epinephrine  and treated with bipolar cautery.  He was started on  Protonix IV twice daily and Carafate.  He required transfusion with 5 units PRBC total.  He had prolonged hypotension and required Levophed drip. The patient did have a bloody bowel movement after the EGD.  CTA bleeding scan of the abdomen was negative for active bleed.  He was monitored closely.  His hemoglobin stabilized.  He did not have any further bloody bowel movements.  He was started on low-dose midodrine.  Ultimately, his blood pressure stabilized off Levophed and his hemoglobin remained stable.  CT chest did reveal a new irregular nodular density in the right upper lobe which needs outpatient follow-up and surveillance.  This was treated empirically as aspiration pneumonitis.  Assessment and Plan: Hemorrhagic Shock Acute blood loss anemia Multiple gastric ulcers  -- pt had blood BMs/hematemesis, transferred to ICU started on pressor support for hypotension -- pt had another blood BM 04/17/24 AM>>no more since then -- 5 units PRBC transfused this admisson -- he remains on levophed 1-2 mcg/kg/min -- 9/30 EGD--3x nonbleeding cratered gastric ulcers injected with epi and bipolar cautery -- continue pantoprazole 40 mg IV BID -- sucralfate suspension QID for 8 weeks -- avoid all NSAIDS--pt was on ASA 81 mg bid PTA -- 10/2 CT GI bleed--Hyperdense enteric material or contrast within the cecum and ascending colon, Otherwise, no extravasation of contrast in the remainder of the GI tract to suggest active GI bleeding. -- weaned IV norepinephrine 10/4 - added midodrine, already on stress steroids -- restarted clears 10/3>>full liquids on 10/4>>soft diet on 10/5 -- given  albumin  x 1 10/4 --Patient remained hemodynamically stable off norepinephrine.  He remained hemodynamically stable without any further bloody bowel movements. --He was discharged home with steroid taper. -Hemoglobin 8.0 on day of discharge   Acute on CKD stage 3a Lactic acidosis Hyperkalemia -- treated with IV dextrose  and  insulin initially -- given lokelma 10 g 2 doses -- baseline creatinine 0.9-1.2 -- initially fluid resuscitated -- renal US --negative hydronephrosis -- lactic acidosis trending down with IV fluid and improving low blood pressure -- overall improved - Serum creatinine 0.89 at the discharge   Leukemoid reaction -- multifactorial including steroids, stress demargination, transfusions --9/29 and 10/2 blood cultures neg -- 9/29 UA--no pyuria --overall improved --pt is on steroids; remains afebrile and hemodynamically stable now   New irregular nodular density seen in right upper lobe  -- 10/1 CT chest--irregular 1.0x 2.2 cm RUL opacity, 5x55mm LLL nodule -- PCT 0.79 -- treat empirically with abx as aspiration/PNA -- outpt follow up with pulmonary and surveillance CT -- pt and family informed of plan  -Discharge home with 1 more day of antibiotics to complete 5-day course.   Glaucoma -- resumed home eye drops   Chronic HFpEF -2/923 Echo EF 50%, G1DD, mod decrease RVF -holding torsemide  temporarily   Right hip fracture- 9/29 to 9/27 for hip fracture-underwent ORIF 03/2021. Right thigh has extensive ecchymosis, likely contributed to the anemia. - Hold aspirin  and all anticoagulation    Stage I decubitus ulcer -- skin care protocol   Rheumatoid arthritis/interstitial lung disease- On chronic prednisone  5 mg a.m. and 1 mg p.m. and mycophenolate . - Resume mycophenolate  - remains on stress dose steroids       Consultants: GI Procedures performed: EGD  Disposition: Home Diet recommendation:  Soft diet DISCHARGE MEDICATION: Allergies as of 04/21/2024       Reactions   Niaspan  [niacin ] Other (See Comments)   Intolerance flush feeling when on large dose - is able to take smaller dosage without problem    Meloxicam Other (See Comments)   Disorientation         Medication List     STOP taking these medications    aspirin  EC 81 MG tablet   carvedilol  6.25 MG  tablet Commonly known as: COREG    oxyCODONE  5 MG immediate release tablet Commonly known as: Oxy IR/ROXICODONE    polyethylene glycol powder 17 GM/SCOOP powder Commonly known as: GLYCOLAX /MIRALAX    senna 8.6 MG Tabs tablet Commonly known as: SENOKOT       TAKE these medications    Acetaminophen  Extra Strength 500 MG Tabs Take 2 tablets (1,000 mg total) by mouth every 8 (eight) hours for 14 days.   albuterol  108 (90 Base) MCG/ACT inhaler Commonly known as: VENTOLIN  HFA Inhale 2 puffs into the lungs every 4 (four) hours as needed for wheezing or shortness of breath.   amoxicillin -clavulanate 875-125 MG tablet Commonly known as: AUGMENTIN  Take 1 tablet by mouth every 12 (twelve) hours.   atorvastatin  40 MG tablet Commonly known as: LIPITOR TAKE 1 TABLET AT BEDTIME   brimonidine  0.2 % ophthalmic solution Commonly known as: ALPHAGAN  Place 1 drop into both eyes 2 (two) times daily.   cetirizine 10 MG tablet Commonly known as: ZYRTEC Take 10 mg by mouth at bedtime.   doxycycline 100 MG tablet Commonly known as: VIBRA-TABS Take 1 tablet (100 mg total) by mouth every 12 (twelve) hours.   FeroSul 325 (65 Fe) MG tablet Generic drug: ferrous sulfate Take 1 tablet (325 mg total) by mouth daily  with breakfast.   fish oil-omega-3 fatty acids  1000 MG capsule Take 1 g by mouth 2 (two) times daily.   fluticasone  50 MCG/ACT nasal spray Commonly known as: FLONASE  Place 1-2 sprays into both nostrils daily as needed for allergies or rhinitis.   fluticasone -salmeterol 100-50 MCG/ACT Aepb Commonly known as: ADVAIR Inhale 1 puff into the lungs 2 (two) times daily.   glucosamine-chondroitin 500-400 MG tablet Take 1 tablet by mouth at bedtime.   ipratropium-albuterol  0.5-2.5 (3) MG/3ML Soln Commonly known as: DUONEB Take 3 mLs by nebulization every 6 (six) hours as needed (wheezing, shortness of breath).   latanoprost  0.005 % ophthalmic solution Commonly known as:  XALATAN  Place 1 drop into both eyes at bedtime.   loperamide 2 MG capsule Commonly known as: IMODIUM Take 2 mg by mouth 2 (two) times daily as needed for diarrhea or loose stools.   metroNIDAZOLE  0.75 % cream Commonly known as: METROCREAM  Apply 1 application  topically 2 (two) times daily as needed (for rosacea).   mycophenolate  500 MG tablet Commonly known as: CELLCEPT  TAKE 1 TABLET BY MOUTH TWICE A DAY What changed: how much to take   NEOMYCIN -POLYMYXIN-HYDROCORTISONE  1 % Soln OTIC solution Commonly known as: CORTISPORIN Place 1 drop into both ears as needed (when ears are stopped up).   pantoprazole 40 MG tablet Commonly known as: PROTONIX Take 1 tablet (40 mg total) by mouth 2 (two) times daily.   Polyethyl Glycol-Propyl Glycol 0.4-0.3 % Soln Place 1 drop into both eyes as needed.   potassium chloride  SA 20 MEQ tablet Commonly known as: KLOR-CON  M Take 1 tablet (20 mEq total) by mouth daily.   predniSONE  10 MG tablet Commonly known as: DELTASONE  Take 5 tablets (50 mg total) by mouth daily with breakfast. And decrease by one tablet daily What changed: You were already taking a medication with the same name, and this prescription was added. Make sure you understand how and when to take each.   predniSONE  1 MG tablet Commonly known as: DELTASONE  Take 1 tablet (1 mg total) by mouth at bedtime. Restart after finished with prednisone  taper What changed: additional instructions   predniSONE  5 MG tablet Commonly known as: DELTASONE  Take 1 tablet (5 mg total) by mouth daily with breakfast. Restart after finished with prednisone  taper What changed: additional instructions   sodium chloride  0.65 % nasal spray Commonly known as: OCEAN Place 1 spray into the nose as needed for congestion.   sucralfate 1 GM/10ML suspension Commonly known as: CARAFATE Take 10 mLs (1 g total) by mouth 4 (four) times daily -  with meals and at bedtime.   torsemide  20 MG tablet Commonly  known as: DEMADEX  Take 1 tablet (20 mg total) by mouth daily.   Vitamin D3 1000 units Caps Take 1,000 Units by mouth daily.        Discharge Exam: Filed Weights   04/14/24 1359  Weight: 88 kg   HEENT:  [place]/AT, No thrush, no icterus CV:  RRR, no rub, no S3, no S4 Lung:  CTA, no wheeze, no rhonchi Abd:  soft/+BS, NT Ext:  1 + LE edema, no lymphangitis, no synovitis, no rash   Condition at discharge: stable  The results of significant diagnostics from this hospitalization (including imaging, microbiology, ancillary and laboratory) are listed below for reference.   Imaging Studies: CT ANGIO GI BLEED Result Date: 04/17/2024 CLINICAL DATA:  persistent hematochezia, blood loss anemia EXAM: CTA ABDOMEN AND PELVIS WITHOUT AND WITH CONTRAST TECHNIQUE: Initially, noncontrast CT of the  abdomen and pelvis were performed. Subsequently, Multidetector CT imaging of the abdomen and pelvis was performed using the standard protocol during bolus administration of intravenous contrast. Multiplanar reconstructed images and MIPs were obtained and reviewed to evaluate the vascular anatomy. RADIATION DOSE REDUCTION: This exam was performed according to the departmental dose-optimization program which includes automated exposure control, adjustment of the mA and/or kV according to patient size and/or use of iterative reconstruction technique. CONTRAST:  80mL OMNIPAQUE  IOHEXOL  350 MG/ML SOLN COMPARISON:  04/14/2024 FINDINGS: VASCULAR Aorta: No aortic aneurysm or dissection. Diffuse aortic atherosclerosis. No hemodynamically significant stenosis. Celiac: Patent without acute thrombus, aneurysm, or dissection.No hemodynamically significant stenosis. SMA: Patent without acute thrombus, aneurysm, or dissection.No hemodynamically significant stenosis. Renals: Patent without acute thrombus, aneurysm, or dissection.Mild stenosis of both renal artery ostia from calcified plaque. IMA: Patent without acute thrombus, aneurysm,  or dissection.Moderate narrowing of the IMA ostium from calcified plaque. Inflow: Patent without acute thrombus, aneurysm, or dissection.Diffuse calcified atherosclerosis throughout the inflow vessels without hemodynamically significant stenosis. Proximal Outflow: The bilateral common femoral and visualized portions of the superficial and profunda femoral arteries are patent without acute thrombus, aneurysm, or dissection.No hemodynamically significant stenosis. Veins: No obvious venous abnormality within the limitations of this arterial phase study. Review of the MIP images confirms the above findings. NON-VASCULAR Lower chest: No focal airspace consolidation or pleural effusion.Posterior bibasilar dependent atelectasis. Multifocal scarring also present in the lung bases. Hepatobiliary: No mass.No radiopaque stones or wall thickening of the gallbladder.No intrahepatic or extrahepatic biliary ductal dilation. Pancreas: No mass or main ductal dilation.No peripancreatic inflammation or fluid collection. Spleen: Normal size. No mass. Adrenals/Urinary Tract: No adrenal masses. No renal mass. No hydronephrosis or nephrolithiasis. The urinary bladder is distended without focal abnormality. Stomach/Bowel: The stomach is decompressed without focal abnormality. No small bowel wall thickening or inflammation. No small bowel obstruction.The appendix was not visualized. No right lower quadrant or pericecal inflammatory changes to suggest acute appendicitis. Hyperdense enteric material or contrast within the cecum and ascending colon. Scattered colonic diverticulosis. GI Bleed: No extravasation of contrast to suggest active GI bleeding. Lymphatic: No intraabdominal or pelvic lymphadenopathy. Reproductive: No prostatomegaly.No free pelvic fluid. Other: No pneumoperitoneum, ascites, or mesenteric inflammation. Bilateral inguinal hernia repairs. Musculoskeletal: No acute fracture or destructive lesion.Partially visualized right  femoral intramedullary nail with persistent fracture of the right intertrochanteric femoral neck. Likely postsurgical edema along the right hip. Multilevel degenerative disc disease of the spine. IMPRESSION: VASCULAR 1. No aortic aneurysm, intramural hematoma, or aortic dissection. 2. Hyperdense enteric material or contrast within the cecum and ascending colon, which limits evaluation for GI bleed in this region. Otherwise, no extravasation of contrast in the remainder of the GI tract to suggest active GI bleeding. NON-VASCULAR No additional, acute findings within the abdomen or pelvis. Aortic Atherosclerosis (ICD10-I70.0). Electronically Signed   By: Rogelia Myers M.D.   On: 04/17/2024 09:58   CT CHEST WO CONTRAST Result Date: 04/16/2024 CLINICAL DATA:  Respiratory illness, nondiagnostic xray RUL opacity on CXR. EXAM: CT CHEST WITHOUT CONTRAST TECHNIQUE: Multidetector CT imaging of the chest was performed following the standard protocol without IV contrast. RADIATION DOSE REDUCTION: This exam was performed according to the departmental dose-optimization program which includes automated exposure control, adjustment of the mA and/or kV according to patient size and/or use of iterative reconstruction technique. COMPARISON:  Chest radiograph from 04/15/2024. FINDINGS: Cardiovascular: Normal cardiac size. No pericardial effusion. No aortic aneurysm. There are coronary artery calcifications, in keeping with coronary artery disease. There are also moderate  peripheral atherosclerotic vascular calcifications of thoracic aorta and its major branches. Aberrant origin of right subclavian artery noted, which arises distal to the left subclavian artery and courses towards the right side, posterior to the esophagus. Mediastinum/Nodes: Visualized thyroid gland appears grossly unremarkable. No solid / cystic mediastinal masses. The esophagus is nondistended precluding optimal assessment. There are few mildly prominent  mediastinal lymph nodes, which do not meet the size criteria for lymphadenopathy and appear grossly similar to the prior study, favoring benign etiology. No axillary lymphadenopathy by size criteria. Evaluation of bilateral hila is limited due to lack on intravenous contrast: however, no large hilar lymphadenopathy identified. Lungs/Pleura: The central tracheo-bronchial tree is patent. There is a new irregular solid noncalcified 1.0 x 2.2 cm opacity in the subpleural right upper lobe. There are several additional linear areas of scarring/atelectasis throughout bilateral lungs, which are grossly similar to the prior study. No lung collapse, pleural effusion or pneumothorax. No pulmonary edema. There is a 5 x 5 mm ground-glass nodule in the left lung lower lobe, which is also new since the prior study. Upper Abdomen: There is small sliding hiatal hernia. Visualized upper abdominal viscera within normal limits. Musculoskeletal: Right-sided PICC line noted with its tip in the cavo-atrial junction region. Visualized soft tissues of the chest wall are otherwise grossly unremarkable. No suspicious osseous lesions. There are mild multilevel degenerative changes in the visualized spine. IMPRESSION: 1. There is a new irregular solid noncalcified 1.0 x 2.2 cm opacity in the subpleural right upper lobe. This opacity corresponds to the density described on the recent chest radiograph. This is suspicious. Pulmonary consultation is recommended. 2. There is also a new 5 x 5 mm ground-glass nodule in the left lung lower lobe. This is also indeterminate and differential diagnosis includes infectious/inflammatory etiology versus neoplastic process. 3. Multiple other nonacute observations, as described above. Aortic Atherosclerosis (ICD10-I70.0). Electronically Signed   By: Ree Molt M.D.   On: 04/16/2024 16:44   US  RENAL Result Date: 04/16/2024 CLINICAL DATA:  Acute kidney injury. EXAM: RENAL / URINARY TRACT ULTRASOUND  COMPLETE COMPARISON:  None Available. FINDINGS: Right Kidney: Renal measurements: 10.9 x 5.2 x 5.3 cm = volume: 156 mL. Increased cortical echogenicity. Mild cortical atrophy. No hydronephrosis. Left Kidney: Renal measurements: 13.3 x 5.9 x 5.5 cm = volume: 225 mL. Mildly increased cortical echogenicity. No hydronephrosis. Bladder: Appears normal for degree of bladder distention. Other: None. IMPRESSION: Increased cortical echogenicity of both kidneys consistent with chronic kidney disease. No evidence of hydronephrosis. Electronically Signed   By: Marcey Moan M.D.   On: 04/16/2024 14:09   DG Chest Portable 1 View Result Date: 04/15/2024 CLINICAL DATA:  PICC placement. EXAM: PORTABLE CHEST 1 VIEW COMPARISON:  April 14, 2024. FINDINGS: Stable cardiomediastinal silhouette. Right-sided PICC line is noted with distal tip in expected position of the SVC. Minimal bibasilar subsegmental atelectasis or scarring is noted with probable left basilar pleural thickening. New irregular nodular density seen in right upper lobe; CT scan of the chest is recommended for further evaluation. IMPRESSION: 1. Right-sided PICC line is noted with distal tip in expected position of the SVC. 2. New irregular nodular density seen in right upper lobe; CT scan of the chest is recommended for further evaluation. Electronically Signed   By: Lynwood Landy Raddle M.D.   On: 04/15/2024 17:10   US  EKG SITE RITE Result Date: 04/15/2024 If Site Rite image not attached, placement could not be confirmed due to current cardiac rhythm.  DG Chest [facility] 1 [address]  Result Date: 04/14/2024 CLINICAL DATA:  Questionable sepsis. EXAM: PORTABLE CHEST 1 VIEW COMPARISON:  Chest radiograph dated 04/06/2024 FINDINGS: Shallow inspiration with bibasilar atelectasis. No consolidative changes. No pleural effusion pneumothorax. Stable cardiac silhouette. No acute osseous pathology. IMPRESSION: Shallow inspiration with bibasilar atelectasis. No focal consolidation.  Electronically Signed   By: Vanetta Chou M.D.   On: 04/14/2024 17:30   CT ABDOMEN PELVIS WO CONTRAST Result Date: 04/14/2024 CLINICAL DATA:  Abdominal pain.  Hematemesis. EXAM: CT ABDOMEN AND PELVIS WITHOUT CONTRAST TECHNIQUE: Multidetector CT imaging of the abdomen and pelvis was performed following the standard protocol without IV contrast. RADIATION DOSE REDUCTION: This exam was performed according to the departmental dose-optimization program which includes automated exposure control, adjustment of the mA and/or kV according to patient size and/or use of iterative reconstruction technique. COMPARISON:  Abdominal ultrasound dated 11/04/2021. FINDINGS: Evaluation of this exam is limited in the absence of intravenous contrast. Lower chest: Bibasilar subpleural atelectasis/scarring. There is coronary vascular calcification. No intra-abdominal free air or free fluid. Hepatobiliary: The liver is unremarkable. No biliary dilatation. The gallbladder is unremarkable. Pancreas: Unremarkable. No pancreatic ductal dilatation or surrounding inflammatory changes. Spleen: Normal in size without focal abnormality. Adrenals/Urinary Tract: The adrenal glands unremarkable. There is no hydronephrosis or nephrolithiasis on either side. Bilateral perinephric stranding, nonspecific. Correlation with urinalysis recommended to exclude UTI. The visualized ureters and urinary bladder appear unremarkable. Stomach/Bowel: There is loose stool throughout the colon suggestive of diarrheal state. Correlation with clinical exam and stool cultures recommended. There is no bowel obstruction or active inflammation. Appendectomy. Vascular/Lymphatic: Moderate aortoiliac atherosclerotic disease. The IVC is unremarkable. No portal venous gas. There is no adenopathy. Reproductive: The prostate and seminal vesicles are grossly unremarkable. Other: Small fat containing bilateral inguinal hernias. Anterior pelvic wall hernia repair mesh.  Musculoskeletal: Osteopenia with degenerative changes of the spine. Status post ORIF of right femoral neck fracture. There is edema in the surrounding soft tissues of the right hip. No fluid collection. IMPRESSION: 1. Diarrheal state. Correlation with clinical exam and stool cultures recommended. No bowel obstruction. 2. No hydronephrosis or nephrolithiasis. 3.  Aortic Atherosclerosis (ICD10-I70.0). Electronically Signed   By: Vanetta Chou M.D.   On: 04/14/2024 17:29   DG HIP UNILAT WITH PELVIS 2-3 VIEWS RIGHT Result Date: 04/07/2024 CLINICAL DATA:  Postop internal fixation EXAM: DG HIP (WITH OR WITHOUT PELVIS) 2-3V RIGHT COMPARISON:  04/06/2024 FINDINGS: Changes of internal fixation across the right femoral intertrochanteric fracture. Anatomic alignment. No hardware complicating feature. IMPRESSION: Internal fixation.  No hardware complicating feature. Electronically Signed   By: Franky Crease M.D.   On: 04/07/2024 23:15   DG HIP UNILAT WITH PELVIS 2-3 VIEWS RIGHT Result Date: 04/07/2024 CLINICAL DATA:  Right hip internal fixation. EXAM: DG HIP (WITH OR WITHOUT PELVIS) 2-3V RIGHT COMPARISON:  04/06/2024 FINDINGS: Multiple intraoperative spot images demonstrate internal fixation across the right femoral intertrochanteric fracture. No hardware complicating feature. IMPRESSION: Internal fixation with anatomic alignment.  No complicating feature. Electronically Signed   By: Franky Crease M.D.   On: 04/07/2024 23:07   DG C-Arm 1-60 Min-No Report Result Date: 04/07/2024 Fluoroscopy was utilized by the requesting physician.  No radiographic interpretation.   DG Chest Port 1 View Result Date: 04/06/2024 CLINICAL DATA:  Pre-surgical. EXAM: RIGHT KNEE - 1-2 VIEW; PORTABLE CHEST - 1 VIEW COMPARISON:  09/08/2023, 04/06/2024. FINDINGS: Right knee: No acute fracture or dislocation is seen. There is a bony exostosis along the lateral aspect of the mid femur mild to moderate tricompartmental degenerative changes  are  noted at the knee. There is a trace joint effusion. Vascular calcifications are present in the soft tissues. Chest: The heart is enlarged and the mediastinal contour is within normal limits for supine positioning. Atherosclerotic calcification of the aorta is noted. Lung volumes are low with atelectasis or infiltrate at the lung bases. There small bilateral pleural effusions. No pneumothorax is seen. No acute osseous abnormality. IMPRESSION: 1. No acute fracture or dislocation at the right knee. 2. Small bilateral pleural effusions with atelectasis or infiltrate at the lung bases. 3. Cardiomegaly. Electronically Signed   By: Leita Birmingham M.D.   On: 04/06/2024 17:29   DG Knee 1-2 Views Right Result Date: 04/06/2024 CLINICAL DATA:  Pre-surgical. EXAM: RIGHT KNEE - 1-2 VIEW; PORTABLE CHEST - 1 VIEW COMPARISON:  09/08/2023, 04/06/2024. FINDINGS: Right knee: No acute fracture or dislocation is seen. There is a bony exostosis along the lateral aspect of the mid femur mild to moderate tricompartmental degenerative changes are noted at the knee. There is a trace joint effusion. Vascular calcifications are present in the soft tissues. Chest: The heart is enlarged and the mediastinal contour is within normal limits for supine positioning. Atherosclerotic calcification of the aorta is noted. Lung volumes are low with atelectasis or infiltrate at the lung bases. There small bilateral pleural effusions. No pneumothorax is seen. No acute osseous abnormality. IMPRESSION: 1. No acute fracture or dislocation at the right knee. 2. Small bilateral pleural effusions with atelectasis or infiltrate at the lung bases. 3. Cardiomegaly. Electronically Signed   By: Leita Birmingham M.D.   On: 04/06/2024 17:29   DG Hip Unilat W or Wo Pelvis 2-3 Views Right Result Date: 04/06/2024 CLINICAL DATA:  Fall with diffuse pain. EXAM: DG HIP (WITH OR WITHOUT PELVIS) 2-3V RIGHT COMPARISON:  None Available. FINDINGS: There is diffusely decreased  mineralization of the bones. There is a nondisplaced intratrochanteric fracture of the proximal right femur. The remaining bony structures are intact and there is no dislocation. Degenerative changes are present at the bilateral hips, sacroiliac joints and lower lumbar spine. Vascular calcifications are seen in the soft tissues. IMPRESSION: Minimally displaced intertrochanteric fracture of the proximal right femur. Electronically Signed   By: Leita Birmingham M.D.   On: 04/06/2024 16:22   CT Head Wo Contrast Result Date: 04/06/2024 CLINICAL DATA:  Fall with head injury. EXAM: CT HEAD WITHOUT CONTRAST CT CERVICAL SPINE WITHOUT CONTRAST TECHNIQUE: Multidetector CT imaging of the head and cervical spine was performed following the standard protocol without intravenous contrast. Multiplanar CT image reconstructions of the cervical spine were also generated. RADIATION DOSE REDUCTION: This exam was performed according to the departmental dose-optimization program which includes automated exposure control, adjustment of the mA and/or kV according to patient size and/or use of iterative reconstruction technique. COMPARISON:  CT cervical spine 07/21/2017. FINDINGS: CT HEAD FINDINGS Brain: Ventricles, cisterns and other CSF spaces are normal. Minimal chronic ischemic microvascular disease. No mass, mass effect, shift of midline structures or acute hemorrhage. No evidence of acute infarction. Vascular: No hyperdense vessel or unexpected calcification. Skull: Normal. Negative for fracture or focal lesion. Sinuses/Orbits: Orbits are normal. Minimal opacification over the right sphenoid sinus compatible mild chronic inflammatory change. Other: None. CT CERVICAL SPINE FINDINGS Alignment: Subtle anterior stairstep subluxations over the mid to lower cervical spine unchanged. No posttraumatic subluxation. Skull base and vertebrae: Vertebral body heights are normal. There is mild to moderate spondylosis throughout the cervical spine  to include uncovertebral joint spurring and facet arthropathy. Mild to moderate  left-sided neural foraminal narrowing at the C2-3 level. Mild bilateral neural foraminal narrowing at the C3-4 level and moderate right-sided neural foraminal narrowing at the C4-5 level. Minimal left-sided neural foraminal narrowing at the C5-6 level. No acute fracture. Prevertebral soft tissues are normal. Soft tissues and spinal canal: No spinal canal abnormality. Prevertebral soft tissues normal. Disc levels: Disc space narrowing at the C5-6 and C6-7 levels. Also disc space narrowing at the C7-T1 level. Upper chest: No acute findings. Other: Oval mass over the posterior aspect of the right parotid gland measuring 1.2 cm in short axis with significant enlargement compared to the prior exam. IMPRESSION: 1. No acute brain injury. 2. Minimal chronic ischemic microvascular disease. 3. No acute cervical spine injury. 4. Mild to moderate spondylosis throughout the cervical spine with disc disease at the C5-6, C6-7 and C7-T1 levels. Multilevel neural foraminal narrowing as described. 5. 1.2 cm oval mass over the posterior aspect of the right parotid gland with significant enlargement compared to the prior exam. This may represent an enlarged lymph node versus a primary parotid neoplasm. Consider MRI with without contrast for further evaluation. Electronically Signed   By: Toribio Agreste M.D.   On: 04/06/2024 16:15   CT Cervical Spine Wo Contrast Result Date: 04/06/2024 CLINICAL DATA:  Fall with head injury. EXAM: CT HEAD WITHOUT CONTRAST CT CERVICAL SPINE WITHOUT CONTRAST TECHNIQUE: Multidetector CT imaging of the head and cervical spine was performed following the standard protocol without intravenous contrast. Multiplanar CT image reconstructions of the cervical spine were also generated. RADIATION DOSE REDUCTION: This exam was performed according to the departmental dose-optimization program which includes automated exposure control,  adjustment of the mA and/or kV according to patient size and/or use of iterative reconstruction technique. COMPARISON:  CT cervical spine 07/21/2017. FINDINGS: CT HEAD FINDINGS Brain: Ventricles, cisterns and other CSF spaces are normal. Minimal chronic ischemic microvascular disease. No mass, mass effect, shift of midline structures or acute hemorrhage. No evidence of acute infarction. Vascular: No hyperdense vessel or unexpected calcification. Skull: Normal. Negative for fracture or focal lesion. Sinuses/Orbits: Orbits are normal. Minimal opacification over the right sphenoid sinus compatible mild chronic inflammatory change. Other: None. CT CERVICAL SPINE FINDINGS Alignment: Subtle anterior stairstep subluxations over the mid to lower cervical spine unchanged. No posttraumatic subluxation. Skull base and vertebrae: Vertebral body heights are normal. There is mild to moderate spondylosis throughout the cervical spine to include uncovertebral joint spurring and facet arthropathy. Mild to moderate left-sided neural foraminal narrowing at the C2-3 level. Mild bilateral neural foraminal narrowing at the C3-4 level and moderate right-sided neural foraminal narrowing at the C4-5 level. Minimal left-sided neural foraminal narrowing at the C5-6 level. No acute fracture. Prevertebral soft tissues are normal. Soft tissues and spinal canal: No spinal canal abnormality. Prevertebral soft tissues normal. Disc levels: Disc space narrowing at the C5-6 and C6-7 levels. Also disc space narrowing at the C7-T1 level. Upper chest: No acute findings. Other: Oval mass over the posterior aspect of the right parotid gland measuring 1.2 cm in short axis with significant enlargement compared to the prior exam. IMPRESSION: 1. No acute brain injury. 2. Minimal chronic ischemic microvascular disease. 3. No acute cervical spine injury. 4. Mild to moderate spondylosis throughout the cervical spine with disc disease at the C5-6, C6-7 and C7-T1  levels. Multilevel neural foraminal narrowing as described. 5. 1.2 cm oval mass over the posterior aspect of the right parotid gland with significant enlargement compared to the prior exam. This may represent an enlarged lymph  node versus a primary parotid neoplasm. Consider MRI with without contrast for further evaluation. Electronically Signed   By: Toribio Agreste M.D.   On: 04/06/2024 16:15    Microbiology: Results for orders placed or performed during the hospital encounter of 04/14/24  Resp panel by RT-PCR (RSV, Flu A&B, Covid) Anterior Nasal Swab     Status: None   Collection Time: 04/14/24  2:03 PM   Specimen: Anterior Nasal Swab  Result Value Ref Range Status   SARS Coronavirus 2 by RT PCR NEGATIVE NEGATIVE Final    Comment: (NOTE) SARS-CoV-2 target nucleic acids are NOT DETECTED.  The SARS-CoV-2 RNA is generally detectable in upper respiratory specimens during the acute phase of infection. The lowest concentration of SARS-CoV-2 viral copies this assay can detect is 138 copies/mL. A negative result does not preclude SARS-Cov-2 infection and should not be used as the sole basis for treatment or other patient management decisions. A negative result may occur with  improper specimen collection/handling, submission of specimen other than nasopharyngeal swab, presence of viral mutation(s) within the areas targeted by this assay, and inadequate number of viral copies(<138 copies/mL). A negative result must be combined with clinical observations, patient history, and epidemiological information. The expected result is Negative.  Fact Sheet for Patients:  BloggerCourse.com  Fact Sheet for Healthcare Providers:  SeriousBroker.it  This test is no t yet approved or cleared by the [place]  FDA and  has been authorized for detection and/or diagnosis of SARS-CoV-2 by FDA under an Emergency Use Authorization (EUA). This EUA will remain   in effect (meaning this test can be used) for the duration of the COVID-19 declaration under Section 564(b)(1) of the Act, 21 U.S.C.section 360bbb-3(b)(1), unless the authorization is terminated  or revoked sooner.       Influenza A by PCR NEGATIVE NEGATIVE Final   Influenza B by PCR NEGATIVE NEGATIVE Final    Comment: (NOTE) The Xpert Xpress SARS-CoV-2/FLU/RSV plus assay is intended as an aid in the diagnosis of influenza from Nasopharyngeal swab specimens and should not be used as a sole basis for treatment. Nasal washings and aspirates are unacceptable for Xpert Xpress SARS-CoV-2/FLU/RSV testing.  Fact Sheet for Patients: BloggerCourse.com  Fact Sheet for Healthcare Providers: SeriousBroker.it  This test is not yet approved or cleared by the [place]  FDA and has been authorized for detection and/or diagnosis of SARS-CoV-2 by FDA under an Emergency Use Authorization (EUA). This EUA will remain in effect (meaning this test can be used) for the duration of the COVID-19 declaration under Section 564(b)(1) of the Act, 21 U.S.C. section 360bbb-3(b)(1), unless the authorization is terminated or revoked.     Resp Syncytial Virus by PCR NEGATIVE NEGATIVE Final    Comment: (NOTE) Fact Sheet for Patients: BloggerCourse.com  Fact Sheet for Healthcare Providers: SeriousBroker.it  This test is not yet approved or cleared by the [place]  FDA and has been authorized for detection and/or diagnosis of SARS-CoV-2 by FDA under an Emergency Use Authorization (EUA). This EUA will remain in effect (meaning this test can be used) for the duration of the COVID-19 declaration under Section 564(b)(1) of the Act, 21 U.S.C. section 360bbb-3(b)(1), unless the authorization is terminated or revoked.  Performed at [facility], [address] [place], [place] [address]   Culture, blood  (Routine X 2) w Reflex to ID Panel     Status: None   Collection Time: 04/14/24  9:26 PM   Specimen: BLOOD  Result Value Ref Range Status  Specimen Description BLOOD BLOOD RIGHT HAND  Final   Special Requests NONE  Final   Culture   Final    NO GROWTH 5 DAYS Performed at [facility], [address], [place], [place] [address]    Report Status 04/19/2024 FINAL  Final  Culture, blood (Routine X 2) w Reflex to ID Panel     Status: None   Collection Time: 04/14/24  9:26 PM   Specimen: BLOOD  Result Value Ref Range Status   Specimen Description BLOOD BLOOD LEFT HAND  Final   Special Requests NONE  Final   Culture   Final    NO GROWTH 5 DAYS Performed at [facility], [address] [place], [place] [address]    Report Status 04/19/2024 FINAL  Final  MRSA Next Gen by PCR, Nasal     Status: Abnormal   Collection Time: 04/14/24  9:39 PM   Specimen: Nasal Mucosa; Nasal Swab  Result Value Ref Range Status   MRSA by PCR Next Gen DETECTED (A) NOT DETECTED Final    Comment: RESULT CALLED TO, READ BACK BY AND VERIFIED WITH: R ESOCE,RN@0014  04/15/24 MK (NOTE) The GeneXpert MRSA Assay (FDA approved for NASAL specimens only), is one component of a comprehensive MRSA colonization surveillance program. It is not intended to diagnose MRSA infection nor to guide or monitor treatment for MRSA infections. Test performance is not FDA approved in patients less than 39 years old. Performed at [facility], [address], [place], [place] [address]   Culture, blood (Routine X 2) w Reflex to ID Panel     Status: None (Preliminary result)   Collection Time: 04/17/24  9:52 AM   Specimen: BLOOD  Result Value Ref Range Status   Specimen Description BLOOD BLOOD LEFT ARM  Final   Special Requests   Final    BOTTLES DRAWN AEROBIC AND ANAEROBIC Blood Culture adequate volume   Culture   Final    NO GROWTH 4 DAYS Performed at [facility], [address] [place], [place] [address]    Report Status PENDING   Incomplete  Culture, blood (Routine X 2) w Reflex to ID Panel     Status: None (Preliminary result)   Collection Time: 04/17/24  9:52 AM   Specimen: BLOOD  Result Value Ref Range Status   Specimen Description BLOOD BLOOD RIGHT HAND AEROBIC BOTTLE ONLY  Final   Special Requests   Final    Blood Culture results may not be optimal due to an inadequate volume of blood received in culture bottles   Culture   Final    NO GROWTH 4 DAYS Performed at [facility], [address] [place], [place] [address]    Report Status PENDING  Incomplete    Labs: CBC: Recent Labs  Lab 04/14/24 1422 04/14/24 2040 04/15/24 1154 04/16/24 0506 04/16/24 1214 04/17/24 0435 04/17/24 1418 04/18/24 0521 04/19/24 0522 04/20/24 0549 04/21/24 0427  WBC 28.2*   < > 25.1* 17.7*  --  13.9*  --  11.9* 18.4* 12.1* 13.6*  NEUTROABS 25.9*  --  19.1* 13.6*  --  10.4*  --  8.4*  --   --   --   HGB 6.3*   < > 9.5* 7.8*   < > 7.5* 8.1* 8.0* 8.4* 7.5* 8.0*  HCT 19.7*   < > 28.5* 24.6*   < > 23.6* 24.9* 24.9* 25.5* 23.5* 25.1*  MCV 113.2*   < > 95.6 98.4  --  99.6  --  100.0 99.6  101.3* 102.0*  PLT 274   < > 239 188  --  192  --  171 190 163 162   < > = values in this interval not displayed.   Basic Metabolic Panel: Recent Labs  Lab 04/15/24 0339 04/15/24 1154 04/17/24 0435 04/18/24 0521 04/19/24 0522 04/20/24 0549 04/21/24 0427  NA 138   < > 140 139 138 139 139  K 5.7*   < > 3.9 3.9 4.0 4.0 3.8  CL 104   < > 108 111 110 112* 111  CO2 20*   < > 23 21* 21* 20* 19*  GLUCOSE 212*   < > 173* 142* 144* 127* 114*  BUN 87*   < > 59* 42* 32* 28* 27*  CREATININE 2.16*   < > 1.29* 1.02 0.98 0.93 0.89  CALCIUM  7.4*   < > 7.7* 7.8* 7.9* 7.9* 8.0*  MG 2.4  --  2.6*  --   --   --  2.2  PHOS 4.7*  --   --   --   --   --   --    < > = values in this interval not displayed.   Liver Function Tests: Recent Labs  Lab 04/14/24 1422 04/19/24 0522 04/20/24 0549  AST 26 19 17   ALT 13 10 9   ALKPHOS 42 55 58  BILITOT  1.7* 1.2 1.1  PROT 4.7* 3.9* 3.8*  ALBUMIN  2.6* 2.7* 2.7*   CBG: Recent Labs  Lab 04/14/24 2210 04/15/24 0541 04/15/24 1342 04/17/24 1614 04/20/24 1604  GLUCAP 175* 221* 154* 145* 166*    Discharge time spent: greater than 30 minutes.  Signed: Alm Schneider, MD [facility] 04/21/2024

## 2024-04-21 NOTE — Care Management Important Message (Signed)
 Important Message  Patient Details  Name: Jeffrey Stewart MRN: [id number] Date of Birth: January 25, 1935   Important Message Given:  Yes - Medicare IM     Shamonique Battiste L Kyri Shader 04/21/2024, 11:04 AM

## 2024-04-21 NOTE — Progress Notes (Signed)
 PICC line removed while patient lying flat and held breath. Pressure held x 5 minutes, bleeding controlled and pressure dressing applied. Pt tolerated well. Nad. Instructions to keep bandage on for 24 hours and do not lift with this arm. Pt and family agreed.

## 2024-04-21 NOTE — Progress Notes (Signed)
 3 mepilex bandages changed to right hip prior to d/c. Pt wanted something for pain prior to leaving. See [facility]

## 2024-04-21 NOTE — Telephone Encounter (Signed)
 Patient needs hospital follow-up in about 6 weeks. Dr Cindie or any APP. We will arrange EGD at that time. We all saw him inpatient.

## 2024-04-22 LAB — CULTURE, BLOOD (ROUTINE X 2)
Culture: NO GROWTH
Culture: NO GROWTH
Special Requests: ADEQUATE

## 2024-04-23 ENCOUNTER — Inpatient Hospital Stay: Admitting: Physician Assistant

## 2024-04-23 ENCOUNTER — Inpatient Hospital Stay

## 2024-04-24 ENCOUNTER — Ambulatory Visit (INDEPENDENT_AMBULATORY_CARE_PROVIDER_SITE_OTHER): Admitting: Orthopedic Surgery

## 2024-04-24 ENCOUNTER — Other Ambulatory Visit (INDEPENDENT_AMBULATORY_CARE_PROVIDER_SITE_OTHER): Payer: Self-pay

## 2024-04-24 DIAGNOSIS — M25551 Pain in right hip: Secondary | ICD-10-CM

## 2024-04-24 NOTE — Progress Notes (Signed)
 Orthopedic Surgery Post-operative Office Visit  Procedure: right intertrochanteric femur fracture s/p CMN Date of Surgery: 04/12/2024 (~2 weeks ago)  Assessment: Patient is a 89 y.o. who is doing as expected after surgery   Plan: -Operative plans complete -Weight bearing as tolerated right lower extremity -Okay to let soap/water run over incision but do not submerge -Pain management: weaning oxycodone  -Return to office in 4 weeks, x-rays needed at next visit: AP/lateral hip right hip  ___________________________________________________________________________   Subjective: Patient's hip pain has been getting better with time. Has been ambulating short distances with a walker. Initially had some drainage but has not seen any recently. No erythema.   Objective:  General: no acute distress, appropriate affect Neurologic: alert, answering questions appropriately, following commands Respiratory: unlabored breathing on room air Skin: incisions are well approximated with no erythema, induration, active/expressible drainage  MSK (RLE): edema throughout the leg seen (similar to other side), EHL/TA/GSC intact, plantarflexes and dorsiflexes toes, sensation intact light touch in sural/saphenous/deep peroneal/superficial peroneal/tibial nerve distributions, foot warm and well-perfused  Imaging: X-rays of the right hip from 04/24/2024 were independently reviewed and interpreted, showing a short cephalomedullary rod in place.  No lucency around the interlocking or lag screws.  A nondisplaced intertrochanteric femur fracture is seen.  No new fracture seen.  No dislocation seen.   Patient name: Jeffrey Stewart Patient MRN: [id number] Date of visit: 04/24/24

## 2024-04-25 ENCOUNTER — Telehealth: Payer: Self-pay | Admitting: Orthopedic Surgery

## 2024-04-25 NOTE — Telephone Encounter (Signed)
 I called and lmom advising Jeffrey Stewart that he is weightbearing as tolerated on Right lower extremity

## 2024-04-25 NOTE — Telephone Encounter (Signed)
 Lorie from [facility] calling to verify the weight barring for the patient. CB#[phone]

## 2024-04-30 ENCOUNTER — Encounter (INDEPENDENT_AMBULATORY_CARE_PROVIDER_SITE_OTHER): Payer: Self-pay | Admitting: Gastroenterology

## 2024-05-06 ENCOUNTER — Emergency Department (HOSPITAL_BASED_OUTPATIENT_CLINIC_OR_DEPARTMENT_OTHER)

## 2024-05-06 ENCOUNTER — Inpatient Hospital Stay (HOSPITAL_BASED_OUTPATIENT_CLINIC_OR_DEPARTMENT_OTHER)
Admission: EM | Admit: 2024-05-06 | Discharge: 2024-05-11 | DRG: 299 | Disposition: A | Discharge status: Skilled Nursing Facility | Source: Ambulatory Visit | Attending: Internal Medicine | Admitting: Internal Medicine

## 2024-05-06 ENCOUNTER — Other Ambulatory Visit: Payer: Self-pay

## 2024-05-06 ENCOUNTER — Emergency Department (HOSPITAL_BASED_OUTPATIENT_CLINIC_OR_DEPARTMENT_OTHER): Admitting: Radiology

## 2024-05-06 ENCOUNTER — Encounter (HOSPITAL_BASED_OUTPATIENT_CLINIC_OR_DEPARTMENT_OTHER): Payer: Self-pay | Admitting: Emergency Medicine

## 2024-05-06 DIAGNOSIS — J9811 Atelectasis: Secondary | ICD-10-CM | POA: Diagnosis present

## 2024-05-06 DIAGNOSIS — D649 Anemia, unspecified: Secondary | ICD-10-CM | POA: Diagnosis present

## 2024-05-06 DIAGNOSIS — J4489 Other specified chronic obstructive pulmonary disease: Secondary | ICD-10-CM | POA: Diagnosis present

## 2024-05-06 DIAGNOSIS — J8410 Pulmonary fibrosis, unspecified: Secondary | ICD-10-CM | POA: Diagnosis present

## 2024-05-06 DIAGNOSIS — Z66 Do not resuscitate: Secondary | ICD-10-CM | POA: Diagnosis present

## 2024-05-06 DIAGNOSIS — I4510 Unspecified right bundle-branch block: Secondary | ICD-10-CM | POA: Diagnosis present

## 2024-05-06 DIAGNOSIS — L8990 Pressure ulcer of unspecified site, unspecified stage: Secondary | ICD-10-CM | POA: Insufficient documentation

## 2024-05-06 DIAGNOSIS — I77810 Thoracic aortic ectasia: Secondary | ICD-10-CM | POA: Diagnosis present

## 2024-05-06 DIAGNOSIS — I82402 Acute embolism and thrombosis of unspecified deep veins of left lower extremity: Secondary | ICD-10-CM | POA: Diagnosis present

## 2024-05-06 DIAGNOSIS — I351 Nonrheumatic aortic (valve) insufficiency: Secondary | ICD-10-CM | POA: Diagnosis present

## 2024-05-06 DIAGNOSIS — Z79899 Other long term (current) drug therapy: Secondary | ICD-10-CM | POA: Diagnosis not present

## 2024-05-06 DIAGNOSIS — W19XXXD Unspecified fall, subsequent encounter: Secondary | ICD-10-CM | POA: Diagnosis present

## 2024-05-06 DIAGNOSIS — Z886 Allergy status to analgesic agent status: Secondary | ICD-10-CM

## 2024-05-06 DIAGNOSIS — E785 Hyperlipidemia, unspecified: Secondary | ICD-10-CM | POA: Diagnosis present

## 2024-05-06 DIAGNOSIS — Z801 Family history of malignant neoplasm of trachea, bronchus and lung: Secondary | ICD-10-CM

## 2024-05-06 DIAGNOSIS — Z7984 Long term (current) use of oral hypoglycemic drugs: Secondary | ICD-10-CM

## 2024-05-06 DIAGNOSIS — Z8711 Personal history of peptic ulcer disease: Secondary | ICD-10-CM

## 2024-05-06 DIAGNOSIS — I82452 Acute embolism and thrombosis of left peroneal vein: Principal | ICD-10-CM | POA: Diagnosis present

## 2024-05-06 DIAGNOSIS — Z7951 Long term (current) use of inhaled steroids: Secondary | ICD-10-CM

## 2024-05-06 DIAGNOSIS — M0600 Rheumatoid arthritis without rheumatoid factor, unspecified site: Secondary | ICD-10-CM | POA: Diagnosis present

## 2024-05-06 DIAGNOSIS — Z7901 Long term (current) use of anticoagulants: Secondary | ICD-10-CM | POA: Diagnosis not present

## 2024-05-06 DIAGNOSIS — I10 Essential (primary) hypertension: Secondary | ICD-10-CM | POA: Diagnosis not present

## 2024-05-06 DIAGNOSIS — Z8781 Personal history of (healed) traumatic fracture: Secondary | ICD-10-CM | POA: Diagnosis not present

## 2024-05-06 DIAGNOSIS — I110 Hypertensive heart disease with heart failure: Secondary | ICD-10-CM | POA: Diagnosis present

## 2024-05-06 DIAGNOSIS — Z833 Family history of diabetes mellitus: Secondary | ICD-10-CM | POA: Diagnosis not present

## 2024-05-06 DIAGNOSIS — I252 Old myocardial infarction: Secondary | ICD-10-CM | POA: Diagnosis not present

## 2024-05-06 DIAGNOSIS — Z79624 Long term (current) use of inhibitors of nucleotide synthesis: Secondary | ICD-10-CM

## 2024-05-06 DIAGNOSIS — I272 Pulmonary hypertension, unspecified: Secondary | ICD-10-CM | POA: Diagnosis present

## 2024-05-06 DIAGNOSIS — I509 Heart failure, unspecified: Secondary | ICD-10-CM

## 2024-05-06 DIAGNOSIS — Z888 Allergy status to other drugs, medicaments and biological substances status: Secondary | ICD-10-CM

## 2024-05-06 DIAGNOSIS — S72001D Fracture of unspecified part of neck of right femur, subsequent encounter for closed fracture with routine healing: Secondary | ICD-10-CM

## 2024-05-06 DIAGNOSIS — K259 Gastric ulcer, unspecified as acute or chronic, without hemorrhage or perforation: Secondary | ICD-10-CM

## 2024-05-06 DIAGNOSIS — I5033 Acute on chronic diastolic (congestive) heart failure: Secondary | ICD-10-CM | POA: Diagnosis present

## 2024-05-06 DIAGNOSIS — M353 Polymyalgia rheumatica: Secondary | ICD-10-CM | POA: Diagnosis present

## 2024-05-06 DIAGNOSIS — H409 Unspecified glaucoma: Secondary | ICD-10-CM | POA: Diagnosis present

## 2024-05-06 DIAGNOSIS — M069 Rheumatoid arthritis, unspecified: Secondary | ICD-10-CM

## 2024-05-06 DIAGNOSIS — I2510 Atherosclerotic heart disease of native coronary artery without angina pectoris: Secondary | ICD-10-CM | POA: Diagnosis present

## 2024-05-06 LAB — CBC
HCT: 33.1 % — ABNORMAL LOW (ref 39.0–52.0)
Hemoglobin: 10.3 g/dL — ABNORMAL LOW (ref 13.0–17.0)
MCH: 32.9 pg (ref 26.0–34.0)
MCHC: 31.1 g/dL (ref 30.0–36.0)
MCV: 105.8 fL — ABNORMAL HIGH (ref 80.0–100.0)
Platelets: 148 K/uL — ABNORMAL LOW (ref 150–400)
RBC: 3.13 MIL/uL — ABNORMAL LOW (ref 4.22–5.81)
RDW: 22.6 % — ABNORMAL HIGH (ref 11.5–15.5)
WBC: 10.9 K/uL — ABNORMAL HIGH (ref 4.0–10.5)
nRBC: 0.2 % (ref 0.0–0.2)

## 2024-05-06 LAB — BASIC METABOLIC PANEL WITH GFR
Anion gap: 12 (ref 5–15)
BUN: 28 mg/dL — ABNORMAL HIGH (ref 8–23)
CO2: 26 mmol/L (ref 22–32)
Calcium: 9.4 mg/dL (ref 8.9–10.3)
Chloride: 103 mmol/L (ref 98–111)
Creatinine, Ser: 1 mg/dL (ref 0.61–1.24)
GFR, Estimated: >60 mL/min (ref >60)
Glucose, Bld: 125 mg/dL — ABNORMAL HIGH (ref 70–99)
Potassium: 4 mmol/L (ref 3.5–5.1)
Sodium: 141 mmol/L (ref 135–145)

## 2024-05-06 LAB — TROPONIN T, HIGH SENSITIVITY
Troponin T High Sensitivity: 37 ng/L — ABNORMAL HIGH (ref 0–19)
Troponin T High Sensitivity: 34 ng/L — ABNORMAL HIGH (ref 0–19)

## 2024-05-06 LAB — PRO BRAIN NATRIURETIC PEPTIDE: Pro Brain Natriuretic Peptide: 360 pg/mL — ABNORMAL HIGH (ref <300.0)

## 2024-05-06 MED ORDER — LATANOPROST 0.005 % OP SOLN
1.0000 [drp] | Freq: Every day | OPHTHALMIC | Status: DC
  Administered 2024-05-06 – 2024-05-10 (×5): 1 [drp] via OPHTHALMIC
  Filled 2024-05-06: qty 2.5

## 2024-05-06 MED ORDER — HEPARIN (PORCINE) 25000 UT/250ML-% IV SOLN
1400.0000 [IU]/h | INTRAVENOUS | Status: DC
  Administered 2024-05-06 – 2024-05-08 (×3): 1500 [IU]/h via INTRAVENOUS
  Filled 2024-05-06 (×4): qty 250

## 2024-05-06 MED ORDER — PREDNISONE 1 MG PO TABS
1.0000 mg | ORAL_TABLET | Freq: Every day | ORAL | Status: DC
  Administered 2024-05-06 – 2024-05-10 (×5): 1 mg via ORAL
  Filled 2024-05-06 (×5): qty 1

## 2024-05-06 MED ORDER — IPRATROPIUM-ALBUTEROL 0.5-2.5 (3) MG/3ML IN SOLN: 3.0000 mL | Freq: Four times a day (QID) | RESPIRATORY_TRACT | Status: DC | PRN

## 2024-05-06 MED ORDER — ACETAMINOPHEN 650 MG RE SUPP: 650.0000 mg | Freq: Four times a day (QID) | RECTAL | Status: DC | PRN

## 2024-05-06 MED ORDER — VITAMIN D 25 MCG (1000 UNIT) PO TABS
1000.0000 [IU] | ORAL_TABLET | Freq: Every day | ORAL | Status: DC
  Administered 2024-05-07 – 2024-05-11 (×5): 1000 [IU] via ORAL
  Filled 2024-05-06 (×6): qty 1

## 2024-05-06 MED ORDER — ATORVASTATIN CALCIUM 40 MG PO TABS
40.0000 mg | ORAL_TABLET | Freq: Every day | ORAL | Status: DC
  Administered 2024-05-06 – 2024-05-10 (×5): 40 mg via ORAL
  Filled 2024-05-06 (×5): qty 1

## 2024-05-06 MED ORDER — FLUTICASONE FUROATE-VILANTEROL 100-25 MCG/ACT IN AEPB
1.0000 | INHALATION_SPRAY | Freq: Every day | RESPIRATORY_TRACT | Status: DC
  Administered 2024-05-07 – 2024-05-11 (×5): 1 via RESPIRATORY_TRACT
  Filled 2024-05-06 (×2): qty 28

## 2024-05-06 MED ORDER — ACETAMINOPHEN 325 MG PO TABS
650.0000 mg | ORAL_TABLET | Freq: Four times a day (QID) | ORAL | Status: DC | PRN
  Administered 2024-05-07 – 2024-05-11 (×8): 650 mg via ORAL
  Filled 2024-05-06 (×8): qty 2

## 2024-05-06 MED ORDER — LORATADINE 10 MG PO TABS
10.0000 mg | ORAL_TABLET | Freq: Every day | ORAL | Status: DC
  Administered 2024-05-07 – 2024-05-11 (×5): 10 mg via ORAL
  Filled 2024-05-06 (×5): qty 1

## 2024-05-06 MED ORDER — BRIMONIDINE TARTRATE 0.2 % OP SOLN
1.0000 [drp] | Freq: Two times a day (BID) | OPHTHALMIC | Status: DC
  Administered 2024-05-07 – 2024-05-11 (×8): 1 [drp] via OPHTHALMIC
  Filled 2024-05-06 (×2): qty 5

## 2024-05-06 MED ORDER — OMEGA-3-ACID ETHYL ESTERS 1 G PO CAPS
1.0000 g | ORAL_CAPSULE | Freq: Two times a day (BID) | ORAL | Status: DC
  Administered 2024-05-06 – 2024-05-11 (×10): 1 g via ORAL
  Filled 2024-05-06 (×10): qty 1

## 2024-05-06 MED ORDER — BISACODYL 5 MG PO TBEC: 5.0000 mg | DELAYED_RELEASE_TABLET | Freq: Every day | ORAL | Status: DC | PRN

## 2024-05-06 MED ORDER — PREDNISONE 5 MG PO TABS
5.0000 mg | ORAL_TABLET | Freq: Every day | ORAL | Status: DC
  Administered 2024-05-07 – 2024-05-11 (×5): 5 mg via ORAL
  Filled 2024-05-06 (×5): qty 1

## 2024-05-06 MED ORDER — MYCOPHENOLATE MOFETIL 250 MG PO CAPS
1000.0000 mg | ORAL_CAPSULE | Freq: Two times a day (BID) | ORAL | Status: DC
  Administered 2024-05-06 – 2024-05-11 (×10): 1000 mg via ORAL
  Filled 2024-05-06 (×10): qty 4

## 2024-05-06 MED ORDER — OMEGA-3 FATTY ACIDS 1000 MG PO CAPS: 1.0000 g | ORAL_CAPSULE | Freq: Two times a day (BID) | ORAL | Status: DC

## 2024-05-06 MED ORDER — ACETAMINOPHEN 325 MG PO TABS
650.0000 mg | ORAL_TABLET | Freq: Once | ORAL | Status: AC
  Administered 2024-05-06: 650 mg via ORAL
  Filled 2024-05-06: qty 2

## 2024-05-06 MED ORDER — ONDANSETRON HCL 4 MG/2ML IJ SOLN: 4.0000 mg | Freq: Four times a day (QID) | INTRAMUSCULAR | Status: DC | PRN

## 2024-05-06 MED ORDER — POTASSIUM CHLORIDE CRYS ER 20 MEQ PO TBCR
20.0000 meq | EXTENDED_RELEASE_TABLET | Freq: Every day | ORAL | Status: DC
  Administered 2024-05-07 – 2024-05-08 (×2): 20 meq via ORAL
  Filled 2024-05-06 (×2): qty 1

## 2024-05-06 MED ORDER — FERROUS SULFATE 325 (65 FE) MG PO TABS
325.0000 mg | ORAL_TABLET | Freq: Every day | ORAL | Status: DC
  Administered 2024-05-07 – 2024-05-11 (×5): 325 mg via ORAL
  Filled 2024-05-06 (×5): qty 1

## 2024-05-06 MED ORDER — PANTOPRAZOLE SODIUM 40 MG PO TBEC
40.0000 mg | DELAYED_RELEASE_TABLET | Freq: Two times a day (BID) | ORAL | Status: DC
  Administered 2024-05-06 – 2024-05-11 (×10): 40 mg via ORAL
  Filled 2024-05-06 (×10): qty 1

## 2024-05-06 MED ORDER — FUROSEMIDE 10 MG/ML IJ SOLN
40.0000 mg | Freq: Once | INTRAMUSCULAR | Status: AC
  Administered 2024-05-06: 40 mg via INTRAVENOUS
  Filled 2024-05-06: qty 4

## 2024-05-06 MED ORDER — HEPARIN BOLUS VIA INFUSION
4000.0000 [IU] | Freq: Once | INTRAVENOUS | Status: AC
  Administered 2024-05-06: 4000 [IU] via INTRAVENOUS

## 2024-05-06 MED ORDER — ONDANSETRON HCL 4 MG PO TABS: 4.0000 mg | ORAL_TABLET | Freq: Four times a day (QID) | ORAL | Status: DC | PRN

## 2024-05-06 MED ORDER — SENNOSIDES-DOCUSATE SODIUM 8.6-50 MG PO TABS: 1.0000 | ORAL_TABLET | Freq: Every evening | ORAL | Status: DC | PRN

## 2024-05-06 MED ORDER — SUCRALFATE 1 GM/10ML PO SUSP
1.0000 g | Freq: Three times a day (TID) | ORAL | Status: DC
  Administered 2024-05-07: 1 g via ORAL
  Filled 2024-05-06 (×2): qty 10

## 2024-05-06 NOTE — Progress Notes (Signed)
 89 y.o. male with a past medical history significant for CHF, interstitial lung disease, RA, and CAD, right hip fracture s/p ORIF 03/2024, recent admission for hemorrhagic shock ,GI bleed presented to [place] ED due to bilateral lower extremity edema.   He got IV lasix  in ED, vitals stable. Found to have acute left leg DVT, started on heparin drip. He will need IV lasix  therapy, heparin drip with close monitoring of Hb given recent GI bleed, multiple gastric ulcers. Consider IVC filter. Admit to Medical tele bed.

## 2024-05-06 NOTE — H&P (Signed)
 History and Physical  Jeffrey Stewart FMW:[id number] DOB: 20-Jan-1935 DOA: 05/06/2024  PCP: Anita Bernardino [name], FNP   Chief Complaint: Leg swelling and redness  HPI: Jeffrey Stewart is a 89 y.o. male with medical history significant for chronic diastolic HF, MI/CAD, interstitial lung disease, rheumatoid arthritis, RBBB, right hip fracture s/p ORIF 03/2024, anemia, allergies, arthritis, HTN, HLD and a recent hospitalization for GI bleed and hemorrhagic shock who presented to [facility] for evaluation of bilateral lower extremity swelling and redness.  ED Course: Initial vitals show patient afebrile, HR 70-90s, SBP 100-120s, SpO2 100% on room air. Initial labs significant for BNP 360, troponin 37-34, WBC 10.9, Hgb 10.3, platelet 148. EKG shows sinus rhythm with RBBB. CXR shows cardiomegaly with small bilateral pleural effusion and left base atelectasis. LE U/S revealed DVT in the left peroneal vein.  Pt received Tylenol  650 mg x 1, IV Lasix  40 mg x 1 and started on heparin infusion.  Patient was admitted to [facility] service and transferred to [facility].  Review of Systems: Please see HPI for pertinent positives and negatives. A complete 10 system review of systems are otherwise negative.  Past Medical History:  Diagnosis Date   Allergy    Arthritis    MINOR ARTHRITIS FEET AND TOES   Asthma    Cataract    CHF (congestive heart failure) (HCC) 11/14/2009   EF45-50%   Coronary artery disease    Glaucoma (increased eye pressure) 04/06/2024   Hyperlipidemia    Myocardial infarction (HCC)    Pneumonia    HX OF PNEUMONIA 4 OR 5 YRS AGO   Rosacea    OF FACE   Shortness of breath    SOMETIMES SOB OR WHEEZING WITH EXERTION   Symptomatic anemia 04/18/2024   Past Surgical History:  Procedure Laterality Date   APPENDECTOMY     AGE 73   CARDIAC CATHETERIZATION  11/2009   mild / mod cad,MODERATE TIGHT STENOSIS IN THE DISTAL LEFT CIRCUMFLEX ARTERY   ELECTROCARDIOGRAM  11/2009   RBBB    ESOPHAGOGASTRODUODENOSCOPY N/A 04/15/2024   Procedure: EGD (ESOPHAGOGASTRODUODENOSCOPY);  Surgeon: Eartha Flavors, Toribio, MD;  Location: [facility]ITE;  Service: Gastroenterology;  Laterality: N/A;   EYE SURGERY     BILATERAL CATARACT EXTRACT EXTRACTION WITH IMPLANTS   INGUINAL HERNIA REPAIR Bilateral 09/29/2013   Procedure: LAPAROSCOPIC BILATERAL  INGUINAL HERNIA;  Surgeon: Elon CHRISTELLA Pacini, MD;  Location: [facility] ORS;  Service: General;  Laterality: Bilateral;   INSERTION OF MESH Bilateral 09/29/2013   Procedure: INSERTION OF MESH;  Surgeon: Elon CHRISTELLA Pacini, MD;  Location: [facility] ORS;  Service: General;  Laterality: Bilateral;   INTRAMEDULLARY (IM) NAIL INTERTROCHANTERIC Right 04/07/2024   Procedure: FIXATION, FRACTURE, INTERTROCHANTERIC, WITH INTRAMEDULLARY ROD;  Surgeon: Georgina Ozell DELENA, MD;  Location: [facility] OR;  Service: Orthopedics;  Laterality: Right;   last echo 02/2009     last nuc 2009  12/16/2007   EF 33%. ABNORMAL. HE HAS APICAL DEFECT CONSISTENT WITH A SCAR.LV FUNCTION MODERATELY  DEPRESSED   US  ECHOCARDIOGRAPHY  02/23/2009   EF 45-50%   US  ECHOCARDIOGRAPHY  12/27/2007   EF 45-50%   Social History:  reports that he has never smoked. He has never used smokeless tobacco. He reports that he does not currently use alcohol . He reports that he does not use drugs.  Allergies  Allergen Reactions   Niaspan  [Niacin ] Other (See Comments)    Intolerance flush feeling when on large dose - is able to take smaller dosage without problem  Meloxicam Other (See Comments)    Disorientation     Family History  Problem Relation Age of Onset   Diabetes Sister    Lung cancer Sister        smoker     Prior to Admission medications   Medication Sig Start Date End Date Taking? Authorizing Provider  albuterol  (VENTOLIN  HFA) 108 (90 Base) MCG/ACT inhaler Inhale 2 puffs into the lungs every 4 (four) hours as needed for wheezing or shortness of breath. 06/19/22  Yes Bernard Drivers, MD  atorvastatin  (LIPITOR)  40 MG tablet TAKE 1 TABLET AT BEDTIME 08/01/23  Yes Nahser, Aleene PARAS, MD  brimonidine  (ALPHAGAN ) 0.2 % ophthalmic solution Place 1 drop into both eyes 2 (two) times daily. 07/20/21  Yes [provider]  cetirizine (ZYRTEC) 10 MG tablet Take 10 mg by mouth at bedtime.   Yes [provider]  Cholecalciferol  (VITAMIN D3) 1000 units CAPS Take 1,000 Units by mouth daily.   Yes [provider]  ferrous sulfate 325 (65 FE) MG tablet Take 1 tablet (325 mg total) by mouth daily with breakfast. 04/12/24 04/12/25 Yes Darci Pore, MD  fish oil-omega-3 fatty acids  1000 MG capsule Take 1 g by mouth 2 (two) times daily.   Yes [provider]  fluticasone  (FLONASE ) 50 MCG/ACT nasal spray Place 1-2 sprays into both nostrils daily as needed for allergies or rhinitis.   Yes [provider]  fluticasone -salmeterol (ADVAIR) 100-50 MCG/ACT AEPB Inhale 1 puff into the lungs 2 (two) times daily.   Yes [provider]  glucosamine-chondroitin 500-400 MG tablet Take 1 tablet by mouth at bedtime.   Yes [provider]  ipratropium-albuterol  (DUONEB) 0.5-2.5 (3) MG/3ML SOLN Take 3 mLs by nebulization every 6 (six) hours as needed (wheezing, shortness of breath). 07/11/21  Yes [provider]  latanoprost  (XALATAN ) 0.005 % ophthalmic solution Place 1 drop into both eyes at bedtime. 06/27/21  Yes [provider]  loperamide (IMODIUM) 2 MG capsule Take 2 mg by mouth 2 (two) times daily as needed for diarrhea or loose stools.   Yes [provider]  metroNIDAZOLE  (METROCREAM ) 0.75 % cream Apply 1 application  topically 2 (two) times daily as needed (for rosacea).   Yes [provider]  mycophenolate  (CELLCEPT ) 500 MG tablet TAKE 1 TABLET BY MOUTH TWICE A DAY Patient taking differently: Take 1,000 mg by mouth 2 (two) times daily. 09/17/23  Yes Mannam, Praveen, MD  NEOMYCIN -POLYMYXIN-HYDROCORTISONE  (CORTISPORIN) 1 % SOLN OTIC solution  Place 1 drop into both ears as needed (when ears are stopped up). 05/13/19  Yes [provider]  pantoprazole (PROTONIX) 40 MG tablet Take 1 tablet (40 mg total) by mouth 2 (two) times daily. 04/21/24  Yes Tat, Alm, MD  potassium chloride  SA (KLOR-CON  M) 20 MEQ tablet Take 1 tablet (20 mEq total) by mouth daily. 03/18/24  Yes Campbell, Kenzie E, NP  predniSONE  (DELTASONE ) 1 MG tablet Take 1 tablet (1 mg total) by mouth at bedtime. Restart after finished with prednisone  taper 04/21/24  Yes Tat, Alm, MD  predniSONE  (DELTASONE ) 5 MG tablet Take 1 tablet (5 mg total) by mouth daily with breakfast. Restart after finished with prednisone  taper 04/21/24  Yes Tat, Alm, MD  sodium chloride  (OCEAN) 0.65 % nasal spray Place 1 spray into the nose as needed for congestion.   Yes [provider]  sucralfate (CARAFATE) 1 GM/10ML suspension Take 10 mLs (1 g total) by mouth 4 (four) times daily -  with meals and at  bedtime. 04/21/24  Yes Tat, Alm, MD  torsemide  (DEMADEX ) 20 MG tablet Take 1 tablet (20 mg total) by mouth daily. 01/31/24  Yes Nahser, Aleene PARAS, MD  amoxicillin -clavulanate (AUGMENTIN ) 875-125 MG tablet Take 1 tablet by mouth every 12 (twelve) hours. Patient not taking: Reported on 05/06/2024 04/21/24   Evonnie Alm, MD  doxycycline (VIBRA-TABS) 100 MG tablet Take 1 tablet (100 mg total) by mouth every 12 (twelve) hours. Patient not taking: Reported on 05/06/2024 04/21/24   Evonnie Alm, MD  predniSONE  (DELTASONE ) 10 MG tablet Take 5 tablets (50 mg total) by mouth daily with breakfast. And decrease by one tablet daily Patient not taking: Reported on 05/06/2024 04/21/24   Evonnie Alm, MD    Physical Exam: BP (!) 118/59 (BP Location: Right Arm)   Pulse 95   Temp 97.6 F (36.4 C) (Oral)   Resp (!) 35   SpO2 98%  General: Pleasant, well-appearing *** laying in bed. No acute distress. HEENT: [place]/AT. Anicteric sclera CV: RRR. No murmurs, rubs, or gallops. No LE edema Pulmonary: Lungs CTAB.  Normal effort. No wheezing or rales. Abdominal: Soft, nontender, nondistended. Normal bowel sounds. Extremities: Palpable radial and DP pulses. Normal ROM. Skin: Warm and dry. No obvious rash or lesions. Neuro: A&Ox3. Moves all extremities. Normal sensation to light touch. No focal deficit. Psych: Normal mood and affect          Labs on Admission:  Basic Metabolic Panel: Recent Labs  Lab 05/06/24 1224  NA 141  K 4.0  CL 103  CO2 26  GLUCOSE 125*  BUN 28*  CREATININE 1.00  CALCIUM  9.4   Liver Function Tests: No results for input(s): AST, ALT, ALKPHOS, BILITOT, PROT, ALBUMIN  in the last 168 hours. No results for input(s): LIPASE, AMYLASE in the last 168 hours. No results for input(s): AMMONIA in the last 168 hours. CBC: Recent Labs  Lab 05/06/24 1224  WBC 10.9*  HGB 10.3*  HCT 33.1*  MCV 105.8*  PLT 148*   Cardiac Enzymes: No results for input(s): CKTOTAL, CKMB, CKMBINDEX, TROPONINI in the last 168 hours. BNP (last 3 results) Recent Labs    09/08/23 0733  BNP 77.7    ProBNP (last 3 results) Recent Labs    05/06/24 1224  PROBNP 360.0*    CBG: No results for input(s): GLUCAP in the last 168 hours.  Radiological Exams on Admission: US  Venous Img Lower Bilateral Result Date: 05/06/2024 CLINICAL DATA:  Bilateral lower extremity edema. EXAM: BILATERAL LOWER EXTREMITY VENOUS DOPPLER ULTRASOUND TECHNIQUE: Gray-scale sonography with graded compression, as well as color Doppler and duplex ultrasound were performed to evaluate the lower extremity deep venous systems from the level of the common femoral vein and including the common femoral, femoral, profunda femoral, popliteal and calf veins including the posterior tibial, peroneal and gastrocnemius veins when visible. The superficial great saphenous vein was also interrogated. Spectral Doppler was utilized to evaluate flow at rest and with distal augmentation maneuvers in the common femoral,  femoral and popliteal veins. COMPARISON:  None Available. FINDINGS: RIGHT LOWER EXTREMITY Common Femoral Vein: No evidence of thrombus. Normal compressibility, respiratory phasicity and response to augmentation. Saphenofemoral Junction: No evidence of thrombus. Normal compressibility and flow on color Doppler imaging. Profunda Femoral Vein: No evidence of thrombus. Normal compressibility and flow on color Doppler imaging. Femoral Vein: No evidence of thrombus. Normal compressibility, respiratory phasicity and response to augmentation. Popliteal Vein: No evidence of thrombus. Normal compressibility, respiratory phasicity and response to augmentation. Calf Veins: No evidence of thrombus.  Normal compressibility and flow on color Doppler imaging. Superficial Great Saphenous Vein: No evidence of thrombus. Normal compressibility. Venous Reflux:  None. Other Findings: No evidence of superficial thrombophlebitis or abnormal fluid collection. LEFT LOWER EXTREMITY Common Femoral Vein: No evidence of thrombus. Normal compressibility, respiratory phasicity and response to augmentation. Saphenofemoral Junction: No evidence of thrombus. Normal compressibility and flow on color Doppler imaging. Profunda Femoral Vein: No evidence of thrombus. Normal compressibility and flow on color Doppler imaging. Femoral Vein: No evidence of thrombus. Normal compressibility, respiratory phasicity and response to augmentation. Popliteal Vein: No evidence of thrombus. Normal compressibility, respiratory phasicity and response to augmentation. Calf Veins: Thrombus in the visualized left peroneal vein. The posterior tibial vein appears normally patent. Superficial Great Saphenous Vein: No evidence of thrombus. Normal compressibility. Venous Reflux:  None. Other Findings: No evidence of superficial thrombophlebitis or abnormal fluid collection. IMPRESSION: 1. Deep vein thrombosis in the left peroneal vein. 2. No evidence of deep vein thrombosis in  the right lower extremity. Electronically Signed   By: Marcey Moan M.D.   On: 05/06/2024 14:11   DG Chest 2 View Result Date: 05/06/2024 EXAM: 2 VIEW(S) XRAY OF THE CHEST 05/06/2024 11:55:00 AM COMPARISON: 04/15/2024. Chest CT of 04/16/2024. CLINICAL HISTORY: SHOB. Table formatting from the original note was not included.; Images from the original note were not included.; Reports bilateral leg swelling and redness. Hx of cellulitis. Recently in ICU for similar swelling. FINDINGS: LUNGS AND PLEURA: Hyperinflation. Right upper lobe pulmonary nodule was detailed on prior chest CT. Left base scarring and/or subsegmental atelectasis. Tiny bilateral pleural effusions. No pulmonary edema. No pneumothorax. HEART AND MEDIASTINUM: Mild cardiomegaly. Transverse aortic atherosclerosis. BONES AND SOFT TISSUES: No acute osseous abnormality. IMPRESSION: 1. Cardiomegaly with small bilateral pleural effusions and left base atelectasis. 2. . Right upper lobe pulmonary nodule, as on prior CT. Electronically signed by: Rockey Kilts MD 05/06/2024 01:32 PM EDT RP Workstation: [id number]   Assessment/Plan Jeffrey Stewart is a 89 y.o. male with medical history significant for  chronic diastolic HF, MI/CAD, interstitial lung disease, rheumatoid arthritis, RBBB, right hip fracture s/p ORIF 03/2024, anemia, allergies, arthritis, HTN, HLD and a recent hospitalization for GI bleed and hemorrhagic shock who presented to [facility] ED for evaluation of bilateral lower extremity swelling and redness and admitted for acute DVT in the left leg.  # Acute LLE DVT  # Acute on chronic diastolic HF  #***  #***  #***  #***  #***  DVT prophylaxis: Heparin    Code Status: Prior  Consults called: None  Family Communication: ***  Severity of Illness: The appropriate patient status for this patient is INPATIENT. Inpatient status is judged to be reasonable and necessary in order to provide the required intensity of service to  ensure the patient's safety. The patient's presenting symptoms, physical exam findings, and initial radiographic and laboratory data in the context of their chronic comorbidities is felt to place them at high risk for further clinical deterioration. Furthermore, it is not anticipated that the patient will be medically stable for discharge from the hospital within 2 midnights of admission.   * I certify that at the point of admission it is my clinical judgment that the patient will require inpatient hospital care spanning beyond 2 midnights from the point of admission due to high intensity of service, high risk for further deterioration and high frequency of surveillance required.*  Level of care: Telemetry Medical    Lou Claretta HERO, MD 05/06/2024, 9:07 PM [facility] Pager:  [phone] Isaiah 41:10   If 7PM-7AM, please contact night-coverage [email or web address] Password [email or web address]

## 2024-05-06 NOTE — ED Notes (Signed)
 Report called to [facility], RN at [facility].

## 2024-05-06 NOTE — Progress Notes (Signed)
 PHARMACY - ANTICOAGULATION CONSULT NOTE  Pharmacy Consult for heparin Indication: DVT  Allergies  Allergen Reactions   Niaspan  [place].Hair ] Other (See Comments)    Intolerance flush feeling when on large dose - is able to take smaller dosage without problem    Meloxicam Other (See Comments)    Disorientation     Patient Measurements:    Vital Signs: Temp: 98.1 F (36.7 C) (10/21 1133) BP: 120/61 (10/21 1615) Pulse Rate: 97 (10/21 1600)  Labs: Recent Labs    05/06/24 1224  HGB 10.3*  HCT 33.1*  PLT 148*  CREATININE 1.00    CrCl cannot be calculated (Unknown ideal weight.).   Medical History: Past Medical History:  Diagnosis Date   Allergy    Arthritis    MINOR ARTHRITIS FEET AND TOES   Asthma    Cataract    CHF (congestive heart failure) (HCC) 11/14/2009   EF45-50%   Coronary artery disease    Glaucoma (increased eye pressure) 04/06/2024   Hyperlipidemia    Myocardial infarction (HCC)    Pneumonia    HX OF PNEUMONIA 4 OR 5 YRS AGO   Rosacea    OF FACE   Shortness of breath    SOMETIMES SOB OR WHEEZING WITH EXERTION   Symptomatic anemia 04/18/2024    Assessment: 89 YOM presenting with leg swelling, positive ultrasound for DVT, he is not on anticoagulation PTA, chronic anemia stable however recent GI bleed hx  Goal of Therapy:  Heparin level 0.3-0.7 units/ml Monitor platelets by anticoagulation protocol: Yes   Plan:  Heparin 4000 units IV x 1, and gtt at 1500 units/hr F/u 8 hour heparin level F/u long term [facility] plan  Dorn Poot, PharmD, [facility] Clinical Pharmacist ED Pharmacist Phone # [phone] 05/06/2024 4:50 PM

## 2024-05-06 NOTE — Progress Notes (Signed)
 [facility] [place]  Return Visit Jeffrey Stewart DOB: 06/21/1935  MRN: [id number] Visit Date: 05/06/2024  Encounter Provider: Bernardino JAYSON Level, FNP  Subjective:   Jeffrey Stewart is a 89 y.o. male presenting for hospital follow-up.  Was admitted to the hospital from 04/14/2024 -04/21/2024 for GI hemorrhage  History of Present Illness The patient is an 89 year old male who presents for a hospital follow-up. He was admitted to the hospital from 04/14/2024 through 04/21/2024. He has a medical history significant for congestive heart failure, interstitial lung disease, right bundle branch block, rheumatoid arthritis, and coronary artery disease. He was brought to the ER via EMS with reports of vomiting blood and blood in the stool, which started the morning of his hospital admission. The patient's spouse confirmed that the vomit contained golf ball-sized blood clots. He was not aware of having any black stools. He reported some lower abdominal pain earlier that day, which resolved by the time he reached the ER. He has a history of hemorrhoids that occasionally bleed, but not to this extent. He has never vomited blood. He does not use any NSAIDs.  He was recently hospitalized before this for a hip fracture (also in September 2025). His most recent hospitalization was prolonged secondary to hemorrhagic shock. During hospitalization, his hematemesis resolved. He underwent EGD, which showed three nonbleeding cratered gastric ulcers. They were injected with epinephrine  and treated with bipolar cautery. He was started on Protonix IV twice daily and Carafate, and required transfusion with 5 units of packed red blood cells total. He experienced prolonged hypotension, requiring a Levophed drip. His hemoglobin eventually stabilized, and he did not have any further bloody bowel movements. He was started on low-dose midodrine. A CT chest did reveal a new irregular nodular density in the right upper lobe,  which needs outpatient follow-up and surveillance. This was treated empirically as aspiration pneumonitis.  He was discharged home with a steroid taper. His hemoglobin was 8.0 on the day of discharge. For his acute on chronic kidney disease, stage 3a lactic acidosis, and hyperkalemia, he was treated with IV dextrose  and insulin initially. Renal ultrasound was negative for hydronephrosis. Lactic acid trended down with IV fluid, overall improved. Serum creatinine was 0.89 at discharge.  New irregular nodular density seen in the right upper lung lobe. Left lower lobe nodule found as well. The right upper lobe nodule was 1.0 x 2.2 cm. He was told to do outpatient follow-up with pulmonology and surveillance CT. He was told to resume his home eyedrops for his glaucoma. For the chronic heart failure, he was told to hold the torsemide  temporarily (but is now back on this medication). He was also told to hold aspirin  and all anticoagulation at discharge.  UPDATES TODAY: He reports feeling sore and experiencing worsening leg and foot swelling (especially on the RIGHT), which has not improved despite elevating his feet and removing his socks. He is currently on torsemide  20 mg, but it does not seem to alleviate the swelling. The swelling increases when he lowers his feet and decreases slightly upon elevation. He has been using Ace bandages as he is unable to wear compression socks. The right foot is causing more discomfort than the left, with weeping, redness, and warmth present. He was discharged with an antibiotic, which he took for 2 days post-discharge, but he was on antibiotics throughout his hospital stay. The redness in his leg started on Sunday and has been wrapping it daily since then. He has noticed blisters forming on  his foot when he keeps it down. He has a history of heart failure.  He has not seen his cardiologist for some time as he retired.  He was previously on furosemide  but was switched to torsemide  as  the former was not effective. His feet did not appear this way when he last saw his cardiologist. He uses a pillow for elevation as suggested by his doctor and usually wears compression socks.  Wife states that redness has been spreading up his RIGHT leg and swelling is becoming worse.  He also sees a Diplomatic Services operational officer, Solicitor, and an Best boy  Tobacco: The patient does not smoke cigarettes.   Medical History[1] Family History[2] Social History[3] Surgical History[4] Current Medications[5] Allergies[6] Patient Active Problem List   Diagnosis Date Noted  . Interstitial pulmonary disease (HCC) 05/29/2022  . Thrombocytopenia (HCC) 04/25/2021  . Chronic fatigue syndrome 04/06/2021  . Overweight 04/06/2021  . Polyarthralgia 04/06/2021  . Polymyalgia rheumatica (HCC) 04/06/2021  . Macrocytosis 01/21/2021  . Abnormal computed tomography of gastrointestinal tract 12/29/2020  . Abdominal pain 12/29/2020  . Blood in stool 12/29/2020  . 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 Laryngopharyngeal reflux (LPR) 06/01/2020  . Arthralgia of left ankle 01/20/2019  . Acute otitis externa of right ear 03/08/2016  . Seborrheic keratosis, inflamed 10/08/2015  . Allergic rhinitis 09/13/2015  . CAD (coronary artery disease) 09/13/2015  . CHF (congestive heart failure) (HCC) 09/13/2015  . COPD (chronic obstructive pulmonary disease) (HCC) 09/13/2015  . 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 Hypercholesterolemia 09/13/2015  . RBBB 09/13/2015  . Rosacea 09/13/2015  . Hearing loss 09/13/2015  . Arthritis 09/13/2015  . Essential hypertension 09/13/2015  . Bilateral inguinal hernia 09/19/2013     Review of Systems  Constitutional:  Negative for activity change, appetite change, chills, diaphoresis, fatigue and fever.  HENT:  Negative for congestion, ear pain, hearing loss, sinus pressure, sneezing, sore throat and trouble swallowing.   Eyes:  Negative for photophobia and visual disturbance.   Respiratory:  Negative for cough, chest tightness, shortness of breath and wheezing.   Cardiovascular:  Positive for leg swelling (bilateral). Negative for chest pain and palpitations.  Gastrointestinal:  Negative for abdominal distention, abdominal pain, constipation, diarrhea, nausea and vomiting.  Endocrine: Negative for cold intolerance and heat intolerance.  Genitourinary:  Negative for difficulty urinating, dysuria and hematuria.  Musculoskeletal:  Negative for arthralgias, back pain, joint swelling and myalgias.  Skin:  Positive for color change and wound.  Neurological:  Negative for dizziness, light-headedness, numbness and headaches.  Hematological:  Negative for adenopathy. Does not bruise/bleed easily.  Psychiatric/Behavioral:  Negative for agitation, behavioral problems, confusion and decreased concentration. The patient is not nervous/anxious.      Objective:  BP (!) 127/52   Pulse 70   Temp 98.6 F (37 C)   Resp 16   SpO2 95%   Physical Exam Vitals and nursing note reviewed.  Constitutional:      Appearance: Normal appearance. He is normal weight.  HENT:     Head: Normocephalic and atraumatic.  Cardiovascular:     Rate and Rhythm: Normal rate and regular rhythm.     Pulses: Normal pulses.     Heart sounds: Normal heart sounds.  Pulmonary:     Effort: Pulmonary effort is normal. No respiratory distress.     Breath sounds: Normal breath sounds. No wheezing.  Abdominal:     General: Bowel sounds are normal. There is distension.     Palpations: Abdomen is soft.  Tenderness: There is no abdominal tenderness. There is no guarding.  Musculoskeletal:        General: Normal range of motion.     Cervical back: Normal range of motion and neck supple.     Right lower leg: Edema (3-4+ - foot and leg; weeping) present.     Left lower leg: Edema (2-3+ - mainly foot) present.  Skin:    General: Skin is warm and dry.     Findings: Bruising and erythema present.   Neurological:     General: No focal deficit present.     Mental Status: He is alert and oriented to person, place, and time.  Psychiatric:        Mood and Affect: Mood normal.        Behavior: Behavior normal.        Labs: Recent Results (from the past week)  Basic Metabolic Panel   Collection Time: 04/30/24  8:30 AM  Result Value Ref Range   Sodium 140 136 - 145 mmol/L   Potassium 4.5 3.5 - 5.1 mmol/L   Chloride 102 98 - 107 mmol/L   CO2 32 (H) 21 - 31 mmol/L   Anion Gap 6 6 - 14 mmol/L   Glucose, Random 116 (H) 70 - 99 mg/dL   Blood Urea Nitrogen (BUN) 25 7 - 25 mg/dL   Creatinine 9.10 9.29 - 1.30 mg/dL   eGFR 83 >40 fO/fpw/8.26f7   Calcium  8.2 (L) 8.6 - 10.3 mg/dL   BUN/Creatinine Ratio    CBC with Differential   Collection Time: 04/30/24  8:30 AM  Result Value Ref Range   WBC 11.20 (H) 4.40 - 11.00 10*3/uL   RBC 3.41 (L) 4.50 - 5.90 10*6/uL   Hemoglobin 10.9 (L) 14.0 - 17.5 g/dL   Hematocrit 66.2 (L) 58.4 - 50.4 %   Mean Corpuscular Volume (MCV) 98.9 (H) 80.0 - 96.0 fL   Mean Corpuscular Hemoglobin (MCH) 32.1 27.5 - 33.2 pg   Mean Corpuscular Hemoglobin Conc (MCHC) 32.4 (L) 33.0 - 37.0 g/dL   Red Cell Distribution Width (RDW) 24.0 (H) 12.3 - 17.0 %   Platelet Count (PLT) 189 150 - 450 10*3/uL   Mean Platelet Volume (MPV) 7.4 6.8 - 10.2 fL   Neutrophils % 83 %   Lymphocytes % 6 %   Monocytes % 5 %   Eosinophils % 2 %   Basophils % 0 %   Bands % 1 <5 %   Myelocyte % 4 %   Neutrophil Absolute (Man Diff) 9.30 (H) 1.80 - 7.80 10*3/uL   Lymphocytes Absolute (Man Diff) 0.70 (L) 1.00 - 4.80 10*3/uL   Monocytes Absolute (Man Diff) 0.60 0.00 - 0.80 10*3/uL   Eosinophils Absolute (Man Diff) 0.20 0.00 - 0.50 10*3/uL   Basophils Absolute (Man Diff) 0.00 0.00 - 0.20 10*3/uL   Bands Absolute (Man Diff) 0.10 10*3/uL   Myelocytes Absolute (Man Diff) 0.40 10*3/uL   RBC & PLT Morphology Reviewed    Dacrocytes/Tear Drop 1+    Ovalocytes 1+    Poikilocytes 2+     Polychromasia 1+      Assessment/Plan:   Assessment & Plan 1. Possible Cellulitis: - Blisters and weeping sores on the foot are not typical post-hospitalization symptoms. The swelling is unlikely to be managed effectively with oral antibiotics or increased doses of torsemide ; intravenous medication may be required. - The right foot and leg are hot to the touch and exhibits significant redness and swelling. The right foot is worse than the  left, which has stopped weeping. - It was recommended that he return to the ER for further evaluation of potential cellulitis or a possible blood clot. - Patient and family confirmed that he will return to the ER immediately for management.  2. Right parotid mass: - A CT scan of the head without contrast conducted on 04/06/2024 revealed a 1.2 cm oval mass in the posterior aspect of the right parotid gland. - An MRI with and without contrast is recommended for further evaluation. - Will order this if hospital unable to perform  3. Lung nodules: - A CT chest revealed a new irregular nodular density in the right upper lobe (1.0 x 2.2 cm) and a smaller nodule (5 x 5 mm) in the left lower lobe. - Outpatient follow-up with pulmonology and a surveillance CT scan are recommended to monitor these findings.  4. Chronic heart failure: - He has resumed taking 20 mg of torsemide , but the current dose is not effectively managing his foot swelling. Intravenous diuretics may be necessary to control the edema.  5. Hemorrhagic shock: - He experienced hemorrhagic shock due to multiple gastric ulcers and required ICU admission with pressor support for hypotension. He was treated with Protonix IV twice daily and Carafate, and received a transfusion of 5 units of packed red blood cells.  6. Acute on chronic kidney disease, stage IIIa: - He was treated for lactic acidosis and hyperkalemia with IV dextrose  and insulin initially. His serum creatinine was 0.89 at  discharge.  7. Glaucoma: - He was advised to resume his home eyedrops for glaucoma management.  1. Hospital discharge follow-up (Primary)  2. Acute GI bleeding  3. Upper GI bleed  4. Bilateral lower extremity edema  5. Leg swelling  6. Chronic diastolic congestive heart failure    (CMD)  7. Interstitial pulmonary disease (HCC)  8. Chronic obstructive pulmonary disease, unspecified COPD type  9. Coronary artery disease involving native coronary artery of native heart without angina pectoris  10. Essential hypertension  11. Mass of right parotid gland   Problem List Items Addressed This Visit     CAD (coronary artery disease)   CHF (congestive heart failure) (HCC)   COPD (chronic obstructive pulmonary disease) (HCC)   Essential hypertension   Interstitial pulmonary disease (HCC)   Other Visit Diagnoses       Hospital discharge follow-up    -  Primary     Acute GI bleeding         Upper GI bleed         Bilateral lower extremity edema         Leg swelling         Mass of right parotid gland              No follow-ups on file.   There are no Patient Instructions on file for this visit.   Please contact my office for worsening conditions or problems, and seek emergency medical treatment and/or call 911 if you or your family deems either necessary.  I have personally spent 50 minutes involved in face-to-face and non-face-to-face activities for this patient on the day of the visit.  Professional time spent includes the following activities, in addition to those noted in the documentation:  - preparing to see the patient (e.g., review of recent and/or remote lab/imaging/study results, provider notes, and patient messages/phone calls available in current EMR, CareEverywhere, and scanned records) -obtaining and/or reviewing separately obtained history either through past provider notes, patient phone  calls, and/or patient's family member(s)/caregiver(s) -performing a  medically appropriate examination and/or evaluation -counseling and educating the patient/family/caregiver -ordering medications, tests, or procedures -documenting clinical information in the electronic or other health record -reviewing most up to date studies or expert consensus guidelines for screening/diagnosing/treating pertinent conditions/symptoms -independently interpreting results (not separately reported) and communicating results to the patient/family/caregiver -care coordination (not separately reported) -referring and communicating with other health care professionals (when not separately reported)   This document was created with the assistance of Dragon voice recognition software.  Electronically signed by: Bernardino JAYSON Level, FNP 05/06/2024 10:26 AM       [1] Past Medical History: Diagnosis Date  . Allergic rhinitis   . Allergy   . Arthritis   . CAD (coronary artery disease)   . CHF (congestive heart failure)    (CMD)   . COPD (chronic obstructive pulmonary disease)   . Hearing loss   . Hypercholesteremia   . Hypercholesterolemia   . RBBB   . Rosacea   [2] Family History Problem Relation Name Age of Onset  . Asthma Sister    . Asthma Brother    . COPD Mother    [3] Social History Socioeconomic History  . Marital status: Married  Tobacco Use  . Smoking status: Never  . Smokeless tobacco: Never  Vaping Use  . Vaping status: Never Used  Substance and Sexual Activity  . Alcohol  use: Yes  . Drug use: Never   Social Drivers of Health   Food Insecurity: Low Risk  (05/06/2024)   Food vital sign   . Within the past 12 months, you worried that your food would run out before you got money to buy more: Never true   . Within the past 12 months, the food you bought just didn't last and you didn't have money to get more: Never true  Transportation Needs: No Transportation Needs (05/06/2024)   Transportation   . In the past 12 months, has lack of reliable  transportation kept you from medical appointments, meetings, work or from getting things needed for daily living? : No  Safety: Low Risk  (05/06/2024)   Safety   . How often does anyone, including family and friends, physically hurt you?: Never   . How often does anyone, including family and friends, insult or talk down to you?: Never   . How often does anyone, including family and friends, threaten you with harm?: Never   . How often does anyone, including family and friends, scream or curse at you?: Never  Living Situation: Low Risk  (05/06/2024)   Living Situation   . What is your living situation today?: I have a steady place to live   . Think about the place you live. Do you have problems with any of the following? Choose all that apply:: None/None on this list  [4] Past Surgical History: Procedure Laterality Date  . APPENDECTOMY     Procedure: APPENDECTOMY  . EYE SURGERY     Procedure: EYE SURGERY  . HIP SURGERY    . MOUTH SURGERY      Procedure: MOUTH SURGERY; tooth extraction  [5]  Current Outpatient Medications:  .  albuterol  HFA (PROVENTIL  HFA;VENTOLIN  HFA;PROAIR  HFA) 90 mcg/actuation inhaler, INHALE 2 PUFFS INTO THE LUNGS EVERY 6 HOURS AS NEEDED FOR WHEEZE, Disp: 20.1 each, Rfl: 1 .  amoxicillin -pot clavulanate (AUGMENTIN ) 875-125 mg per tablet, Take 1 tablet by mouth 2 (two) times a day., Disp: , Rfl:  .  aspirin  81 mg EC  tablet, Take 81 mg by mouth., Disp: , Rfl:  .  atorvastatin  (LIPITOR) 40 mg tablet, , Disp: , Rfl:  .  brimonidine  (ALPHAGAN ) 0.2 % ophthalmic solution, 1 drop in the morning and 1 drop in the evening., Disp: , Rfl:  .  carvediloL  (COREG ) 6.25 mg tablet, Take 3.125 mg by mouth., Disp: , Rfl:  .  cetirizine (ZyrTEC) 10 mg tablet, Take 10 mg by mouth daily., Disp: , Rfl:  .  cholecalciferol  (VITAMIN D3) 1,000 unit (25 mcg) tablet, Take 1,000 Units by mouth daily., Disp: , Rfl:  .  doxycycline (VIBRAMYCIN) 100 mg capsule, Take 100 mg by mouth 2 (two) times a  day. Take with 8 oz water. Do not lie down for at least 30 minutes after. Added per [facility] dc summary 04/21/24, Disp: , Rfl:  .  ferrous sulfate 325 mg (65 mg iron) tablet, Take 325 mg by mouth daily with breakfast., Disp: , Rfl:  .  fluticasone  propion-salmeteroL (ADVAIR DISKUS) 100-50 mcg/dose diskus inhaler, INHALE 1 PUFF IN THE MORNING AND 1 PUFF BEFORE BEDTIME, Disp: 1 each, Rfl: 5 .  fluticasone  propionate (FLONASE ) 50 mcg/spray nasal spray, USE 1 TO 2 SPRAYS IN EACH NOSTRIL EVERY DAY, Disp: 48 g, Rfl: 3 .  glucosamine-chondroitin (CIDAFLEX) 500-400 mg tab per tablet, Take 1 tablet by mouth 3 (three) times a day., Disp: , Rfl:  .  ipratropium-albuteroL  (DUO-NEB) 0.5-2.5 mg/3 mL nebulizer solution, INHALE 3 MLS BY NEBULIZATION EVERY 6 HOURS AS NEEDED FOR WHEEZE OR FOR SHORTNESS OF BREATH, Disp: 180 mL, Rfl: 0 .  latanoprost  (XALATAN ) 0.005 % ophthalmic solution, Administer 1 drop into both eyes nightly., Disp: , Rfl:  .  lisinopriL  (PRINIVIL ) 5 mg tablet, Take 2.5 mg by mouth daily., Disp: , Rfl:  .  loperamide (IMODIUM) 2 mg capsule, Take 2 mg by mouth., Disp: , Rfl:  .  metroNIDAZOLE  (METROCREAM ) 0.75 % cream, APPLY TOPICALLY 2 (TWO) TIMES A DAY., Disp: 135 g, Rfl: 0 .  miscellaneous medical supply (C-Tub) misc, Needs left surgical shoe for M25.572 and R60.0, Disp: 1 each, Rfl: 0 .  miscellaneous medical supply (C-Tub) misc, Needs nebulizer machine with mask, tubing, and other supplies for COPD, bronchitis, and ILD (ICD-10: J44.9, J20.9 J84.9), Disp: 1 each, Rfl: 0 .  mycophenolate  (CELLCEPT ) 500 mg tablet, , Disp: , Rfl:  .  neomycin -polymyxin-HC (CORTISPORIN) 3.5-10,000-1 mg/mL-unit/mL-% otic solution, 3 drops 4 (four) times a day., Disp: , Rfl:  .  omega 3-dha-epa-fish oil (OMEGA 3) 1,000 mg capsule, Take 1 g by mouth daily., Disp: , Rfl:  .  pantoprazole (PROTONIX) 40 mg EC tablet, Take 40 mg by mouth 2 (two) times a day. Added per [facility] dc summary 04/21/24, Disp: , Rfl:  .  potassium chloride   (KLOR-CON ) 20 mEq ER tablet, , Disp: , Rfl:  .  predniSONE  (DELTASONE ) 1 mg tablet, Take 6 mg by mouth daily., Disp: , Rfl:  .  predniSONE  (DELTASONE ) 10 mg tablet, Take 10 mg by mouth daily. 5 tablets (50 mg) daily with breakfast and decrease by 1 tablet daily Added per [facility] dc summary 04/21/24, Disp: , Rfl:  .  predniSONE  (DELTASONE ) 5 mg tablet, Take 5 mg by mouth daily with breakfast. Restart after finished with Prednisone  taper Added per [facility] dc summary 04/21/24, Disp: , Rfl:  .  promethazine -dextromethorphan  (PHENERGAN  DM) 6.25-15 mg/5 mL syrp syrup, Take 5 mL by mouth every 6 (six) hours as needed (for cough)., Disp: 118 mL, Rfl: 0 .  sodium chloride  (AYR) 0.65 %  drop nasal drops, 1 drop., Disp: , Rfl:  .  sucralfate (CARAFATE) 100 mg/mL oral suspension, Take 1 g by mouth 4 (four) times a day with meals and nightly. Added per [facility] dc summary 04/21/24, Disp: , Rfl:  .  torsemide  (DEMADEX ) 20 mg tablet, Take 20 mg by mouth daily., Disp: , Rfl:  .  turm-ging-bos-yuc-wil-cham-hor (Tumersaid) 100-100-100-125 mg tab, Take by mouth., Disp: , Rfl:  .  ZyrTEC 10 mg cap, , Disp: , Rfl:  .  aspirin  81 mg EC tablet, Take one tablet daily (Patient not taking: Reported on 04/14/2024), Disp: , Rfl:  .  carvediloL  (COREG ) 12.5 mg tablet, , Disp: , Rfl:  .  nitroglycerin  (NITROSTAT ) 0.4 mg SL tablet, Place  under the tongue every 5 (five) minutes as needed for chest pain. (Patient not taking: Reported on 04/14/2024), Disp: , Rfl:  [6] Allergies Allergen Reactions  . Niacin  Other (See Comments) and Flushing    Intolerance  Flush feeling WHEN ON LARGE DOSE - IS ABLE TO TAKE SMALLER DOSAGE WITHOUT PROB, I  . Meloxicam Other (See Comments)    Disorientation

## 2024-05-06 NOTE — ED Provider Notes (Signed)
 [place] EMERGENCY DEPARTMENT AT [facility] Provider Note   CSN: [id number] Arrival date & time: 05/06/24  1127     Patient presents with: Leg Swelling   Jeffrey Stewart is a 89 y.o. male with a past medical history significant for CHF, interstitial lung disease, RA, and CAD who presents to the ED due to bilateral lower extremity edema.  Patient recently admitted to the hospital on 9/29 to 10/6 due to GI bleed secondary to multiple gastric ulcers complicated by hemorrhagic shock.  Patient denies any blood thinners.  Notes he was significantly edematous during his admission which was thought to be related to IVFs per patient.  He notes his edema has worsened on his right lower extremity and bilateral feet.  Notes he started to develop a purpleish coloration to right lower extremity for the past 3 days.  No known injury.  Denies chest pain and shortness of breath.  Sleeps in a recliner.  Currently on torsemide  which he has been compliant with.  Prior to the most recent admission patient also admitted to hospital 9/29 to 9/27 due to hip fracture.  Patient had ORIF with intramedullary rodding on 9/22.   History obtained from patient and past medical records. No interpreter used during encounter.       Prior to Admission medications   Medication Sig Start Date End Date Taking? Authorizing Provider  albuterol  (VENTOLIN  HFA) 108 (90 Base) MCG/ACT inhaler Inhale 2 puffs into the lungs every 4 (four) hours as needed for wheezing or shortness of breath. 06/19/22  Yes Bernard Drivers, MD  atorvastatin  (LIPITOR) 40 MG tablet TAKE 1 TABLET AT BEDTIME 08/01/23  Yes Nahser, Aleene PARAS, MD  brimonidine  (ALPHAGAN ) 0.2 % ophthalmic solution Place 1 drop into both eyes 2 (two) times daily. 07/20/21  Yes [provider]  cetirizine (ZYRTEC) 10 MG tablet Take 10 mg by mouth at bedtime.   Yes [provider]  Cholecalciferol  (VITAMIN D3) 1000 units CAPS Take 1,000 Units by mouth daily.   Yes  [provider]  ferrous sulfate 325 (65 FE) MG tablet Take 1 tablet (325 mg total) by mouth daily with breakfast. 04/12/24 04/12/25 Yes Darci Pore, MD  fish oil-omega-3 fatty acids  1000 MG capsule Take 1 g by mouth 2 (two) times daily.   Yes [provider]  fluticasone  (FLONASE ) 50 MCG/ACT nasal spray Place 1-2 sprays into both nostrils daily as needed for allergies or rhinitis.   Yes [provider]  fluticasone -salmeterol (ADVAIR) 100-50 MCG/ACT AEPB Inhale 1 puff into the lungs 2 (two) times daily.   Yes [provider]  glucosamine-chondroitin 500-400 MG tablet Take 1 tablet by mouth at bedtime.   Yes [provider]  ipratropium-albuterol  (DUONEB) 0.5-2.5 (3) MG/3ML SOLN Take 3 mLs by nebulization every 6 (six) hours as needed (wheezing, shortness of breath). 07/11/21  Yes [provider]  latanoprost  (XALATAN ) 0.005 % ophthalmic solution Place 1 drop into both eyes at bedtime. 06/27/21  Yes [provider]  loperamide (IMODIUM) 2 MG capsule Take 2 mg by mouth 2 (two) times daily as needed for diarrhea or loose stools.   Yes [provider]  metroNIDAZOLE  (METROCREAM ) 0.75 % cream Apply 1 application  topically 2 (two) times daily as needed (for rosacea).   Yes [provider]  mycophenolate  (CELLCEPT ) 500 MG tablet TAKE 1 TABLET BY MOUTH TWICE A DAY Patient taking differently: Take 1,000 mg by mouth 2 (two) times daily. 09/17/23  Yes Mannam, Praveen, MD  NEOMYCIN -POLYMYXIN-HYDROCORTISONE  (  CORTISPORIN) 1 % SOLN OTIC solution Place 1 drop into both ears as needed (when ears are stopped up). 05/13/19  Yes [provider]  pantoprazole (PROTONIX) 40 MG tablet Take 1 tablet (40 mg total) by mouth 2 (two) times daily. 04/21/24  Yes Tat, Alm, MD  potassium chloride  SA (KLOR-CON  M) 20 MEQ tablet Take 1 tablet (20 mEq total) by mouth daily. 03/18/24  Yes Campbell, Kenzie E, NP  predniSONE  (DELTASONE ) 1 MG  tablet Take 1 tablet (1 mg total) by mouth at bedtime. Restart after finished with prednisone  taper 04/21/24  Yes Tat, Alm, MD  predniSONE  (DELTASONE ) 5 MG tablet Take 1 tablet (5 mg total) by mouth daily with breakfast. Restart after finished with prednisone  taper 04/21/24  Yes Tat, Alm, MD  sodium chloride  (OCEAN) 0.65 % nasal spray Place 1 spray into the nose as needed for congestion.   Yes [provider]  sucralfate (CARAFATE) 1 GM/10ML suspension Take 10 mLs (1 g total) by mouth 4 (four) times daily -  with meals and at bedtime. 04/21/24  Yes Tat, Alm, MD  torsemide  (DEMADEX ) 20 MG tablet Take 1 tablet (20 mg total) by mouth daily. 01/31/24  Yes Nahser, Aleene PARAS, MD  amoxicillin -clavulanate (AUGMENTIN ) 875-125 MG tablet Take 1 tablet by mouth every 12 (twelve) hours. Patient not taking: Reported on 05/06/2024 04/21/24   Evonnie Alm, MD  doxycycline (VIBRA-TABS) 100 MG tablet Take 1 tablet (100 mg total) by mouth every 12 (twelve) hours. Patient not taking: Reported on 05/06/2024 04/21/24   Evonnie Alm, MD  predniSONE  (DELTASONE ) 10 MG tablet Take 5 tablets (50 mg total) by mouth daily with breakfast. And decrease by one tablet daily Patient not taking: Reported on 05/06/2024 04/21/24   Evonnie Alm, MD    Allergies: Niaspan  [niacin ] and Meloxicam    Review of Systems  Constitutional:  Negative for chills and fever.  Respiratory:  Negative for shortness of breath.   Cardiovascular:  Positive for leg swelling. Negative for chest pain.  Gastrointestinal:  Negative for abdominal pain.    Updated Vital Signs BP (!) 120/54   Pulse 97   Temp 98.1 F (36.7 C)   Resp 20   SpO2 98%   Physical Exam Vitals and nursing note reviewed.  Constitutional:      General: He is not in acute distress.    Appearance: He is not ill-appearing.  HENT:     Head: Normocephalic.  Eyes:     Pupils: Pupils are equal, round, and reactive to light.  Cardiovascular:     Rate and Rhythm: Normal rate  and regular rhythm.     Pulses: Normal pulses.     Heart sounds: Normal heart sounds. No murmur heard.    No friction rub. No gallop.  Pulmonary:     Effort: Pulmonary effort is normal.     Breath sounds: Normal breath sounds.  Abdominal:     General: Abdomen is flat. There is no distension.     Palpations: Abdomen is soft.     Tenderness: There is no abdominal tenderness. There is no guarding or rebound.  Musculoskeletal:        General: Normal range of motion.     Cervical back: Neck supple.     Comments: Plus pitting edema to bilateral feet.  2+ pitting edema to right lower extremity with purple discoloration.  See photos above.  Skin:    General: Skin is warm and dry.  Neurological:     General: No focal  deficit present.     Mental Status: He is alert.  Psychiatric:        Mood and Affect: Mood normal.        Behavior: Behavior normal.     (all labs ordered are listed, but only abnormal results are displayed) Labs Reviewed  BASIC METABOLIC PANEL WITH GFR - Abnormal; Notable for the following components:      Result Value   Glucose, Bld 125 (*)    BUN 28 (*)    All other components within normal limits  CBC - Abnormal; Notable for the following components:   WBC 10.9 (*)    RBC 3.13 (*)    Hemoglobin 10.3 (*)    HCT 33.1 (*)    MCV 105.8 (*)    RDW 22.6 (*)    Platelets 148 (*)    All other components within normal limits  PRO BRAIN NATRIURETIC PEPTIDE - Abnormal; Notable for the following components:   Pro Brain Natriuretic Peptide 360.0 (*)    All other components within normal limits  TROPONIN T, HIGH SENSITIVITY - Abnormal; Notable for the following components:   Troponin T High Sensitivity 37 (*)    All other components within normal limits  TROPONIN T, HIGH SENSITIVITY - Abnormal; Notable for the following components:   Troponin T High Sensitivity 34 (*)    All other components within normal limits    EKG: EKG Interpretation Date/Time:  Tuesday May 06 2024 12:23:23 EDT Ventricular Rate:  87 PR Interval:  161 QRS Duration:  141 QT Interval:  386 QTC Calculation: 465 R Axis:   -10  Text Interpretation: Sinus rhythm Right bundle branch block Confirmed by Ula Barter [phone]) on 05/06/2024 12:34:26 PM  Radiology: US  Venous Img Lower Bilateral Result Date: 05/06/2024 CLINICAL DATA:  Bilateral lower extremity edema. EXAM: BILATERAL LOWER EXTREMITY VENOUS DOPPLER ULTRASOUND TECHNIQUE: Gray-scale sonography with graded compression, as well as color Doppler and duplex ultrasound were performed to evaluate the lower extremity deep venous systems from the level of the common femoral vein and including the common femoral, femoral, profunda femoral, popliteal and calf veins including the posterior tibial, peroneal and gastrocnemius veins when visible. The superficial great saphenous vein was also interrogated. Spectral Doppler was utilized to evaluate flow at rest and with distal augmentation maneuvers in the common femoral, femoral and popliteal veins. COMPARISON:  None Available. FINDINGS: RIGHT LOWER EXTREMITY Common Femoral Vein: No evidence of thrombus. Normal compressibility, respiratory phasicity and response to augmentation. Saphenofemoral Junction: No evidence of thrombus. Normal compressibility and flow on color Doppler imaging. Profunda Femoral Vein: No evidence of thrombus. Normal compressibility and flow on color Doppler imaging. Femoral Vein: No evidence of thrombus. Normal compressibility, respiratory phasicity and response to augmentation. Popliteal Vein: No evidence of thrombus. Normal compressibility, respiratory phasicity and response to augmentation. Calf Veins: No evidence of thrombus. Normal compressibility and flow on color Doppler imaging. Superficial Great Saphenous Vein: No evidence of thrombus. Normal compressibility. Venous Reflux:  None. Other Findings: No evidence of superficial thrombophlebitis or abnormal fluid collection. LEFT  LOWER EXTREMITY Common Femoral Vein: No evidence of thrombus. Normal compressibility, respiratory phasicity and response to augmentation. Saphenofemoral Junction: No evidence of thrombus. Normal compressibility and flow on color Doppler imaging. Profunda Femoral Vein: No evidence of thrombus. Normal compressibility and flow on color Doppler imaging. Femoral Vein: No evidence of thrombus. Normal compressibility, respiratory phasicity and response to augmentation. Popliteal Vein: No evidence of thrombus. Normal compressibility, respiratory phasicity and response to augmentation. Calf  Veins: Thrombus in the visualized left peroneal vein. The posterior tibial vein appears normally patent. Superficial Great Saphenous Vein: No evidence of thrombus. Normal compressibility. Venous Reflux:  None. Other Findings: No evidence of superficial thrombophlebitis or abnormal fluid collection. IMPRESSION: 1. Deep vein thrombosis in the left peroneal vein. 2. No evidence of deep vein thrombosis in the right lower extremity. Electronically Signed   By: Marcey Moan M.D.   On: 05/06/2024 14:11   DG Chest 2 View Result Date: 05/06/2024 EXAM: 2 VIEW(S) XRAY OF THE CHEST 05/06/2024 11:55:00 AM COMPARISON: 04/15/2024. Chest CT of 04/16/2024. CLINICAL HISTORY: SHOB. Table formatting from the original note was not included.; Images from the original note were not included.; Reports bilateral leg swelling and redness. Hx of cellulitis. Recently in ICU for similar swelling. FINDINGS: LUNGS AND PLEURA: Hyperinflation. Right upper lobe pulmonary nodule was detailed on prior chest CT. Left base scarring and/or subsegmental atelectasis. Tiny bilateral pleural effusions. No pulmonary edema. No pneumothorax. HEART AND MEDIASTINUM: Mild cardiomegaly. Transverse aortic atherosclerosis. BONES AND SOFT TISSUES: No acute osseous abnormality. IMPRESSION: 1. Cardiomegaly with small bilateral pleural effusions and left base atelectasis. 2. . Right  upper lobe pulmonary nodule, as on prior CT. Electronically signed by: Rockey Kilts MD 05/06/2024 01:32 PM EDT RP Workstation: [id number]     Procedures   Medications Ordered in the ED  acetaminophen  (TYLENOL ) tablet 650 mg (650 mg Oral Given 05/06/24 1235)  furosemide  (LASIX ) injection 40 mg (40 mg Intravenous Given 05/06/24 1425)    Clinical Course as of 05/06/24 1615  Tue May 06, 2024  1402 Pro Brain Natriuretic Peptide(!): 360.0 [CA]  1402 Troponin T High Sensitivity(!): 37 [CA]  1402 Hemoglobin(!): 10.3 [CA]  1402 WBC(!): 10.9 [CA]    Clinical Course User Index [CA] Lorelle Aleck [name], PA-C                                 Medical Decision Making Amount and/or Complexity of Data Reviewed Independent Historian: spouse    Details: Wife and daughter at bedside provided history External Data Reviewed: notes.    Details: Recent admission notes Labs: ordered. Decision-making details documented in ED Course. Radiology: ordered and independent interpretation performed. Decision-making details documented in ED Course. ECG/medicine tests: ordered and independent interpretation performed. Decision-making details documented in ED Course.  Risk OTC drugs. Prescription drug management. Decision regarding hospitalization.   This patient presents to the ED for concern of lower extremity edema, this involves an extensive number of treatment options, and is a complaint that carries with it a high risk of complications and morbidity.  The differential diagnosis includes CHF, DVT, infection, bony fracture, etc  89 year old male presents to the ED due to bilateral lower extremity edema.  Recently admitted for GI bleed secondary to gastric ulcers complicated by hemorrhagic shock.  Also recently admitted for a right hip fracture with repair on 9/22.  Notes he was edematous during his most recent admission which he thought was related to the IV fluids.  Edema has worsened to bilateral feet and  right lower extremity.  History of CHF.  Denies chest pain and shortness of breath.  Upon arrival, vitals all within normal limits.  Patient in no acute distress.  Does have 2+ pitting edema to bilateral feet and right lower extremity.  Purpleish discoloration to right lower extremity.  See photo above.  Routine labs ordered.  BNP to rule out CHF.  Ultrasound to rule  out DVT given recent surgery. Pedal pulses appreciated with bedside doppler. Low suspicion for arterial occlusion.   CBC with slight leukocytosis at 10.9 and hemoglobin at 10.3 which appears to be better than patient's recent admission.  BMP with normal creatinine.  No major electrolyte derangements.  BNP elevated.  Given patient is fluid overloaded on exam will give dose of IV Lasix .  Troponin elevated x2, but flat. EKG NSR, RBBB. Low suspicion for ACS. US  + left DVT. CXR personally reviewed and interpreted which demonstrates cardiomegaly with bilateral pleural effusions.  Also demonstrates right upper lobe pulmonary nodule.  Given acute DVT and worsening edema fell patient would benefit from admission for IV dieresis. Will discuss with hospitalist about anticoagulant treatment. Discussed with Dr. Ula who agrees with assessment and plan.    Co morbidities that complicate the patient evaluation  CHF Cardiac Monitoring: / EKG:  The patient was maintained on a cardiac monitor.  I personally viewed and interpreted the cardiac monitored which showed an underlying rhythm of: NSR, RBBB  Social Determinants of Health:  Elderly, recent admission  Test / Admission - Considered:  Patient will require admission for acute DVT and CHF exacerbation  4:11 PM Discussed with Dr. Darci with [facility] who agrees to admit patient. He recommends starting patient on IV heparin for acute DVT and to monitor for signs of GI bleed.      Final diagnoses:  Acute deep vein thrombosis (DVT) of left peroneal vein (HCC)  Acute on chronic congestive heart failure,  unspecified heart failure type [facility])    ED Discharge Orders     None          Lorelle Aleck JAYSON DEVONNA 05/06/24 1615    Ula Prentice SAUNDERS, MD 05/07/24 [phone]

## 2024-05-06 NOTE — ED Triage Notes (Signed)
 Reports bilateral leg swelling and redness. Hx of cellulitis. Recently in ICU for similar swelling.

## 2024-05-06 NOTE — ED Notes (Signed)
 Called [facility] to transport the patient to [facility] 3[facility] rm# [id number]

## 2024-05-07 ENCOUNTER — Other Ambulatory Visit (HOSPITAL_COMMUNITY): Payer: Self-pay

## 2024-05-07 ENCOUNTER — Encounter (HOSPITAL_COMMUNITY): Payer: Self-pay | Admitting: Internal Medicine

## 2024-05-07 ENCOUNTER — Inpatient Hospital Stay (HOSPITAL_COMMUNITY)

## 2024-05-07 ENCOUNTER — Telehealth (HOSPITAL_COMMUNITY): Payer: Self-pay

## 2024-05-07 DIAGNOSIS — I5033 Acute on chronic diastolic (congestive) heart failure: Secondary | ICD-10-CM | POA: Diagnosis not present

## 2024-05-07 DIAGNOSIS — K259 Gastric ulcer, unspecified as acute or chronic, without hemorrhage or perforation: Secondary | ICD-10-CM

## 2024-05-07 DIAGNOSIS — L8990 Pressure ulcer of unspecified site, unspecified stage: Secondary | ICD-10-CM | POA: Insufficient documentation

## 2024-05-07 DIAGNOSIS — Z8781 Personal history of (healed) traumatic fracture: Secondary | ICD-10-CM

## 2024-05-07 DIAGNOSIS — I82402 Acute embolism and thrombosis of unspecified deep veins of left lower extremity: Secondary | ICD-10-CM

## 2024-05-07 DIAGNOSIS — I10 Essential (primary) hypertension: Secondary | ICD-10-CM

## 2024-05-07 LAB — ECHOCARDIOGRAM COMPLETE
AR max vel: 3.64 cm2
AV Area VTI: 3.73 cm2
AV Area mean vel: 3.46 cm2
AV Mean grad: 5 mmHg
AV Peak grad: 8.8 mmHg
Ao pk vel: 1.48 m/s
Area-P 1/2: 4.15 cm2
Height: 72 in
MV M vel: 3.65 m/s
MV Peak grad: 53.3 mmHg
P 1/2 time: 298 ms
S' Lateral: 4 cm
Weight: 3178.15 [oz_av]

## 2024-05-07 LAB — CBC
HCT: 29.5 % — ABNORMAL LOW (ref 39.0–52.0)
Hemoglobin: 9 g/dL — ABNORMAL LOW (ref 13.0–17.0)
MCH: 32.4 pg (ref 26.0–34.0)
MCHC: 30.5 g/dL (ref 30.0–36.0)
MCV: 106.1 fL — ABNORMAL HIGH (ref 80.0–100.0)
Platelets: 163 K/uL (ref 150–400)
RBC: 2.78 MIL/uL — ABNORMAL LOW (ref 4.22–5.81)
RDW: 22 % — ABNORMAL HIGH (ref 11.5–15.5)
WBC: 8.1 K/uL (ref 4.0–10.5)
nRBC: 0 % (ref 0.0–0.2)

## 2024-05-07 LAB — BASIC METABOLIC PANEL WITH GFR
Anion gap: 10 (ref 5–15)
BUN: 23 mg/dL (ref 8–23)
CO2: 23 mmol/L (ref 22–32)
Calcium: 8.2 mg/dL — ABNORMAL LOW (ref 8.9–10.3)
Chloride: 106 mmol/L (ref 98–111)
Creatinine, Ser: 1.01 mg/dL (ref 0.61–1.24)
GFR, Estimated: >60 mL/min (ref >60)
Glucose, Bld: 99 mg/dL (ref 70–99)
Potassium: 3.9 mmol/L (ref 3.5–5.1)
Sodium: 139 mmol/L (ref 135–145)

## 2024-05-07 LAB — HEPARIN LEVEL (UNFRACTIONATED)
Heparin Unfractionated: 0.47 [IU]/mL (ref 0.30–0.70)
Heparin Unfractionated: 0.51 [IU]/mL (ref 0.30–0.70)

## 2024-05-07 LAB — MAGNESIUM: Magnesium: 2 mg/dL (ref 1.7–2.4)

## 2024-05-07 MED ORDER — SPIRONOLACTONE 25 MG PO TABS
25.0000 mg | ORAL_TABLET | Freq: Every day | ORAL | Status: DC
  Administered 2024-05-07 – 2024-05-11 (×5): 25 mg via ORAL
  Filled 2024-05-07 (×5): qty 1

## 2024-05-07 MED ORDER — FUROSEMIDE 10 MG/ML IJ SOLN
40.0000 mg | Freq: Two times a day (BID) | INTRAMUSCULAR | Status: DC
  Administered 2024-05-07: 40 mg via INTRAVENOUS
  Filled 2024-05-07: qty 4

## 2024-05-07 MED ORDER — FUROSEMIDE 10 MG/ML IJ SOLN
60.0000 mg | Freq: Three times a day (TID) | INTRAMUSCULAR | Status: DC
  Administered 2024-05-07 – 2024-05-08 (×3): 60 mg via INTRAVENOUS
  Filled 2024-05-07 (×3): qty 6

## 2024-05-07 NOTE — Progress Notes (Signed)
 Echocardiogram 2D Echocardiogram has been performed.  Jeffrey Stewart 05/07/2024, 4:06 PM

## 2024-05-07 NOTE — Progress Notes (Signed)
 Central tele called to report patient had 16 beat run of VT. Patient asymptomatic. Vitals stable

## 2024-05-07 NOTE — Telephone Encounter (Signed)
 Pharmacy Patient Advocate Encounter  Insurance verification completed.    The patient is insured through [employer]. Patient has Medicare and is not eligible for a copay card, but may be able to apply for patient assistance or Medicare RX Payment Plan (Patient Must reach out to their plan, if eligible for payment plan), if available.    Ran test claim for Jardiance 10mg  and the current 30 day co-pay is $47.00.  Ran test claim for Farxiga 10mg  and the current 30 day co-pay is $253.82   Ran test claim for Eliquis 5mg  and the current 30 day co-pay is $47.00.  Ran test claim for Xarelto 20mg  and the current 30 day co-pay is $47.00.  This test claim was processed through [facility]- copay amounts may vary at other pharmacies due to pharmacy/plan contracts, or as the patient moves through the different stages of their insurance plan.

## 2024-05-07 NOTE — Progress Notes (Signed)
 Orthopedic Tech Progress Note Patient Details:  Jeffrey Stewart 1934/09/12 [id number]  Ortho Devices Type of Ortho Device: Radio broadcast assistant Ortho Device/Splint Location: Bilateral Ortho Device/Splint Interventions: Ordered, Application, Adjustment   Post Interventions Patient Tolerated: Well  Thersia FALCON [name] 05/07/2024, 10:36 AM

## 2024-05-07 NOTE — Progress Notes (Signed)
 PHARMACY - ANTICOAGULATION CONSULT NOTE  Pharmacy Consult for Heparin Indication: DVT  Allergies  Allergen Reactions   Niaspan  [Niacin ] Other (See Comments)    Intolerance flush feeling when on large dose - is able to take smaller dosage without problem    Meloxicam Other (See Comments)    Disorientation     Patient Measurements: Height: 6' (182.9 cm) Weight: 90.1 kg (198 lb 10.2 oz) IBW/kg (Calculated) : 77.6 HEPARIN DW (KG): 90.1  Vital Signs: Temp: 98.2 F (36.8 C) (10/22 0330) Temp Source: Oral (10/22 0330) BP: 116/58 (10/22 0330) Pulse Rate: 97 (10/22 0330)  Labs: Recent Labs    05/06/24 1224 05/07/24 0237  HGB 10.3* 9.0*  HCT 33.1* 29.5*  PLT 148* 163  HEPARINUNFRC  --  0.47  CREATININE 1.00 1.01    Estimated Creatinine Clearance: 56.6 mL/min (by C-G formula based on SCr of 1.01 mg/dL).   Medical History: Past Medical History:  Diagnosis Date   Allergy    Arthritis    MINOR ARTHRITIS FEET AND TOES   Asthma    Cataract    CHF (congestive heart failure) (HCC) 11/14/2009   EF45-50%   Coronary artery disease    Essential hypertension 05/07/2024   Glaucoma (increased eye pressure) 04/06/2024   Hyperlipidemia    Myocardial infarction (HCC)    Pneumonia    HX OF PNEUMONIA 4 OR 5 YRS AGO   Rosacea    OF FACE   Shortness of breath    SOMETIMES SOB OR WHEEZING WITH EXERTION   Symptomatic anemia 04/18/2024    Assessment: 89 YOM presenting with leg swelling, positive ultrasound for DVT, he is not on anticoagulation PTA, chronic anemia stable however recent GI bleed hx  10/22 AM update:  Heparin level therapeutic   Goal of Therapy:  Heparin level 0.3-0.5 units/mL-recent hx of GIB Monitor platelets by anticoagulation protocol: Yes   Plan:  Cont heparin 1500 units/hr Heparin level in 8 hours   Lynwood Mckusick, PharmD, BCPS Clinical Pharmacist Phone: [phone]

## 2024-05-07 NOTE — Progress Notes (Signed)
 PROGRESS NOTE    Jeffrey Stewart  FMW:[id number] DOB: [birth year] DOA: 05/06/2024 PCP: Anita Bernardino [name], FNP  89/M w interstitial lung disease, diastolic CHF, CAD,  rheumatoid arthritis, RBBB, right hip fracture s/p ORIF 03/2024, followed by recent hospitalization for GI bleed and hemorrhagic shock who presented [facility] for evaluation of bilateral lower extremity swelling.  - Recent long hospitalization at [facility] from 9/29-10/6 for hemorrhagic shock, acute blood loss anemia, required 5 units PRBC, pressors, EGD noted multiple nonbleeding gastric ulcers , eventually stabilized and discharged home . - Patient noticed lower extremity swelling during the hospitalization which progressively worsened then developed some purplish discoloration and ecchymosis in his lower legs, presented to the ED 10/21. -In the ER vital signs stable, BNP 360, troponin 37, WBC 10.9, hemoglobin 10.3, chest x-ray with cardiomegaly and small bilateral pleural effusions, lower extremity duplex ultrasound noted DVT in left peroneal vein   Subjective: -Feels fair, reports significant swelling, denies considerable dyspnea this morning  Assessment and plan   Acute LLE DVT - Patient with a recent ORIF with intramedullary nailing rod on 9/22 followed by quick rehospitalization from 9/29 - 10/6  - LE U/S shows a DVT in the left peroneal vein - Continue IV heparin today, switch to DOAC in 1 to 2 days if no bleeding issues, recent hemorrhagic shock and upper GI bleed   Acute on chronic diastolic HF - Last TTE on 08/2021 shows EF 50%, LV global hypokinesis, severe LVH, and G1DD - Likely worsened by her recent hospitalization for hemorrhagic shock, diffuse 5 units PRBC  - Continue IV Lasix  increased dose to 60 mg 3 times daily, add Aldactone - Follow-up repeat echo  - Add Unna boots   Normocytic anemia Recent upper GI bleed and hemorrhagic shock, gastric ulcers - Hgb stable at 10.3, improved from 8.0 at recent hospital  discharge -Continue Protonix 40 mg twice daily - Continue iron supplementation   # HTN - BP stable with SBP in the 110-120s   # Rheumatoid arthritis - Continue CellCept  and prednisone    # HLD - Continue atorvastatin  and omega-3 fatty acid   # Hx of right hip fracture s/p ORIF # History of arthritis - Follow-up with orthopedic surgery in the outpatient - PT/OT eval and treat   DVT prophylaxis: Heparin  Code Status: Discussed CODE STATUS with the patient, now wishes for DNR Family Communication: Wife and daughter at bedside Disposition Plan: Home likely 2 to 3 days  Consultants:    Procedures:   Antimicrobials:    Objective: Vitals:   05/07/24 0210 05/07/24 0330 05/07/24 0726 05/07/24 0940  BP: (!) 125/51 (!) 116/58 131/69 113/61  Pulse: 94 97 (!) 110 95  Resp: (!) 27 (!) 25 (!) 24 17  Temp:  98.2 F (36.8 C) 98 F (36.7 C) 98.3 F (36.8 C)  TempSrc:  Oral Oral Oral  SpO2: 97% 99% 99% 98%  Weight:  90.1 kg    Height:        Intake/Output Summary (Last 24 hours) at 05/07/2024 1101 Last data filed at 05/07/2024 1100 Gross per 24 hour  Intake 1041.5 ml  Output 3205 ml  Net -2163.5 ml   Filed Weights   05/06/24 1930 05/07/24 0330  Weight: 90.1 kg 90.1 kg    Examination:  General exam: Appears calm and comfortable, chronically ill-appearing Respiratory system: Creased breath sounds at the bases Cardiovascular system: S1 & S2 heard, RRR.  Abd: nondistended, soft and nontender.Normal bowel sounds heard. Central nervous system: Alert  and oriented. No focal neurological deficits. Extremities: 3+ edema with discoloration, hyperpigmentation, ecchymosis Skin: No rashes Psychiatry:  Mood & affect appropriate.     Data Reviewed:   CBC: Recent Labs  Lab 05/06/24 1224 05/07/24 0237  WBC 10.9* 8.1  HGB 10.3* 9.0*  HCT 33.1* 29.5*  MCV 105.8* 106.1*  PLT 148* 163   Basic Metabolic Panel: Recent Labs  Lab 05/06/24 1224 05/07/24 0237  NA 141 139  K  4.0 3.9  CL 103 106  CO2 26 23  GLUCOSE 125* 99  BUN 28* 23  CREATININE 1.00 1.01  CALCIUM  9.4 8.2*  MG  --  2.0   GFR: Estimated Creatinine Clearance: 56.6 mL/min (by C-G formula based on SCr of 1.01 mg/dL). Liver Function Tests: No results for input(s): AST, ALT, ALKPHOS, BILITOT, PROT, ALBUMIN  in the last 168 hours. No results for input(s): LIPASE, AMYLASE in the last 168 hours. No results for input(s): AMMONIA in the last 168 hours. Coagulation Profile: No results for input(s): INR, PROTIME in the last 168 hours. Cardiac Enzymes: No results for input(s): CKTOTAL, CKMB, CKMBINDEX, TROPONINI in the last 168 hours. BNP (last 3 results) Recent Labs    05/06/24 1224  PROBNP 360.0*   HbA1C: No results for input(s): HGBA1C in the last 72 hours. CBG: No results for input(s): GLUCAP in the last 168 hours. Lipid Profile: No results for input(s): CHOL, HDL, LDLCALC, TRIG, CHOLHDL, LDLDIRECT in the last 72 hours. Thyroid Function Tests: No results for input(s): TSH, T4TOTAL, FREET4, T3FREE, THYROIDAB in the last 72 hours. Anemia Panel: No results for input(s): VITAMINB12, FOLATE, FERRITIN, TIBC, IRON, RETICCTPCT in the last 72 hours. Urine analysis:    Component Value Date/Time   COLORURINE YELLOW 04/14/2024 2215   APPEARANCEUR HAZY (A) 04/14/2024 2215   LABSPEC 1.020 04/14/2024 2215   PHURINE 5.0 04/14/2024 2215   GLUCOSEU NEGATIVE 04/14/2024 2215   HGBUR NEGATIVE 04/14/2024 2215   BILIRUBINUR NEGATIVE 04/14/2024 2215   KETONESUR NEGATIVE 04/14/2024 2215   PROTEINUR NEGATIVE 04/14/2024 2215   UROBILINOGEN 1.0 09/24/2013 0905   NITRITE NEGATIVE 04/14/2024 2215   LEUKOCYTESUR NEGATIVE 04/14/2024 2215   Sepsis Labs: @LABRCNTIP (procalcitonin:4,lacticidven:4)  )No results found for this or any previous visit (from the past 240 hours).   Radiology Studies: US  Venous Img Lower Bilateral Result Date:  05/06/2024 CLINICAL DATA:  Bilateral lower extremity edema. EXAM: BILATERAL LOWER EXTREMITY VENOUS DOPPLER ULTRASOUND TECHNIQUE: Gray-scale sonography with graded compression, as well as color Doppler and duplex ultrasound were performed to evaluate the lower extremity deep venous systems from the level of the common femoral vein and including the common femoral, femoral, profunda femoral, popliteal and calf veins including the posterior tibial, peroneal and gastrocnemius veins when visible. The superficial great saphenous vein was also interrogated. Spectral Doppler was utilized to evaluate flow at rest and with distal augmentation maneuvers in the common femoral, femoral and popliteal veins. COMPARISON:  None Available. FINDINGS: RIGHT LOWER EXTREMITY Common Femoral Vein: No evidence of thrombus. Normal compressibility, respiratory phasicity and response to augmentation. Saphenofemoral Junction: No evidence of thrombus. Normal compressibility and flow on color Doppler imaging. Profunda Femoral Vein: No evidence of thrombus. Normal compressibility and flow on color Doppler imaging. Femoral Vein: No evidence of thrombus. Normal compressibility, respiratory phasicity and response to augmentation. Popliteal Vein: No evidence of thrombus. Normal compressibility, respiratory phasicity and response to augmentation. Calf Veins: No evidence of thrombus. Normal compressibility and flow on color Doppler imaging. Superficial Great Saphenous Vein: No evidence of thrombus. Normal compressibility.  Venous Reflux:  None. Other Findings: No evidence of superficial thrombophlebitis or abnormal fluid collection. LEFT LOWER EXTREMITY Common Femoral Vein: No evidence of thrombus. Normal compressibility, respiratory phasicity and response to augmentation. Saphenofemoral Junction: No evidence of thrombus. Normal compressibility and flow on color Doppler imaging. Profunda Femoral Vein: No evidence of thrombus. Normal compressibility and  flow on color Doppler imaging. Femoral Vein: No evidence of thrombus. Normal compressibility, respiratory phasicity and response to augmentation. Popliteal Vein: No evidence of thrombus. Normal compressibility, respiratory phasicity and response to augmentation. Calf Veins: Thrombus in the visualized left peroneal vein. The posterior tibial vein appears normally patent. Superficial Great Saphenous Vein: No evidence of thrombus. Normal compressibility. Venous Reflux:  None. Other Findings: No evidence of superficial thrombophlebitis or abnormal fluid collection. IMPRESSION: 1. Deep vein thrombosis in the left peroneal vein. 2. No evidence of deep vein thrombosis in the right lower extremity. Electronically Signed   By: Marcey Moan M.D.   On: 05/06/2024 14:11   DG Chest 2 View Result Date: 05/06/2024 EXAM: 2 VIEW(S) XRAY OF THE CHEST 05/06/2024 11:55:00 AM COMPARISON: 04/15/2024. Chest CT of 04/16/2024. CLINICAL HISTORY: SHOB. Table formatting from the original note was not included.; Images from the original note were not included.; Reports bilateral leg swelling and redness. Hx of cellulitis. Recently in ICU for similar swelling. FINDINGS: LUNGS AND PLEURA: Hyperinflation. Right upper lobe pulmonary nodule was detailed on prior chest CT. Left base scarring and/or subsegmental atelectasis. Tiny bilateral pleural effusions. No pulmonary edema. No pneumothorax. HEART AND MEDIASTINUM: Mild cardiomegaly. Transverse aortic atherosclerosis. BONES AND SOFT TISSUES: No acute osseous abnormality. IMPRESSION: 1. Cardiomegaly with small bilateral pleural effusions and left base atelectasis. 2. . Right upper lobe pulmonary nodule, as on prior CT. Electronically signed by: Rockey Kilts MD 05/06/2024 01:32 PM EDT RP Workstation: [id number]     Scheduled Meds:  atorvastatin   40 mg Oral QHS   brimonidine   1 drop Both Eyes BID   cholecalciferol   1,000 Units Oral Daily   ferrous sulfate  325 mg Oral Q breakfast    fluticasone  furoate-vilanterol  1 puff Inhalation Daily   furosemide   60 mg Intravenous TID   latanoprost   1 drop Both Eyes QHS   loratadine   10 mg Oral Daily   mycophenolate   1,000 mg Oral BID   omega-3 acid ethyl esters  1 g Oral BID   pantoprazole  40 mg Oral BID   potassium chloride  SA  20 mEq Oral Daily   predniSONE   1 mg Oral QHS   predniSONE   5 mg Oral Q breakfast   sucralfate  1 g Oral TID WC & HS   Continuous Infusions:  heparin 1,500 Units/hr (05/07/24 0626)     LOS: 1 day    Time spent:    Sigurd Pac, MD [facility]   05/07/2024, 11:01 AM

## 2024-05-07 NOTE — Progress Notes (Signed)
 PHARMACY - ANTICOAGULATION CONSULT NOTE  Pharmacy Consult for Heparin Indication: DVT  Allergies  Allergen Reactions   Niaspan  [Niacin ] Other (See Comments)    Intolerance flush feeling when on large dose - is able to take smaller dosage without problem    Meloxicam Other (See Comments)    Disorientation     Patient Measurements: Height: 6' (182.9 cm) Weight: 90.1 kg (198 lb 10.2 oz) IBW/kg (Calculated) : 77.6 HEPARIN DW (KG): 90.1  Vital Signs: Temp: 98.7 F (37.1 C) (10/22 1119) Temp Source: Oral (10/22 1119) BP: 118/54 (10/22 1119) Pulse Rate: 90 (10/22 1119)  Labs: Recent Labs    05/06/24 1224 05/07/24 0237 05/07/24 1222  HGB 10.3* 9.0*  --   HCT 33.1* 29.5*  --   PLT 148* 163  --   HEPARINUNFRC  --  0.47 0.51  CREATININE 1.00 1.01  --     Estimated Creatinine Clearance: 56.6 mL/min (by C-G formula based on SCr of 1.01 mg/dL).   Medical History: Past Medical History:  Diagnosis Date   Allergy    Arthritis    MINOR ARTHRITIS FEET AND TOES   Asthma    Cataract    CHF (congestive heart failure) (HCC) 11/14/2009   EF45-50%   Coronary artery disease    Essential hypertension 05/07/2024   Glaucoma (increased eye pressure) 04/06/2024   Hyperlipidemia    Myocardial infarction (HCC)    Pneumonia    HX OF PNEUMONIA 4 OR 5 YRS AGO   Rosacea    OF FACE   Shortness of breath    SOMETIMES SOB OR WHEEZING WITH EXERTION   Symptomatic anemia 04/18/2024    Assessment: 89 YOM presenting with leg swelling, positive ultrasound for DVT, he is not on anticoagulation PTA, chronic anemia stable however recent GI bleed hx  -heparin level= 0.51 and just slightly above goal -hg= 9.0  Goal of Therapy:  Heparin level 0.3-0.5 units/mL-recent hx of GIB Monitor platelets by anticoagulation protocol: Yes   Plan:  -Continue heparin 1500 units/hr; will adjust based on trend in the morning -Daily heparin level and CBC  Prentice Poisson, PharmD Clinical  Pharmacist **Pharmacist phone directory can now be found on [email or web address] ([email or web address]).  Listed under [facility].

## 2024-05-07 NOTE — Progress Notes (Signed)
 OT Cancellation Note  Patient Details Name: Jeffrey Stewart MRN: [id number] DOB: 1934-08-12   Cancelled Treatment:    Reason Eval/Treat Not Completed: (P) Medical issues which prohibited therapy, heparin 10/21 16:59, will return when able to perform OOB activities  Elouise JONELLE Bott 05/07/2024, 10:32 AM

## 2024-05-07 NOTE — Progress Notes (Signed)
 PT Cancellation Note  Patient Details Name: Jeffrey Stewart MRN: [id number] DOB: [birth year]   Cancelled Treatment:    Reason Eval/Treat Not Completed: Medical issues which prohibited therapy;Other (comment) Pt with acute DVT and heparin initiated 10/21 at 16:59, per dept protocol and discussion with MD will hold on PT evaluation for 24 hours and plan to evaluate tomorrow 10/23.   Izetta Call, PT, DPT   Acute Rehabilitation Department Office [phone] Secure Chat Communication Preferred   Izetta JULIANNA Call 05/07/2024, 10:30 AM

## 2024-05-08 DIAGNOSIS — I82402 Acute embolism and thrombosis of unspecified deep veins of left lower extremity: Secondary | ICD-10-CM | POA: Diagnosis not present

## 2024-05-08 LAB — BASIC METABOLIC PANEL WITH GFR
Anion gap: 10 (ref 5–15)
BUN: 22 mg/dL (ref 8–23)
CO2: 28 mmol/L (ref 22–32)
Calcium: 8.3 mg/dL — ABNORMAL LOW (ref 8.9–10.3)
Chloride: 99 mmol/L (ref 98–111)
Creatinine, Ser: 1.25 mg/dL — ABNORMAL HIGH (ref 0.61–1.24)
GFR, Estimated: 56 mL/min — ABNORMAL LOW (ref >60)
Glucose, Bld: 126 mg/dL — ABNORMAL HIGH (ref 70–99)
Potassium: 3.7 mmol/L (ref 3.5–5.1)
Sodium: 137 mmol/L (ref 135–145)

## 2024-05-08 LAB — CBC
HCT: 30.3 % — ABNORMAL LOW (ref 39.0–52.0)
Hemoglobin: 9.4 g/dL — ABNORMAL LOW (ref 13.0–17.0)
MCH: 31.8 pg (ref 26.0–34.0)
MCHC: 31 g/dL (ref 30.0–36.0)
MCV: 102.4 fL — ABNORMAL HIGH (ref 80.0–100.0)
Platelets: 177 K/uL (ref 150–400)
RBC: 2.96 MIL/uL — ABNORMAL LOW (ref 4.22–5.81)
RDW: 22 % — ABNORMAL HIGH (ref 11.5–15.5)
WBC: 8.7 K/uL (ref 4.0–10.5)
nRBC: 0 % (ref 0.0–0.2)

## 2024-05-08 LAB — HEPARIN LEVEL (UNFRACTIONATED): Heparin Unfractionated: 0.54 [IU]/mL (ref 0.30–0.70)

## 2024-05-08 MED ORDER — POTASSIUM CHLORIDE CRYS ER 20 MEQ PO TBCR
40.0000 meq | EXTENDED_RELEASE_TABLET | Freq: Every day | ORAL | Status: DC
  Administered 2024-05-09 – 2024-05-11 (×3): 40 meq via ORAL
  Filled 2024-05-08 (×3): qty 2

## 2024-05-08 MED ORDER — FUROSEMIDE 10 MG/ML IJ SOLN
60.0000 mg | Freq: Two times a day (BID) | INTRAMUSCULAR | Status: DC
Start: 2024-05-08 — End: 2024-05-09
  Administered 2024-05-08 – 2024-05-09 (×2): 60 mg via INTRAVENOUS
  Filled 2024-05-08 (×2): qty 6

## 2024-05-08 MED ORDER — GERHARDT'S BUTT CREAM
TOPICAL_CREAM | Freq: Two times a day (BID) | CUTANEOUS | Status: DC
  Filled 2024-05-08: qty 60

## 2024-05-08 MED ORDER — APIXABAN 5 MG PO TABS: 5.0000 mg | ORAL_TABLET | Freq: Two times a day (BID) | ORAL | Status: DC

## 2024-05-08 MED ORDER — APIXABAN 5 MG PO TABS
10.0000 mg | ORAL_TABLET | Freq: Two times a day (BID) | ORAL | Status: DC
  Administered 2024-05-08 – 2024-05-11 (×7): 10 mg via ORAL
  Filled 2024-05-08 (×7): qty 2

## 2024-05-08 NOTE — Progress Notes (Signed)
   Heart Failure Stewardship Pharmacist Progress Note   PCP: Anita Bernardino [name], FNP PCP-Cardiologist: Oneil Parchment, MD    HPI:  89 yo M with PMH of CHF, HTN, HLD, ILD, RA, and CAD.   S/p ORIF with intramedullary nailing rod on 9/22.  Recenly admitted from 9/29-10/6 due to GI bleeding secondary to gastric ulcers complicated by hemorrhagic shock.   Presented to the ED on 10/21 with worsening bilateral LE edema (started during previous hospitalization) and discoloration (new). proBNP 360. EKG showed sinus rhythm with RBBB. CXR with cardiomegaly and small bilateral pleural effusions. Received IV lasix  in the ED. Found to have acute left leg DVT and started on anticoagulation. ECHO on 10/22 with LVEF 45-50%, global hypokinesis, moderate asymmetric LVH, G1DD, RV mildly reduced.   Denies shortness of breath. States that he cannot lay flat normally due to ILD. Feels his legs are less swollen today. UNNA boots in place.   Current HF Medications: Diuretic: furosemide  60 mg IV BID MRA: spironolactone 25 mg daily  Prior to admission HF Medications: Diuretic: torsemide  20 mg daily  Pertinent Lab Values: Serum creatinine 1.01>1.25, BUN 22, Potassium 3.7, Sodium 137, proBNP 360, Magnesium 2.0  Vital Signs: Weight: 199 lbs (admission weight: 198 lbs) Blood pressure: 110-120/60s  Heart rate: 90-110s  I/O: net -2.6L yesterday; net -5.3L since admission  Medication Assistance / Insurance Benefits Check: Does the patient have prescription insurance?  Yes Type of insurance plan: [employer] Medicare  Does the patient qualify for medication assistance through manufacturers or grants?   Pending - is ok with temporary Eliquis copay if he does not need to be on this lifelong  Outpatient Pharmacy:  [facility] the patient willing to use [facility] TOC pharmacy at discharge? Yes Is the patient willing to transition their outpatient pharmacy to utilize a [facility]?   No    Assessment: 1. Acute on chronic diastolic CHF (LVEF 45-50%). NYHA class III symptoms. - Continue furosemide  60 mg IV BID - decreased today from 60 mg IV TID. Strict I/Os and daily weights. Keep K>4 and Mg>2.  - Continue spironolactone 25 mg daily - Consider adding Jardiance prior to discharge pending creatinine stabilization   Plan: 1) Medication changes recommended at this time: - Agree with changes as above  2) Patient assistance: - Jardiance copay $47 - Farxiga copay [phone]  3)  Education  - Initial education completed - Full education to be completed prior to discharge  Duwaine Plant, PharmD, BCPS Heart Failure Engineer, building services Phone [phone]

## 2024-05-08 NOTE — TOC Initial Note (Addendum)
 Transition of Care [facility]) - Initial/Assessment Note    Patient Details  Name: Jeffrey Stewart MRN: [id number] Date of Birth: 05-16-1935  [facility] CM/SW Contact:    Waddell Barnie Rama, RN Phone Number: 05/08/2024, 2:21 PM  Clinical Narrative:                 From home with spouse, has PCP and insurance on file, states has [facility] services in place with [facility] and would like to continue with them, this NCM confirmed this information and sent referral thru the portal for HHPT, HHOT, has  w/chair, walker, bsc, cane, grab bars at home.  States family member  (daughter) will transport them home at [employer] and family is support system, [facility] [place]  Pta self ambulatory.  He is on Eliquis, his copay is 47.00 for 30 day supply.  Expected Discharge Plan: Home w Home Health Services Barriers to Discharge: Continued Medical Work up   Patient Goals and CMS Choice Patient states their goals for this hospitalization and ongoing recovery are:: return home CMS Medicare.gov Compare Post Acute Care list provided to:: Patient Choice offered to / list presented to : Patient      Expected Discharge Plan and Services In-house Referral: NA Discharge Planning Services: CM Consult Post Acute Care Choice: Home Health Living arrangements for the past 2 months: Single Family Home                 DME Arranged: N/A DME Agency: NA       HH Arranged: PT, OT HH Agency: [facility] Date HH Agency Contacted: 05/08/24 Time HH Agency Contacted: 1420 Representative spoke with at [facility] Agency: [place]  Prior Living Arrangements/Services Living arrangements for the past 2 months: Single Family Home Lives with:: Spouse Patient language and need for interpreter reviewed:: Yes Do you feel safe going back to the place where you live?: Yes      Need for Family Participation in Patient Care: Yes (Comment) Care giver support system in place?: Yes  (comment) Current home services: DME (w/chair, walker, bsc, cane, grab bars) Criminal Activity/Legal Involvement Pertinent to Current Situation/Hospitalization: No - Comment as needed  Activities of Daily Living   ADL Screening (condition at time of admission) Independently performs ADLs?: No Does the patient have a NEW difficulty with bathing/dressing/toileting/self-feeding that is expected to last >3 days?: Yes (Initiates electronic notice to provider for possible OT consult) Does the patient have a NEW difficulty with getting in/out of bed, walking, or climbing stairs that is expected to last >3 days?: Yes (Initiates electronic notice to provider for possible PT consult) Does the patient have a NEW difficulty with communication that is expected to last >3 days?: Yes (Initiates electronic notice to provider for possible SLP consult) Is the patient deaf or have difficulty hearing?: No Does the patient have difficulty seeing, even when wearing glasses/contacts?: No Does the patient have difficulty concentrating, remembering, or making decisions?: No  Permission Sought/Granted Permission sought to share information with : Case Manager Permission granted to share information with : Yes, Verbal Permission Granted     Permission granted to share info w AGENCY: HH        Emotional Assessment Appearance:: Appears stated age Attitude/Demeanor/Rapport: Engaged Affect (typically observed): Appropriate Orientation: : Oriented to Self, Oriented to Place, Oriented to  Time, Oriented to Situation Alcohol  / Substance Use: Not Applicable Psych Involvement: No (comment)  Admission diagnosis:  Leg DVT (deep venous thromboembolism), acute,  left [facility]) [I82.402] Acute deep vein thrombosis (DVT) of left peroneal vein (HCC) [I82.452] Acute on chronic congestive heart failure, unspecified heart failure type [facility]) [I50.9] Patient Active Problem List   Diagnosis Date Noted   Acute on chronic diastolic CHF  (congestive heart failure) (HCC) 05/07/2024   Essential hypertension 05/07/2024   Pressure injury of skin 05/07/2024   Multiple gastric ulcers 05/07/2024   History of fracture of right hip 05/07/2024   Leg DVT (deep venous thromboembolism), acute, left (HCC) 05/06/2024   AKI (acute kidney injury) 04/19/2024   Normocytic anemia 04/18/2024   Chronic heart failure with preserved ejection fraction (HFpEF) (HCC) 04/16/2024   Hemorrhagic shock (HCC) 04/16/2024   Acute gastric ulcer with hemorrhage 04/15/2024   Upper GI bleed 04/14/2024   Decubital ulcer 04/14/2024   ABLA (acute blood loss anemia) 04/09/2024   Closed displaced intertrochanteric fracture of right femur (HCC) 04/07/2024   Malnutrition of moderate degree 04/07/2024   Hip fracture (HCC) 04/06/2024   Chronic combined systolic (congestive) and diastolic (congestive) heart failure (HCC) 04/06/2024   ILD (interstitial lung disease) (HCC) 04/06/2024   Glaucoma (increased eye pressure) 04/06/2024   Parotid mass 04/06/2024   Neck pain 04/06/2024   Pneumonia 09/07/2023   Rheumatoid arthritis (HCC) 09/07/2023   Acute renal failure superimposed on stage 3a chronic kidney disease (HCC) 09/07/2023   Thrombocytopenia 04/25/2021   Macrocytosis 01/21/2021   Bilateral inguinal hernia 09/19/2013   RBBB (right bundle branch block) 05/08/2011   CAD (coronary artery disease) 10/27/2010   CHF (congestive heart failure) (HCC) 10/27/2010   Hyperlipidemia 10/27/2010   PCP:  Anita Bernardino [name], FNP Pharmacy:   [phone] [place] [place] [facility] [address] [place] [address] Phone: [phone] Fax: [phone]   - [facility] [address] [place] [address] Phone: [phone] Fax: [phone]  [facility] - [address], [place] - [address] [place] [address] Phone: [phone] Fax:  [phone]  [facility] [address] [place] [address] Phone: [phone] Fax: [phone]     Social Drivers of Health (SDOH) Social History: SDOH Screenings   Food Insecurity: No Food Insecurity (05/06/2024)  Housing: Low Risk  (05/06/2024)  Transportation Needs: No Transportation Needs (05/06/2024)  Utilities: Not At Risk (05/06/2024)  Social Connections: Moderately Integrated (05/06/2024)  Tobacco Use: Low Risk  (05/06/2024)   SDOH Interventions:     Readmission Risk Interventions    05/08/2024    2:16 PM 04/21/2024   11:03 AM 04/15/2024   11:41 AM  Readmission Risk Prevention Plan  Transportation Screening Complete Complete Complete  PCP or Specialist Appt within 3-5 Days Complete    HRI or Home Care Consult Complete Complete Complete  Social Work Consult for Recovery Care Planning/Counseling  Complete Complete  Palliative Care Screening Not Applicable Not Applicable Not Applicable  Medication Review Oceanographer) Complete Complete Complete

## 2024-05-08 NOTE — Plan of Care (Signed)

## 2024-05-08 NOTE — Discharge Instructions (Addendum)
 Information on my medicine - ELIQUIS (apixaban)  This medication education was reviewed with me or my healthcare representative as part of my discharge preparation.    Why was Eliquis prescribed for you? Eliquis was prescribed to treat blood clots that may have been found in the veins of your legs (deep vein thrombosis) or in your lungs (pulmonary embolism) and to reduce the risk of them occurring again.  What do You need to know about Eliquis ? The starting dose is 10 mg (two 5 mg tablets) taken TWICE daily for the FIRST SEVEN (7) DAYS, then on  10/30  the dose is reduced to ONE 5 mg tablet taken TWICE daily.  Eliquis may be taken with or without food.   Try to take the dose about the same time in the morning and in the evening. If you have difficulty swallowing the tablet whole please discuss with your pharmacist how to take the medication safely.  Take Eliquis exactly as prescribed and DO NOT stop taking Eliquis without talking to the doctor who prescribed the medication.  Stopping may increase your risk of developing a new blood clot.  Refill your prescription before you run out.  After discharge, you should have regular check-up appointments with your healthcare provider that is prescribing your Eliquis.    What do you do if you miss a dose? If a dose of ELIQUIS is not taken at the scheduled time, take it as soon as possible on the same day and twice-daily administration should be resumed. The dose should not be doubled to make up for a missed dose.  Important Safety Information A possible side effect of Eliquis is bleeding. You should call your healthcare provider right away if you experience any of the following: Bleeding from an injury or your nose that does not stop. Unusual colored urine (red or dark brown) or unusual colored stools (red or black). Unusual bruising for unknown reasons. A serious fall or if you hit your head (even if there is no bleeding).  Some medicines  may interact with Eliquis and might increase your risk of bleeding or clotting while on Eliquis. To help avoid this, consult your healthcare provider or pharmacist prior to using any new prescription or non-prescription medications, including herbals, vitamins, non-steroidal anti-inflammatory drugs (NSAIDs) and supplements.  This website has more information on Eliquis (apixaban): http://[email or web address].com/eliquis/home

## 2024-05-08 NOTE — Evaluation (Signed)
 Physical Therapy Evaluation Patient Details Name: Jeffrey Stewart MRN: [id number] DOB: 11-18-1934 Today's Date: 05/08/2024  History of Present Illness  89 y.o. M adm 05/06/24 with bil leg edema, LLE DVT and acute on chronic CHF. PMH: Rt hip fx 04/06/24 s/p ORIF, HFrEF, CAD, RA, and HLD, RBBB, ILD, glaucoma  Clinical Impression  PT pleasant and willing to mobilize to gain strength and return home. Pt with 2 recent hospitalizations with hip fx and GIB bleed which have assisted in his decline in function. Pt walking at home with assist of RW and family assisting for transfers and IADLs. Pt with decreased strength, transfers, gait and function who will benefit from acute therapy along with HHPT to maximize independence.  SPO2 99% on RA       If plan is discharge home, recommend the following: A little help with walking and/or transfers;A little help with bathing/dressing/bathroom;Assistance with cooking/housework;Assist for transportation;Help with stairs or ramp for entrance   Can travel by private vehicle   Yes    Equipment Recommendations None recommended by PT  Recommendations for Other Services       Functional Status Assessment Patient has had a recent decline in their functional status and demonstrates the ability to make significant improvements in function in a reasonable and predictable amount of time.     Precautions / Restrictions Precautions Precautions: Fall Recall of Precautions/Restrictions: Intact Restrictions Weight Bearing Restrictions Per Provider Order: No RLE Weight Bearing Per Provider Order: Weight bearing as tolerated      Mobility  Bed Mobility Overal bed mobility: Needs Assistance Bed Mobility: Supine to Sit, Sit to Supine     Supine to sit: HOB elevated, Min assist     General bed mobility comments: min assist to clear RLE off bed and lift it back to surface. HOB 25 degrees. sliding toward [facility] with use of head board min assist     Transfers Overall transfer level: Needs assistance   Transfers: Sit to/from Stand Sit to Stand: Min assist, Contact guard assist           General transfer comment: low bed surface with min assist to rise, pt pushing into side cross bar of RW for support to rise with initial trial. Performed 5 repeated sit to stands from elevated bed height with pt CGA and cues for hand placement. after gait and practice from high surface pt able to rise from low bed height x 4 trials with hands on mattress and no physical assist    Ambulation/Gait Ambulation/Gait assistance: Contact guard assist Gait Distance (Feet): 100 Feet Assistive device: Rolling walker (2 wheels) Gait Pattern/deviations: Step-through pattern, Decreased stride length, Trunk flexed   Gait velocity interpretation: 1.31 - 2.62 ft/sec, indicative of limited community ambulator   General Gait Details: cues for posture, pt able to self-regulate distance  Stairs            Wheelchair Mobility     Tilt Bed    Modified Rankin (Stroke Patients Only)       Balance Overall balance assessment: Needs assistance Sitting-balance support: No upper extremity supported, Feet supported Sitting balance-Leahy Scale: Fair Sitting balance - Comments: sitting EOB   Standing balance support: Bilateral upper extremity supported, During functional activity, Reliant on assistive device for balance Standing balance-Leahy Scale: Poor Standing balance comment: Reliant on RW                             Pertinent Vitals/Pain  Pain Assessment Faces Pain Scale: Hurts a little bit Pain Location: Rt hip Pain Descriptors / Indicators: Discomfort, Grimacing, Aching Pain Intervention(s): Limited activity within patient's tolerance, Monitored during session, Repositioned    Home Living Family/patient expects to be discharged to:: Private residence Living Arrangements: Spouse/significant other Available Help at Discharge:  Family;Available 24 hours/day Type of Home: House Home Access: Ramped entrance       Home Layout: One level Home Equipment: BSC/3in1;Grab bars - tub/shower;Rolling Walker (2 wheels);Tub bench;Cane - single point;Shower seat;Grab bars - toilet;Hand held shower head;Lift chair;Wheelchair - manual      Prior Function Prior Level of Function : Needs assist             Mobility Comments: Since hip fx pt has been using RW for mobility, working with HHPT, assist to stabilize RW to stand from lower surface ADLs Comments: Since hip fx, reports assistance for LB ADLs     Extremity/Trunk Assessment   Upper Extremity Assessment Upper Extremity Assessment: Defer to OT evaluation    Lower Extremity Assessment Lower Extremity Assessment: Generalized weakness    Cervical / Trunk Assessment Cervical / Trunk Assessment: Other exceptions Cervical / Trunk Exceptions: Forward head posture with rounded shoulders  Communication   Communication Communication: No apparent difficulties    Cognition Arousal: Alert Behavior During Therapy: WFL for tasks assessed/performed   PT - Cognitive impairments: No apparent impairments                         Following commands: Intact       Cueing Cueing Techniques: Verbal cues     General Comments      Exercises General Exercises - Lower Extremity Long Arc Quad: AROM, Both, 10 reps, Seated, Strengthening Hip Flexion/Marching: AROM, AAROM, Right, Left, Seated, Strengthening, 10 reps (AAROM on RLE)   Assessment/Plan    PT Assessment Patient needs continued PT services  PT Problem List Decreased strength;Decreased activity tolerance;Decreased balance;Decreased mobility;Decreased knowledge of use of DME;Decreased knowledge of precautions       PT Treatment Interventions DME instruction;Gait training;Stair training;Functional mobility training;Therapeutic activities;Therapeutic exercise;Balance training;Patient/family education     PT Goals (Current goals can be found in the Care Plan section)  Acute Rehab PT Goals Patient Stated Goal: return home, get back to fishing PT Goal Formulation: With patient/family Time For Goal Achievement: 05/22/24 Potential to Achieve Goals: Good    Frequency Min 2X/week     Co-evaluation               AM-PAC PT 6 Clicks Mobility  Outcome Measure Help needed turning from your back to your side while in a flat bed without using bedrails?: A Little Help needed moving from lying on your back to sitting on the side of a flat bed without using bedrails?: A Little Help needed moving to and from a bed to a chair (including a wheelchair)?: A Little Help needed standing up from a chair using your arms (e.g., wheelchair or bedside chair)?: A Little Help needed to walk in hospital room?: A Little Help needed climbing 3-5 steps with a railing? : A Lot 6 Click Score: 17    End of Session Equipment Utilized During Treatment: Gait belt Activity Tolerance: Patient tolerated treatment well Patient left: in bed;with call bell/phone within reach;with family/visitor present Nurse Communication: Mobility status PT Visit Diagnosis: Unsteadiness on feet (R26.81);Muscle weakness (generalized) (M62.81);Difficulty in walking, not elsewhere classified (R26.2);Other abnormalities of gait and mobility (R26.89);Pain Pain - Right/Left:  Right Pain - part of body: Hip    Time: 8873-8848 PT Time Calculation (min) (ACUTE ONLY): 25 min   Charges:   PT Evaluation $PT Eval Moderate Complexity: 1 Mod PT Treatments $Gait Training: 8-22 mins PT General Charges $$ ACUTE PT VISIT: 1 Visit         Lenoard SQUIBB, PT Acute Rehabilitation Services Office: [phone]   Lenoard NOVAK Felesha Moncrieffe 05/08/2024, 1:17 PM

## 2024-05-08 NOTE — Evaluation (Signed)
 Occupational Therapy Evaluation Patient Details Name: Jeffrey Stewart MRN: [id number] DOB: [birth year] Today's Date: 05/08/2024   History of Present Illness   89 y.o. M adm 05/06/24 with bil leg edema and LLE DVT. PMH: Rt hip fx 04/06/24 s/p ORIF, HFrEF, CAD, RA, and HLD, RBBB, ILD, glaucoma     Clinical Impressions Pt admitted based on above, and was seen based on problem list below. Since recent hospital admission for hip fx, pt has been at home receiving assistance with LB ADLs and using RW with 24/7 supervision from family. Today pt is requiring set up  to mod assist for ADLs. Bed mobility and functional transfers are  mod assist. Pt limited by decreased strength, balance, and decreased activity tolerance. Pt with all necessary DME at home, and significant family support. Recommending  HHOT to improve independence levels. OT will continue to follow acutely.     If plan is discharge home, recommend the following:   A lot of help with walking and/or transfers;A lot of help with bathing/dressing/bathroom;Assist for transportation;Assistance with cooking/housework     Functional Status Assessment   Patient has had a recent decline in their functional status and demonstrates the ability to make significant improvements in function in a reasonable and predictable amount of time.     Equipment Recommendations   None recommended by OT      Precautions/Restrictions   Precautions Precautions: Fall Recall of Precautions/Restrictions: Intact Restrictions Weight Bearing Restrictions Per Provider Order: No RLE Weight Bearing Per Provider Order: Weight bearing as tolerated     Mobility Bed Mobility Overal bed mobility: Needs Assistance Bed Mobility: Supine to Sit     Supine to sit: Min assist, HOB elevated, Used rails     General bed mobility comments: Assist for RLE, pt reliant on elevated bed height and rails to stand    Transfers Overall transfer level: Needs  assistance Equipment used: Rolling walker (2 wheels) Transfers: Sit to/from Stand, Bed to chair/wheelchair/BSC Sit to Stand: Mod assist     Step pivot transfers: Contact guard assist     General transfer comment: Mod assist for inital STS, once in standing CGA for balance to sequence transfer      Balance Overall balance assessment: Needs assistance Sitting-balance support: No upper extremity supported, Feet supported Sitting balance-Leahy Scale: Fair     Standing balance support: Bilateral upper extremity supported, During functional activity, Reliant on assistive device for balance Standing balance-Leahy Scale: Poor Standing balance comment: Reliant on RW     ADL either performed or assessed with clinical judgement   ADL Overall ADL's : Needs assistance/impaired Eating/Feeding: Set up;Sitting   Grooming: Set up;Sitting     Upper Body Dressing : Set up;Sitting   Lower Body Dressing: Moderate assistance;Sit to/from stand Lower Body Dressing Details (indicate cue type and reason): Assist for socks, thread legs, and stand Toilet Transfer: Moderate assistance;Stand-pivot;Rolling walker (2 wheels) Toilet Transfer Details (indicate cue type and reason): Assist to come to stand from elevated heights Toileting- Clothing Manipulation and Hygiene: Contact guard assist;Sit to/from stand Toileting - Clothing Manipulation Details (indicate cue type and reason): CGA for balance     Functional mobility during ADLs: Moderate assistance;Rolling walker (2 wheels) General ADL Comments: Limited by decreased BLE AROM     Vision Baseline Vision/History: 0 No visual deficits Vision Assessment?: No apparent visual deficits            Pertinent Vitals/Pain Pain Assessment Pain Assessment: Faces Faces Pain Scale: Hurts a little bit Pain Location:  R hip Pain Descriptors / Indicators: Discomfort, Grimacing Pain Intervention(s): Repositioned     Extremity/Trunk Assessment Upper  Extremity Assessment Upper Extremity Assessment: Generalized weakness   Lower Extremity Assessment Lower Extremity Assessment: Defer to PT evaluation       Communication Communication Communication: No apparent difficulties   Cognition Arousal: Alert Behavior During Therapy: WFL for tasks assessed/performed Cognition: No apparent impairments       Following commands: Intact       Cueing  General Comments   Cueing Techniques: Verbal cues  Significant BLEs and weeping in limbs, elevated  BLEs in chair           Home Living Family/patient expects to be discharged to:: Private residence Living Arrangements: Spouse/significant other Available Help at Discharge: Family;Available 24 hours/day Type of Home: House Home Access: Ramped entrance     Home Layout: One level     Bathroom Shower/Tub: Producer, television/film/video: Handicapped height Bathroom Accessibility: Yes How Accessible: Accessible via walker Home Equipment: BSC/3in1;Grab bars - tub/shower;Rolling Walker (2 wheels);Tub bench;Cane - single point;Shower seat;Grab bars - toilet;Hand held shower head;Lift chair;Wheelchair - manual          Prior Functioning/Environment Prior Level of Function : Needs assist     Mobility Comments: Since hip fx pt has been using RW for mobility, working with HHPT ADLs Comments: Since hip fx, reports assistance for LB ADLs    OT Problem List: Decreased strength;Decreased range of motion;Decreased activity tolerance;Decreased safety awareness;Decreased knowledge of use of DME or AE;Cardiopulmonary status limiting activity;Impaired balance (sitting and/or standing)   OT Treatment/Interventions: Self-care/ADL training;Therapeutic exercise;Energy conservation;DME and/or AE instruction;Therapeutic activities;Patient/family education;Balance training      OT Goals(Current goals can be found in the care plan section)   Acute Rehab OT Goals Patient Stated Goal: To get  OOB OT Goal Formulation: With patient Time For Goal Achievement: 05/22/24 Potential to Achieve Goals: Good   OT Frequency:  Min 2X/week       AM-PAC OT 6 Clicks Daily Activity     Outcome Measure Help from another person eating meals?: None Help from another person taking care of personal grooming?: A Little Help from another person toileting, which includes using toliet, bedpan, or urinal?: A Little Help from another person bathing (including washing, rinsing, drying)?: A Lot Help from another person to put on and taking off regular upper body clothing?: A Little Help from another person to put on and taking off regular lower body clothing?: A Lot 6 Click Score: 17   End of Session Equipment Utilized During Treatment: Gait belt;Rolling walker (2 wheels) Nurse Communication: Mobility status  Activity Tolerance: Patient tolerated treatment well Patient left: in chair;with call bell/phone within reach;with chair alarm set  OT Visit Diagnosis: Unsteadiness on feet (R26.81);Other abnormalities of gait and mobility (R26.89);Muscle weakness (generalized) (M62.81)                Time: 9248-9175 OT Time Calculation (min): 33 min Charges:  OT General Charges $OT Visit: 1 Visit OT Evaluation $OT Eval Moderate Complexity: 1 Mod OT Treatments $Self Care/Home Management : 8-22 mins  Adrianne [name], OT  Acute Rehabilitation Services Office [phone] Secure chat preferred   Adrianne GORMAN Savers 05/08/2024, 8:43 AM

## 2024-05-08 NOTE — Progress Notes (Signed)
 PROGRESS NOTE    Jeffrey Stewart  FMW:[id number] DOB: 1934-12-26 DOA: 05/06/2024 PCP: Anita Bernardino [name], FNP  89/M w interstitial lung disease, diastolic CHF, CAD,  rheumatoid arthritis, RBBB, right hip fracture s/p ORIF 03/2024, followed by recent hospitalization for GI bleed and hemorrhagic shock who presented to [facility] for evaluation of bilateral lower extremity swelling.  - Recent long hospitalization at [facility] from 9/29-10/6 for hemorrhagic shock, acute blood loss anemia, required 5 units PRBC, pressors, EGD noted multiple nonbleeding gastric ulcers , eventually stabilized and discharged home . - Patient noticed lower extremity swelling during the hospitalization which progressively worsened then developed some purplish discoloration and ecchymosis in his lower legs, presented to the ED 10/21. -In the ER vital signs stable, BNP 360, troponin 37, WBC 10.9, hemoglobin 10.3, chest x-ray with cardiomegaly and small bilateral pleural effusions, lower extremity duplex ultrasound noted DVT in left peroneal vein   Subjective: - Feels a little better, swelling starting to improve, Unna boots on  Assessment and plan   Acute LLE DVT - Patient with a recent ORIF with intramedullary nailing rod on 9/22 followed by quick rehospitalization from 9/29 - 10/6  - LE U/S shows a DVT in the left peroneal vein - Switch from IV heparin to Eliquis today, recommend anticoagulation for 3-6 months total, monitor for recurrent bleeding   Acute on chronic diastolic HF - Last TTE on 08/2021 shows EF 50%, LV global hypokinesis, severe LVH, and G1DD - Likely worsened by her recent hospitalization for hemorrhagic shock, diffuse 5 units PRBC  - Improving with diuresis, 5 L negative yesterday, cut down IV Lasix  to 60 mg twice daily, continue Aldactone  - Repeat echo with EF 45-50%, grade 1 DD, mildly reduced RV, moderately elevated PASP -  Unna boots   Normocytic anemia Recent upper GI bleed and hemorrhagic shock,  gastric ulcers - Hgb stable following recent discharge -Continue Protonix 40 mg twice daily - Continue iron supplementation   # HTN - BP stable with SBP in the 110-120s   # Rheumatoid arthritis - Continue CellCept  and prednisone    # HLD - Continue atorvastatin  and omega-3 fatty acid   # Hx of right hip fracture s/p ORIF # History of arthritis - Follow-up with orthopedic surgery in the outpatient - PT/OT eval and treat   DVT prophylaxis: Heparin  Code Status: Discussed CODE STATUS with the patient, now wishes for DNR Family Communication: Wife and daughter at bedside Disposition Plan: Home likely 2 to 3 days  Consultants:    Procedures:   Antimicrobials:    Objective: Vitals:   05/07/24 2336 05/08/24 0313 05/08/24 0413 05/08/24 0855  BP: (!) 103/50  120/60 107/60  Pulse: 96  97 (!) 117  Resp: 20  19 20   Temp: 98.2 F (36.8 C)  98.7 F (37.1 C) 98.4 F (36.9 C)  TempSrc: Oral  Oral Oral  SpO2: 99%  98% 100%  Weight:  90.4 kg    Height:        Intake/Output Summary (Last 24 hours) at 05/08/2024 0946 Last data filed at 05/08/2024 0622 Gross per 24 hour  Intake 859.86 ml  Output 4050 ml  Net -3190.14 ml   Filed Weights   05/06/24 1930 05/07/24 0330 05/08/24 0313  Weight: 90.1 kg 90.1 kg 90.4 kg    Examination:  General exam: Appears calm and comfortable, chronically ill-appearing Respiratory system: decreased breath sounds at the bases Cardiovascular system: S1 & S2 heard, RRR.  Abd: nondistended, soft and nontender.Normal bowel  sounds heard. Central nervous system: Alert and oriented. No focal neurological deficits. Extremities: 2+ edema with discoloration, Unna boots on today Skin: No rashes Psychiatry:  Mood & affect appropriate.     Data Reviewed:   CBC: Recent Labs  Lab 05/06/24 1224 05/07/24 0237 05/08/24 0224  WBC 10.9* 8.1 8.7  HGB 10.3* 9.0* 9.4*  HCT 33.1* 29.5* 30.3*  MCV 105.8* 106.1* 102.4*  PLT 148* 163 177   Basic  Metabolic Panel: Recent Labs  Lab 05/06/24 1224 05/07/24 0237 05/08/24 0224  NA 141 139 137  K 4.0 3.9 3.7  CL 103 106 99  CO2 26 23 28   GLUCOSE 125* 99 126*  BUN 28* 23 22  CREATININE 1.00 1.01 1.25*  CALCIUM  9.4 8.2* 8.3*  MG  --  2.0  --    GFR: Estimated Creatinine Clearance: 45.7 mL/min (A) (by C-G formula based on SCr of 1.25 mg/dL (H)). Liver Function Tests: No results for input(s): AST, ALT, ALKPHOS, BILITOT, PROT, ALBUMIN  in the last 168 hours. No results for input(s): LIPASE, AMYLASE in the last 168 hours. No results for input(s): AMMONIA in the last 168 hours. Coagulation Profile: No results for input(s): INR, PROTIME in the last 168 hours. Cardiac Enzymes: No results for input(s): CKTOTAL, CKMB, CKMBINDEX, TROPONINI in the last 168 hours. BNP (last 3 results) Recent Labs    05/06/24 1224  PROBNP 360.0*   HbA1C: No results for input(s): HGBA1C in the last 72 hours. CBG: No results for input(s): GLUCAP in the last 168 hours. Lipid Profile: No results for input(s): CHOL, HDL, LDLCALC, TRIG, CHOLHDL, LDLDIRECT in the last 72 hours. Thyroid Function Tests: No results for input(s): TSH, T4TOTAL, FREET4, T3FREE, THYROIDAB in the last 72 hours. Anemia Panel: No results for input(s): VITAMINB12, FOLATE, FERRITIN, TIBC, IRON, RETICCTPCT in the last 72 hours. Urine analysis:    Component Value Date/Time   COLORURINE YELLOW 04/14/2024 2215   APPEARANCEUR HAZY (A) 04/14/2024 2215   LABSPEC 1.020 04/14/2024 2215   PHURINE 5.0 04/14/2024 2215   GLUCOSEU NEGATIVE 04/14/2024 2215   HGBUR NEGATIVE 04/14/2024 2215   BILIRUBINUR NEGATIVE 04/14/2024 2215   KETONESUR NEGATIVE 04/14/2024 2215   PROTEINUR NEGATIVE 04/14/2024 2215   UROBILINOGEN 1.0 09/24/2013 0905   NITRITE NEGATIVE 04/14/2024 2215   LEUKOCYTESUR NEGATIVE 04/14/2024 2215   Sepsis Labs: @LABRCNTIP (procalcitonin:4,lacticidven:4)  )No  results found for this or any previous visit (from the past 240 hours).   Radiology Studies: ECHOCARDIOGRAM COMPLETE Result Date: 05/07/2024    ECHOCARDIOGRAM REPORT   Patient Name:   COLDEN SAMARAS Date of Exam: 05/07/2024 Medical Rec #:  [id number]      Height:       72.0 in Accession #:    [id number]     Weight:       198.6 lb Date of Birth:  11/07/1934       BSA:          2.124 m Patient Age:    89 years       BP:           118/54 mmHg Patient Gender: M              HR:           90 bpm. Exam Location:  Inpatient Procedure: 2D Echo, Cardiac Doppler and Color Doppler (Both Spectral and Color            Flow Doppler were utilized during procedure). Indications:    CHF  History:  Patient has prior history of Echocardiogram examinations, most                 recent 08/25/2021. CHF, CAD, Arrythmias:RBBB,                 Signs/Symptoms:Shortness of Breath and Edema; Risk                 Factors:Hypertension and Dyslipidemia.  Sonographer:    Juliene Rucks Referring Phys: [id number] PROSPER M AMPONSAH IMPRESSIONS  1. Left ventricular ejection fraction, by estimation, is 45 to 50%. The left ventricle has mildly decreased function. The left ventricle demonstrates global hypokinesis. There is moderate asymmetric left ventricular hypertrophy of the basal-septal segment. Left ventricular diastolic parameters are consistent with Grade I diastolic dysfunction (impaired relaxation).  2. Right ventricular systolic function is mildly reduced. The right ventricular size is mildly enlarged. There is moderately elevated pulmonary artery systolic pressure. The estimated right ventricular systolic pressure is 45.5 mmHg.  3. The mitral valve is normal in structure. Trivial mitral valve regurgitation. No evidence of mitral stenosis.  4. The aortic valve is grossly normal. Aortic valve regurgitation is mild to moderate. No aortic stenosis is present.  5. Aortic dilatation noted. There is mild dilatation of the aortic root, measuring  41 mm.  6. The inferior vena cava is dilated in size with <50% respiratory variability, suggesting right atrial pressure of 15 mmHg. Comparison(s): No significant change from prior study. FINDINGS  Left Ventricle: Left ventricular ejection fraction, by estimation, is 45 to 50%. The left ventricle has mildly decreased function. The left ventricle demonstrates global hypokinesis. The left ventricular internal cavity size was normal in size. There is  moderate asymmetric left ventricular hypertrophy of the basal-septal segment. Left ventricular diastolic parameters are consistent with Grade I diastolic dysfunction (impaired relaxation). Right Ventricle: The right ventricular size is mildly enlarged. Right vetricular wall thickness was not well visualized. Right ventricular systolic function is mildly reduced. There is moderately elevated pulmonary artery systolic pressure. The tricuspid  regurgitant velocity is 2.76 m/s, and with an assumed right atrial pressure of 15 mmHg, the estimated right ventricular systolic pressure is 45.5 mmHg. Left Atrium: Left atrial size was normal in size. Right Atrium: Right atrial size was normal in size. Pericardium: There is no evidence of pericardial effusion. Presence of epicardial fat layer. Mitral Valve: The mitral valve is normal in structure. Trivial mitral valve regurgitation. No evidence of mitral valve stenosis. Tricuspid Valve: The tricuspid valve is normal in structure. Tricuspid valve regurgitation is mild . No evidence of tricuspid stenosis. Aortic Valve: The aortic valve is grossly normal. Aortic valve regurgitation is mild to moderate. Aortic regurgitation PHT measures 298 msec. No aortic stenosis is present. Aortic valve mean gradient measures 5.0 mmHg. Aortic valve peak gradient measures  8.8 mmHg. Aortic valve area, by VTI measures 3.73 cm. Pulmonic Valve: The pulmonic valve was not well visualized. Pulmonic valve regurgitation is not visualized. No evidence of  pulmonic stenosis. Aorta: Aortic dilatation noted. There is mild dilatation of the aortic root, measuring 41 mm. Venous: The inferior vena cava is dilated in size with less than 50% respiratory variability, suggesting right atrial pressure of 15 mmHg. IAS/Shunts: The atrial septum is grossly normal.  LEFT VENTRICLE PLAX 2D LVIDd:         5.60 cm   Diastology LVIDs:         4.00 cm   LV e' medial:    5.87 cm/s LV PW:  1.10 cm   LV E/e' medial:  8.7 LV IVS:        1.50 cm   LV e' lateral:   9.68 cm/s LVOT diam:     2.40 cm   LV E/e' lateral: 5.2 LV SV:         95 LV SV Index:   45 LVOT Area:     4.52 cm  RIGHT VENTRICLE            IVC RV Basal diam:  4.10 cm    IVC diam: 2.30 cm RV Mid diam:    3.75 cm RV S prime:     8.27 cm/s LEFT ATRIUM             Index        RIGHT ATRIUM           Index LA diam:        3.90 cm 1.84 cm/m   RA Area:     16.50 cm LA Vol (A2C):   49.5 ml 23.30 ml/m  RA Volume:   46.50 ml  21.89 ml/m LA Vol (A4C):   43.8 ml 20.62 ml/m LA Biplane Vol: 49.1 ml 23.11 ml/m  AORTIC VALVE AV Area (Vmax):    3.64 cm AV Area (Vmean):   3.46 cm AV Area (VTI):     3.73 cm AV Vmax:           148.00 cm/s AV Vmean:          105.000 cm/s AV VTI:            0.256 m AV Peak Grad:      8.8 mmHg AV Mean Grad:      5.0 mmHg LVOT Vmax:         119.00 cm/s LVOT Vmean:        80.200 cm/s LVOT VTI:          0.211 m LVOT/AV VTI ratio: 0.82 AI PHT:            298 msec  AORTA Ao Root diam: 4.10 cm Ao Asc diam:  3.30 cm MITRAL VALVE               TRICUSPID VALVE MV Area (PHT): 4.15 cm    TR Peak grad:   30.5 mmHg MV Decel Time: 183 msec    TR Vmax:        276.00 cm/s MR Peak grad: 53.3 mmHg MR Vmax:      365.00 cm/s  SHUNTS MV E velocity: 50.80 cm/s  Systemic VTI:  0.21 m MV A velocity: 88.30 cm/s  Systemic Diam: 2.40 cm MV E/A ratio:  0.58 Shelda Bruckner MD Electronically signed by Shelda Bruckner MD Signature Date/Time: 05/07/2024/8:35:23 PM    Final    US  Venous Img Lower Bilateral Result  Date: 05/06/2024 CLINICAL DATA:  Bilateral lower extremity edema. EXAM: BILATERAL LOWER EXTREMITY VENOUS DOPPLER ULTRASOUND TECHNIQUE: Gray-scale sonography with graded compression, as well as color Doppler and duplex ultrasound were performed to evaluate the lower extremity deep venous systems from the level of the common femoral vein and including the common femoral, femoral, profunda femoral, popliteal and calf veins including the posterior tibial, peroneal and gastrocnemius veins when visible. The superficial great saphenous vein was also interrogated. Spectral Doppler was utilized to evaluate flow at rest and with distal augmentation maneuvers in the common femoral, femoral and popliteal veins. COMPARISON:  None Available. FINDINGS: RIGHT LOWER EXTREMITY Common Femoral Vein: No evidence of thrombus. Normal  compressibility, respiratory phasicity and response to augmentation. Saphenofemoral Junction: No evidence of thrombus. Normal compressibility and flow on color Doppler imaging. Profunda Femoral Vein: No evidence of thrombus. Normal compressibility and flow on color Doppler imaging. Femoral Vein: No evidence of thrombus. Normal compressibility, respiratory phasicity and response to augmentation. Popliteal Vein: No evidence of thrombus. Normal compressibility, respiratory phasicity and response to augmentation. Calf Veins: No evidence of thrombus. Normal compressibility and flow on color Doppler imaging. Superficial Great Saphenous Vein: No evidence of thrombus. Normal compressibility. Venous Reflux:  None. Other Findings: No evidence of superficial thrombophlebitis or abnormal fluid collection. LEFT LOWER EXTREMITY Common Femoral Vein: No evidence of thrombus. Normal compressibility, respiratory phasicity and response to augmentation. Saphenofemoral Junction: No evidence of thrombus. Normal compressibility and flow on color Doppler imaging. Profunda Femoral Vein: No evidence of thrombus. Normal  compressibility and flow on color Doppler imaging. Femoral Vein: No evidence of thrombus. Normal compressibility, respiratory phasicity and response to augmentation. Popliteal Vein: No evidence of thrombus. Normal compressibility, respiratory phasicity and response to augmentation. Calf Veins: Thrombus in the visualized left peroneal vein. The posterior tibial vein appears normally patent. Superficial Great Saphenous Vein: No evidence of thrombus. Normal compressibility. Venous Reflux:  None. Other Findings: No evidence of superficial thrombophlebitis or abnormal fluid collection. IMPRESSION: 1. Deep vein thrombosis in the left peroneal vein. 2. No evidence of deep vein thrombosis in the right lower extremity. Electronically Signed   By: Marcey Moan M.D.   On: 05/06/2024 14:11   DG Chest 2 View Result Date: 05/06/2024 EXAM: 2 VIEW(S) XRAY OF THE CHEST 05/06/2024 11:55:00 AM COMPARISON: 04/15/2024. Chest CT of 04/16/2024. CLINICAL HISTORY: SHOB. Table formatting from the original note was not included.; Images from the original note were not included.; Reports bilateral leg swelling and redness. Hx of cellulitis. Recently in ICU for similar swelling. FINDINGS: LUNGS AND PLEURA: Hyperinflation. Right upper lobe pulmonary nodule was detailed on prior chest CT. Left base scarring and/or subsegmental atelectasis. Tiny bilateral pleural effusions. No pulmonary edema. No pneumothorax. HEART AND MEDIASTINUM: Mild cardiomegaly. Transverse aortic atherosclerosis. BONES AND SOFT TISSUES: No acute osseous abnormality. IMPRESSION: 1. Cardiomegaly with small bilateral pleural effusions and left base atelectasis. 2. . Right upper lobe pulmonary nodule, as on prior CT. Electronically signed by: Rockey Kilts MD 05/06/2024 01:32 PM EDT RP Workstation: [id number]     Scheduled Meds:  apixaban  10 mg Oral BID   Followed by   NOREEN ON 05/15/2024] apixaban  5 mg Oral BID   atorvastatin   40 mg Oral QHS   brimonidine   1  drop Both Eyes BID   cholecalciferol   1,000 Units Oral Daily   ferrous sulfate  325 mg Oral Q breakfast   fluticasone  furoate-vilanterol  1 puff Inhalation Daily   furosemide   60 mg Intravenous BID   latanoprost   1 drop Both Eyes QHS   loratadine   10 mg Oral Daily   mycophenolate   1,000 mg Oral BID   omega-3 acid ethyl esters  1 g Oral BID   pantoprazole  40 mg Oral BID   [START ON 05/09/2024] potassium chloride  SA  40 mEq Oral Daily   predniSONE   1 mg Oral QHS   predniSONE   5 mg Oral Q breakfast   spironolactone  25 mg Oral Daily   Continuous Infusions:     LOS: 2 days    Time spent:    Sigurd Pac, MD [facility]   05/08/2024, 9:46 AM

## 2024-05-08 NOTE — Progress Notes (Signed)
 PHARMACY - ANTICOAGULATION CONSULT NOTE  Pharmacy Consult for Heparin transition to apixaban Indication: DVT  Allergies  Allergen Reactions   Niaspan  [Niacin ] Other (See Comments)    Intolerance flush feeling when on large dose - is able to take smaller dosage without problem    Meloxicam Other (See Comments)    Disorientation     Patient Measurements: Height: 6' (182.9 cm) Weight: 90.4 kg (199 lb 4.7 oz) IBW/kg (Calculated) : 77.6 HEPARIN DW (KG): 90.1  Vital Signs: Temp: 98.7 F (37.1 C) (10/23 0413) Temp Source: Oral (10/23 0413) BP: 120/60 (10/23 0413) Pulse Rate: 97 (10/23 0413)  Labs: Recent Labs    05/06/24 1224 05/07/24 0237 05/07/24 1222 05/08/24 0224  HGB 10.3* 9.0*  --  9.4*  HCT 33.1* 29.5*  --  30.3*  PLT 148* 163  --  177  HEPARINUNFRC  --  0.47 0.51 0.54  CREATININE 1.00 1.01  --  1.25*    Estimated Creatinine Clearance: 45.7 mL/min (A) (by C-G formula based on SCr of 1.25 mg/dL (H)).   Medical History: Past Medical History:  Diagnosis Date   Allergy    Arthritis    MINOR ARTHRITIS FEET AND TOES   Asthma    Cataract    CHF (congestive heart failure) (HCC) 11/14/2009   EF45-50%   Coronary artery disease    Essential hypertension 05/07/2024   Glaucoma (increased eye pressure) 04/06/2024   Hyperlipidemia    Myocardial infarction (HCC)    Pneumonia    HX OF PNEUMONIA 4 OR 5 YRS AGO   Rosacea    OF FACE   Shortness of breath    SOMETIMES SOB OR WHEEZING WITH EXERTION   Symptomatic anemia 04/18/2024    Assessment: 89 YOM presenting with leg swelling, positive ultrasound for DVT, he is not on anticoagulation PTA, chronic anemia stable however recent GI bleed hx  10/23 AM update: HL 0.54  Hgb 9.4 PLT wnl No signs of bleeding / pauses w/ gtt  Goal of Therapy:  Heparin level 0.3-0.5 units/mL-recent hx of GIB Monitor platelets by anticoagulation protocol: Yes   Plan:  Stop heparin gtt Start apixaban 10 mg po bid x7 days f/b 5 mg po  bid thereafter Monitor for signs of bleeding    Sheyann Sulton BS, PharmD, BCPS Clinical Pharmacist 05/08/2024 7:02 AM  Contact: [phone] after 3 PM

## 2024-05-09 DIAGNOSIS — I82402 Acute embolism and thrombosis of unspecified deep veins of left lower extremity: Secondary | ICD-10-CM | POA: Diagnosis not present

## 2024-05-09 LAB — BASIC METABOLIC PANEL WITH GFR
Anion gap: 10 (ref 5–15)
BUN: 22 mg/dL (ref 8–23)
CO2: 27 mmol/L (ref 22–32)
Calcium: 8.2 mg/dL — ABNORMAL LOW (ref 8.9–10.3)
Chloride: 98 mmol/L (ref 98–111)
Creatinine, Ser: 1.31 mg/dL — ABNORMAL HIGH (ref 0.61–1.24)
GFR, Estimated: 53 mL/min — ABNORMAL LOW (ref >60)
Glucose, Bld: 125 mg/dL — ABNORMAL HIGH (ref 70–99)
Potassium: 3.7 mmol/L (ref 3.5–5.1)
Sodium: 135 mmol/L (ref 135–145)

## 2024-05-09 LAB — CBC
HCT: 30.1 % — ABNORMAL LOW (ref 39.0–52.0)
Hemoglobin: 9.4 g/dL — ABNORMAL LOW (ref 13.0–17.0)
MCH: 31.8 pg (ref 26.0–34.0)
MCHC: 31.2 g/dL (ref 30.0–36.0)
MCV: 101.7 fL — ABNORMAL HIGH (ref 80.0–100.0)
Platelets: 185 K/uL (ref 150–400)
RBC: 2.96 MIL/uL — ABNORMAL LOW (ref 4.22–5.81)
RDW: 21.5 % — ABNORMAL HIGH (ref 11.5–15.5)
WBC: 9 K/uL (ref 4.0–10.5)
nRBC: 0 % (ref 0.0–0.2)

## 2024-05-09 MED ORDER — LIVING BETTER WITH HEART FAILURE BOOK
Freq: Once | Status: AC
Start: 2024-05-09 — End: 2024-05-10

## 2024-05-09 MED ORDER — FUROSEMIDE 10 MG/ML IJ SOLN
60.0000 mg | Freq: Two times a day (BID) | INTRAMUSCULAR | Status: AC
  Administered 2024-05-09: 60 mg via INTRAVENOUS
  Filled 2024-05-09: qty 6

## 2024-05-09 MED ORDER — EMPAGLIFLOZIN 10 MG PO TABS
10.0000 mg | ORAL_TABLET | Freq: Every day | ORAL | Status: DC
  Administered 2024-05-09 – 2024-05-11 (×3): 10 mg via ORAL
  Filled 2024-05-09 (×3): qty 1

## 2024-05-09 MED ORDER — TORSEMIDE 20 MG PO TABS
40.0000 mg | ORAL_TABLET | Freq: Every day | ORAL | Status: DC
  Administered 2024-05-10 – 2024-05-11 (×2): 40 mg via ORAL
  Filled 2024-05-09 (×2): qty 2

## 2024-05-09 NOTE — Progress Notes (Signed)
 Heart Failure Navigator Progress Note  Assessed for Heart & Vascular TOC clinic readiness.   TOC appt scheduled for 11/5 at 9:45. Discussed with patient and family. Appt reminder card provided.   Duwaine Plant, PharmD, BCPS Heart Failure Stewardship Pharmacist Phone [phone]

## 2024-05-09 NOTE — Progress Notes (Signed)
   Heart Failure Stewardship Pharmacist Progress Note   PCP: Anita Bernardino [name], FNP PCP-Cardiologist: Oneil Parchment, MD    HPI:  89 yo M with PMH of CHF, HTN, HLD, ILD, RA, and CAD.   S/p ORIF with intramedullary nailing rod on 9/22.  Recenly admitted from 9/29-10/6 due to GI bleeding secondary to gastric ulcers complicated by hemorrhagic shock.   Presented to the ED on 10/21 with worsening bilateral LE edema (started during previous hospitalization) and discoloration (new). proBNP 360. EKG showed sinus rhythm with RBBB. CXR with cardiomegaly and small bilateral pleural effusions. Received IV lasix  in the ED. Found to have acute left leg DVT and started on anticoagulation. ECHO on 10/22 with LVEF 45-50%, global hypokinesis, moderate asymmetric LVH, G1DD, RV mildly reduced.   Denies shortness of breath. States that he cannot lay flat normally due to ILD. LE edema continues to improve. UNNA boots in place.   Current HF Medications: Diuretic: furosemide  60 mg IV BID MRA: spironolactone 25 mg daily  Prior to admission HF Medications: Diuretic: torsemide  20 mg daily  Pertinent Lab Values: Serum creatinine 1.01>1.25>1.31, BUN 22, Potassium 3.7, Sodium 135, proBNP 360, Magnesium 2.0  Vital Signs: Weight: 194 lbs (admission weight: 198 lbs) Blood pressure: 110/60s  Heart rate: 90-110s  I/O: net -1.4L yesterday; net -6.2L since admission  Medication Assistance / Insurance Benefits Check: Does the patient have prescription insurance?  Yes Type of insurance plan: [employer]  Does the patient qualify for medication assistance through manufacturers or grants?   Pending - is ok with temporary Eliquis copay if he does not need to be on this lifelong  Outpatient Pharmacy:  Prior to admission outpatient pharmacy: [facility] Is the patient willing to use [facility] TOC pharmacy at discharge? Yes Is the patient willing to transition their outpatient pharmacy to utilize a [facility] outpatient pharmacy?    No    Assessment: 1. Acute on chronic diastolic CHF (LVEF 45-50%). NYHA class II symptoms. - Continue furosemide  60 mg IV BID. Strict I/Os and daily weights. Keep K>4 and Mg>2. KCl 40 mEq daily ordered for replacement.  - Continue spironolactone 25 mg daily - Consider adding Jardiance prior to discharge pending creatinine stabilization   Plan: 1) Medication changes recommended at this time: - Start Jardiance 10 mg daily tomorrow  2) Patient assistance: - Jardiance copay $47 - Farxiga copay [phone]  3)  Education  - Patient has been educated on current HF medications and potential additions to HF medication regimen - Patient verbalizes understanding that over the next few months, these medication doses may change and more medications may be added to optimize HF regimen - Patient has been educated on basic disease state pathophysiology and goals of therapy   Duwaine Plant, PharmD, BCPS Heart Failure Stewardship Pharmacist Phone [phone]

## 2024-05-09 NOTE — Progress Notes (Signed)
 PROGRESS NOTE    Jeffrey Stewart  FMW:[id number] DOB: 12-07-1934 DOA: 05/06/2024 PCP: Anita Bernardino [name], FNP  89/M w interstitial lung disease, diastolic CHF, CAD,  rheumatoid arthritis, RBBB, right hip fracture s/p ORIF 03/2024, followed by recent hospitalization for GI bleed and hemorrhagic shock who presented to [facility] for evaluation of bilateral lower extremity swelling.  - Recent long hospitalization at [facility] from 9/29-10/6 for hemorrhagic shock, acute blood loss anemia, required 5 units PRBC, pressors, EGD noted multiple nonbleeding gastric ulcers , eventually stabilized and discharged home . - Patient noticed lower extremity swelling during the hospitalization which progressively worsened then developed some purplish discoloration and ecchymosis in his lower legs, presented to the ED 10/21. -In the ER vital signs stable, BNP 360, troponin 37, WBC 10.9, hemoglobin 10.3, chest x-ray with cardiomegaly and small bilateral pleural effusions, lower extremity duplex ultrasound noted DVT in left peroneal vein   Subjective: - Feels better overall, breathing and swelling continues to improve  Assessment and plan   Acute LLE DVT - Patient with a recent ORIF with intramedullary nailing rod on 9/22 followed by quick rehospitalization from 9/29 - 10/6  - LE U/S shows a DVT in the left peroneal vein - Now on Eliquis, recommend anticoagulation for 3-6 months total, monitor for recurrent bleeding   Acute on chronic diastolic HF - Last TTE on 08/2021 shows EF 50%, LV global hypokinesis, severe LVH, and G1DD - Likely worsened by her recent hospitalization for hemorrhagic shock, diffuse 5 units PRBC  - Improving with diuresis, 6.3 L negative, stop IV Lasix  after today's dose, start torsemide  tomorrow, continue Aldactone, add Jardiance  - Repeat echo with EF 45-50%, grade 1 DD, mildly reduced RV, moderately elevated PASP -  Unna boots -Increase activity, discharge planning   Normocytic anemia Recent  upper GI bleed and hemorrhagic shock, gastric ulcers - Hgb stable following recent discharge -Continue Protonix 40 mg twice daily - Continue iron supplementation   # HTN - BP stable with SBP in the 110-120s   # Rheumatoid arthritis - Continue CellCept  and prednisone    # HLD - Continue atorvastatin  and omega-3 fatty acid   # Hx of right hip fracture s/p ORIF # History of arthritis - Follow-up with orthopedic surgery in the outpatient - PT/OT eval and treat   DVT prophylaxis: Heparin  Code Status: Discussed CODE STATUS with the patient, now wishes for DNR Family Communication: Wife and daughter at bedside Disposition Plan: Home likely tomorrow  Consultants:    Procedures:   Antimicrobials:    Objective: Vitals:   05/09/24 0007 05/09/24 0319 05/09/24 0446 05/09/24 0731  BP: (!) 117/56 (!) 108/53  102/63  Pulse: 98 97  97  Resp: 17 17  20   Temp: 98.6 F (37 C) 98.3 F (36.8 C)  (!) 97.4 F (36.3 C)  TempSrc: Oral Oral  Oral  SpO2: 96% 94%  96%  Weight:   88.2 kg   Height:        Intake/Output Summary (Last 24 hours) at 05/09/2024 1023 Last data filed at 05/09/2024 0940 Gross per 24 hour  Intake 1078 ml  Output 2750 ml  Net -1672 ml   Filed Weights   05/07/24 0330 05/08/24 0313 05/09/24 0446  Weight: 90.1 kg 90.4 kg 88.2 kg    Examination:  General exam: Appears calm and comfortable, chronically ill-appearing Respiratory system: decreased breath sounds at the bases Cardiovascular system: S1 & S2 heard, RRR.  Abd: nondistended, soft and nontender.Normal bowel sounds heard. Central  nervous system: Alert and oriented. No focal neurological deficits. Extremities: 1+ edema, Unna boots Skin: No rashes Psychiatry:  Mood & affect appropriate.     Data Reviewed:   CBC: Recent Labs  Lab 05/06/24 1224 05/07/24 0237 05/08/24 0224 05/09/24 0234  WBC 10.9* 8.1 8.7 9.0  HGB 10.3* 9.0* 9.4* 9.4*  HCT 33.1* 29.5* 30.3* 30.1*  MCV 105.8* 106.1* 102.4*  101.7*  PLT 148* 163 177 185   Basic Metabolic Panel: Recent Labs  Lab 05/06/24 1224 05/07/24 0237 05/08/24 0224 05/09/24 0234  NA 141 139 137 135  K 4.0 3.9 3.7 3.7  CL 103 106 99 98  CO2 26 23 28 27   GLUCOSE 125* 99 126* 125*  BUN 28* 23 22 22   CREATININE 1.00 1.01 1.25* 1.31*  CALCIUM  9.4 8.2* 8.3* 8.2*  MG  --  2.0  --   --    GFR: Estimated Creatinine Clearance: 43.6 mL/min (A) (by C-G formula based on SCr of 1.31 mg/dL (H)). Liver Function Tests: No results for input(s): AST, ALT, ALKPHOS, BILITOT, PROT, ALBUMIN  in the last 168 hours. No results for input(s): LIPASE, AMYLASE in the last 168 hours. No results for input(s): AMMONIA in the last 168 hours. Coagulation Profile: No results for input(s): INR, PROTIME in the last 168 hours. Cardiac Enzymes: No results for input(s): CKTOTAL, CKMB, CKMBINDEX, TROPONINI in the last 168 hours. BNP (last 3 results) Recent Labs    05/06/24 1224  PROBNP 360.0*   HbA1C: No results for input(s): HGBA1C in the last 72 hours. CBG: No results for input(s): GLUCAP in the last 168 hours. Lipid Profile: No results for input(s): CHOL, HDL, LDLCALC, TRIG, CHOLHDL, LDLDIRECT in the last 72 hours. Thyroid Function Tests: No results for input(s): TSH, T4TOTAL, FREET4, T3FREE, THYROIDAB in the last 72 hours. Anemia Panel: No results for input(s): VITAMINB12, FOLATE, FERRITIN, TIBC, IRON, RETICCTPCT in the last 72 hours. Urine analysis:    Component Value Date/Time   COLORURINE YELLOW 04/14/2024 2215   APPEARANCEUR HAZY (A) 04/14/2024 2215   LABSPEC 1.020 04/14/2024 2215   PHURINE 5.0 04/14/2024 2215   GLUCOSEU NEGATIVE 04/14/2024 2215   HGBUR NEGATIVE 04/14/2024 2215   BILIRUBINUR NEGATIVE 04/14/2024 2215   KETONESUR NEGATIVE 04/14/2024 2215   PROTEINUR NEGATIVE 04/14/2024 2215   UROBILINOGEN 1.0 09/24/2013 0905   NITRITE NEGATIVE 04/14/2024 2215   LEUKOCYTESUR  NEGATIVE 04/14/2024 2215   Sepsis Labs: @LABRCNTIP (procalcitonin:4,lacticidven:4)  )No results found for this or any previous visit (from the past 240 hours).   Radiology Studies: ECHOCARDIOGRAM COMPLETE Result Date: 05/07/2024    ECHOCARDIOGRAM REPORT   Patient Name:   Jeffrey Stewart Date of Exam: 05/07/2024 Medical Rec #:  [id number]      Height:       72.0 in Accession #:    [id number]     Weight:       198.6 lb Date of Birth:  07/03/1935       BSA:          2.124 m Patient Age:    89 years       BP:           118/54 mmHg Patient Gender: M              HR:           90 bpm. Exam Location:  Inpatient Procedure: 2D Echo, Cardiac Doppler and Color Doppler (Both Spectral and Color            Flow  Doppler were utilized during procedure). Indications:    CHF  History:        Patient has prior history of Echocardiogram examinations, most                 recent 08/25/2021. CHF, CAD, Arrythmias:RBBB,                 Signs/Symptoms:Shortness of Breath and Edema; Risk                 Factors:Hypertension and Dyslipidemia.  Sonographer:    Juliene Rucks Referring Phys: [id number] PROSPER M AMPONSAH IMPRESSIONS  1. Left ventricular ejection fraction, by estimation, is 45 to 50%. The left ventricle has mildly decreased function. The left ventricle demonstrates global hypokinesis. There is moderate asymmetric left ventricular hypertrophy of the basal-septal segment. Left ventricular diastolic parameters are consistent with Grade I diastolic dysfunction (impaired relaxation).  2. Right ventricular systolic function is mildly reduced. The right ventricular size is mildly enlarged. There is moderately elevated pulmonary artery systolic pressure. The estimated right ventricular systolic pressure is 45.5 mmHg.  3. The mitral valve is normal in structure. Trivial mitral valve regurgitation. No evidence of mitral stenosis.  4. The aortic valve is grossly normal. Aortic valve regurgitation is mild to moderate. No aortic stenosis is  present.  5. Aortic dilatation noted. There is mild dilatation of the aortic root, measuring 41 mm.  6. The inferior vena cava is dilated in size with <50% respiratory variability, suggesting right atrial pressure of 15 mmHg. Comparison(s): No significant change from prior study. FINDINGS  Left Ventricle: Left ventricular ejection fraction, by estimation, is 45 to 50%. The left ventricle has mildly decreased function. The left ventricle demonstrates global hypokinesis. The left ventricular internal cavity size was normal in size. There is  moderate asymmetric left ventricular hypertrophy of the basal-septal segment. Left ventricular diastolic parameters are consistent with Grade I diastolic dysfunction (impaired relaxation). Right Ventricle: The right ventricular size is mildly enlarged. Right vetricular wall thickness was not well visualized. Right ventricular systolic function is mildly reduced. There is moderately elevated pulmonary artery systolic pressure. The tricuspid  regurgitant velocity is 2.76 m/s, and with an assumed right atrial pressure of 15 mmHg, the estimated right ventricular systolic pressure is 45.5 mmHg. Left Atrium: Left atrial size was normal in size. Right Atrium: Right atrial size was normal in size. Pericardium: There is no evidence of pericardial effusion. Presence of epicardial fat layer. Mitral Valve: The mitral valve is normal in structure. Trivial mitral valve regurgitation. No evidence of mitral valve stenosis. Tricuspid Valve: The tricuspid valve is normal in structure. Tricuspid valve regurgitation is mild . No evidence of tricuspid stenosis. Aortic Valve: The aortic valve is grossly normal. Aortic valve regurgitation is mild to moderate. Aortic regurgitation PHT measures 298 msec. No aortic stenosis is present. Aortic valve mean gradient measures 5.0 mmHg. Aortic valve peak gradient measures  8.8 mmHg. Aortic valve area, by VTI measures 3.73 cm. Pulmonic Valve: The pulmonic valve  was not well visualized. Pulmonic valve regurgitation is not visualized. No evidence of pulmonic stenosis. Aorta: Aortic dilatation noted. There is mild dilatation of the aortic root, measuring 41 mm. Venous: The inferior vena cava is dilated in size with less than 50% respiratory variability, suggesting right atrial pressure of 15 mmHg. IAS/Shunts: The atrial septum is grossly normal.  LEFT VENTRICLE PLAX 2D LVIDd:         5.60 cm   Diastology LVIDs:  4.00 cm   LV e' medial:    5.87 cm/s LV PW:         1.10 cm   LV E/e' medial:  8.7 LV IVS:        1.50 cm   LV e' lateral:   9.68 cm/s LVOT diam:     2.40 cm   LV E/e' lateral: 5.2 LV SV:         95 LV SV Index:   45 LVOT Area:     4.52 cm  RIGHT VENTRICLE            IVC RV Basal diam:  4.10 cm    IVC diam: 2.30 cm RV Mid diam:    3.75 cm RV S prime:     8.27 cm/s LEFT ATRIUM             Index        RIGHT ATRIUM           Index LA diam:        3.90 cm 1.84 cm/m   RA Area:     16.50 cm LA Vol (A2C):   49.5 ml 23.30 ml/m  RA Volume:   46.50 ml  21.89 ml/m LA Vol (A4C):   43.8 ml 20.62 ml/m LA Biplane Vol: 49.1 ml 23.11 ml/m  AORTIC VALVE AV Area (Vmax):    3.64 cm AV Area (Vmean):   3.46 cm AV Area (VTI):     3.73 cm AV Vmax:           148.00 cm/s AV Vmean:          105.000 cm/s AV VTI:            0.256 m AV Peak Grad:      8.8 mmHg AV Mean Grad:      5.0 mmHg LVOT Vmax:         119.00 cm/s LVOT Vmean:        80.200 cm/s LVOT VTI:          0.211 m LVOT/AV VTI ratio: 0.82 AI PHT:            298 msec  AORTA Ao Root diam: 4.10 cm Ao Asc diam:  3.30 cm MITRAL VALVE               TRICUSPID VALVE MV Area (PHT): 4.15 cm    TR Peak grad:   30.5 mmHg MV Decel Time: 183 msec    TR Vmax:        276.00 cm/s MR Peak grad: 53.3 mmHg MR Vmax:      365.00 cm/s  SHUNTS MV E velocity: 50.80 cm/s  Systemic VTI:  0.21 m MV A velocity: 88.30 cm/s  Systemic Diam: 2.40 cm MV E/A ratio:  0.58 Shelda Bruckner MD Electronically signed by Shelda Bruckner MD  Signature Date/Time: 05/07/2024/8:35:23 PM    Final      Scheduled Meds:  apixaban  10 mg Oral BID   Followed by   NOREEN ON 05/15/2024] apixaban  5 mg Oral BID   atorvastatin   40 mg Oral QHS   brimonidine   1 drop Both Eyes BID   cholecalciferol   1,000 Units Oral Daily   empagliflozin  10 mg Oral Daily   ferrous sulfate  325 mg Oral Q breakfast   fluticasone  furoate-vilanterol  1 puff Inhalation Daily   furosemide   60 mg Intravenous BID   Gerhardt's butt cream   Topical BID   latanoprost   1  drop Both Eyes QHS   Living Better with Heart Failure Book   Does not apply Once   loratadine   10 mg Oral Daily   mycophenolate   1,000 mg Oral BID   omega-3 acid ethyl esters  1 g Oral BID   pantoprazole  40 mg Oral BID   potassium chloride  SA  40 mEq Oral Daily   predniSONE   1 mg Oral QHS   predniSONE   5 mg Oral Q breakfast   spironolactone  25 mg Oral Daily   [START ON 05/10/2024] torsemide   40 mg Oral Daily   Continuous Infusions:     LOS: 3 days    Time spent:    Sigurd Pac, MD [facility]   05/09/2024, 10:23 AM

## 2024-05-09 NOTE — Plan of Care (Signed)
  Problem: Education: Goal: Knowledge of General Education information will improve Description: Including pain rating scale, medication(s)/side effects and non-pharmacologic comfort measures Outcome: Progressing   Problem: Cardiac: Goal: Ability to achieve and maintain adequate cardiopulmonary perfusion will improve Outcome: Progressing   

## 2024-05-09 NOTE — Plan of Care (Signed)

## 2024-05-09 NOTE — Plan of Care (Signed)
  Problem: Education: Goal: Knowledge of General Education information will improve Description: Including pain rating scale, medication(s)/side effects and non-pharmacologic comfort measures Outcome: Progressing   Problem: Clinical Measurements: Goal: Ability to maintain clinical measurements within normal limits will improve Outcome: Progressing Goal: Will remain free from infection Outcome: Progressing Goal: Diagnostic test results will improve Outcome: Progressing Goal: Respiratory complications will improve Outcome: Progressing Goal: Cardiovascular complication will be avoided Outcome: Progressing   Problem: Activity: Goal: Risk for activity intolerance will decrease Outcome: Progressing   Problem: Nutrition: Goal: Adequate nutrition will be maintained Outcome: Progressing   Problem: Elimination: Goal: Will not experience complications related to bowel motility Outcome: Progressing Goal: Will not experience complications related to urinary retention Outcome: Progressing   Problem: Pain Managment: Goal: General experience of comfort will improve and/or be controlled Outcome: Progressing   Problem: Safety: Goal: Ability to remain free from injury will improve Outcome: Progressing   Problem: Skin Integrity: Goal: Risk for impaired skin integrity will decrease Outcome: Progressing   Problem: Education: Goal: Ability to verbalize understanding of medication therapies will improve Outcome: Progressing   Problem: Activity: Goal: Capacity to carry out activities will improve Outcome: Progressing   Problem: Cardiac: Goal: Ability to achieve and maintain adequate cardiopulmonary perfusion will improve Outcome: Progressing

## 2024-05-09 NOTE — Progress Notes (Signed)
-----------------------------------------------  Patient assessed by the [facility] [place]   Chart reviewed:Yes   Documentation gaps: none  Labs, test, and orders reviewed: yes  30-day Readmission: Yes  Discharge order: No EDD currently listed potentially for tomorrow 05/10/2024 pending pt. Clinical improvement and progression.  Current discharge plan: Home with family. Already established with [facility] PT/OT.  Barrier to discharge before 11am: none noted once patient medically ready for discharge.   Intervention provided by [facility] team: none   Barrier resolved: Yes   Chelbie Jarnagin, [employer]

## 2024-05-10 DIAGNOSIS — I82402 Acute embolism and thrombosis of unspecified deep veins of left lower extremity: Secondary | ICD-10-CM | POA: Diagnosis not present

## 2024-05-10 LAB — CBC
HCT: 31.8 % — ABNORMAL LOW (ref 39.0–52.0)
Hemoglobin: 9.9 g/dL — ABNORMAL LOW (ref 13.0–17.0)
MCH: 31.6 pg (ref 26.0–34.0)
MCHC: 31.1 g/dL (ref 30.0–36.0)
MCV: 101.6 fL — ABNORMAL HIGH (ref 80.0–100.0)
Platelets: 202 K/uL (ref 150–400)
RBC: 3.13 MIL/uL — ABNORMAL LOW (ref 4.22–5.81)
RDW: 21.2 % — ABNORMAL HIGH (ref 11.5–15.5)
WBC: 8.4 K/uL (ref 4.0–10.5)
nRBC: 0.2 % (ref 0.0–0.2)

## 2024-05-10 LAB — BASIC METABOLIC PANEL WITH GFR
Anion gap: 10 (ref 5–15)
BUN: 26 mg/dL — ABNORMAL HIGH (ref 8–23)
CO2: 29 mmol/L (ref 22–32)
Calcium: 8.6 mg/dL — ABNORMAL LOW (ref 8.9–10.3)
Chloride: 96 mmol/L — ABNORMAL LOW (ref 98–111)
Creatinine, Ser: 1.34 mg/dL — ABNORMAL HIGH (ref 0.61–1.24)
GFR, Estimated: 51 mL/min — ABNORMAL LOW (ref >60)
Glucose, Bld: 124 mg/dL — ABNORMAL HIGH (ref 70–99)
Potassium: 4 mmol/L (ref 3.5–5.1)
Sodium: 135 mmol/L (ref 135–145)

## 2024-05-10 MED ORDER — CARVEDILOL 3.125 MG PO TABS: 3.1250 mg | ORAL_TABLET | Freq: Two times a day (BID) | ORAL | Status: DC

## 2024-05-10 MED ORDER — CARVEDILOL 6.25 MG PO TABS
6.2500 mg | ORAL_TABLET | Freq: Two times a day (BID) | ORAL | Status: DC
Start: 2024-05-10 — End: 2024-05-11
  Administered 2024-05-10 – 2024-05-11 (×2): 6.25 mg via ORAL
  Filled 2024-05-10 (×2): qty 1

## 2024-05-10 NOTE — Progress Notes (Signed)
 PROGRESS NOTE    Jeffrey Stewart  FMW:[id number] DOB: 05-23-1935 DOA: 05/06/2024 PCP: Anita Bernardino [name], FNP  89/M w interstitial lung disease, diastolic CHF, CAD,  rheumatoid arthritis, RBBB, right hip fracture s/p ORIF 03/2024, followed by recent hospitalization for GI bleed and hemorrhagic shock who presented to [facility] ED for evaluation of bilateral lower extremity swelling.  - Recent long hospitalization at [facility] from 9/29-10/6 for hemorrhagic shock, acute blood loss anemia, required 5 units PRBC, pressors, EGD noted multiple nonbleeding gastric ulcers , eventually stabilized and discharged home . - Patient noticed lower extremity swelling during the hospitalization which progressively worsened then developed some purplish discoloration and ecchymosis in his lower legs, presented to the ED 10/21. -In the ER vital signs stable, BNP 360, troponin 37, WBC 10.9, hemoglobin 10.3, chest x-ray with cardiomegaly and small bilateral pleural effusions, lower extremity duplex ultrasound noted DVT in left peroneal vein   Subjective: - fells well, dyspnea better  Assessment and plan   Acute LLE DVT - Patient with a recent ORIF with intramedullary nailing rod on 9/22 followed by quick rehospitalization from 9/29 - 10/6  - LE U/S shows a DVT in the left peroneal vein - Now on Eliquis, recommend anticoagulation for 3-6 months total, monitor for recurrent bleeding   Acute on chronic diastolic HF - Last TTE on 08/2021 shows EF 50%, LV global hypokinesis, severe LVH, and G1DD - Likely worsened by her recent hospitalization for hemorrhagic shock, diffuse 5 units PRBC  - Improving with diuresis, 9.7 L negative, start torsemide  today, continue Aldactone, Jardiance  - Repeat echo with EF 45-50%, grade 1 DD, mildly reduced RV, moderately elevated PASP -  Unna boots -Increase activity, discharge planning, HR elevated add low dose Coreg    Normocytic anemia Recent upper GI bleed and hemorrhagic shock, gastric  ulcers - Hgb stable following recent discharge -Continue Protonix 40 mg twice daily - Continue iron supplementation   # HTN - BP stable with SBP in the 110-120s   # Rheumatoid arthritis - Continue CellCept  and prednisone    # HLD - Continue atorvastatin  and omega-3 fatty acid   # Hx of right hip fracture s/p ORIF # History of arthritis - Follow-up with orthopedic surgery in the outpatient - PT/OT eval and treat   DVT prophylaxis: Heparin  Code Status: Discussed CODE STATUS with the patient, now wishes for DNR Family Communication: none present today Disposition Plan: Home likely tomorrow  Consultants:    Procedures:   Antimicrobials:    Objective: Vitals:   05/10/24 0345 05/10/24 0727 05/10/24 0830 05/10/24 1204  BP: 104/60 118/61  114/67  Pulse: 97 100  (!) 104  Resp: (!) 24 19  (!) 26  Temp: 97.7 F (36.5 C) 97.9 F (36.6 C)  97.9 F (36.6 C)  TempSrc: Oral Oral  Oral  SpO2: 96% 95% 100% 100%  Weight: 86.5 kg     Height:        Intake/Output Summary (Last 24 hours) at 05/10/2024 1259 Last data filed at 05/10/2024 1000 Gross per 24 hour  Intake 360 ml  Output 3850 ml  Net -3490 ml   Filed Weights   05/08/24 0313 05/09/24 0446 05/10/24 0345  Weight: 90.4 kg 88.2 kg 86.5 kg    Examination:  General exam: Appears calm and comfortable, chronically ill-appearing Respiratory system: decreased breath sounds at the bases Cardiovascular system: S1 & S2 heard, RRR. tachy Abd: nondistended, soft and nontender.Normal bowel sounds heard. Central nervous system: Alert and oriented. No focal neurological  deficits. Extremities: trace edema, Unna boots off Skin: No rashes Psychiatry:  Mood & affect appropriate.     Data Reviewed:   CBC: Recent Labs  Lab 05/06/24 1224 05/07/24 0237 05/08/24 0224 05/09/24 0234 05/10/24 0316  WBC 10.9* 8.1 8.7 9.0 8.4  HGB 10.3* 9.0* 9.4* 9.4* 9.9*  HCT 33.1* 29.5* 30.3* 30.1* 31.8*  MCV 105.8* 106.1* 102.4* 101.7*  101.6*  PLT 148* 163 177 185 202   Basic Metabolic Panel: Recent Labs  Lab 05/06/24 1224 05/07/24 0237 05/08/24 0224 05/09/24 0234 05/10/24 0316  NA 141 139 137 135 135  K 4.0 3.9 3.7 3.7 4.0  CL 103 106 99 98 96*  CO2 26 23 28 27 29   GLUCOSE 125* 99 126* 125* 124*  BUN 28* 23 22 22  26*  CREATININE 1.00 1.01 1.25* 1.31* 1.34*  CALCIUM  9.4 8.2* 8.3* 8.2* 8.6*  MG  --  2.0  --   --   --    GFR: Estimated Creatinine Clearance: 42.6 mL/min (A) (by C-G formula based on SCr of 1.34 mg/dL (H)). Liver Function Tests: No results for input(s): AST, ALT, ALKPHOS, BILITOT, PROT, ALBUMIN  in the last 168 hours. No results for input(s): LIPASE, AMYLASE in the last 168 hours. No results for input(s): AMMONIA in the last 168 hours. Coagulation Profile: No results for input(s): INR, PROTIME in the last 168 hours. Cardiac Enzymes: No results for input(s): CKTOTAL, CKMB, CKMBINDEX, TROPONINI in the last 168 hours. BNP (last 3 results) Recent Labs    05/06/24 1224  PROBNP 360.0*   HbA1C: No results for input(s): HGBA1C in the last 72 hours. CBG: No results for input(s): GLUCAP in the last 168 hours. Lipid Profile: No results for input(s): CHOL, HDL, LDLCALC, TRIG, CHOLHDL, LDLDIRECT in the last 72 hours. Thyroid Function Tests: No results for input(s): TSH, T4TOTAL, FREET4, T3FREE, THYROIDAB in the last 72 hours. Anemia Panel: No results for input(s): VITAMINB12, FOLATE, FERRITIN, TIBC, IRON, RETICCTPCT in the last 72 hours. Urine analysis:    Component Value Date/Time   COLORURINE YELLOW 04/14/2024 2215   APPEARANCEUR HAZY (A) 04/14/2024 2215   LABSPEC 1.020 04/14/2024 2215   PHURINE 5.0 04/14/2024 2215   GLUCOSEU NEGATIVE 04/14/2024 2215   HGBUR NEGATIVE 04/14/2024 2215   BILIRUBINUR NEGATIVE 04/14/2024 2215   KETONESUR NEGATIVE 04/14/2024 2215   PROTEINUR NEGATIVE 04/14/2024 2215   UROBILINOGEN 1.0 09/24/2013  0905   NITRITE NEGATIVE 04/14/2024 2215   LEUKOCYTESUR NEGATIVE 04/14/2024 2215   Sepsis Labs: @LABRCNTIP (procalcitonin:4,lacticidven:4)  )No results found for this or any previous visit (from the past 240 hours).   Radiology Studies: No results found.    Scheduled Meds:  apixaban  10 mg Oral BID   Followed by   NOREEN ON 05/15/2024] apixaban  5 mg Oral BID   atorvastatin   40 mg Oral QHS   brimonidine   1 drop Both Eyes BID   carvedilol   3.125 mg Oral BID WC   cholecalciferol   1,000 Units Oral Daily   empagliflozin  10 mg Oral Daily   ferrous sulfate  325 mg Oral Q breakfast   fluticasone  furoate-vilanterol  1 puff Inhalation Daily   Gerhardt's butt cream   Topical BID   latanoprost   1 drop Both Eyes QHS   loratadine   10 mg Oral Daily   mycophenolate   1,000 mg Oral BID   omega-3 acid ethyl esters  1 g Oral BID   pantoprazole  40 mg Oral BID   potassium chloride  SA  40 mEq Oral Daily  predniSONE   1 mg Oral QHS   predniSONE   5 mg Oral Q breakfast   spironolactone  25 mg Oral Daily   torsemide   40 mg Oral Daily   Continuous Infusions:     LOS: 4 days    Time spent:    Sigurd Pac, MD [facility]   05/10/2024, 12:59 PM

## 2024-05-10 NOTE — Plan of Care (Signed)

## 2024-05-11 ENCOUNTER — Other Ambulatory Visit (HOSPITAL_COMMUNITY): Payer: Self-pay

## 2024-05-11 DIAGNOSIS — I82402 Acute embolism and thrombosis of unspecified deep veins of left lower extremity: Secondary | ICD-10-CM | POA: Diagnosis not present

## 2024-05-11 LAB — CBC
HCT: 33.3 % — ABNORMAL LOW (ref 39.0–52.0)
Hemoglobin: 10.6 g/dL — ABNORMAL LOW (ref 13.0–17.0)
MCH: 32 pg (ref 26.0–34.0)
MCHC: 31.8 g/dL (ref 30.0–36.0)
MCV: 100.6 fL — ABNORMAL HIGH (ref 80.0–100.0)
Platelets: 242 K/uL (ref 150–400)
RBC: 3.31 MIL/uL — ABNORMAL LOW (ref 4.22–5.81)
RDW: 21.2 % — ABNORMAL HIGH (ref 11.5–15.5)
WBC: 12.6 K/uL — ABNORMAL HIGH (ref 4.0–10.5)
nRBC: 0 % (ref 0.0–0.2)

## 2024-05-11 LAB — BASIC METABOLIC PANEL WITH GFR
Anion gap: 12 (ref 5–15)
BUN: 37 mg/dL — ABNORMAL HIGH (ref 8–23)
CO2: 27 mmol/L (ref 22–32)
Calcium: 8.7 mg/dL — ABNORMAL LOW (ref 8.9–10.3)
Chloride: 97 mmol/L — ABNORMAL LOW (ref 98–111)
Creatinine, Ser: 1.42 mg/dL — ABNORMAL HIGH (ref 0.61–1.24)
GFR, Estimated: 48 mL/min — ABNORMAL LOW (ref >60)
Glucose, Bld: 133 mg/dL — ABNORMAL HIGH (ref 70–99)
Potassium: 4.7 mmol/L (ref 3.5–5.1)
Sodium: 136 mmol/L (ref 135–145)

## 2024-05-11 MED ORDER — SPIRONOLACTONE 25 MG PO TABS
25.0000 mg | ORAL_TABLET | Freq: Every day | ORAL | 0 refills | Status: DC
  Filled 2024-05-11: qty 60, 60d supply, fill #0

## 2024-05-11 MED ORDER — EMPAGLIFLOZIN 10 MG PO TABS
10.0000 mg | ORAL_TABLET | Freq: Every day | ORAL | 0 refills | Status: AC
  Filled 2024-05-11: qty 30, 30d supply, fill #0

## 2024-05-11 MED ORDER — APIXABAN 5 MG PO TABS
ORAL_TABLET | ORAL | 0 refills | Status: AC
  Filled 2024-05-11: qty 64, 30d supply, fill #0

## 2024-05-11 MED ORDER — CARVEDILOL 6.25 MG PO TABS
6.2500 mg | ORAL_TABLET | Freq: Two times a day (BID) | ORAL | 0 refills | Status: DC
  Filled 2024-05-11: qty 60, 30d supply, fill #0

## 2024-05-11 MED ORDER — TORSEMIDE 20 MG PO TABS
40.0000 mg | ORAL_TABLET | Freq: Every day | ORAL | 0 refills | Status: AC
  Filled 2024-05-11: qty 120, 60d supply, fill #0

## 2024-05-11 NOTE — Care Management (Signed)
 Notified [facility] that patient will DC today

## 2024-05-11 NOTE — Discharge Summary (Addendum)
 Physician Discharge Summary  Jeffrey Stewart FMW:[id number] DOB: [birth year] DOA: 05/06/2024  PCP: Jeffrey Bernardino [name], FNP  Admit date: 05/06/2024 Discharge date: 05/11/2024  Time spent: 45 minutes  Recommendations for Outpatient Follow-up:  [facility] in 2 to 3 weeks PCP in 1 week, please check BMP in 1 week, review all imaging studies from this admission   Discharge Diagnoses:  Acute on chronic diastolic CHF   Leg DVT (deep venous thromboembolism), acute, left (HCC)   Normocytic anemia   Acute on chronic diastolic CHF (congestive heart failure) (HCC)   Essential hypertension   Multiple gastric ulcers   History of fracture of right hip   Discharge Condition: Improved  Diet recommendation: Low-sodium  Filed Weights   05/09/24 0446 05/10/24 0345 05/11/24 0400  Weight: 88.2 kg 86.5 kg 84.4 kg    History of present illness:  89/M w interstitial lung disease, diastolic CHF, CAD,  rheumatoid arthritis, RBBB, right hip fracture s/p ORIF 03/2024, followed by recent hospitalization for GI bleed and hemorrhagic shock who presented to [facility] for evaluation of bilateral lower extremity swelling.  - Recent long hospitalization at [facility] from 9/29-10/6 for hemorrhagic shock, acute blood loss anemia, required 5 units PRBC, pressors, EGD noted multiple nonbleeding gastric ulcers , eventually stabilized and discharged home . - Patient noticed lower extremity swelling during the hospitalization which progressively worsened then developed some purplish discoloration and ecchymosis in his lower legs, presented to the ED 10/21. -In the ER vital signs stable, BNP 360, troponin 37, WBC 10.9, hemoglobin 10.3, chest x-ray with cardiomegaly and small bilateral pleural effusions, lower extremity duplex ultrasound noted DVT in left peroneal vein    Hospital Course:   Acute LLE DVT - Patient with a recent ORIF with intramedullary nailing rod on 9/22 followed by quick rehospitalization from 9/29 -  10/6  - LE U/S shows a DVT in the left peroneal vein - Now on Eliquis, recommend anticoagulation for 3-6 months total, monitor for recurrent bleeding   Acute on chronic diastolic HF - Last TTE on 08/2021 shows EF 50%, LV global hypokinesis, severe LVH, and G1DD - Likely worsened by her recent hospitalization for hemorrhagic shock, diffuse 5 units PRBC  - Improving with diuresis, 10.8 L negative, switch to oral torsemide , started on Aldactone and Jardiance  - Repeat echo with EF 45-50%, grade 1 DD, mildly reduced RV, moderately elevated PASP - Also started on low-dose carvedilol , discharged home in stable condition, -Follow-up with [facility]   Normocytic anemia Recent upper GI bleed and hemorrhagic shock, gastric ulcers - Hgb stable following recent discharge -Continue Protonix 40 mg twice daily - Continue iron supplementation   # HTN - BP stable    # Rheumatoid arthritis - Continue CellCept  and prednisone    # HLD - Continue atorvastatin  and omega-3 fatty acid   # Hx of right hip fracture s/p ORIF # History of arthritis - Follow-up with orthopedic surgery in the outpatient - Continue home PT  Discharge Exam: Vitals:   05/11/24 0400 05/11/24 0717  BP: (!) 103/55 (!) 106/54  Pulse: 97 91  Resp: 20 17  Temp: 98 F (36.7 C) 97.8 F (36.6 C)  SpO2: 95% 99%   General exam: Appears calm and comfortable, chronically ill-appearing Respiratory system: decreased breath sounds at the bases Cardiovascular system: S1 & S2 heard, RRR. tachy Abd: nondistended, soft and nontender.Normal bowel sounds heard. Central nervous system: Alert and oriented. No focal neurological deficits. Extremities: trace edema, Unna boots off Skin: No  rashes Psychiatry:  Mood & affect appropriate.     Discharge Instructions   Discharge Instructions     Diet - low sodium heart healthy   Complete by: As directed    Discharge wound care:   Complete by: As directed    routine   Increase  activity slowly   Complete by: As directed       Allergies as of 05/11/2024       Reactions   Niaspan  [niacin ] Other (See Comments)   Intolerance flush feeling when on large dose - is able to take smaller dosage without problem    Meloxicam Other (See Comments)   Disorientation         Medication List     STOP taking these medications    sucralfate 1 GM/10ML suspension Commonly known as: CARAFATE       TAKE these medications    albuterol  108 (90 Base) MCG/ACT inhaler Commonly known as: VENTOLIN  HFA Inhale 2 puffs into the lungs every 4 (four) hours as needed for wheezing or shortness of breath.   atorvastatin  40 MG tablet Commonly known as: LIPITOR TAKE 1 TABLET AT BEDTIME   brimonidine  0.2 % ophthalmic solution Commonly known as: ALPHAGAN  Place 1 drop into both eyes 2 (two) times daily.   carvedilol  6.25 MG tablet Commonly known as: COREG  Take 1 tablet (6.25 mg total) by mouth 2 (two) times daily with a meal.   cetirizine 10 MG tablet Commonly known as: ZYRTEC Take 10 mg by mouth at bedtime.   Eliquis 5 MG Tabs tablet Generic drug: apixaban Take 2 tablets (10 mg total) by mouth 2 (two) times daily for 2 days, THEN 1 tablet (5 mg total) 2 (two) times daily. Start taking on: May 11, 2024   FeroSul 325 (65 Fe) MG tablet Generic drug: ferrous sulfate Take 1 tablet (325 mg total) by mouth daily with breakfast.   fish oil-omega-3 fatty acids  1000 MG capsule Take 1 g by mouth 2 (two) times daily.   fluticasone  50 MCG/ACT nasal spray Commonly known as: FLONASE  Place 1-2 sprays into both nostrils daily as needed for allergies or rhinitis.   fluticasone -salmeterol 100-50 MCG/ACT Aepb Commonly known as: ADVAIR Inhale 1 puff into the lungs 2 (two) times daily.   glucosamine-chondroitin 500-400 MG tablet Take 1 tablet by mouth at bedtime.   ipratropium-albuterol  0.5-2.5 (3) MG/3ML Soln Commonly known as: DUONEB Take 3 mLs by nebulization every 6 (six)  hours as needed (wheezing, shortness of breath).   Jardiance 10 MG Tabs tablet Generic drug: empagliflozin Take 1 tablet (10 mg total) by mouth daily.   latanoprost  0.005 % ophthalmic solution Commonly known as: XALATAN  Place 1 drop into both eyes at bedtime.   loperamide 2 MG capsule Commonly known as: IMODIUM Take 2 mg by mouth 2 (two) times daily as needed for diarrhea or loose stools.   metroNIDAZOLE  0.75 % cream Commonly known as: METROCREAM  Apply 1 application  topically 2 (two) times daily as needed (for rosacea).   mycophenolate  500 MG tablet Commonly known as: CELLCEPT  TAKE 1 TABLET BY MOUTH TWICE A DAY What changed: how much to take   NEOMYCIN -POLYMYXIN-HYDROCORTISONE  1 % Soln OTIC solution Commonly known as: CORTISPORIN Place 1 drop into both ears as needed (when ears are stopped up).   pantoprazole 40 MG tablet Commonly known as: PROTONIX Take 1 tablet (40 mg total) by mouth 2 (two) times daily.   potassium chloride  SA 20 MEQ tablet Commonly known as: KLOR-CON  M Take 1  tablet (20 mEq total) by mouth daily.   predniSONE  1 MG tablet Commonly known as: DELTASONE  Take 1 tablet (1 mg total) by mouth at bedtime. Restart after finished with prednisone  taper   predniSONE  5 MG tablet Commonly known as: DELTASONE  Take 1 tablet (5 mg total) by mouth daily with breakfast. Restart after finished with prednisone  taper   sodium chloride  0.65 % nasal spray Commonly known as: OCEAN Place 1 spray into the nose as needed for congestion.   spironolactone 25 MG tablet Commonly known as: ALDACTONE Take 1 tablet (25 mg total) by mouth daily.   torsemide  20 MG tablet Commonly known as: DEMADEX  Take 2 tablets (40 mg total) by mouth daily. What changed: how much to take   Vitamin D3 1000 units Caps Take 1,000 Units by mouth daily.               Discharge Care Instructions  (From admission, onward)           Start     Ordered   05/11/24 0000  Discharge  wound care:       Comments: routine   05/11/24 0928           Allergies  Allergen Reactions   Niaspan  [Niacin ] Other (See Comments)    Intolerance flush feeling when on large dose - is able to take smaller dosage without problem    Meloxicam Other (See Comments)    Disorientation     Follow-up Information     Jeffrey Bernardino [name], FNP Follow up.   Specialty: Nurse Practitioner Contact information: [address] [place] [address] [phone]         [facility] Follow up.   Specialty: Home Health Services Why: Agency will call you to set up apt times Contact information: [address] [place]  [phone]                 The results of significant diagnostics from this hospitalization (including imaging, microbiology, ancillary and laboratory) are listed below for reference.    Significant Diagnostic Studies: ECHOCARDIOGRAM COMPLETE Result Date: 05/07/2024    ECHOCARDIOGRAM REPORT   Patient Name:   Jeffrey Stewart Date of Exam: 05/07/2024 Medical Rec #:  [id number]      Height:       72.0 in Accession #:    [id number]     Weight:       198.6 lb Date of Birth:  08-26-1934       BSA:          2.124 m Patient Age:    89 years       BP:           118/54 mmHg Patient Gender: M              HR:           90 bpm. Exam Location:  Inpatient Procedure: 2D Echo, Cardiac Doppler and Color Doppler (Both Spectral and Color            Flow Doppler were utilized during procedure). Indications:    CHF  History:        Patient has prior history of Echocardiogram examinations, most                 recent 08/25/2021. CHF, CAD, Arrythmias:RBBB,                 Signs/Symptoms:Shortness of Breath  and Edema; Risk                 Factors:Hypertension and Dyslipidemia.  Sonographer:    Juliene Rucks Referring Phys: [id number] PROSPER M AMPONSAH IMPRESSIONS  1. Left ventricular ejection fraction, by estimation, is 45 to  50%. The left ventricle has mildly decreased function. The left ventricle demonstrates global hypokinesis. There is moderate asymmetric left ventricular hypertrophy of the basal-septal segment. Left ventricular diastolic parameters are consistent with Grade I diastolic dysfunction (impaired relaxation).  2. Right ventricular systolic function is mildly reduced. The right ventricular size is mildly enlarged. There is moderately elevated pulmonary artery systolic pressure. The estimated right ventricular systolic pressure is 45.5 mmHg.  3. The mitral valve is normal in structure. Trivial mitral valve regurgitation. No evidence of mitral stenosis.  4. The aortic valve is grossly normal. Aortic valve regurgitation is mild to moderate. No aortic stenosis is present.  5. Aortic dilatation noted. There is mild dilatation of the aortic root, measuring 41 mm.  6. The inferior vena cava is dilated in size with <50% respiratory variability, suggesting right atrial pressure of 15 mmHg. Comparison(s): No significant change from prior study. FINDINGS  Left Ventricle: Left ventricular ejection fraction, by estimation, is 45 to 50%. The left ventricle has mildly decreased function. The left ventricle demonstrates global hypokinesis. The left ventricular internal cavity size was normal in size. There is  moderate asymmetric left ventricular hypertrophy of the basal-septal segment. Left ventricular diastolic parameters are consistent with Grade I diastolic dysfunction (impaired relaxation). Right Ventricle: The right ventricular size is mildly enlarged. Right vetricular wall thickness was not well visualized. Right ventricular systolic function is mildly reduced. There is moderately elevated pulmonary artery systolic pressure. The tricuspid  regurgitant velocity is 2.76 m/s, and with an assumed right atrial pressure of 15 mmHg, the estimated right ventricular systolic pressure is 45.5 mmHg. Left Atrium: Left atrial size was normal in  size. Right Atrium: Right atrial size was normal in size. Pericardium: There is no evidence of pericardial effusion. Presence of epicardial fat layer. Mitral Valve: The mitral valve is normal in structure. Trivial mitral valve regurgitation. No evidence of mitral valve stenosis. Tricuspid Valve: The tricuspid valve is normal in structure. Tricuspid valve regurgitation is mild . No evidence of tricuspid stenosis. Aortic Valve: The aortic valve is grossly normal. Aortic valve regurgitation is mild to moderate. Aortic regurgitation PHT measures 298 msec. No aortic stenosis is present. Aortic valve mean gradient measures 5.0 mmHg. Aortic valve peak gradient measures  8.8 mmHg. Aortic valve area, by VTI measures 3.73 cm. Pulmonic Valve: The pulmonic valve was not well visualized. Pulmonic valve regurgitation is not visualized. No evidence of pulmonic stenosis. Aorta: Aortic dilatation noted. There is mild dilatation of the aortic root, measuring 41 mm. Venous: The inferior vena cava is dilated in size with less than 50% respiratory variability, suggesting right atrial pressure of 15 mmHg. IAS/Shunts: The atrial septum is grossly normal.  LEFT VENTRICLE PLAX 2D LVIDd:         5.60 cm   Diastology LVIDs:         4.00 cm   LV e' medial:    5.87 cm/s LV PW:         1.10 cm   LV E/e' medial:  8.7 LV IVS:        1.50 cm   LV e' lateral:   9.68 cm/s LVOT diam:     2.40 cm   LV E/e' lateral: 5.2 LV  SV:         95 LV SV Index:   45 LVOT Area:     4.52 cm  RIGHT VENTRICLE            IVC RV Basal diam:  4.10 cm    IVC diam: 2.30 cm RV Mid diam:    3.75 cm RV S prime:     8.27 cm/s LEFT ATRIUM             Index        RIGHT ATRIUM           Index LA diam:        3.90 cm 1.84 cm/m   RA Area:     16.50 cm LA Vol (A2C):   49.5 ml 23.30 ml/m  RA Volume:   46.50 ml  21.89 ml/m LA Vol (A4C):   43.8 ml 20.62 ml/m LA Biplane Vol: 49.1 ml 23.11 ml/m  AORTIC VALVE AV Area (Vmax):    3.64 cm AV Area (Vmean):   3.46 cm AV Area (VTI):      3.73 cm AV Vmax:           148.00 cm/s AV Vmean:          105.000 cm/s AV VTI:            0.256 m AV Peak Grad:      8.8 mmHg AV Mean Grad:      5.0 mmHg LVOT Vmax:         119.00 cm/s LVOT Vmean:        80.200 cm/s LVOT VTI:          0.211 m LVOT/AV VTI ratio: 0.82 AI PHT:            298 msec  AORTA Ao Root diam: 4.10 cm Ao Asc diam:  3.30 cm MITRAL VALVE               TRICUSPID VALVE MV Area (PHT): 4.15 cm    TR Peak grad:   30.5 mmHg MV Decel Time: 183 msec    TR Vmax:        276.00 cm/s MR Peak grad: 53.3 mmHg MR Vmax:      365.00 cm/s  SHUNTS MV E velocity: 50.80 cm/s  Systemic VTI:  0.21 m MV A velocity: 88.30 cm/s  Systemic Diam: 2.40 cm MV E/A ratio:  0.58 Shelda Bruckner MD Electronically signed by Shelda Bruckner MD Signature Date/Time: 05/07/2024/8:35:23 PM    Final    US  Venous Img Lower Bilateral Result Date: 05/06/2024 CLINICAL DATA:  Bilateral lower extremity edema. EXAM: BILATERAL LOWER EXTREMITY VENOUS DOPPLER ULTRASOUND TECHNIQUE: Gray-scale sonography with graded compression, as well as color Doppler and duplex ultrasound were performed to evaluate the lower extremity deep venous systems from the level of the common femoral vein and including the common femoral, femoral, profunda femoral, popliteal and calf veins including the posterior tibial, peroneal and gastrocnemius veins when visible. The superficial great saphenous vein was also interrogated. Spectral Doppler was utilized to evaluate flow at rest and with distal augmentation maneuvers in the common femoral, femoral and popliteal veins. COMPARISON:  None Available. FINDINGS: RIGHT LOWER EXTREMITY Common Femoral Vein: No evidence of thrombus. Normal compressibility, respiratory phasicity and response to augmentation. Saphenofemoral Junction: No evidence of thrombus. Normal compressibility and flow on color Doppler imaging. Profunda Femoral Vein: No evidence of thrombus. Normal compressibility and flow on color Doppler  imaging. Femoral Vein: No evidence of thrombus. Normal  compressibility, respiratory phasicity and response to augmentation. Popliteal Vein: No evidence of thrombus. Normal compressibility, respiratory phasicity and response to augmentation. Calf Veins: No evidence of thrombus. Normal compressibility and flow on color Doppler imaging. Superficial Great Saphenous Vein: No evidence of thrombus. Normal compressibility. Venous Reflux:  None. Other Findings: No evidence of superficial thrombophlebitis or abnormal fluid collection. LEFT LOWER EXTREMITY Common Femoral Vein: No evidence of thrombus. Normal compressibility, respiratory phasicity and response to augmentation. Saphenofemoral Junction: No evidence of thrombus. Normal compressibility and flow on color Doppler imaging. Profunda Femoral Vein: No evidence of thrombus. Normal compressibility and flow on color Doppler imaging. Femoral Vein: No evidence of thrombus. Normal compressibility, respiratory phasicity and response to augmentation. Popliteal Vein: No evidence of thrombus. Normal compressibility, respiratory phasicity and response to augmentation. Calf Veins: Thrombus in the visualized left peroneal vein. The posterior tibial vein appears normally patent. Superficial Great Saphenous Vein: No evidence of thrombus. Normal compressibility. Venous Reflux:  None. Other Findings: No evidence of superficial thrombophlebitis or abnormal fluid collection. IMPRESSION: 1. Deep vein thrombosis in the left peroneal vein. 2. No evidence of deep vein thrombosis in the right lower extremity. Electronically Signed   By: Marcey Moan M.D.   On: 05/06/2024 14:11   DG Chest 2 View Result Date: 05/06/2024 EXAM: 2 VIEW(S) XRAY OF THE CHEST 05/06/2024 11:55:00 AM COMPARISON: 04/15/2024. Chest CT of 04/16/2024. CLINICAL HISTORY: SHOB. Table formatting from the original note was not included.; Images from the original note were not included.; Reports bilateral leg swelling and  redness. Hx of cellulitis. Recently in ICU for similar swelling. FINDINGS: LUNGS AND PLEURA: Hyperinflation. Right upper lobe pulmonary nodule was detailed on prior chest CT. Left base scarring and/or subsegmental atelectasis. Tiny bilateral pleural effusions. No pulmonary edema. No pneumothorax. HEART AND MEDIASTINUM: Mild cardiomegaly. Transverse aortic atherosclerosis. BONES AND SOFT TISSUES: No acute osseous abnormality. IMPRESSION: 1. Cardiomegaly with small bilateral pleural effusions and left base atelectasis. 2. . Right upper lobe pulmonary nodule, as on prior CT. Electronically signed by: Rockey Kilts MD 05/06/2024 01:32 PM EDT RP Workstation: [id number]   XR HIP UNILAT W OR W/O PELVIS 2-3 VIEWS RIGHT Result Date: 04/24/2024 X-rays of the right hip from 04/24/2024 were independently reviewed and interpreted, showing a short cephalomedullary rod in place.  No lucency around the interlocking or lag screws.  A nondisplaced intertrochanteric femur fracture is seen.  No new fracture seen.  No dislocation seen.  CT ANGIO GI BLEED Result Date: 04/17/2024 CLINICAL DATA:  persistent hematochezia, blood loss anemia EXAM: CTA ABDOMEN AND PELVIS WITHOUT AND WITH CONTRAST TECHNIQUE: Initially, noncontrast CT of the abdomen and pelvis were performed. Subsequently, Multidetector CT imaging of the abdomen and pelvis was performed using the standard protocol during bolus administration of intravenous contrast. Multiplanar reconstructed images and MIPs were obtained and reviewed to evaluate the vascular anatomy. RADIATION DOSE REDUCTION: This exam was performed according to the departmental dose-optimization program which includes automated exposure control, adjustment of the mA and/or kV according to patient size and/or use of iterative reconstruction technique. CONTRAST:  80mL OMNIPAQUE  IOHEXOL  350 MG/ML SOLN COMPARISON:  04/14/2024 FINDINGS: VASCULAR Aorta: No aortic aneurysm or dissection. Diffuse aortic  atherosclerosis. No hemodynamically significant stenosis. Celiac: Patent without acute thrombus, aneurysm, or dissection.No hemodynamically significant stenosis. SMA: Patent without acute thrombus, aneurysm, or dissection.No hemodynamically significant stenosis. Renals: Patent without acute thrombus, aneurysm, or dissection.Mild stenosis of both renal artery ostia from calcified plaque. IMA: Patent without acute thrombus, aneurysm, or dissection.Moderate narrowing of the IMA ostium  from calcified plaque. Inflow: Patent without acute thrombus, aneurysm, or dissection.Diffuse calcified atherosclerosis throughout the inflow vessels without hemodynamically significant stenosis. Proximal Outflow: The bilateral common femoral and visualized portions of the superficial and profunda femoral arteries are patent without acute thrombus, aneurysm, or dissection.No hemodynamically significant stenosis. Veins: No obvious venous abnormality within the limitations of this arterial phase study. Review of the MIP images confirms the above findings. NON-VASCULAR Lower chest: No focal airspace consolidation or pleural effusion.Posterior bibasilar dependent atelectasis. Multifocal scarring also present in the lung bases. Hepatobiliary: No mass.No radiopaque stones or wall thickening of the gallbladder.No intrahepatic or extrahepatic biliary ductal dilation. Pancreas: No mass or main ductal dilation.No peripancreatic inflammation or fluid collection. Spleen: Normal size. No mass. Adrenals/Urinary Tract: No adrenal masses. No renal mass. No hydronephrosis or nephrolithiasis. The urinary bladder is distended without focal abnormality. Stomach/Bowel: The stomach is decompressed without focal abnormality. No small bowel wall thickening or inflammation. No small bowel obstruction.The appendix was not visualized. No right lower quadrant or pericecal inflammatory changes to suggest acute appendicitis. Hyperdense enteric material or contrast  within the cecum and ascending colon. Scattered colonic diverticulosis. GI Bleed: No extravasation of contrast to suggest active GI bleeding. Lymphatic: No intraabdominal or pelvic lymphadenopathy. Reproductive: No prostatomegaly.No free pelvic fluid. Other: No pneumoperitoneum, ascites, or mesenteric inflammation. Bilateral inguinal hernia repairs. Musculoskeletal: No acute fracture or destructive lesion.Partially visualized right femoral intramedullary nail with persistent fracture of the right intertrochanteric femoral neck. Likely postsurgical edema along the right hip. Multilevel degenerative disc disease of the spine. IMPRESSION: VASCULAR 1. No aortic aneurysm, intramural hematoma, or aortic dissection. 2. Hyperdense enteric material or contrast within the cecum and ascending colon, which limits evaluation for GI bleed in this region. Otherwise, no extravasation of contrast in the remainder of the GI tract to suggest active GI bleeding. NON-VASCULAR No additional, acute findings within the abdomen or pelvis. Aortic Atherosclerosis (ICD10-I70.0). Electronically Signed   By: Rogelia Myers M.D.   On: 04/17/2024 09:58   CT CHEST WO CONTRAST Result Date: 04/16/2024 CLINICAL DATA:  Respiratory illness, nondiagnostic xray RUL opacity on CXR. EXAM: CT CHEST WITHOUT CONTRAST TECHNIQUE: Multidetector CT imaging of the chest was performed following the standard protocol without IV contrast. RADIATION DOSE REDUCTION: This exam was performed according to the departmental dose-optimization program which includes automated exposure control, adjustment of the mA and/or kV according to patient size and/or use of iterative reconstruction technique. COMPARISON:  Chest radiograph from 04/15/2024. FINDINGS: Cardiovascular: Normal cardiac size. No pericardial effusion. No aortic aneurysm. There are coronary artery calcifications, in keeping with coronary artery disease. There are also moderate peripheral atherosclerotic  vascular calcifications of thoracic aorta and its major branches. Aberrant origin of right subclavian artery noted, which arises distal to the left subclavian artery and courses towards the right side, posterior to the esophagus. Mediastinum/Nodes: Visualized thyroid gland appears grossly unremarkable. No solid / cystic mediastinal masses. The esophagus is nondistended precluding optimal assessment. There are few mildly prominent mediastinal lymph nodes, which do not meet the size criteria for lymphadenopathy and appear grossly similar to the prior study, favoring benign etiology. No axillary lymphadenopathy by size criteria. Evaluation of bilateral hila is limited due to lack on intravenous contrast: however, no large hilar lymphadenopathy identified. Lungs/Pleura: The central tracheo-bronchial tree is patent. There is a new irregular solid noncalcified 1.0 x 2.2 cm opacity in the subpleural right upper lobe. There are several additional linear areas of scarring/atelectasis throughout bilateral lungs, which are grossly similar to the prior study.  No lung collapse, pleural effusion or pneumothorax. No pulmonary edema. There is a 5 x 5 mm ground-glass nodule in the left lung lower lobe, which is also new since the prior study. Upper Abdomen: There is small sliding hiatal hernia. Visualized upper abdominal viscera within normal limits. Musculoskeletal: Right-sided PICC line noted with its tip in the cavo-atrial junction region. Visualized soft tissues of the chest wall are otherwise grossly unremarkable. No suspicious osseous lesions. There are mild multilevel degenerative changes in the visualized spine. IMPRESSION: 1. There is a new irregular solid noncalcified 1.0 x 2.2 cm opacity in the subpleural right upper lobe. This opacity corresponds to the density described on the recent chest radiograph. This is suspicious. Pulmonary consultation is recommended. 2. There is also a new 5 x 5 mm ground-glass nodule in the  left lung lower lobe. This is also indeterminate and differential diagnosis includes infectious/inflammatory etiology versus neoplastic process. 3. Multiple other nonacute observations, as described above. Aortic Atherosclerosis (ICD10-I70.0). Electronically Signed   By: Ree Molt M.D.   On: 04/16/2024 16:44   US  RENAL Result Date: 04/16/2024 CLINICAL DATA:  Acute kidney injury. EXAM: RENAL / URINARY TRACT ULTRASOUND COMPLETE COMPARISON:  None Available. FINDINGS: Right Kidney: Renal measurements: 10.9 x 5.2 x 5.3 cm = volume: 156 mL. Increased cortical echogenicity. Mild cortical atrophy. No hydronephrosis. Left Kidney: Renal measurements: 13.3 x 5.9 x 5.5 cm = volume: 225 mL. Mildly increased cortical echogenicity. No hydronephrosis. Bladder: Appears normal for degree of bladder distention. Other: None. IMPRESSION: Increased cortical echogenicity of both kidneys consistent with chronic kidney disease. No evidence of hydronephrosis. Electronically Signed   By: Marcey Moan M.D.   On: 04/16/2024 14:09   DG Chest Portable 1 View Result Date: 04/15/2024 CLINICAL DATA:  PICC placement. EXAM: PORTABLE CHEST 1 VIEW COMPARISON:  April 14, 2024. FINDINGS: Stable cardiomediastinal silhouette. Right-sided PICC line is noted with distal tip in expected position of the SVC. Minimal bibasilar subsegmental atelectasis or scarring is noted with probable left basilar pleural thickening. New irregular nodular density seen in right upper lobe; CT scan of the chest is recommended for further evaluation. IMPRESSION: 1. Right-sided PICC line is noted with distal tip in expected position of the SVC. 2. New irregular nodular density seen in right upper lobe; CT scan of the chest is recommended for further evaluation. Electronically Signed   By: Lynwood Landy Raddle M.D.   On: 04/15/2024 17:10   US  EKG SITE RITE Result Date: 04/15/2024 If Site Rite image not attached, placement could not be confirmed due to current cardiac  rhythm.  DG Chest Port 1 View Result Date: 04/14/2024 CLINICAL DATA:  Questionable sepsis. EXAM: PORTABLE CHEST 1 VIEW COMPARISON:  Chest radiograph dated 04/06/2024 FINDINGS: Shallow inspiration with bibasilar atelectasis. No consolidative changes. No pleural effusion pneumothorax. Stable cardiac silhouette. No acute osseous pathology. IMPRESSION: Shallow inspiration with bibasilar atelectasis. No focal consolidation. Electronically Signed   By: Vanetta Chou M.D.   On: 04/14/2024 17:30   CT ABDOMEN PELVIS WO CONTRAST Result Date: 04/14/2024 CLINICAL DATA:  Abdominal pain.  Hematemesis. EXAM: CT ABDOMEN AND PELVIS WITHOUT CONTRAST TECHNIQUE: Multidetector CT imaging of the abdomen and pelvis was performed following the standard protocol without IV contrast. RADIATION DOSE REDUCTION: This exam was performed according to the departmental dose-optimization program which includes automated exposure control, adjustment of the mA and/or kV according to patient size and/or use of iterative reconstruction technique. COMPARISON:  Abdominal ultrasound dated 11/04/2021. FINDINGS: Evaluation of this exam is limited in  the absence of intravenous contrast. Lower chest: Bibasilar subpleural atelectasis/scarring. There is coronary vascular calcification. No intra-abdominal free air or free fluid. Hepatobiliary: The liver is unremarkable. No biliary dilatation. The gallbladder is unremarkable. Pancreas: Unremarkable. No pancreatic ductal dilatation or surrounding inflammatory changes. Spleen: Normal in size without focal abnormality. Adrenals/Urinary Tract: The adrenal glands unremarkable. There is no hydronephrosis or nephrolithiasis on either side. Bilateral perinephric stranding, nonspecific. Correlation with urinalysis recommended to exclude UTI. The visualized ureters and urinary bladder appear unremarkable. Stomach/Bowel: There is loose stool throughout the colon suggestive of diarrheal state. Correlation with  clinical exam and stool cultures recommended. There is no bowel obstruction or active inflammation. Appendectomy. Vascular/Lymphatic: Moderate aortoiliac atherosclerotic disease. The IVC is unremarkable. No portal venous gas. There is no adenopathy. Reproductive: The prostate and seminal vesicles are grossly unremarkable. Other: Small fat containing bilateral inguinal hernias. Anterior pelvic wall hernia repair mesh. Musculoskeletal: Osteopenia with degenerative changes of the spine. Status post ORIF of right femoral neck fracture. There is edema in the surrounding soft tissues of the right hip. No fluid collection. IMPRESSION: 1. Diarrheal state. Correlation with clinical exam and stool cultures recommended. No bowel obstruction. 2. No hydronephrosis or nephrolithiasis. 3.  Aortic Atherosclerosis (ICD10-I70.0). Electronically Signed   By: Vanetta Chou M.D.   On: 04/14/2024 17:29    Microbiology: No results found for this or any previous visit (from the past 240 hours).   Labs: Basic Metabolic Panel: Recent Labs  Lab 05/07/24 0237 05/08/24 0224 05/09/24 0234 05/10/24 0316 05/11/24 0155  NA 139 137 135 135 136  K 3.9 3.7 3.7 4.0 4.7  CL 106 99 98 96* 97*  CO2 23 28 27 29 27   GLUCOSE 99 126* 125* 124* 133*  BUN 23 22 22  26* 37*  CREATININE 1.01 1.25* 1.31* 1.34* 1.42*  CALCIUM  8.2* 8.3* 8.2* 8.6* 8.7*  MG 2.0  --   --   --   --    Liver Function Tests: No results for input(s): AST, ALT, ALKPHOS, BILITOT, PROT, ALBUMIN  in the last 168 hours. No results for input(s): LIPASE, AMYLASE in the last 168 hours. No results for input(s): AMMONIA in the last 168 hours. CBC: Recent Labs  Lab 05/07/24 0237 05/08/24 0224 05/09/24 0234 05/10/24 0316 05/11/24 0155  WBC 8.1 8.7 9.0 8.4 12.6*  HGB 9.0* 9.4* 9.4* 9.9* 10.6*  HCT 29.5* 30.3* 30.1* 31.8* 33.3*  MCV 106.1* 102.4* 101.7* 101.6* 100.6*  PLT 163 177 185 202 242   Cardiac Enzymes: No results for input(s):  CKTOTAL, CKMB, CKMBINDEX, TROPONINI in the last 168 hours. BNP: BNP (last 3 results) Recent Labs    09/08/23 0733  BNP 77.7    ProBNP (last 3 results) Recent Labs    05/06/24 1224  PROBNP 360.0*    CBG: No results for input(s): GLUCAP in the last 168 hours.     Signed:  Sigurd Pac MD.  [facility] 05/11/2024, 11:49 AM

## 2024-05-13 ENCOUNTER — Other Ambulatory Visit: Payer: Self-pay | Admitting: Physician Assistant

## 2024-05-13 DIAGNOSIS — D649 Anemia, unspecified: Secondary | ICD-10-CM

## 2024-05-13 DIAGNOSIS — I82402 Acute embolism and thrombosis of unspecified deep veins of left lower extremity: Secondary | ICD-10-CM

## 2024-05-14 ENCOUNTER — Telehealth: Payer: Self-pay

## 2024-05-14 ENCOUNTER — Inpatient Hospital Stay: Admitting: Physician Assistant

## 2024-05-14 ENCOUNTER — Ambulatory Visit (HOSPITAL_COMMUNITY)
Admission: RE | Admit: 2024-05-14 | Discharge: 2024-05-14 | Disposition: A | Source: Ambulatory Visit | Attending: Physician Assistant

## 2024-05-14 ENCOUNTER — Inpatient Hospital Stay: Attending: Physician Assistant

## 2024-05-14 VITALS — BP 97/58 | HR 103 | Temp 97.7°F | Resp 18 | Ht 72.0 in | Wt 181.0 lb

## 2024-05-14 DIAGNOSIS — I82402 Acute embolism and thrombosis of unspecified deep veins of left lower extremity: Secondary | ICD-10-CM

## 2024-05-14 DIAGNOSIS — D72829 Elevated white blood cell count, unspecified: Secondary | ICD-10-CM | POA: Diagnosis not present

## 2024-05-14 DIAGNOSIS — R600 Localized edema: Secondary | ICD-10-CM | POA: Insufficient documentation

## 2024-05-14 DIAGNOSIS — Z801 Family history of malignant neoplasm of trachea, bronchus and lung: Secondary | ICD-10-CM | POA: Diagnosis not present

## 2024-05-14 DIAGNOSIS — Z86718 Personal history of other venous thrombosis and embolism: Secondary | ICD-10-CM | POA: Diagnosis not present

## 2024-05-14 DIAGNOSIS — D649 Anemia, unspecified: Secondary | ICD-10-CM | POA: Insufficient documentation

## 2024-05-14 LAB — CMP (CANCER CENTER ONLY)
ALT: 14 U/L (ref 0–44)
AST: 12 U/L — ABNORMAL LOW (ref 15–41)
Albumin: 3.5 g/dL (ref 3.5–5.0)
Alkaline Phosphatase: 118 U/L (ref 38–126)
Anion gap: 8 (ref 5–15)
BUN: 48 mg/dL — ABNORMAL HIGH (ref 8–23)
CO2: 29 mmol/L (ref 22–32)
Calcium: 8.9 mg/dL (ref 8.9–10.3)
Chloride: 99 mmol/L (ref 98–111)
Creatinine: 1.52 mg/dL — ABNORMAL HIGH (ref 0.61–1.24)
GFR, Estimated: 44 mL/min — ABNORMAL LOW (ref >60)
Glucose, Bld: 141 mg/dL — ABNORMAL HIGH (ref 70–99)
Potassium: 4.3 mmol/L (ref 3.5–5.1)
Sodium: 136 mmol/L (ref 135–145)
Total Bilirubin: 1 mg/dL (ref 0.0–1.2)
Total Protein: 6.1 g/dL — ABNORMAL LOW (ref 6.5–8.1)

## 2024-05-14 LAB — IRON AND IRON BINDING CAPACITY (CC-WL,HP ONLY)
Iron: 144 ug/dL (ref 45–182)
Saturation Ratios: 47 % — ABNORMAL HIGH (ref 17.9–39.5)
TIBC: 308 ug/dL (ref 250–450)
UIBC: 164 ug/dL (ref 117–376)

## 2024-05-14 LAB — CBC WITH DIFFERENTIAL (CANCER CENTER ONLY)
Abs Immature Granulocytes: 1.26 K/uL — ABNORMAL HIGH (ref 0.00–0.07)
Basophils Absolute: 0.1 K/uL (ref 0.0–0.1)
Basophils Relative: 0 %
Eosinophils Absolute: 0 K/uL (ref 0.0–0.5)
Eosinophils Relative: 0 %
HCT: 34.6 % — ABNORMAL LOW (ref 39.0–52.0)
Hemoglobin: 11.1 g/dL — ABNORMAL LOW (ref 13.0–17.0)
Immature Granulocytes: 6 %
Lymphocytes Relative: 9 %
Lymphs Abs: 1.8 K/uL (ref 0.7–4.0)
MCH: 32.1 pg (ref 26.0–34.0)
MCHC: 32.1 g/dL (ref 30.0–36.0)
MCV: 100 fL (ref 80.0–100.0)
Monocytes Absolute: 1 K/uL (ref 0.1–1.0)
Monocytes Relative: 5 %
Neutro Abs: 16.3 K/uL — ABNORMAL HIGH (ref 1.7–7.7)
Neutrophils Relative %: 80 %
Platelet Count: 377 K/uL (ref 150–400)
RBC: 3.46 MIL/uL — ABNORMAL LOW (ref 4.22–5.81)
RDW: 20.4 % — ABNORMAL HIGH (ref 11.5–15.5)
Smear Review: NORMAL
WBC Count: 20.4 K/uL — ABNORMAL HIGH (ref 4.0–10.5)
nRBC: 0.4 % — ABNORMAL HIGH (ref 0.0–0.2)

## 2024-05-14 LAB — FERRITIN: Ferritin: 497 ng/mL — ABNORMAL HIGH (ref 24–336)

## 2024-05-14 NOTE — Telephone Encounter (Signed)
 No evidence of DVT   [facility], LPN can you let patient know and request them to follow up with [facility] to assist with wrapping his right leg   Pt's wife advised and agreed to plan

## 2024-05-14 NOTE — Progress Notes (Signed)
 [facility] Telephone:([phone]   [phone]  PROGRESS NOTE  Patient Care Team: Jeffrey Stewart [name], FNP as PCP - General (Nurse Practitioner) Jeffrey Oneil [name], MD as PCP - Cardiology (Cardiology) Jeffrey Favorite, MD as Consulting Physician (General Surgery)  Hematological/Oncological History # Elevated metamyelocytes # Thrombocytopenia # Macrocytosis 1) Labs from PCP, Tia McPhatter: -12/29/2020: WBC 9.8, Hgb 14.4, MCV 103.7, Plt 168,  Band Abs 0.8 (H), Myelo Abs 0.4 (H) -01/06/2021: WBC 9.8, Hgb 14.2, MCV 103.4, Plt 207, Mono Manuel Ab 1.1 (H), Meta Absolute 0.2 (H)  2) 01/21/2021: Establish care with Johnston Police PA-C  HISTORY OF PRESENTING ILLNESS:  Jeffrey Stewart 89 y.o. male returns for a follow up. Patient is accompanied by his wife for this visit. He was last seen in clinic on 10/24/2023. In the interim, he had a right hip fracture s/p ORIF on 03/2024  followed by hospitalization for GI bleed and hemorrhagic shock and was recently hospitalized from 05/06/2024-05/11/2024 due to acute left leg DVT   Today, Jeffrey Stewart is accompanied by his wife for this visit. Since his recent hospital discharge, he has noticed worsening right leg edema starting below the knee. He was previously wrapping his legs with home health but has not today. He denies any overt signs of bleeding from the Eliquis therapy including hematochezia or melena. He denies fevers, chills, sweats, shortness of breath, chest pain or cough. He has no other complaints. Rest of the 10 point ROS is below.   MEDICAL HISTORY:  Past Medical History:  Diagnosis Date   Allergy    Arthritis    MINOR ARTHRITIS FEET AND TOES   Asthma    Cataract    CHF (congestive heart failure) (HCC) 11/14/2009   EF45-50%   Coronary artery disease    Essential hypertension 05/07/2024   Glaucoma (increased eye pressure) 04/06/2024   Hyperlipidemia    Myocardial infarction (HCC)    Pneumonia    HX OF PNEUMONIA 4 OR 5 YRS  AGO   Rosacea    OF FACE   Shortness of breath    SOMETIMES SOB OR WHEEZING WITH EXERTION   Symptomatic anemia 04/18/2024    SURGICAL HISTORY: Past Surgical History:  Procedure Laterality Date   APPENDECTOMY     AGE 63   CARDIAC CATHETERIZATION  11/2009   mild / mod cad,MODERATE TIGHT STENOSIS IN THE DISTAL LEFT CIRCUMFLEX ARTERY   ELECTROCARDIOGRAM  11/2009   RBBB   ESOPHAGOGASTRODUODENOSCOPY N/A 04/15/2024   Procedure: EGD (ESOPHAGOGASTRODUODENOSCOPY);  Surgeon: Jeffrey Stewart, Toribio, MD;  Location: [facility];  Service: Gastroenterology;  Laterality: N/A;   EYE SURGERY     Jeffrey Stewart   INGUINAL HERNIA REPAIR Jeffrey 09/29/2013   Procedure: LAPAROSCOPIC Jeffrey  INGUINAL HERNIA;  Surgeon: Stewart Jeffrey Gail, MD;  Location: [facility];  Service: General;  Laterality: Jeffrey;   INSERTION OF MESH Jeffrey 09/29/2013   Procedure: INSERTION OF MESH;  Surgeon: Stewart Jeffrey Gail, MD;  Location: [facility];  Service: General;  Laterality: Jeffrey;   INTRAMEDULLARY (IM) NAIL INTERTROCHANTERIC Right 04/07/2024   Procedure: FIXATION, FRACTURE, INTERTROCHANTERIC, WITH INTRAMEDULLARY ROD;  Surgeon: Jeffrey Ozell LABOR, MD;  Location: [facility];  Service: Orthopedics;  Laterality: Right;   last echo 02/2009     last nuc 2009  12/16/2007   EF 33%. ABNORMAL. HE HAS APICAL DEFECT CONSISTENT WITH A SCAR.LV FUNCTION MODERATELY  DEPRESSED   US  ECHOCARDIOGRAPHY  02/23/2009   EF 45-50%   US  ECHOCARDIOGRAPHY  12/27/2007   EF 45-50%    SOCIAL HISTORY: Social History   Socioeconomic History   Marital status: Married    Spouse name: Not on file   Number of children: Not on file   Years of education: Not on file   Highest education level: Not on file  Occupational History   Not on file  Tobacco Use   Smoking status: Never   Smokeless tobacco: Never  Vaping Use   Vaping status: Never Used  Substance and Sexual Activity   Alcohol  use: Not Currently    Comment:  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   Drug use: No   Sexual activity: Not on file  Other Topics Concern   Not on file  Social History Narrative   Not on file   Social Drivers of Health   Financial Resource Strain: Not on file  Food Insecurity: No Food Insecurity (05/06/2024)   Hunger Vital Sign    Worried About Running Out of Food in the Last Year: Never true    Ran Out of Food in the Last Year: Never true  Transportation Needs: No Transportation Needs (05/06/2024)   PRAPARE - Administrator, Civil Service (Medical): No    Lack of Transportation (Non-Medical): No  Physical Activity: Not on file  Stress: Not on file  Social Connections: Moderately Integrated (05/06/2024)   Social Connection and Isolation Panel    Frequency of Communication with Friends and Family: More than three times a week    Frequency of Social Gatherings with Friends and Family: More than three times a week    Attends Religious Services: 1 to 4 times per year    Active Member of [employer] or Organizations: No    Attends Banker Meetings: Never    Marital Status: Married  Catering Manager Violence: Unknown (05/06/2024)   Humiliation, Afraid, Rape, and Kick questionnaire    Fear of Current or Ex-Partner: No    Emotionally Abused: No    Physically Abused: Not on file    Sexually Abused: No    FAMILY HISTORY: Family History  Problem Relation Age of Onset   Diabetes Sister    Lung cancer Sister        smoker    ALLERGIES:  is allergic to niaspan  [niacin ] and meloxicam.  MEDICATIONS:  Current Outpatient Medications  Medication Sig Dispense Refill   albuterol  (VENTOLIN  HFA) 108 (90 Base) MCG/ACT inhaler Inhale 2 puffs into the lungs every 4 (four) hours as needed for wheezing or shortness of breath. 1 each 1   apixaban (ELIQUIS) 5 MG TABS tablet Take 2 tablets (10 mg total) by mouth 2 (two) times daily for 2 days, THEN 1 tablet (5 mg total) 2 (two) times daily. 120 tablet 0   atorvastatin   (LIPITOR) 40 MG tablet TAKE 1 TABLET AT BEDTIME 90 tablet 3   brimonidine  (ALPHAGAN ) 0.2 % ophthalmic solution Place 1 drop into both eyes 2 (two) times daily.     carvedilol  (COREG ) 6.25 MG tablet Take 1 tablet (6.25 mg total) by mouth 2 (two) times daily with a meal. 60 tablet 0   cetirizine (ZYRTEC) 10 MG tablet Take 10 mg by mouth at bedtime.     Cholecalciferol  (VITAMIN D3) 1000 units CAPS Take 1,000 Units by mouth daily.     empagliflozin (JARDIANCE) 10 MG TABS tablet Take 1 tablet (10 mg total) by mouth daily. 30 tablet 0   ferrous sulfate 325 (65 FE) MG tablet Take 1 tablet (  325 mg total) by mouth daily with breakfast. 60 tablet 1   fish oil-omega-3 fatty acids  1000 MG capsule Take 1 g by mouth 2 (two) times daily.     fluticasone  (FLONASE ) 50 MCG/ACT nasal spray Place 1-2 sprays into both nostrils daily as needed for allergies or rhinitis.     fluticasone -salmeterol (ADVAIR) 100-50 MCG/ACT AEPB Inhale 1 puff into the lungs 2 (two) times daily.     glucosamine-chondroitin 500-400 MG tablet Take 1 tablet by mouth at bedtime.     ipratropium-albuterol  (DUONEB) 0.5-2.5 (3) MG/3ML SOLN Take 3 mLs by nebulization every 6 (six) hours as needed (wheezing, shortness of breath).     latanoprost  (XALATAN ) 0.005 % ophthalmic solution Place 1 drop into both eyes at bedtime.     loperamide (IMODIUM) 2 MG capsule Take 2 mg by mouth 2 (two) times daily as needed for diarrhea or loose stools.     metroNIDAZOLE  (METROCREAM ) 0.75 % cream Apply 1 application  topically 2 (two) times daily as needed (for rosacea).     mycophenolate  (CELLCEPT ) 500 MG tablet TAKE 1 TABLET BY MOUTH TWICE A DAY (Patient taking differently: Take 1,000 mg by mouth 2 (two) times daily.) 180 tablet 1   NEOMYCIN -POLYMYXIN-HYDROCORTISONE  (CORTISPORIN) 1 % SOLN OTIC solution Place 1 drop into both ears as needed (when ears are stopped up).     pantoprazole (PROTONIX) 40 MG tablet Take 1 tablet (40 mg total) by mouth 2 (two) times daily.  60 tablet 1   potassium chloride  SA (KLOR-CON  M) 20 MEQ tablet Take 1 tablet (20 mEq total) by mouth daily. 90 tablet 3   predniSONE  (DELTASONE ) 1 MG tablet Take 1 tablet (1 mg total) by mouth at bedtime. Restart after finished with prednisone  taper     predniSONE  (DELTASONE ) 5 MG tablet Take 1 tablet (5 mg total) by mouth daily with breakfast. Restart after finished with prednisone  taper     sodium chloride  (OCEAN) 0.65 % nasal spray Place 1 spray into the nose as needed for congestion.     spironolactone (ALDACTONE) 25 MG tablet Take 1 tablet (25 mg total) by mouth daily. 60 tablet 0   torsemide  (DEMADEX ) 20 MG tablet Take 2 tablets (40 mg total) by mouth daily. 120 tablet 0   No current facility-administered medications for this visit.    REVIEW OF SYSTEMS:   Constitutional: ( - ) fevers, ( - )  chills , ( - ) night sweats Eyes: ( - ) blurriness of vision, ( - ) double vision, ( - ) watery eyes Ears, nose, mouth, throat, and face: ( - ) mucositis, ( - ) sore throat Respiratory: ( -) cough, ( - ) dyspnea, ( -) wheezes  Cardiovascular: ( - ) palpitation, ( - ) chest discomfort, ( + ) lower extremity swelling Gastrointestinal:  ( - ) nausea, ( - ) heartburn, ( - ) change in bowel habits Skin: ( - ) abnormal skin rashes Lymphatics: ( - ) new lymphadenopathy, ( - ) easy bruising Neurological: ( - ) numbness, ( - ) tingling, ( - ) new weaknesses Behavioral/Psych: ( - ) mood change, ( - ) new changes  All other systems were reviewed with the patient and are negative.  PHYSICAL EXAMINATION: ECOG PERFORMANCE STATUS: 1 - Symptomatic but completely ambulatory  Vitals:   05/14/24 0935  BP: (!) 97/58  Pulse: (!) 103  Resp: 18  Temp: 97.7 F (36.5 C)  SpO2: 100%   Filed Weights   05/14/24 0935  Weight:  181 lb (82.1 kg)    GENERAL: well appearing male in NAD  SKIN: skin color, texture, turgor are normal, no rashes or significant lesions. Bruising over both arms.  EYES: conjunctiva are  pink and non-injected, sclera clear LUNGS: clear to auscultation and percussion with normal breathing effort HEART: regular rate & rhythm and no murmurs. Right below the knee pitting edema with venous stasis.  Musculoskeletal: no cyanosis of digits and no clubbing  PSYCH: alert & oriented x 3, fluent speech NEURO: no focal motor/sensory deficits  LABORATORY DATA:  I have reviewed the data as listed    Latest Ref Rng & Units 05/14/2024    9:04 AM 05/11/2024    1:55 AM 05/10/2024    3:16 AM  CBC  WBC 4.0 - 10.5 K/uL 20.4  12.6  8.4   Hemoglobin 13.0 - 17.0 g/dL 88.8  89.3  9.9   Hematocrit 39.0 - 52.0 % 34.6  33.3  31.8   Platelets 150 - 400 K/uL 377  242  202        Latest Ref Rng & Units 05/11/2024    1:55 AM 05/10/2024    3:16 AM 05/09/2024    2:34 AM  CMP  Glucose 70 - 99 mg/dL 866  875  874   BUN 8 - 23 mg/dL 37  26  22   Creatinine 0.61 - 1.24 mg/dL 8.57  8.65  8.68   Sodium 135 - 145 mmol/L 136  135  135   Potassium 3.5 - 5.1 mmol/L 4.7  4.0  3.7   Chloride 98 - 111 mmol/L 97  96  98   CO2 22 - 32 mmol/L 27  29  27    Calcium  8.9 - 10.3 mg/dL 8.7  8.6  8.2     ASSESSMENT & PLAN Jeffrey Stewart is a 89 y.o. male that presents to the clinic for a follow up for the following:  #Right Leg Edema: --Marked edema noted between the knee.  --Discussed symptoms with Dr. Federico. We will obtain venous US  to evaluate for DVT. If there is no evidence of DVT, then follow up with home health to help with ace wrapping.  #Leukocytosis, neutrophil predominant: --Patient is current taking prednisone  6 mg/day which is contributing to his leukocytosis --Monitor closely for any infectious symptoms including fevers and chills  #Anemia: --Differentials include CKD, chronic disease/inflammation --Patient is not taking any medication that could cause macrocytosis --Patient has not consumed alcohol  recently but reports prior heavy consumption --Recent labs from 04/12/2024 showed no evidence  of iron, vitamin B12 or folate deficiency. --Labs today show Hgb 11.1, MCV 100.0. Iron levels pending.   #Thrombocytopenia--resolved: -Mild and levels oscillate back and forth to normal. Today's Platelet count is 377K -Record review shows CT scan from 08/28/2013 that shows mild splenomegaly.  -Abdominal US  from 11/04/2021 confirmed continued splenomegaly with no overt liver disease.   Follow up: --RTC in 4 weeks to re-assess  No orders of the defined types were placed in this encounter.  All questions were answered. The patient knows to call the clinic with any problems, questions or concerns.  I have spent a total of 30 minutes minutes of face-to-face and non-face-to-face time, preparing to see the patient, performing a medically appropriate examination, counseling and educating the patient, ordering tests, documenting clinical information in the electronic health record,and care coordination.   Johnston Police PA-C Dept of Hematology and Oncology [facility] Phone: [phone]

## 2024-05-15 ENCOUNTER — Ambulatory Visit: Payer: Self-pay | Admitting: Physician Assistant

## 2024-05-15 NOTE — Progress Notes (Signed)
 HEART & VASCULAR TRANSITION OF CARE CONSULT NOTE     Referring Physician: Dr. Fairy, Cardiology  Primary Cardiologist: Dr. Alveta (Reassigned to Dr. Jeffrie)   Chief Complaint: HFmrEF   HPI: Referred to clinic by Dr. Fairy for heart failure consultation.   Jeffrey Stewart is a 89 y.o. male w/ hx of HFrEF, Pulmonary HTN with RV failure, asthma, COPD, polymyalgia rheumatica and seronegative RA, interstitial lung disease, pulmonary fibrosis, coronary artery disease, RBBB, hyperlipidemia and macrocytic anemia.   Has had rough course recently w/ several hospitalizations.   Admitted 9/25 post mechanical fall w/ resultant rt hip fracture, s/p ORIF. Discharged home on 04/12/24.  Readmitted 04/14/24 w/ hematemesis/ GIB/ hemorrhagic shock. Required 7 units PRBC, pressors, EGD noted multiple nonbleeding gastric ulcers, eventually stabilized and discharged home on 10/6.  Presented back to the ED on 10/22 w/ complaint of leg swelling. Diagnosed w/ LLE DVT. Started on Eliquis w/ twice daily PPI. H/H remained stable. Hospital course also c/b a/c CHF w/ volume overload. Echo EF 45-50%, grade 1 DD, mildly reduced RV, moderately elevated PASP. Reportedly diuresed 10 L w/ IV Lasix , and transitioned to PO torsemide , Jardiance and spironolactone. Referred to TOC clinc at d/c. Discharge wt 185 lb.   Presents today for f/u. Here w/ wife and daughter. Doing well. Continues to progress and getting PT at home. Denies resting dyspnea. Reports stable NYHA Class II symptoms. Wt stable at 184 lb. BP soft 92/56. No orthostatic symptoms. . No further bleeding.   Cardiac Testing   Echo 10/25  1. Left ventricular ejection fraction, by estimation, is 45 to 50%. The  left ventricle has mildly decreased function. The left ventricle  demonstrates global hypokinesis. There is moderate asymmetric left  ventricular hypertrophy of the basal-septal  segment. Left ventricular diastolic parameters are consistent with Grade  I  diastolic dysfunction (impaired relaxation).   2. Right ventricular systolic function is mildly reduced. The right  ventricular size is mildly enlarged. There is moderately elevated  pulmonary artery systolic pressure. The estimated right ventricular  systolic pressure is 45.5 mmHg.   3. The mitral valve is normal in structure. Trivial mitral valve  regurgitation. No evidence of mitral stenosis.   4. The aortic valve is grossly normal. Aortic valve regurgitation is mild  to moderate. No aortic stenosis is present.   5. Aortic dilatation noted. There is mild dilatation of the aortic root,  measuring 41 mm.   6. The inferior vena cava is dilated in size with <50% respiratory  variability, suggesting right atrial pressure of 15 mmHg.   Comparison(s): No significant change from prior study.      Past Medical History:  Diagnosis Date   Allergy    Arthritis    MINOR ARTHRITIS FEET AND TOES   Asthma    Cataract    CHF (congestive heart failure) (HCC) 11/14/2009   EF45-50%   Coronary artery disease    Essential hypertension 05/07/2024   Glaucoma (increased eye pressure) 04/06/2024   Hyperlipidemia    Myocardial infarction (HCC)    Pneumonia    HX OF PNEUMONIA 4 OR 5 YRS AGO   Rosacea    OF FACE   Shortness of breath    SOMETIMES SOB OR WHEEZING WITH EXERTION   Symptomatic anemia 04/18/2024    Current Outpatient Medications  Medication Sig Dispense Refill   albuterol  (VENTOLIN  HFA) 108 (90 Base) MCG/ACT inhaler Inhale 2 puffs into the lungs every 4 (four) hours as needed for wheezing or  shortness of breath. 1 each 1   apixaban (ELIQUIS) 5 MG TABS tablet Take 2 tablets (10 mg total) by mouth 2 (two) times daily for 2 days, THEN 1 tablet (5 mg total) 2 (two) times daily. 120 tablet 0   atorvastatin  (LIPITOR) 40 MG tablet TAKE 1 TABLET AT BEDTIME 90 tablet 3   brimonidine  (ALPHAGAN ) 0.2 % ophthalmic solution Place 1 drop into both eyes 2 (two) times daily.     carvedilol   (COREG ) 6.25 MG tablet Take 1 tablet (6.25 mg total) by mouth 2 (two) times daily with a meal. 60 tablet 0   cetirizine (ZYRTEC) 10 MG tablet Take 10 mg by mouth at bedtime.     Cholecalciferol  (VITAMIN D3) 1000 units CAPS Take 1,000 Units by mouth daily.     empagliflozin (JARDIANCE) 10 MG TABS tablet Take 1 tablet (10 mg total) by mouth daily. 30 tablet 0   ferrous sulfate 325 (65 FE) MG tablet Take 1 tablet (325 mg total) by mouth daily with breakfast. 60 tablet 1   fish oil-omega-3 fatty acids  1000 MG capsule Take 1 g by mouth 2 (two) times daily.     fluticasone  (FLONASE ) 50 MCG/ACT nasal spray Place 1-2 sprays into both nostrils daily as needed for allergies or rhinitis.     fluticasone -salmeterol (ADVAIR) 100-50 MCG/ACT AEPB Inhale 1 puff into the lungs 2 (two) times daily.     glucosamine-chondroitin 500-400 MG tablet Take 1 tablet by mouth at bedtime.     ipratropium-albuterol  (DUONEB) 0.5-2.5 (3) MG/3ML SOLN Take 3 mLs by nebulization every 6 (six) hours as needed (wheezing, shortness of breath).     latanoprost  (XALATAN ) 0.005 % ophthalmic solution Place 1 drop into both eyes at bedtime.     loperamide (IMODIUM) 2 MG capsule Take 2 mg by mouth 2 (two) times daily as needed for diarrhea or loose stools.     metroNIDAZOLE  (METROCREAM ) 0.75 % cream Apply 1 application  topically 2 (two) times daily as needed (for rosacea).     mycophenolate  (CELLCEPT ) 500 MG tablet TAKE 1 TABLET BY MOUTH TWICE A DAY 180 tablet 1   NEOMYCIN -POLYMYXIN-HYDROCORTISONE  (CORTISPORIN) 1 % SOLN OTIC solution Place 1 drop into both ears as needed (when ears are stopped up).     pantoprazole (PROTONIX) 40 MG tablet Take 1 tablet (40 mg total) by mouth 2 (two) times daily. 60 tablet 1   potassium chloride  SA (KLOR-CON  M) 20 MEQ tablet Take 1 tablet (20 mEq total) by mouth daily. 90 tablet 3   predniSONE  (DELTASONE ) 1 MG tablet Take 1 tablet (1 mg total) by mouth at bedtime. Restart after finished with prednisone   taper     predniSONE  (DELTASONE ) 5 MG tablet Take 1 tablet (5 mg total) by mouth daily with breakfast. Restart after finished with prednisone  taper     sodium chloride  (OCEAN) 0.65 % nasal spray Place 1 spray into the nose as needed for congestion.     spironolactone (ALDACTONE) 25 MG tablet Take 1 tablet (25 mg total) by mouth daily. 60 tablet 0   torsemide  (DEMADEX ) 20 MG tablet Take 2 tablets (40 mg total) by mouth daily. 120 tablet 0   No current facility-administered medications for this encounter.    Allergies  Allergen Reactions   Niaspan  [Niacin ] Other (See Comments)    Intolerance flush feeling when on large dose - is able to take smaller dosage without problem    Meloxicam Other (See Comments)    Disorientation  Social History   Socioeconomic History   Marital status: Married    Spouse name: Not on file   Number of children: Not on file   Years of education: Not on file   Highest education level: Not on file  Occupational History   Not on file  Tobacco Use   Smoking status: Never   Smokeless tobacco: Never  Vaping Use   Vaping status: Never Used  Substance and Sexual Activity   Alcohol  use: Not Currently    Comment: 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   Drug use: No   Sexual activity: Not on file  Other Topics Concern   Not on file  Social History Narrative   Not on file   Social Drivers of Health   Financial Resource Strain: Not on file  Food Insecurity: Low Risk  (05/19/2024)   Received from [facility]   Hunger Vital Sign    Within the past 12 months, you worried that your food would run out before you got money to buy more: Never true    Within the past 12 months, the food you bought just didn't last and you didn't have money to get more. : Never true  Transportation Needs: No Transportation Needs (05/19/2024)   Received from [facility]    In the past 12 months, has lack of reliable transportation kept you from medical appointments,  meetings, work or from getting things needed for daily living? : No  Physical Activity: Not on file  Stress: Not on file  Social Connections: Moderately Integrated (05/06/2024)   Social Connection and Isolation Panel    Frequency of Communication with Friends and Family: More than three times a week    Frequency of Social Gatherings with Friends and Family: More than three times a week    Attends Religious Services: 1 to 4 times per year    Active Member of [employer] or Organizations: No    Attends Banker Meetings: Never    Marital Status: Married  Catering Manager Violence: Unknown (05/06/2024)   Humiliation, Afraid, Rape, and Kick questionnaire    Fear of Current or Ex-Partner: No    Emotionally Abused: No    Physically Abused: Not on file    Sexually Abused: No      Family History  Problem Relation Age of Onset   Diabetes Sister    Lung cancer Sister        smoker    Vitals:   05/21/24 0919  BP: (!) 92/56  Pulse: 78  SpO2: 99%  Weight: 83.7 kg (184 lb 9.6 oz)  Height: 6' (1.829 m)    Physical Exam  GENERAL: NAD Lungs- clear  CARDIAC:  JVP: not elevated         Normal rate with regular rhythm. No murmur.  No pitting edema  ABDOMEN: Soft, non-tender, non-distended.  EXTREMITIES: Warm and well perfused.  NEUROLOGIC: No obvious FND   ECG: NSR 77 bpm, personally reviewed    ASSESSMENT & PLAN:  1. HFmrEF w/ Prominent RV Dysfunction/ Pulmonary HTN - Echo EF 45-50%, grade 1 DD, mildly reduced RV, moderately elevated PASP - Suspect recent volume overload triggered by underlying diastolic dysfunction and large volume resuscitation for GIB (required 7 uRBC transfusion)  - volume remains stable after diuresis. Euvolemic on exam. Wt stable and c/w d/c dry weight. Stable NYHA Class II symptoms - continue torsemide  40 mg daily - continue spiro 25 mg daily  - continue Jardiance 10 mg  daily  - continue Coreg  6.25 mg bid. BP too soft for further titration  -  Reviewed f/u labs done 2 days ago and SCr/K both stable/WNL.  - Suspect primarily WHO Group 3 PH asthma, COPD interstitial lung disease, pulmonary fibrosis + possible WHO Group 1 component given h/o RA. Given age, co morbidities and minimal symptoms, no plans further w/u - advised on low salt diet/ fluid restriction and continuation of daily wts  2. LLE DVT - provoked in setting of recent hip surgery/ immobility  - Eliquis 5 mg bid   3. Recent GIB - denies any further gross bleeding  - on PPI     Referred to HFSW (PCP, Medications, Transportation, ETOH Abuse, Drug Abuse, Insurance, Financial ): No  Refer to Pharmacy:  No Refer to Home Health:  already followed  Refer to [facility]: No   Refer to General Cardiology: Yes   Follow up: keep f/u w/ Dr. Jeffrie as already planned.   Caffie Shed, PA-C 05/21/2024

## 2024-05-19 ENCOUNTER — Encounter: Payer: Self-pay | Admitting: Radiology

## 2024-05-21 ENCOUNTER — Inpatient Hospital Stay (HOSPITAL_BASED_OUTPATIENT_CLINIC_OR_DEPARTMENT_OTHER)
Admit: 2024-05-21 | Discharge: 2024-05-21 | Disposition: A | Discharge status: Home / Self Care | Attending: Cardiology | Admitting: Cardiology

## 2024-05-21 ENCOUNTER — Encounter (INDEPENDENT_AMBULATORY_CARE_PROVIDER_SITE_OTHER): Payer: Self-pay | Admitting: *Deleted

## 2024-05-21 ENCOUNTER — Other Ambulatory Visit: Payer: Self-pay | Admitting: Pulmonary Disease

## 2024-05-21 ENCOUNTER — Encounter (HOSPITAL_COMMUNITY): Payer: Self-pay

## 2024-05-21 VITALS — BP 92/56 | HR 78 | Ht 72.0 in | Wt 184.6 lb

## 2024-05-21 DIAGNOSIS — I5032 Chronic diastolic (congestive) heart failure: Secondary | ICD-10-CM

## 2024-05-21 NOTE — Patient Instructions (Signed)
  Follow-Up in: FOLLOW UP WITH GENERAL CARDIOLOGY AS PLANNED   At the [facility], you and your health needs are our priority. We have a designated team specialized in the treatment of Heart Failure. This Care Team includes your primary Heart Failure Specialized Cardiologist (physician), Advanced Practice Providers (APPs- Physician Assistants and Nurse Practitioners), and Pharmacist who all work together to provide you with the care you need, when you need it.   You may see any of the following providers on your designated Care Team at your next follow up:  Dr. Toribio Fuel Dr. Ezra Shuck Dr. Odis Brownie Greig Mosses, NP Caffie Shed, [place] [facility] [place], [place] Beckey Coe, NP Jordan Lee, NP Tinnie Redman, PharmD   Please be sure to bring in all your medications bottles to every appointment.   Need to Contact Us :  If you have any questions or concerns before your next appointment please send us  a message through [place] or call our office at [phone].    TO LEAVE A MESSAGE FOR THE NURSE SELECT OPTION 2, PLEASE LEAVE A MESSAGE INCLUDING: YOUR NAME DATE OF BIRTH CALL BACK NUMBER REASON FOR CALL**this is important as we prioritize the call backs  YOU WILL RECEIVE A CALL BACK THE SAME DAY AS LONG AS YOU CALL BEFORE 4:00 PM

## 2024-05-23 ENCOUNTER — Other Ambulatory Visit: Payer: Self-pay | Admitting: Physician Assistant

## 2024-05-26 ENCOUNTER — Ambulatory Visit: Admitting: Orthopedic Surgery

## 2024-05-26 ENCOUNTER — Other Ambulatory Visit (INDEPENDENT_AMBULATORY_CARE_PROVIDER_SITE_OTHER): Payer: Self-pay

## 2024-05-26 DIAGNOSIS — M25551 Pain in right hip: Secondary | ICD-10-CM

## 2024-05-26 MED ORDER — ATORVASTATIN CALCIUM 40 MG PO TABS: 40.0000 mg | ORAL_TABLET | Freq: Every day | ORAL | 2 refills | Status: AC

## 2024-05-26 NOTE — Progress Notes (Signed)
 Orthopedic Surgery Post-operative Office Visit   Procedure: right intertrochanteric femur fracture s/p CMN Date of Surgery: 04/12/2024 (~6 weeks post-op)   Assessment: Patient is a 89 y.o. who is doing as expected after surgery     Plan: -Operative plans complete -Weight bearing as tolerated right lower extremity Continue to work with PT.  Encouraged him to do hip flexor strengthening exercises even when out with PT -Okay to submerge wounds at this point -Pain management: OTC medications as needed -Return to office in 6 weeks, x-rays needed at next visit: AP/lateral hip right hip   ___________________________________________________________________________     Subjective: Patient has been doing well since he was last seen in the office.  Not taking any medications regularly for pain.  He is working with home health PT.  He is able to walk around well with a walker.  He is working on hip flexor strengthening with PT.  Has not noticed any redness or drainage around his incisions.   Objective:   General: no acute distress, appropriate affect Neurologic: alert, answering questions appropriately, following commands Respiratory: unlabored breathing on room air Skin: incisions are well healed with no erythema, induration, active/expressible drainage   MSK (RLE): Able to stand from seated position, hip flexor weakness noted but does have antigravity strength, EHL/TA/GSC intact, plantarflexes and dorsiflexes toes, sensation intact light touch in sural/saphenous/deep peroneal/superficial peroneal/tibial nerve distributions, foot warm and well-perfused   Imaging: XRs of the right hip from 05/26/2024 were independently reviewed and interpreted, showing a well reduced intertrochanteric femur fracture.  No lucency seen around the short cephalomedullary rod or the lag screws or the interlocking screw.  Lag screws well centered within the femoral head.  No new fracture seen.  No dislocation seen.      Patient name: Jeffrey Stewart Patient MRN: [id number] Date of visit: 05/26/24

## 2024-05-30 ENCOUNTER — Other Ambulatory Visit: Payer: Self-pay | Admitting: Cardiology

## 2024-06-02 MED ORDER — CARVEDILOL 6.25 MG PO TABS: 6.2500 mg | ORAL_TABLET | Freq: Two times a day (BID) | ORAL | 2 refills | Status: AC

## 2024-06-04 ENCOUNTER — Encounter: Payer: Self-pay | Admitting: Internal Medicine

## 2024-06-04 ENCOUNTER — Ambulatory Visit (INDEPENDENT_AMBULATORY_CARE_PROVIDER_SITE_OTHER): Admitting: Internal Medicine

## 2024-06-04 VITALS — BP 103/61 | HR 98 | Temp 97.5°F | Ht 72.0 in | Wt 189.2 lb

## 2024-06-04 DIAGNOSIS — K259 Gastric ulcer, unspecified as acute or chronic, without hemorrhage or perforation: Secondary | ICD-10-CM

## 2024-06-04 DIAGNOSIS — Z7901 Long term (current) use of anticoagulants: Secondary | ICD-10-CM

## 2024-06-04 DIAGNOSIS — D649 Anemia, unspecified: Secondary | ICD-10-CM | POA: Diagnosis not present

## 2024-06-04 DIAGNOSIS — I82402 Acute embolism and thrombosis of unspecified deep veins of left lower extremity: Secondary | ICD-10-CM

## 2024-06-04 NOTE — Progress Notes (Addendum)
 [facility] [place]  Health Maintenance Visit [name] DOB: 1934/09/30  MRN: [id number] Visit Date: 06/04/2024  Encounter Provider: Bernardino JAYSON Level, FNP  HPI HEALTH MAINTENANCE: Jeffrey Stewart presents for a health maintenance exam.  Takes care of his great grandchild. SCREENING TESTS: ---Last physical exam: 05/2023 ---Last colonoscopy: No longer needed due to age ---Last PSA: No longer needed due to age ---Last dentist visit: Wears dentures ---Vision problems: Wears glasses - sees eye doctor every 6 months to 1 year ---Blood pressure problems: history of hypotension; patient is doing better with his current regimen LIFESTYLE:  ---Sleep hours: adequate (between 6-8 hours) ---Seatbelt use: always ---Diet: Fairly healthy ---Exercise: Likes to walk!  Does PT as well ---Alcohol : Denies ---Caffeine: 1 cup of coffee daily, 1 soda per day, no energy drinks.  On 64 ounce fluid restriction. LAB/IMMUNIZATIONS: ---Last lipid profile: Today ---Last tetanus vaccine: 2022 ---Last influenza vaccine: UTD 2025 ---COVID vaccine: UTD ---RSV vaccine: 05/2022 ---Last pneumococcal vaccine: 2013/2015 ---Shingrix vaccine: 2021/2022 MEDICATIONS: OTC Vitamins: Vitamin B12, Vitamin D , Turmeric, Glucosamine, Fish Oil, Potassium Other OTC Meds: Tylenol  as needed  History of Present Illness The patient is an 89 year old male here for an annual Medicare wellness visit, annual physical, and medication check. In the past, he has been seen for COPD, interstitial pulmonary disease, coronary artery disease, essential hypertension, rosacea, prediabetes, and seasonal allergies. He was recently hospitalized earlier this fall for acute GI bleeding, bilateral lower extremity edema, chronic diastolic congestive heart failure, and DVT of the left peroneal vein.  CARDIOLOGY: Since the last visit, he reports an improvement in his condition with no recent hospitalizations. He has been using a walker  for mobility and has been under the care of a cardiologist since 05/21/2024. His heart rate was reported to be in the 70s, which is considered good for his age. He has been advised to continue his current regimen of torsemide  40 mg daily, spironolactone  25 mg daily, Jardiance  10 mg daily, and carvedilol  6.25 mg twice daily. He is also on Eliquis  5 mg twice daily for a left lower extremity blood clot, which was believed to be provoked by recent hip surgery and prolonged immobility. He requires refills for Jardiance  and Eliquis . He has been advised to continue Eliquis  for another 4 to 6 months. His blood pressure remains stable.  ALLERGIES: For allergic rhinitis and seasonal allergies, he uses Flonase  nasal spray and saline nasal spray.  COPD/ILD: For COPD and interstitial lung disease, he uses Advair as a maintenance inhaler. His last appointment with the pulmonologist was in 06/2023. He continues to take CellCept , two pills in the morning and two in the evening. He uses albuterol  as a rescue inhaler and DuoNeb at home via a nebulizer machine. He has been advised by his home health nurse to use the nebulizer daily.  ROSACEA: For rosacea, he takes minocycline  and metronidazole  cream as needed, with occasional flare-ups.  No current rash.  GI BLEED HISTORY: For GI bleeding, he takes pantoprazole  and has a scheduled appointment with his gastroenterologist today.  PREVENTATIVE: He takes B12, vitamin D , fish oil, glucosamine for his joints, and turmeric. He has been experiencing neck pain, which he attributes to sleeping in a chair. He takes Tylenol  twice daily for pain relief and uses a heating pad and Voltaren  for pain management. He has been experiencing easy bruising, which he believes is due to his anticoagulation therapy. He has a lump on his incision site from previous hip surgery, which he first noticed  a few days ago. He has a scheduled appointment with his surgeon on 07/07/2024. He has long  toenails and has been advised against trimming them at home. He is interested in seeing a podiatrist for this issue.  Hobbies: Taking care of his great grandchild Diet: Low salt diet Alcohol : Does not consume alcohol  Coffee/Tea/Caffeine-containing Drinks: Drinks one cup of coffee and one soda daily Sleep: Sleeps about 5 to 9 hours per night  PAST SURGICAL HISTORY: - Recent hip surgery - Previous hip surgery   ROS-  Review of Systems  HENT:  Positive for hearing loss (bilateral).   Musculoskeletal:  Positive for myalgias (neck).  Allergic/Immunologic: Positive for environmental allergies.    Medical History: Medical History[1]  Patient Active Problem List   Diagnosis Date Noted  . Interstitial pulmonary disease (HCC) 05/29/2022  . Thrombocytopenia (HCC) 04/25/2021  . Chronic fatigue syndrome 04/06/2021  . Overweight 04/06/2021  . Polyarthralgia 04/06/2021  . Polymyalgia rheumatica (HCC) 04/06/2021  . Macrocytosis 01/21/2021  . Abnormal computed tomography of gastrointestinal tract 12/29/2020  . Abdominal pain 12/29/2020  . Blood in stool 12/29/2020  . 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 Laryngopharyngeal reflux (LPR) 06/01/2020  . Arthralgia of left ankle 01/20/2019  . Acute otitis externa of right ear 03/08/2016  . Seborrheic keratosis, inflamed 10/08/2015  . Allergic rhinitis 09/13/2015  . CAD (coronary artery disease) 09/13/2015  . CHF (congestive heart failure) (HCC) 09/13/2015  . COPD (chronic obstructive pulmonary disease) (HCC) 09/13/2015  . 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 Hypercholesterolemia 09/13/2015  . RBBB 09/13/2015  . Rosacea 09/13/2015  . Hearing loss 09/13/2015  . Arthritis 09/13/2015  . Essential hypertension 09/13/2015  . Bilateral inguinal hernia 09/19/2013    Current Medications:  Medications Ordered Prior to Encounter[2] Current Medications[3]  Allergies: Allergies[4]   Immunizations:  Immunization History  Administered Date(s)  Administered  . Influenza Vaccine, Quadrivalent, Adjuvanted 04/28/2020  . Influenza, High-dose Seasonal, Quadrivalent, Preservative Free 04/18/2016, 04/27/2017, 04/22/2018, 04/17/2019, 04/22/2020, 06/16/2021, 04/12/2022  . Influenza, Unspecified 04/21/2014  . Influenza, high-dose, trivalent, PF 04/18/2016, 04/27/2017, 04/22/2018, 04/17/2019, 04/22/2020, 06/16/2021, 04/12/2022, 03/12/2023, 05/19/2024  . Influenza, split virus, trivalent, preservative 04/21/2014, 04/30/2020  . Moderna Covid-19, mRNA,LNP-S,PF 12+ Yrs 05/29/2022  . Moderna SARS-CoV-2 Primary Series 12+ yrs 08/08/2019, 09/08/2019, 05/22/2020  . Novel Influenza H1n1-09 06/22/2008  . Pfizer SARS-CoV-2 Primary Series 12+ yrs 12/31/2019  . Pneumococcal Conjugate 13-Valent 11/13/2013  . Pneumococcal Polysaccharide Vaccine, 23 Valent (PNEUMOVAX-23) 2Y+ 09/06/2011  . RSV recombinant,PF(AREXVY)50Y+, not Medicare 05/29/2022  . TD vaccine (ADULT)PF(TENIVAC) 7Y+ 01/04/2006, 07/19/2020  . Td (Adult), Unspecified 01/04/2006  . Varicella Zoster [facility]) 18Y+ 05/12/2020, 07/19/2020  . Zoster, Live 04/05/2011  . Zoster, Unspecified 05/12/2020, 07/19/2020    Surgical History- Surgical History[5]  Family history- Family History[6] Social history- Social History[7]   Physical Exam: BP (!) 109/53   Pulse 80   Temp 98.6 F (37 C)   Resp 17   Ht 1.829 m (6')   Wt 84.7 kg (186 lb 12.8 oz)   SpO2 96%   BMI 25.33 kg/m  Wt Readings from Last 3 Encounters:  06/04/24 84.7 kg (186 lb 12.8 oz)  05/19/24 83.6 kg (184 lb 6.4 oz)  12/24/23 89.4 kg (197 lb)   Physical Exam Vitals and nursing note reviewed.  Constitutional:      Appearance: Normal appearance. He is normal weight.     Comments: +AMBULATING WITH A WALKER  HENT:     Head: Normocephalic and atraumatic.     Right Ear: Tympanic membrane, ear  canal and external ear normal.     Left Ear: Tympanic membrane, ear canal and external ear normal.     Nose: Nose normal.      Mouth/Throat:     Mouth: Mucous membranes are moist.     Dentition: Has dentures (UPPER AND LOWER).     Pharynx: Oropharynx is clear.  Eyes:     Extraocular Movements: Extraocular movements intact.     Pupils: Pupils are equal, round, and reactive to light.     Comments: +WEARS CORRECTIVE LENSES  Neck:     Vascular: No carotid bruit.  Cardiovascular:     Rate and Rhythm: Normal rate and regular rhythm.     Pulses: Normal pulses.     Heart sounds: Normal heart sounds.  Pulmonary:     Effort: Pulmonary effort is normal. No respiratory distress.     Breath sounds: Normal breath sounds. No wheezing.  Abdominal:     General: Abdomen is flat. Bowel sounds are normal. There is no distension.     Palpations: Abdomen is soft.     Tenderness: There is no abdominal tenderness. There is no guarding.  Musculoskeletal:        General: No swelling. Normal range of motion.     Cervical back: Normal range of motion and neck supple.     Comments: STRENGTH 5/5 BILATERALLY IN UPPER AND LOWER EXTREMITIES  Lymphadenopathy:     Cervical: No cervical adenopathy.  Skin:    General: Skin is warm and dry.     Findings: Lesion (small lump near RIGHT hip replacement incision; no warmth, swelling, redness or drainage.  Does not appear concerning at this time) present.     Comments: +HEALING INCISION OF RIGHT HIP  Neurological:     General: No focal deficit present.     Mental Status: He is alert and oriented to person, place, and time.  Psychiatric:        Mood and Affect: Mood normal.        Behavior: Behavior normal.      Assessment and Plan:  Assessment & Plan 1. Chronic Diastolic Congestive Heart Failure: - The cardiologist's recommendation is to continue torsemide  40 mg daily, spironolactone 25 mg daily, Jardiance 10 mg daily, and carvedilol  6.25 mg twice daily. - Echocardiogram shows 45-50% ejection fraction with mildly reduced right ventricle and no excess fluid. - A prescription refill for  Eliquis 5 mg twice daily and Jardiance 10 mg daily will be provided today.  2. Deep Vein Thrombosis (DVT) of Left Peroneal Vein: - The cardiologist believes the DVT was provoked by recent hip surgery and prolonged immobility. - The plan is to continue Eliquis 5 mg twice daily for another 4 to 6 months. - A prescription refill for Eliquis will be provided today.  3. Chronic Obstructive Pulmonary Disease (COPD) / Interstitial Lung Disease: - His condition is stable overall with slight improvement in lung function. - He is currently using Advair as a maintenance inhaler and albuterol  as a rescue inhaler. - He also uses DuoNeb with a nebulizer machine daily as recommended by the home health nurse. - An appointment with the pulmonologist will be scheduled.  4. Rosacea: - He continues to use minocycline  and metronidazole  cream as needed. - No significant flare-ups have been reported recently.  5. Allergic Rhinitis: - He is using Flonase  nasal spray and saline nasal spray for seasonal allergies.  6. Hyperlipidemia: - He is currently taking atorvastatin  40 mg daily. - Blood work will be  conducted today to monitor cholesterol levels.  7. Gastroesophageal Reflux Disease (GERD): - He is currently on pantoprazole for GERD management. - No other bleeding has been noticed since the acute GI bleeding episode earlier this fall.  8. Medication Management: - A prescription refill for Eliquis 5 mg twice daily and Jardiance 10 mg daily will be provided today.  9. Health Maintenance: - He is up to date on all vaccinations including tetanus, influenza, COVID-19, pneumonia, RSV, and shingles vaccines. - Blood work will be conducted today as part of his annual physical.  10. Neck Spasm: - He reports neck pain likely due to sleeping in a chair. - Non-pharmacological treatments such as heat massage or a heating pad are recommended. - Avoid muscle relaxers due to potential drowsiness.  11. Toenail  Care: - His toenails are very long and thick - they need to be cut by a specialist. - A referral to [facility] will be made for further evaluation and management.  Follow-up: The patient will follow up in 6 months.  1. Encounter for Medicare annual wellness exam (Primary)  2. Encounter for annual physical exam - CBC with Differential - Lipid Panel - Comprehensive Metabolic Panel  3. Acute deep vein thrombosis (DVT) of left peroneal vein    (CMD) - apixaban (ELIQUIS) 5 mg tab; Take 1 tablet (5 mg total) by mouth 2 (two) times a day.  Dispense: 180 tablet; Refill: 0 - CBC with Differential - Comprehensive Metabolic Panel  4. Chronic diastolic congestive heart failure    (CMD) - empagliflozin (JARDIANCE) 10 mg tab; Take 1 tablet (10 mg total) by mouth daily. Added per [facility] dc summary  Dispense: 90 tablet; Refill: 1 - CBC with Differential - Comprehensive Metabolic Panel  5. Bilateral lower extremity edema - empagliflozin (JARDIANCE) 10 mg tab; Take 1 tablet (10 mg total) by mouth daily. Added per [facility] dc summary  Dispense: 90 tablet; Refill: 1 - apixaban (ELIQUIS) 5 mg tab; Take 1 tablet (5 mg total) by mouth 2 (two) times a day.  Dispense: 180 tablet; Refill: 0 - CBC with Differential - Comprehensive Metabolic Panel  6. Thickening of toenail - Ambulatory referral to Podiatry; Future  7. Chronic obstructive pulmonary disease, unspecified COPD type - CBC with Differential - Comprehensive Metabolic Panel  8. Coronary artery disease involving native coronary artery of native heart without angina pectoris - CBC with Differential - Comprehensive Metabolic Panel  9. Essential hypertension - CBC with Differential - Comprehensive Metabolic Panel  10. Interstitial pulmonary disease (HCC) - CBC with Differential - Comprehensive Metabolic Panel  11. Seasonal allergic rhinitis, unspecified trigger  12. Rosacea  13. Bilateral hearing loss, unspecified hearing loss  type  14. Hyperlipidemia, unspecified hyperlipidemia type - Lipid Panel   No follow-ups on file.   There are no Patient Instructions on file for this visit.   Please contact my office for worsening conditions or problems, and seek emergency medical treatment and/or call 911 if you or your family deems either necessary.  This document was created with the assistance of Dragon voice recognition software.  I have personally spent 40 minutes (in addition to AWV and CPE) involved in face-to-face and non-face-to-face activities for this patient on the day of the visit.  Professional time spent includes the following activities, in addition to those noted in the documentation:  - preparing to see the patient (e.g., review of recent and/or remote lab/imaging/study results, provider notes, and patient messages/phone calls available in current EMR, CareEverywhere,  and scanned records) -obtaining and/or reviewing separately obtained history either through past provider notes, patient phone calls, and/or patient's family member(s)/caregiver(s) -performing a medically appropriate examination and/or evaluation -counseling and educating the patient/family/caregiver -ordering medications, tests, or procedures -documenting clinical information in the electronic or other health record -reviewing most up to date studies or expert consensus guidelines for screening/diagnosing/treating pertinent conditions/symptoms -independently interpreting results (not separately reported) and communicating results to the patient/family/caregiver -care coordination (not separately reported) -referring and communicating with other health care professionals (when not separately reported)  Electronically signed by: Bernardino JAYSON Level, FNP 06/04/2024 8:23 AM  [facility] Medicare Wellness Visit Type:: Subsequent Annual Wellness Visit  Name: Jeffrey Stewart Date of  Birth: 1935/05/11 Age: 89 y.o. 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/significant other Does the patient have a support system (family, friend, church, conservation officer, nature, etc)?: Yes   Do you have any dental concerns?: No In the past month, have you experienced a change in your bladder control?: No   Do you have any difficulty obtaining your medications?: No   Do you have trouble consistently taking or remembering to take all of your medications as prescribed?: No Patient rates overall stress level as: Some Does stress affect daily life?: No Typical amount of pain: (!) a lot Does pain affect daily life?: No Are you currently prescribed opioids?: No                Depression Screening  Behavioral Health Screening  Patient Health Questionnaire-2 Score: 0 (06/04/2024  7:53 AM)  Patient Health Questionnaire-9 Score: 0 (06/04/2024  7:53 AM)   PHQ Question # 9 Thoughts that you would be better off dead or hurting yourself in some way: Not at all   Patient's Depression screening/score = Negative        Social History (Tobacco/Drugs/Sexual Activity) Kobie reports that he has never smoked. He has never used smokeless tobacco. Tobacco Use?: No How many times in the past year have you used a recreational drug or used a prescription medication for nonmedical reasons?: None Risk factors for sexually transmitted infections (i.e., multiple sexual partners): No Are you bothered by sexual problems?: No  Alcohol  Screening How often do you have a drink containing alcohol ?: Never How many standard drinks containing alcohol  do you have on a typical day?: Never, 1 or 2 drinks How often do you have six or more drinks on one occasion?: Never Audit-C Score: 0  Physical Activity Regular  exercise?: (!) No      Diet How many meals a day?: 3 Eats fruit and vegetables daily?: Yes Most meals are obtained by: preparing their own meals, eating out  Home and Transportation Safety All rugs have non-skid backing?: Yes All stairs or steps have railings?: Yes Grab bars in the bathtub or shower?: Yes Have non-skid surface in bathtub or shower?: Yes Good home lighting?: Yes Regular seat belt use?: Yes      Activities of Daily Living Feed self?: Yes Bathe self?: Yes Dress self?: Yes Use toilet without assistance?: Yes Walk without assistance?: (!) No, needs assistance from Walking assistance provided by: walker  Instrumental Activities of Daily Living Manage finances?: Yes Shop for themselves?: Yes Prepare meals?: Yes Use the telephone?: Yes Manage medications?: Yes   Performs basic housework/laundry?: Yes Drives?: No Primary transportation is: family  or friends  Hearing Concerns about hearing?: No Uses hearing aids?: No Hear whispered voice? (Observed): Yes  Vision Concerns about vision?: No    Fall Risk Is the patient ambulatory?: Yes One or more falls in the last year:: Yes Feels unsteady when walking:: No  Cognitive Assessment Has a diagnosis of dementia or cognitive impairment?: Yes                Advance Directives Living will?: Yes Advance directive information provided to patient: No Healthcare POA?: Yes Name of Health Care Agent: sherrie swain Relationship to patient: Adult child           Other History I reviewed and updated the following risk factors and conditions as appropriate: Reviewed/Updated: Problem List, Medical History, Surgical History, Family History, Medications, Allergies Reviewed/Updated: Vital Signs (height, weight, and BP), Immunizations, Health Maintenance    Vital Signs BP (!) 109/53   Pulse 80   Temp 98.6 F (37 C)   Resp 17   Ht 1.829 m (6')   Wt 84.7 kg (186 lb 12.8 oz)   SpO2 96%   BMI 25.33  kg/m   Screening and Immunizations Health Maintenance Status       Date Due Completion Dates   DTaP/Tdap/Td Vaccines (1 - Tdap) 06/04/2025 (Originally 07/20/2020) 07/19/2020, 01/04/2006   COVID-19 Vaccine (6 - 2025-26 season) 06/04/2025 (Originally 03/17/2024) 05/29/2022, 05/22/2020   Diabetes Screening 05/19/2025 05/19/2024, 04/30/2024   Comprehensive Annual Visit 06/04/2025 06/04/2024, 06/01/2023   Depression Screening 06/04/2025 06/04/2024       Immunization History  Administered Date(s) Administered  . Influenza Vaccine, Quadrivalent, Adjuvanted 04/28/2020  . Influenza, High-dose Seasonal, Quadrivalent, Preservative Free 04/18/2016, 04/27/2017, 04/22/2018, 04/17/2019, 04/22/2020, 06/16/2021, 04/12/2022  . Influenza, Unspecified 04/21/2014  . Influenza, high-dose, trivalent, PF 04/18/2016, 04/27/2017, 04/22/2018, 04/17/2019, 04/22/2020, 06/16/2021, 04/12/2022, 03/12/2023, 05/19/2024  . Influenza, split virus, trivalent, preservative 04/21/2014, 04/30/2020  . Moderna Covid-19, mRNA,LNP-S,PF 12+ Yrs 05/29/2022  . Moderna SARS-CoV-2 Primary Series 12+ yrs 08/08/2019, 09/08/2019, 05/22/2020  . Novel Influenza H1n1-09 06/22/2008  . Pfizer SARS-CoV-2 Primary Series 12+ yrs 12/31/2019  . Pneumococcal Conjugate 13-Valent 11/13/2013  . Pneumococcal Polysaccharide Vaccine, 23 Valent (PNEUMOVAX-23) 2Y+ 09/06/2011  . RSV recombinant,PF(AREXVY)50Y+, not Medicare 05/29/2022  . TD vaccine (ADULT)PF(TENIVAC) 7Y+ 01/04/2006, 07/19/2020  . Td (Adult), Unspecified 01/04/2006  . Varicella Zoster [facility]) 18Y+ 05/12/2020, 07/19/2020  . Zoster, Live 04/05/2011  . Zoster, Unspecified 05/12/2020, 07/19/2020    Assessment/Plan: Subsequent Annual Wellness Visit: The topics above were reviewed with the patient.  Healthy lifestyle principles reviewed.  Recommendations provided when indicated.  Follow up 1 year for next wellness visit.  Orders Placed This Encounter  Procedures  . Ambulatory referral to  Podiatry    New Medications Ordered This Visit  Medications  . empagliflozin (JARDIANCE) 10 mg tab    Sig: Take 1 tablet (10 mg total) by mouth daily. Added per [facility] dc summary    Dispense:  90 tablet    Refill:  1  . apixaban (ELIQUIS) 5 mg tab    Sig: Take 1 tablet (5 mg total) by mouth 2 (two) times a day.    Dispense:  180 tablet    Refill:  0    Patient Care Team: Bernardino JAYSON Level, FNP as PCP - General (Nurse Practitioner) Bernardino JAYSON Level, FNP as PCP - Attributed  Electronically signed by: Bernardino JAYSON Level, FNP 06/04/2024 8:54 AM       [1] Past Medical History: Diagnosis Date  . Allergic rhinitis   . Allergy   .  Arthritis   . CAD (coronary artery disease)   . CHF (congestive heart failure)    (CMD)   . COPD (chronic obstructive pulmonary disease)   . Hearing loss   . Hypercholesteremia   . Hypercholesterolemia   . RBBB   . Rosacea   [2] Current Outpatient Medications on File Prior to Visit  Medication Sig Dispense Refill  . albuterol  HFA (PROVENTIL  HFA;VENTOLIN  HFA;PROAIR  HFA) 90 mcg/actuation inhaler INHALE 2 PUFFS INTO THE LUNGS EVERY 6 HOURS AS NEEDED FOR WHEEZE 20.1 each 1  . apixaban (ELIQUIS) 5 mg tab Take 5 mg by mouth 2 (two) times a day. [facility] instructions read: take 2 tablets (10 mg total) BID for 2 days then 1 tablet BID    . aspirin  81 mg EC tablet Take one tablet daily    . atorvastatin  (LIPITOR) 40 mg tablet     . brimonidine  (ALPHAGAN ) 0.2 % ophthalmic solution 1 drop in the morning and 1 drop in the evening.    . carvediloL  (COREG ) 12.5 mg tablet     . carvediloL  (COREG ) 6.25 mg tablet Take 3.125 mg by mouth.    . cetirizine (ZyrTEC) 10 mg tablet Take 10 mg by mouth daily.    . cholecalciferol  (VITAMIN D3) 1,000 unit (25 mcg) tablet Take 1,000 Units by mouth daily.    SABRA doxycycline (VIBRAMYCIN) 100 mg capsule Take 100 mg by mouth 2 (two) times a day. Take with 8 oz water. Do not lie down for at least 30 minutes after. Added per [facility] dc summary  04/21/24    . empagliflozin (JARDIANCE) 10 mg tab Take 10 mg by mouth daily. Added per [facility] dc summary    . ferrous sulfate 325 mg (65 mg iron) tablet Take 325 mg by mouth daily with breakfast.    . fluticasone  propion-salmeteroL (ADVAIR DISKUS) 100-50 mcg/dose diskus inhaler INHALE 1 PUFF IN THE MORNING AND 1 PUFF BEFORE BEDTIME 1 each 5  . fluticasone  propionate (FLONASE ) 50 mcg/spray nasal spray USE 1 TO 2 SPRAYS IN EACH NOSTRIL EVERY DAY 48 g 3  . glucosamine-chondroitin (CIDAFLEX) 500-400 mg tab per tablet Take 1 tablet by mouth 3 (three) times a day.    . ipratropium-albuteroL  (DUO-NEB) 0.5-2.5 mg/3 mL nebulizer solution INHALE 3 MLS BY NEBULIZATION EVERY 6 HOURS AS NEEDED FOR WHEEZE OR FOR SHORTNESS OF BREATH 180 mL 0  . latanoprost  (XALATAN ) 0.005 % ophthalmic solution Administer 1 drop into both eyes nightly.    . lisinopriL  (PRINIVIL ) 5 mg tablet Take 2.5 mg by mouth daily.    SABRA loperamide (IMODIUM) 2 mg capsule Take 2 mg by mouth.    . metroNIDAZOLE  (METROCREAM ) 0.75 % cream APPLY TOPICALLY 2 (TWO) TIMES A DAY. 135 g 0  . miscellaneous medical supply (C-Tub) misc Needs left surgical shoe for M25.572 and R60.0 1 each 0  . miscellaneous medical supply (C-Tub) misc Needs nebulizer machine with mask, tubing, and other supplies for COPD, bronchitis, and ILD (ICD-10: J44.9, J20.9 J84.9) 1 each 0  . mycophenolate  (CELLCEPT ) 500 mg tablet     . neomycin -polymyxin-HC (CORTISPORIN) 3.5-10,000-1 mg/mL-unit/mL-% otic solution 3 drops 4 (four) times a day.    . nitroglycerin  (NITROSTAT ) 0.4 mg SL tablet Place  under the tongue every 5 (five) minutes as needed for chest pain.    SABRA omega 3-dha-epa-fish oil (OMEGA 3) 1,000 mg capsule Take 1 g by mouth daily.    . pantoprazole (PROTONIX) 40 mg EC tablet Take 40 mg by mouth 2 (two) times a  day. Added per [facility] dc summary 04/21/24    . potassium chloride  (KLOR-CON ) 20 mEq ER tablet     . predniSONE  (DELTASONE ) 1 mg tablet Take 6 mg by mouth daily.    .  predniSONE  (DELTASONE ) 10 mg tablet Take 10 mg by mouth daily. 5 tablets (50 mg) daily with breakfast and decrease by 1 tablet daily Added per [facility] summary 04/21/24    . predniSONE  (DELTASONE ) 5 mg tablet Take 5 mg by mouth daily with breakfast. Restart after finished with Prednisone  taper Added per [facility] summary 04/21/24    . promethazine -dextromethorphan  (PHENERGAN  DM) 6.25-15 mg/5 mL syrp syrup Take 5 mL by mouth every 6 (six) hours as needed (for cough). 118 mL 0  . sodium chloride  (AYR) 0.65 % drop nasal drops 1 drop.    SABRA spironolactone (ALDACTONE) 25 mg tablet Take 25 mg by mouth daily. Added per [facility] summary    . torsemide  (DEMADEX ) 20 mg tablet Take 20 mg by mouth daily.    SABRA turm-ging-bos-yuc-wil-cham-hor (Tumersaid) 100-100-100-125 mg tab Take by mouth.    . ZyrTEC 10 mg cap     . sucralfate (CARAFATE) 100 mg/mL oral suspension Take 1 g by mouth 4 (four) times a day with meals and nightly. Added per [facility] summary 04/21/24 (Patient not taking: Reported on 06/04/2024)     No current facility-administered medications on file prior to visit.  [3]  Current Outpatient Medications:  .  albuterol  HFA (PROVENTIL  HFA;VENTOLIN  HFA;PROAIR  HFA) 90 mcg/actuation inhaler, INHALE 2 PUFFS INTO THE LUNGS EVERY 6 HOURS AS NEEDED FOR WHEEZE, Disp: 20.1 each, Rfl: 1 .  apixaban (ELIQUIS) 5 mg tab, Take 5 mg by mouth 2 (two) times a day. [facility] instructions read: take 2 tablets (10 mg total) BID for 2 days then 1 tablet BID, Disp: , Rfl:  .  aspirin  81 mg EC tablet, Take one tablet daily, Disp: , Rfl:  .  atorvastatin  (LIPITOR) 40 mg tablet, , Disp: , Rfl:  .  brimonidine  (ALPHAGAN ) 0.2 % ophthalmic solution, 1 drop in the morning and 1 drop in the evening., Disp: , Rfl:  .  carvediloL  (COREG ) 12.5 mg tablet, , Disp: , Rfl:  .  carvediloL  (COREG ) 6.25 mg tablet, Take 3.125 mg by mouth., Disp: , Rfl:  .  cetirizine (ZyrTEC) 10 mg tablet, Take 10 mg by mouth daily., Disp: , Rfl:  .  cholecalciferol   (VITAMIN D3) 1,000 unit (25 mcg) tablet, Take 1,000 Units by mouth daily., Disp: , Rfl:  .  doxycycline (VIBRAMYCIN) 100 mg capsule, Take 100 mg by mouth 2 (two) times a day. Take with 8 oz water. Do not lie down for at least 30 minutes after. Added per [facility] summary 04/21/24, Disp: , Rfl:  .  empagliflozin (JARDIANCE) 10 mg tab, Take 10 mg by mouth daily. Added per [facility] summary, Disp: , Rfl:  .  ferrous sulfate 325 mg (65 mg iron) tablet, Take 325 mg by mouth daily with breakfast., Disp: , Rfl:  .  fluticasone  propion-salmeteroL (ADVAIR DISKUS) 100-50 mcg/dose diskus inhaler, INHALE 1 PUFF IN THE MORNING AND 1 PUFF BEFORE BEDTIME, Disp: 1 each, Rfl: 5 .  fluticasone  propionate (FLONASE ) 50 mcg/spray nasal spray, USE 1 TO 2 SPRAYS IN EACH NOSTRIL EVERY DAY, Disp: 48 g, Rfl: 3 .  glucosamine-chondroitin (CIDAFLEX) 500-400 mg tab per tablet, Take 1 tablet by mouth 3 (three) times a day., Disp: , Rfl:  .  ipratropium-albuteroL  (DUO-NEB) 0.5-2.5 mg/3 mL nebulizer  solution, INHALE 3 MLS BY NEBULIZATION EVERY 6 HOURS AS NEEDED FOR WHEEZE OR FOR SHORTNESS OF BREATH, Disp: 180 mL, Rfl: 0 .  latanoprost  (XALATAN ) 0.005 % ophthalmic solution, Administer 1 drop into both eyes nightly., Disp: , Rfl:  .  lisinopriL  (PRINIVIL ) 5 mg tablet, Take 2.5 mg by mouth daily., Disp: , Rfl:  .  loperamide (IMODIUM) 2 mg capsule, Take 2 mg by mouth., Disp: , Rfl:  .  metroNIDAZOLE  (METROCREAM ) 0.75 % cream, APPLY TOPICALLY 2 (TWO) TIMES A DAY., Disp: 135 g, Rfl: 0 .  miscellaneous medical supply (C-Tub) misc, Needs left surgical shoe for M25.572 and R60.0, Disp: 1 each, Rfl: 0 .  miscellaneous medical supply (C-Tub) misc, Needs nebulizer machine with mask, tubing, and other supplies for COPD, bronchitis, and ILD (ICD-10: J44.9, J20.9 J84.9), Disp: 1 each, Rfl: 0 .  mycophenolate  (CELLCEPT ) 500 mg tablet, , Disp: , Rfl:  .  neomycin -polymyxin-HC (CORTISPORIN) 3.5-10,000-1 mg/mL-unit/mL-% otic solution, 3 drops 4 (four)  times a day., Disp: , Rfl:  .  nitroglycerin  (NITROSTAT ) 0.4 mg SL tablet, Place  under the tongue every 5 (five) minutes as needed for chest pain., Disp: , Rfl:  .  omega 3-dha-epa-fish oil (OMEGA 3) 1,000 mg capsule, Take 1 g by mouth daily., Disp: , Rfl:  .  pantoprazole (PROTONIX) 40 mg EC tablet, Take 40 mg by mouth 2 (two) times a day. Added per [facility] summary 04/21/24, Disp: , Rfl:  .  potassium chloride  (KLOR-CON ) 20 mEq ER tablet, , Disp: , Rfl:  .  predniSONE  (DELTASONE ) 1 mg tablet, Take 6 mg by mouth daily., Disp: , Rfl:  .  predniSONE  (DELTASONE ) 10 mg tablet, Take 10 mg by mouth daily. 5 tablets (50 mg) daily with breakfast and decrease by 1 tablet daily Added per [facility] summary 04/21/24, Disp: , Rfl:  .  predniSONE  (DELTASONE ) 5 mg tablet, Take 5 mg by mouth daily with breakfast. Restart after finished with Prednisone  taper Added per [facility] summary 04/21/24, Disp: , Rfl:  .  promethazine -dextromethorphan  (PHENERGAN  DM) 6.25-15 mg/5 mL syrp syrup, Take 5 mL by mouth every 6 (six) hours as needed (for cough)., Disp: 118 mL, Rfl: 0 .  sodium chloride  (AYR) 0.65 % drop nasal drops, 1 drop., Disp: , Rfl:  .  spironolactone (ALDACTONE) 25 mg tablet, Take 25 mg by mouth daily. Added per [facility] summary, Disp: , Rfl:  .  torsemide  (DEMADEX ) 20 mg tablet, Take 20 mg by mouth daily., Disp: , Rfl:  .  turm-ging-bos-yuc-wil-cham-hor (Tumersaid) 100-100-100-125 mg tab, Take by mouth., Disp: , Rfl:  .  ZyrTEC 10 mg cap, , Disp: , Rfl:  .  sucralfate (CARAFATE) 100 mg/mL oral suspension, Take 1 g by mouth 4 (four) times a day with meals and nightly. Added per [facility] summary 04/21/24 (Patient not taking: Reported on 06/04/2024), Disp: , Rfl:  [4] Allergies Allergen Reactions  . Niacin  Other (See Comments) and Flushing    Intolerance  Flush feeling WHEN ON LARGE DOSE - IS ABLE TO TAKE SMALLER DOSAGE WITHOUT PROB, I  . Meloxicam Other (See Comments)    Disorientation   [5] Past Surgical  History: Procedure Laterality Date  . APPENDECTOMY     Procedure: APPENDECTOMY  . EYE SURGERY     Procedure: EYE SURGERY  . HIP SURGERY    . MOUTH SURGERY      Procedure: MOUTH SURGERY; tooth extraction  [6] Family History Problem Relation Name Age of Onset  . Asthma  Sister    . Asthma Brother    . COPD Mother    [7] Social History Socioeconomic History  . Marital status: Married  Tobacco Use  . Smoking status: Never  . Smokeless tobacco: Never  Vaping Use  . Vaping status: Never Used  Substance and Sexual Activity  . Alcohol  use: Yes  . Drug use: Never   Social Drivers of Health   Food Insecurity: Low Risk  (06/04/2024)   Food vital sign   . Within the past 12 months, you worried that your food would run out before you got money to buy more: Never true   . Within the past 12 months, the food you bought just didn't last and you didn't have money to get more: Never true  Transportation Needs: No Transportation Needs (06/04/2024)   Transportation   . In the past 12 months, has lack of reliable transportation kept you from medical appointments, meetings, work or from getting things needed for daily living? : No  Safety: Low Risk  (06/04/2024)   Safety   . How often does anyone, including family and friends, physically hurt you?: Never   . How often does anyone, including family and friends, insult or talk down to you?: Never   . How often does anyone, including family and friends, threaten you with harm?: Never   . How often does anyone, including family and friends, scream or curse at you?: Never  Living Situation: Low Risk  (06/04/2024)   Living Situation   . What is your living situation today?: I have a steady place to live   . Think about the place you live. Do you have problems with any of the following? Choose all that apply:: None/None on this list

## 2024-06-04 NOTE — Patient Instructions (Signed)
 I am happy to hear that you are doing better.  Continue on pantoprazole twice daily.  Continue oral iron.  We will delay your upper endoscopy given recent blood clot and your blood thinner.  Follow-up in 3 months or sooner if needed.  It was very nice seeing you all today.  Dr. Cindie

## 2024-06-04 NOTE — Progress Notes (Signed)
 Referring Provider: Anita Bernardino [name], FNP Primary Care Physician:  Anita Bernardino [name], FNP Primary GI:  Dr. Cindie  Chief Complaint  Patient presents with   Follow-up    Patient here today due to a recent hospital visit to follow up on GI bleed. Patient denies any current gi related issues, nor has he seen any dark or bloody stools.     HPI:   Jeffrey Stewart is a 89 y.o. male who presents to clinic today for hospital follow-up visit.  He has a history of chronic heart failure, CAD, dyslipidemia, ILD, polymyalgia rheumatica and RA chronically on 6 mg of prednisone  daily and CellCept .  Admitted to [facility] 04/14/2024 for hematemesis and acute blood loss anemia 1 week after mechanical fall at home suffering right intertrochanteric femur fracture status post ORIF with rod placement 04/07/2024.  Initial hemoglobin found to be 6.3.  CT abdomen pelvis largely unremarkable besides diarrheal state.  EGD 04/15/2024 with 3 nonbleeding gastric ulcers (largest 20 mm) with a flat pigmented spot, Forrest class IIc s/p injection and treated with bipolar cautery and was biopsied (slight chronic inflammation with focal intestinal metaplasia, negative H. Pylori).   Discharged on PPI twice daily.  Presented to ER at [facility] 05/06/2024 with leg swelling, found to have acute left lower extremity DVT.  Transferred to [facility] for [facility].  Initially started on heparin , transitioned to Eliquis .  Hemoglobin remained stable during hospitalization.  Discharged 05/11/2024.  CBC from 05/19/2024 hemoglobin 10.5.  Today, states he is doing well.  Denies any gross melena or hematochezia.  Taking pantoprazole  40 mg twice daily.  Avoiding NSAIDs.  On oral iron therapy.  Past Medical History:  Diagnosis Date   Allergy    Arthritis    MINOR ARTHRITIS FEET AND TOES   Asthma    Cataract    CHF (congestive heart failure) (HCC) 11/14/2009   EF45-50%   Coronary artery disease    Essential  hypertension 05/07/2024   Glaucoma (increased eye pressure) 04/06/2024   Hyperlipidemia    Myocardial infarction (HCC)    Pneumonia    HX OF PNEUMONIA 4 OR 5 YRS AGO   Rosacea    OF FACE   Shortness of breath    SOMETIMES SOB OR WHEEZING WITH EXERTION   Symptomatic anemia 04/18/2024    Past Surgical History:  Procedure Laterality Date   APPENDECTOMY     AGE 60   CARDIAC CATHETERIZATION  11/2009   mild / mod cad,MODERATE TIGHT STENOSIS IN THE DISTAL LEFT CIRCUMFLEX ARTERY   ELECTROCARDIOGRAM  11/2009   RBBB   ESOPHAGOGASTRODUODENOSCOPY N/A 04/15/2024   Procedure: EGD (ESOPHAGOGASTRODUODENOSCOPY);  Surgeon: Eartha Flavors, Toribio, MD;  Location: [facility];  Service: Gastroenterology;  Laterality: N/A;   EYE SURGERY     BILATERAL CATARACT EXTRACT EXTRACTION WITH IMPLANTS   INGUINAL HERNIA REPAIR Bilateral 09/29/2013   Procedure: LAPAROSCOPIC BILATERAL  INGUINAL HERNIA;  Surgeon: Elon CHRISTELLA Pacini, MD;  Location: [facility];  Service: General;  Laterality: Bilateral;   INSERTION OF MESH Bilateral 09/29/2013   Procedure: INSERTION OF MESH;  Surgeon: Elon CHRISTELLA Pacini, MD;  Location: [facility];  Service: General;  Laterality: Bilateral;   INTRAMEDULLARY (IM) NAIL INTERTROCHANTERIC Right 04/07/2024   Procedure: FIXATION, FRACTURE, INTERTROCHANTERIC, WITH INTRAMEDULLARY ROD;  Surgeon: Georgina Ozell DELENA, MD;  Location: [facility] OR;  Service: Orthopedics;  Laterality: Right;   last echo 02/2009     last nuc 2009  12/16/2007   EF 33%. ABNORMAL. HE  HAS APICAL DEFECT CONSISTENT WITH A SCAR.LV FUNCTION MODERATELY  DEPRESSED   US  ECHOCARDIOGRAPHY  02/23/2009   EF 45-50%   US  ECHOCARDIOGRAPHY  12/27/2007   EF 45-50%    Current Outpatient Medications  Medication Sig Dispense Refill   albuterol  (VENTOLIN  HFA) 108 (90 Base) MCG/ACT inhaler Inhale 2 puffs into the lungs every 4 (four) hours as needed for wheezing or shortness of breath. 1 each 1   apixaban (ELIQUIS) 5 MG TABS tablet Take 2 tablets (10 mg total) by  mouth 2 (two) times daily for 2 days, THEN 1 tablet (5 mg total) 2 (two) times daily. (Patient taking differently: 5 mg bid) 120 tablet 0   atorvastatin  (LIPITOR) 40 MG tablet Take 1 tablet (40 mg total) by mouth at bedtime. 90 tablet 2   brimonidine  (ALPHAGAN ) 0.2 % ophthalmic solution Place 1 drop into both eyes 2 (two) times daily.     carvedilol  (COREG ) 6.25 MG tablet Take 1 tablet (6.25 mg total) by mouth 2 (two) times daily with a meal. 180 tablet 2   cetirizine (ZYRTEC) 10 MG tablet Take 10 mg by mouth at bedtime.     Cholecalciferol  (VITAMIN D3) 1000 units CAPS Take 1,000 Units by mouth daily.     empagliflozin (JARDIANCE) 10 MG TABS tablet Take 1 tablet (10 mg total) by mouth daily. 30 tablet 0   ferrous sulfate 325 (65 FE) MG tablet Take 1 tablet (325 mg total) by mouth daily with breakfast. 60 tablet 1   fish oil-omega-3 fatty acids  1000 MG capsule Take 1 g by mouth 2 (two) times daily.     fluticasone  (FLONASE ) 50 MCG/ACT nasal spray Place 1-2 sprays into both nostrils daily as needed for allergies or rhinitis.     fluticasone -salmeterol (ADVAIR) 100-50 MCG/ACT AEPB Inhale 1 puff into the lungs 2 (two) times daily.     glucosamine-chondroitin 500-400 MG tablet Take 1 tablet by mouth at bedtime.     ipratropium-albuterol  (DUONEB) 0.5-2.5 (3) MG/3ML SOLN Take 3 mLs by nebulization every 6 (six) hours as needed (wheezing, shortness of breath).     latanoprost  (XALATAN ) 0.005 % ophthalmic solution Place 1 drop into both eyes at bedtime.     loperamide (IMODIUM) 2 MG capsule Take 2 mg by mouth 2 (two) times daily as needed for diarrhea or loose stools.     metroNIDAZOLE  (METROCREAM ) 0.75 % cream Apply 1 application  topically 2 (two) times daily as needed (for rosacea).     mycophenolate  (CELLCEPT ) 500 MG tablet TAKE 1 TABLET BY MOUTH TWICE A DAY 180 tablet 1   NEOMYCIN -POLYMYXIN-HYDROCORTISONE  (CORTISPORIN) 1 % SOLN OTIC solution Place 1 drop into both ears as needed (when ears are stopped  up).     pantoprazole (PROTONIX) 40 MG tablet Take 1 tablet (40 mg total) by mouth 2 (two) times daily. 60 tablet 1   potassium chloride  SA (KLOR-CON  M) 20 MEQ tablet Take 1 tablet (20 mEq total) by mouth daily. 90 tablet 3   predniSONE  (DELTASONE ) 1 MG tablet Take 1 tablet (1 mg total) by mouth at bedtime. Restart after finished with prednisone  taper     predniSONE  (DELTASONE ) 5 MG tablet Take 1 tablet (5 mg total) by mouth daily with breakfast. Restart after finished with prednisone  taper     sodium chloride  (OCEAN) 0.65 % nasal spray Place 1 spray into the nose as needed for congestion.     spironolactone (ALDACTONE) 25 MG tablet Take 1 tablet (25 mg total) by mouth daily. 60 tablet  0   torsemide  (DEMADEX ) 20 MG tablet Take 2 tablets (40 mg total) by mouth daily. 120 tablet 0   No current facility-administered medications for this visit.    Allergies as of 06/04/2024 - Review Complete 06/04/2024  Allergen Reaction Noted   Niaspan  [niacin ] Other (See Comments) 05/08/2011   Meloxicam Other (See Comments) 12/20/2020    Family History  Problem Relation Age of Onset   Diabetes Sister    Lung cancer Sister        smoker    Social History   Socioeconomic History   Marital status: Married    Spouse name: Not on file   Number of children: Not on file   Years of education: Not on file   Highest education level: Not on file  Occupational History   Not on file  Tobacco Use   Smoking status: Never   Smokeless tobacco: Never  Vaping Use   Vaping status: Never Used  Substance and Sexual Activity   Alcohol  use: Not Currently    Comment: 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   Drug use: No   Sexual activity: Not on file  Other Topics Concern   Not on file  Social History Narrative   Not on file   Social Drivers of Health   Financial Resource Strain: Not on file  Food Insecurity: Low Risk  (06/04/2024)   Received from [facility]   Hunger Vital Sign    Within the past 12 months, you  worried that your food would run out before you got money to buy more: Never true    Within the past 12 months, the food you bought just didn't last and you didn't have money to get more. : Never true  Transportation Needs: No Transportation Needs (06/04/2024)   Received from [facility]    In the past 12 months, has lack of reliable transportation kept you from medical appointments, meetings, work or from getting things needed for daily living? : No  Physical Activity: Not on file  Stress: Not on file  Social Connections: Moderately Integrated (05/06/2024)   Social Connection and Isolation Panel    Frequency of Communication with Friends and Family: More than three times a week    Frequency of Social Gatherings with Friends and Family: More than three times a week    Attends Religious Services: 1 to 4 times per year    Active Member of [employer] or Organizations: No    Attends Banker Meetings: Never    Marital Status: Married    Subjective: Review of Systems  Constitutional:  Negative for chills and fever.  HENT:  Negative for congestion and hearing loss.   Eyes:  Negative for blurred vision and double vision.  Respiratory:  Negative for cough and shortness of breath.   Cardiovascular:  Negative for chest pain and palpitations.  Gastrointestinal:  Negative for abdominal pain, blood in stool, constipation, diarrhea, heartburn, melena and vomiting.  Genitourinary:  Negative for dysuria and urgency.  Musculoskeletal:  Negative for joint pain and myalgias.  Skin:  Negative for itching and rash.  Neurological:  Negative for dizziness and headaches.  Psychiatric/Behavioral:  Negative for depression. The patient is not nervous/anxious.      Objective: BP 103/61 (BP Location: Left Arm, Patient Position: Sitting, Cuff Size: Normal)   Pulse 98   Temp (!) 97.5 F (36.4 C) (Temporal)   Ht 6' (1.829 m)   Wt 189 lb 3.2 oz (85.8 kg)  BMI 25.66 kg/m  Physical  Exam Constitutional:      Appearance: Normal appearance.  HENT:     Head: Normocephalic and atraumatic.  Eyes:     Extraocular Movements: Extraocular movements intact.     Conjunctiva/sclera: Conjunctivae normal.  Cardiovascular:     Rate and Rhythm: Normal rate and regular rhythm.  Pulmonary:     Effort: Pulmonary effort is normal.     Breath sounds: Normal breath sounds.  Abdominal:     General: Bowel sounds are normal.     Palpations: Abdomen is soft.  Musculoskeletal:        General: Normal range of motion.     Cervical back: Normal range of motion and neck supple.  Skin:    General: Skin is warm.     Findings: Bruising present.  Neurological:     General: No focal deficit present.     Mental Status: He is alert and oriented to person, place, and time.  Psychiatric:        Mood and Affect: Mood normal.        Behavior: Behavior normal.      Assessment: *Multiple gastric ulcers *Normocytic anemia *Chronic anticoagulation with Eliquis *Left lower extremity DVT  Plan: Discussed in depth with patient and family today.  We will delay his EGD to reevaluate his peptic ulcer disease given newly diagnosed DVT as well as the need for anticoagulation x3-6 months.  Continue on pantoprazole 40 mg twice daily.  Continue on oral iron.  Avoid all NSAIDs.  Follow-up in 3 months.  We can discuss scheduling an EGD at that time.    06/04/2024 1:17 PM

## 2024-06-11 ENCOUNTER — Inpatient Hospital Stay: Attending: Hematology and Oncology

## 2024-06-11 ENCOUNTER — Other Ambulatory Visit: Payer: Self-pay | Admitting: Hematology and Oncology

## 2024-06-11 ENCOUNTER — Inpatient Hospital Stay: Admitting: Hematology and Oncology

## 2024-06-11 VITALS — BP 109/71 | HR 73 | Temp 97.7°F | Resp 14 | Wt 185.4 lb

## 2024-06-11 DIAGNOSIS — R600 Localized edema: Secondary | ICD-10-CM | POA: Insufficient documentation

## 2024-06-11 DIAGNOSIS — D7589 Other specified diseases of blood and blood-forming organs: Secondary | ICD-10-CM

## 2024-06-11 DIAGNOSIS — I82402 Acute embolism and thrombosis of unspecified deep veins of left lower extremity: Secondary | ICD-10-CM

## 2024-06-11 DIAGNOSIS — D649 Anemia, unspecified: Secondary | ICD-10-CM

## 2024-06-11 DIAGNOSIS — D72829 Elevated white blood cell count, unspecified: Secondary | ICD-10-CM | POA: Diagnosis not present

## 2024-06-11 DIAGNOSIS — Z801 Family history of malignant neoplasm of trachea, bronchus and lung: Secondary | ICD-10-CM | POA: Insufficient documentation

## 2024-06-11 DIAGNOSIS — D539 Nutritional anemia, unspecified: Secondary | ICD-10-CM

## 2024-06-11 LAB — CMP (CANCER CENTER ONLY)
ALT: 9 U/L (ref 0–44)
AST: 18 U/L (ref 15–41)
Albumin: 3.9 g/dL (ref 3.5–5.0)
Alkaline Phosphatase: 88 U/L (ref 38–126)
Anion gap: 9 (ref 5–15)
BUN: 40 mg/dL — ABNORMAL HIGH (ref 8–23)
CO2: 28 mmol/L (ref 22–32)
Calcium: 9.2 mg/dL (ref 8.9–10.3)
Chloride: 103 mmol/L (ref 98–111)
Creatinine: 1.32 mg/dL — ABNORMAL HIGH (ref 0.61–1.24)
GFR, Estimated: 52 mL/min — ABNORMAL LOW (ref >60)
Glucose, Bld: 123 mg/dL — ABNORMAL HIGH (ref 70–99)
Potassium: 5.3 mmol/L — ABNORMAL HIGH (ref 3.5–5.1)
Sodium: 139 mmol/L (ref 135–145)
Total Bilirubin: 0.7 mg/dL (ref 0.0–1.2)
Total Protein: 6 g/dL — ABNORMAL LOW (ref 6.5–8.1)

## 2024-06-11 LAB — CBC WITH DIFFERENTIAL (CANCER CENTER ONLY)
Abs Immature Granulocytes: 0.41 K/uL — ABNORMAL HIGH (ref 0.00–0.07)
Basophils Absolute: 0.1 K/uL (ref 0.0–0.1)
Basophils Relative: 1 %
Eosinophils Absolute: 0.2 K/uL (ref 0.0–0.5)
Eosinophils Relative: 1 %
HCT: 34.7 % — ABNORMAL LOW (ref 39.0–52.0)
Hemoglobin: 11 g/dL — ABNORMAL LOW (ref 13.0–17.0)
Immature Granulocytes: 4 %
Lymphocytes Relative: 12 %
Lymphs Abs: 1.4 K/uL (ref 0.7–4.0)
MCH: 32.9 pg (ref 26.0–34.0)
MCHC: 31.7 g/dL (ref 30.0–36.0)
MCV: 103.9 fL — ABNORMAL HIGH (ref 80.0–100.0)
Monocytes Absolute: 0.7 K/uL (ref 0.1–1.0)
Monocytes Relative: 6 %
Neutro Abs: 9 K/uL — ABNORMAL HIGH (ref 1.7–7.7)
Neutrophils Relative %: 76 %
Platelet Count: 189 K/uL (ref 150–400)
RBC: 3.34 MIL/uL — ABNORMAL LOW (ref 4.22–5.81)
RDW: 22.3 % — ABNORMAL HIGH (ref 11.5–15.5)
WBC Count: 11.8 K/uL — ABNORMAL HIGH (ref 4.0–10.5)
nRBC: 0.2 % (ref 0.0–0.2)

## 2024-06-11 LAB — LACTATE DEHYDROGENASE: LDH: 166 U/L (ref 105–235)

## 2024-06-11 NOTE — Progress Notes (Signed)
 [facility] [phone]   [phone]  PROGRESS NOTE  Patient Care Team: Anita Bernardino [name], FNP as PCP - General (Nurse Practitioner) Jeffrie Oneil [name], MD as PCP - Cardiology (Cardiology) Gail Favorite, MD as Consulting Physician (General Surgery)  Hematological/Oncological History # Elevated metamyelocytes # Thrombocytopenia # Macrocytosis 1) Labs from PCP, Tia McPhatter: -12/29/2020: WBC 9.8, Hgb 14.4, MCV 103.7, Plt 168,  Band Abs 0.8 (H), Myelo Abs 0.4 (H) -01/06/2021: WBC 9.8, Hgb 14.2, MCV 103.4, Plt 207, Mono Manuel Ab 1.1 (H), Meta Absolute 0.2 (H) 2) 01/21/2021: Establish care with Johnston Police PA-C  HISTORY OF PRESENTING ILLNESS:  Jeffrey Stewart 89 y.o. male returns for a follow up. Patient is accompanied by his wife for this visit. He was last seen in clinic on 05/14/2024. In the interim, he has had marked improvement in his lower extremity swelling.  Today, Mr. Seelig reports he is breathing well and is not having any lightheadedness or dizziness.  He reports that he is elevating his feet and wearing compression socks and had marked improvement in the lower extremity swelling.  Additionally he continues to take his prednisone  5 mg in the morning and 1 mg at night.  He reports that he has had no issues with runny nose, sore throat, cough.  He denies any bleeding, bruising, or dark stools.  He reports that he has had no other overt signs of blood loss.  He notes that his appetite is strong and he is looking forward to Thanksgiving this year.  He said no infectious symptom such as runny nose, sore throat, cough.  A full 10 point ROS is otherwise negative.  MEDICAL HISTORY:  Past Medical History:  Diagnosis Date   Allergy    Arthritis    MINOR ARTHRITIS FEET AND TOES   Asthma    Cataract    CHF (congestive heart failure) (HCC) 11/14/2009   EF45-50%   Coronary artery disease    Essential hypertension 05/07/2024   Glaucoma (increased eye  pressure) 04/06/2024   Hyperlipidemia    Myocardial infarction (HCC)    Pneumonia    HX OF PNEUMONIA 4 OR 5 YRS AGO   Rosacea    OF FACE   Shortness of breath    SOMETIMES SOB OR WHEEZING WITH EXERTION   Symptomatic anemia 04/18/2024    SURGICAL HISTORY: Past Surgical History:  Procedure Laterality Date   APPENDECTOMY     AGE 69   CARDIAC CATHETERIZATION  11/2009   mild / mod cad,MODERATE TIGHT STENOSIS IN THE DISTAL LEFT CIRCUMFLEX ARTERY   ELECTROCARDIOGRAM  11/2009   RBBB   ESOPHAGOGASTRODUODENOSCOPY N/A 04/15/2024   Procedure: EGD (ESOPHAGOGASTRODUODENOSCOPY);  Surgeon: Eartha Flavors, Toribio, MD;  Location: [facility];  Service: Gastroenterology;  Laterality: N/A;   EYE SURGERY     BILATERAL CATARACT EXTRACT EXTRACTION WITH IMPLANTS   INGUINAL HERNIA REPAIR Bilateral 09/29/2013   Procedure: LAPAROSCOPIC BILATERAL  INGUINAL HERNIA;  Surgeon: Favorite CHRISTELLA Gail, MD;  Location: [facility];  Service: General;  Laterality: Bilateral;   INSERTION OF MESH Bilateral 09/29/2013   Procedure: INSERTION OF MESH;  Surgeon: Favorite CHRISTELLA Gail, MD;  Location: [facility];  Service: General;  Laterality: Bilateral;   INTRAMEDULLARY (IM) NAIL INTERTROCHANTERIC Right 04/07/2024   Procedure: FIXATION, FRACTURE, INTERTROCHANTERIC, WITH INTRAMEDULLARY ROD;  Surgeon: Georgina Ozell LABOR, MD;  Location: [facility];  Service: Orthopedics;  Laterality: Right;   last echo 02/2009     last nuc 2009  12/16/2007   EF 33%.  ABNORMAL. HE HAS APICAL DEFECT CONSISTENT WITH A SCAR.LV FUNCTION MODERATELY  DEPRESSED   US  ECHOCARDIOGRAPHY  02/23/2009   EF 45-50%   US  ECHOCARDIOGRAPHY  12/27/2007   EF 45-50%    SOCIAL HISTORY: Social History   Socioeconomic History   Marital status: Married    Spouse name: Not on file   Number of children: Not on file   Years of education: Not on file   Highest education level: Not on file  Occupational History   Not on file  Tobacco Use   Smoking status: Never   Smokeless tobacco: Never   Vaping Use   Vaping status: Never Used  Substance and Sexual Activity   Alcohol  use: Not Currently    Comment: 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   Drug use: No   Sexual activity: Not on file  Other Topics Concern   Not on file  Social History Narrative   Not on file   Social Drivers of Health   Financial Resource Strain: Not on file  Food Insecurity: Low Risk  (06/04/2024)   Received from [facility]   Hunger Vital Sign    Within the past 12 months, you worried that your food would run out before you got money to buy more: Never true    Within the past 12 months, the food you bought just didn't last and you didn't have money to get more. : Never true  Transportation Needs: No Transportation Needs (06/04/2024)   Received from [facility]    In the past 12 months, has lack of reliable transportation kept you from medical appointments, meetings, work or from getting things needed for daily living? : No  Physical Activity: Not on file  Stress: Not on file  Social Connections: Moderately Integrated (05/06/2024)   Social Connection and Isolation Panel    Frequency of Communication with Friends and Family: More than three times a week    Frequency of Social Gatherings with Friends and Family: More than three times a week    Attends Religious Services: 1 to 4 times per year    Active Member of [employer] or Organizations: No    Attends Banker Meetings: Never    Marital Status: Married  Catering Manager Violence: Unknown (05/06/2024)   Humiliation, Afraid, Rape, and Kick questionnaire    Fear of Current or Ex-Partner: No    Emotionally Abused: No    Physically Abused: Not on file    Sexually Abused: No    FAMILY HISTORY: Family History  Problem Relation Age of Onset   Diabetes Sister    Lung cancer Sister        smoker    ALLERGIES:  is allergic to niaspan  [niacin ] and meloxicam.  MEDICATIONS:  Current Outpatient Medications  Medication Sig  Dispense Refill   albuterol  (VENTOLIN  HFA) 108 (90 Base) MCG/ACT inhaler Inhale 2 puffs into the lungs every 4 (four) hours as needed for wheezing or shortness of breath. 1 each 1   apixaban  (ELIQUIS ) 5 MG TABS tablet Take 2 tablets (10 mg total) by mouth 2 (two) times daily for 2 days, THEN 1 tablet (5 mg total) 2 (two) times daily. (Patient taking differently: 5 mg bid) 120 tablet 0   atorvastatin  (LIPITOR) 40 MG tablet Take 1 tablet (40 mg total) by mouth at bedtime. 90 tablet 2   brimonidine  (ALPHAGAN ) 0.2 % ophthalmic solution Place 1 drop into both eyes 2 (two) times daily.     carvedilol  (  COREG ) 6.25 MG tablet Take 1 tablet (6.25 mg total) by mouth 2 (two) times daily with a meal. 180 tablet 2   cetirizine (ZYRTEC) 10 MG tablet Take 10 mg by mouth at bedtime.     Cholecalciferol  (VITAMIN D3) 1000 units CAPS Take 1,000 Units by mouth daily.     empagliflozin  (JARDIANCE ) 10 MG TABS tablet Take 1 tablet (10 mg total) by mouth daily. 30 tablet 0   ferrous sulfate  325 (65 FE) MG tablet Take 1 tablet (325 mg total) by mouth daily with breakfast. 60 tablet 1   fish oil-omega-3 fatty acids  1000 MG capsule Take 1 g by mouth 2 (two) times daily.     fluticasone  (FLONASE ) 50 MCG/ACT nasal spray Place 1-2 sprays into both nostrils daily as needed for allergies or rhinitis.     fluticasone -salmeterol (ADVAIR) 100-50 MCG/ACT AEPB Inhale 1 puff into the lungs 2 (two) times daily.     glucosamine-chondroitin 500-400 MG tablet Take 1 tablet by mouth at bedtime.     ipratropium-albuterol  (DUONEB) 0.5-2.5 (3) MG/3ML SOLN Take 3 mLs by nebulization every 6 (six) hours as needed (wheezing, shortness of breath).     latanoprost  (XALATAN ) 0.005 % ophthalmic solution Place 1 drop into both eyes at bedtime.     loperamide  (IMODIUM ) 2 MG capsule Take 2 mg by mouth 2 (two) times daily as needed for diarrhea or loose stools.     metroNIDAZOLE  (METROCREAM ) 0.75 % cream Apply 1 application  topically 2 (two) times daily  as needed (for rosacea).     mycophenolate  (CELLCEPT ) 500 MG tablet TAKE 1 TABLET BY MOUTH TWICE A DAY 180 tablet 1   NEOMYCIN -POLYMYXIN-HYDROCORTISONE  (CORTISPORIN) 1 % SOLN OTIC solution Place 1 drop into both ears as needed (when ears are stopped up).     pantoprazole  (PROTONIX ) 40 MG tablet Take 1 tablet (40 mg total) by mouth 2 (two) times daily. 60 tablet 1   potassium chloride  SA (KLOR-CON  M) 20 MEQ tablet Take 1 tablet (20 mEq total) by mouth daily. 90 tablet 3   predniSONE  (DELTASONE ) 1 MG tablet Take 1 tablet (1 mg total) by mouth at bedtime. Restart after finished with prednisone  taper     predniSONE  (DELTASONE ) 5 MG tablet Take 1 tablet (5 mg total) by mouth daily with breakfast. Restart after finished with prednisone  taper     sodium chloride  (OCEAN) 0.65 % nasal spray Place 1 spray into the nose as needed for congestion.     spironolactone  (ALDACTONE ) 25 MG tablet Take 1 tablet (25 mg total) by mouth daily. 60 tablet 0   torsemide  (DEMADEX ) 20 MG tablet Take 2 tablets (40 mg total) by mouth daily. 120 tablet 0   No current facility-administered medications for this visit.    REVIEW OF SYSTEMS:   Constitutional: ( - ) fevers, ( - )  chills , ( - ) night sweats Eyes: ( - ) blurriness of vision, ( - ) double vision, ( - ) watery eyes Ears, nose, mouth, throat, and face: ( - ) mucositis, ( - ) sore throat Respiratory: ( -) cough, ( - ) dyspnea, ( -) wheezes  Cardiovascular: ( - ) palpitation, ( - ) chest discomfort, ( + ) lower extremity swelling Gastrointestinal:  ( - ) nausea, ( - ) heartburn, ( - ) change in bowel habits Skin: ( - ) abnormal skin rashes Lymphatics: ( - ) new lymphadenopathy, ( - ) easy bruising Neurological: ( - ) numbness, ( - ) tingling, ( - )  new weaknesses Behavioral/Psych: ( - ) mood change, ( - ) new changes  All other systems were reviewed with the patient and are negative.  PHYSICAL EXAMINATION: ECOG PERFORMANCE STATUS: 1 - Symptomatic but  completely ambulatory  Vitals:   06/11/24 1523  BP: 109/71  Pulse: 73  Resp: 14  Temp: 97.7 F (36.5 C)  SpO2: 100%    Filed Weights   06/11/24 1523  Weight: 185 lb 6.4 oz (84.1 kg)     GENERAL: well appearing male in NAD  SKIN: skin color, texture, turgor are normal, no rashes or significant lesions. Bruising over both arms.  EYES: conjunctiva are pink and non-injected, sclera clear LUNGS: clear to auscultation and percussion with normal breathing effort HEART: regular rate & rhythm and no murmurs. Right below the knee pitting edema with venous stasis.  Musculoskeletal: no cyanosis of digits and no clubbing  PSYCH: alert & oriented x 3, fluent speech NEURO: no focal motor/sensory deficits  LABORATORY DATA:  I have reviewed the data as listed    Latest Ref Rng & Units 06/11/2024    2:53 PM 05/14/2024    9:04 AM 05/11/2024    1:55 AM  CBC  WBC 4.0 - 10.5 K/uL 11.8  20.4  12.6   Hemoglobin 13.0 - 17.0 g/dL 88.9  88.8  89.3   Hematocrit 39.0 - 52.0 % 34.7  34.6  33.3   Platelets 150 - 400 K/uL 189  377  242        Latest Ref Rng & Units 06/11/2024    2:53 PM 05/14/2024    9:04 AM 05/11/2024    1:55 AM  CMP  Glucose 70 - 99 mg/dL 876  858  866   BUN 8 - 23 mg/dL 40  48  37   Creatinine 0.61 - 1.24 mg/dL 8.67  8.47  8.57   Sodium 135 - 145 mmol/L 139  136  136   Potassium 3.5 - 5.1 mmol/L 5.3  4.3  4.7   Chloride 98 - 111 mmol/L 103  99  97   CO2 22 - 32 mmol/L 28  29  27    Calcium  8.9 - 10.3 mg/dL 9.2  8.9  8.7   Total Protein 6.5 - 8.1 g/dL 6.0  6.1    Total Bilirubin 0.0 - 1.2 mg/dL 0.7  1.0    Alkaline Phos 38 - 126 U/L 88  118    AST 15 - 41 U/L 18  12    ALT 0 - 44 U/L 9  14      ASSESSMENT & PLAN Demetry A Oguin is a 89 y.o. male that presents to the clinic for a follow up for the following:  #Right Leg Edema: -- Previously noted to have marked edema noted between the knee.  -- Ultrasound the lower extremities did not reveal evidence of DVT --  Solid improvement with elevation, compression socks, and diuresis -- Continue to monitor  #Leukocytosis, neutrophil predominant: --Patient is current taking prednisone  6 mg/day which is contributing to his leukocytosis --Monitor closely for any infectious symptoms including fevers and chills  #Anemia: --Differentials include CKD, chronic disease/inflammation --Patient is not taking any medication that could cause macrocytosis --Patient has not consumed alcohol  recently but reports prior heavy consumption --Recent labs from 04/12/2024 showed no evidence of iron, vitamin B12 or folate deficiency. --Labs today show white blood cell 11.8, hemoglobin 11.0, MCV 103.9, platelets 189 -- Have considered bone marrow biopsy but levels overall are steady.  If there  is a sudden decline in the blood counts would recommend reconsideration of bone marrow biopsy  #Thrombocytopenia--resolved: -Mild and levels oscillate back and forth to normal. Today's Platelet count is 189K -Record review shows CT scan from 08/28/2013 that shows mild splenomegaly.  -Abdominal US  from 11/04/2021 confirmed continued splenomegaly with no overt liver disease.   Follow up: --RTC in 12 weeks to re-assess  No orders of the defined types were placed in this encounter.  All questions were answered. The patient knows to call the clinic with any problems, questions or concerns.  I have spent a total of 30 minutes minutes of face-to-face and non-face-to-face time, preparing to see the patient, performing a medically appropriate examination, counseling and educating the patient, ordering tests, documenting clinical information in the electronic health record,and care coordination.   Norleen IVAR Kidney, MD Department of Hematology/Oncology [facility] Phone: [phone] Pager: [phone] Email: [email or web address]

## 2024-06-16 ENCOUNTER — Encounter: Payer: Self-pay | Admitting: Podiatry

## 2024-06-16 ENCOUNTER — Ambulatory Visit: Admitting: Podiatry

## 2024-06-16 DIAGNOSIS — B351 Tinea unguium: Secondary | ICD-10-CM

## 2024-06-16 DIAGNOSIS — L600 Ingrowing nail: Secondary | ICD-10-CM

## 2024-06-16 DIAGNOSIS — M79672 Pain in left foot: Secondary | ICD-10-CM | POA: Diagnosis not present

## 2024-06-16 DIAGNOSIS — M79671 Pain in right foot: Secondary | ICD-10-CM

## 2024-06-16 NOTE — Progress Notes (Signed)
 Patient presents for evaluation and treatment of tenderness and some redness around nails feet.  Tenderness around toes with walking and wearing shoes.  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  Pain around the great toenails.  Physical exam:  General appearance: Alert, pleasant, and in no acute distress.  Vascular: Pedal pulses: DP 1/4 B/L, PT 0/4 B/L.  Severe edema lower legs bilaterally.  Capillary refill time immediate bilaterally  Neurologic: Light touch intact bilaterally diminished Achilles tendon reflex bilaterally.  Dermatologic:  Nails thickened, disfigured, discolored 1-5 BL with subungual debris.  Redness and hypertrophic nail folds along nail folds bilaterally but no signs of drainage or infection.  Skin thin and atrophic with no hair growth lower extremity.  Favor areas of hyperpigmentation bilaterally.  Musculoskeletal:  Hammertoes 2 through 5 bilaterally.  Normal muscle strength lower extremity bilaterally   Diagnosis: 1. Painful onychomycotic nails 1 through 5 bilaterally. 2. Pain toes 1 through 5 bilaterally. 3.  Ingrown nails bilaterally  Plan: -New patient office visit for evaluation and management level 3.  Modifier 25. - Discussed with him the dystrophic nails with fungus recommend periodic debridement every 3 months.  Discussed with him the great toenails which are extremely thickened dystrophic and ingrown.  Nothing to be a good candidate for nail surgery on these would just recommend periodic debridement of them watch for any signs of infection borders.  -Debrided onychomycotic nails 1 through 5 bilaterally.  Sharply debrided nails with nail clipper and reduced with a power bur.  Return 3 months RFC

## 2024-06-27 ENCOUNTER — Other Ambulatory Visit (HOSPITAL_BASED_OUTPATIENT_CLINIC_OR_DEPARTMENT_OTHER): Payer: Self-pay | Admitting: Otolaryngology

## 2024-06-27 DIAGNOSIS — K118 Other diseases of salivary glands: Secondary | ICD-10-CM

## 2024-06-30 ENCOUNTER — Ambulatory Visit (HOSPITAL_BASED_OUTPATIENT_CLINIC_OR_DEPARTMENT_OTHER): Admission: RE | Admit: 2024-06-30 | Discharge: 2024-06-30 | Attending: Otolaryngology | Admitting: Otolaryngology

## 2024-06-30 DIAGNOSIS — K118 Other diseases of salivary glands: Secondary | ICD-10-CM | POA: Diagnosis present

## 2024-07-07 ENCOUNTER — Ambulatory Visit: Admitting: Orthopedic Surgery

## 2024-07-07 ENCOUNTER — Telehealth: Payer: Self-pay | Admitting: Cardiology

## 2024-07-07 ENCOUNTER — Other Ambulatory Visit (INDEPENDENT_AMBULATORY_CARE_PROVIDER_SITE_OTHER): Payer: Self-pay

## 2024-07-07 DIAGNOSIS — M25551 Pain in right hip: Secondary | ICD-10-CM

## 2024-07-07 MED ORDER — SPIRONOLACTONE 25 MG PO TABS: 25.0000 mg | ORAL_TABLET | Freq: Every day | ORAL | 1 refills | Status: AC

## 2024-07-07 NOTE — Telephone Encounter (Signed)
" °*  STAT* If patient is at the pharmacy, call can be transferred to refill team.   1. Which medications need to be refilled? (please list name of each medication and dose if known)   spironolactone  (ALDACTONE ) 25 MG tablet   2. Would you like to learn more about the convenience, safety, & potential cost savings by using the [facility]?   3. Are you open to using the [facility] (Type [facility]. ).  4. Which pharmacy/location (including street and city if local pharmacy) is medication to be sent to?  [id number] - [place], [place] - [address]   5. Do they need a 30 day or 90 day supply?   90 day  Wife stated patient only has a couple of days left of this medication.    "

## 2024-07-07 NOTE — Progress Notes (Signed)
 Orthopedic Surgery Post-operative Office Visit   Procedure: right intertrochanteric femur fracture s/p CMN Date of Surgery: 04/12/2024 (~3 months post-op)   Assessment: Patient is a 89 y.o. who is doing as expected after surgery     Plan: -Operative plans complete -Weight bearing as tolerated right lower extremity -Has a small loculated hematoma at the distal most incision. It is not bothering him so did not recommend any intervention  -Pain management: tylenol  medications as needed -Return to office in 3 months, x-rays needed at next visit: [facility]/lateral hip right hip   ___________________________________________________________________________     Subjective: Pain has gotten better. Not having any regular pain in his hip. Not taking any medications for pain. Ambulating with a cane. Has been doing better since switching the cane to the left hand.   Objective:   General: no acute distress, appropriate affect, ambulating with cane Neurologic: alert, answering questions appropriately, following commands Respiratory: unlabored breathing on room air Skin: incisions are well healed   MSK (RLE): Able to do sit to stand without assistance, EHL/TA/GSC intact, plantarflexes and dorsiflexes toes, sensation intact light touch in sural/saphenous/deep peroneal/superficial peroneal/tibial nerve distributions, foot warm and well-perfused   Imaging: XRs of the right hip from 07/07/2024 were independently reviewed and interpreted, showing an intertrochanteric femur fracture. Alignment unchanged since prior films on 05/26/2024. Lag screws were centered within the femoral head. No lucency seen around the rods or screws. No new fractures seen. No dislocation seen.      Patient name: Jeffrey Stewart Patient MRN: [id number] Date of visit: 07/07/2024

## 2024-07-07 NOTE — Telephone Encounter (Signed)
 Refills has been sent to the pharmacy.

## 2024-07-14 ENCOUNTER — Telehealth: Payer: Self-pay

## 2024-07-14 DIAGNOSIS — J849 Interstitial pulmonary disease, unspecified: Secondary | ICD-10-CM

## 2024-07-14 NOTE — Telephone Encounter (Unsigned)
 Copied from CRM [id number]. Topic: Clinical - Medication Question >> Jul 14, 2024  2:42 PM Whitney O wrote: Reason for CRM: patient wife is calling because dr theophilus he had prescribed mycophenolate  (CELLCEPT ) 500 MG tablet he told him to take 2 tablets by mouth twice daily . they had filled one tablet by mouth twice daily dont know if he should be taken the two or the one  Please give patient wife ms edna a call concerning this information [phone]

## 2024-07-15 MED ORDER — MYCOPHENOLATE MOFETIL 500 MG PO TABS: 1000.0000 mg | ORAL_TABLET | Freq: Two times a day (BID) | ORAL | 3 refills | Status: AC

## 2024-07-15 NOTE — Telephone Encounter (Signed)
 Last OV with Dr. Theophilus 03/11/24 - history includes Started on CellCept  at 500 mg/day in January 2024. Dose increased to 1gm daily in sept 2024. Assessment/Plan at that time included: Currently on CellCept  which seems to have stabilized the disease. Continue CellCept  as current regimen.   Looking back to when current Rx was changed to CellCept  500mg  BID on 09/17/23, I do not see a documented reason for this change. See refill encounter 09/15/23.   Given no reason documented for dose reduction and appears this was in error, will update Rx to reflect Dr. Jiles recommendation on 03/11/24 for CellCept  dosing.  Last labs: CBC, CMP 06/11/24.   Next OV due February 2026 - routing to scheduling team for assistance with scheduling.

## 2024-07-15 NOTE — Addendum Note (Signed)
 Addended by: Phyillis Dascoli L on: 07/15/2024 12:16 PM   Modules accepted: Orders

## 2024-09-04 ENCOUNTER — Inpatient Hospital Stay: Attending: Hematology and Oncology

## 2024-09-04 ENCOUNTER — Inpatient Hospital Stay: Admitting: Hematology and Oncology

## 2024-09-09 ENCOUNTER — Encounter

## 2024-09-09 ENCOUNTER — Ambulatory Visit: Admitting: Pulmonary Disease

## 2024-09-10 ENCOUNTER — Ambulatory Visit: Admitting: Cardiology

## 2024-09-11 ENCOUNTER — Other Ambulatory Visit

## 2024-09-16 ENCOUNTER — Ambulatory Visit: Admitting: Podiatry

## 2024-09-24 ENCOUNTER — Ambulatory Visit: Admitting: Gastroenterology

## 2024-10-06 ENCOUNTER — Ambulatory Visit: Admitting: Orthopedic Surgery
